# Patient Record
Sex: Male | Born: 1945
Health system: Southern US, Community
[De-identification: ages and names within clinical notes are randomized; demographics above are authoritative.]

## PROBLEM LIST (undated history)

## (undated) DIAGNOSIS — E119 Type 2 diabetes mellitus without complications: Secondary | ICD-10-CM

## (undated) DIAGNOSIS — N289 Disorder of kidney and ureter, unspecified: Secondary | ICD-10-CM

## (undated) DIAGNOSIS — I10 Essential (primary) hypertension: Secondary | ICD-10-CM

## (undated) DIAGNOSIS — M109 Gout, unspecified: Secondary | ICD-10-CM

## (undated) DIAGNOSIS — K219 Gastro-esophageal reflux disease without esophagitis: Secondary | ICD-10-CM

## (undated) DIAGNOSIS — E785 Hyperlipidemia, unspecified: Secondary | ICD-10-CM

## (undated) DIAGNOSIS — E669 Obesity, unspecified: Secondary | ICD-10-CM

## (undated) DIAGNOSIS — I2510 Atherosclerotic heart disease of native coronary artery without angina pectoris: Secondary | ICD-10-CM

## (undated) DIAGNOSIS — C801 Malignant (primary) neoplasm, unspecified: Secondary | ICD-10-CM

## (undated) DIAGNOSIS — N200 Calculus of kidney: Secondary | ICD-10-CM

## (undated) DIAGNOSIS — I700 Atherosclerosis of aorta: Secondary | ICD-10-CM

## (undated) DIAGNOSIS — T7840XA Allergy, unspecified, initial encounter: Secondary | ICD-10-CM

## (undated) HISTORY — DX: Atherosclerosis of aorta: I70.0

## (undated) HISTORY — DX: Calculus of kidney: N20.0

## (undated) HISTORY — DX: Hyperlipidemia, unspecified: E78.5

## (undated) HISTORY — DX: Type 2 diabetes mellitus without complications: E11.9

## (undated) HISTORY — DX: Obesity, unspecified: E66.9

## (undated) HISTORY — DX: Essential (primary) hypertension: I10

## (undated) HISTORY — DX: Allergy, unspecified, initial encounter: T78.40XA

## (undated) HISTORY — DX: Atherosclerotic heart disease of native coronary artery without angina pectoris: I25.10

## (undated) HISTORY — PX: OTHER SURGICAL HISTORY: SHX169

---

## 2005-09-24 ENCOUNTER — Ambulatory Visit: Payer: Self-pay | Admitting: Urology

## 2005-09-30 ENCOUNTER — Ambulatory Visit: Payer: Self-pay | Admitting: Urology

## 2005-10-06 ENCOUNTER — Other Ambulatory Visit: Payer: Self-pay

## 2005-10-06 ENCOUNTER — Ambulatory Visit: Payer: Self-pay | Admitting: Urology

## 2005-10-08 ENCOUNTER — Ambulatory Visit: Payer: Self-pay | Admitting: Urology

## 2005-10-14 ENCOUNTER — Ambulatory Visit: Payer: Self-pay | Admitting: Urology

## 2005-10-21 ENCOUNTER — Ambulatory Visit: Payer: Self-pay | Admitting: Urology

## 2005-10-26 ENCOUNTER — Ambulatory Visit: Payer: Self-pay | Admitting: Urology

## 2005-10-27 ENCOUNTER — Ambulatory Visit: Payer: Self-pay | Admitting: Urology

## 2005-10-29 ENCOUNTER — Ambulatory Visit: Payer: Self-pay | Admitting: Urology

## 2005-11-06 ENCOUNTER — Ambulatory Visit: Payer: Self-pay | Admitting: Urology

## 2005-11-12 ENCOUNTER — Ambulatory Visit: Payer: Self-pay | Admitting: Urology

## 2005-11-19 ENCOUNTER — Ambulatory Visit: Payer: Self-pay | Admitting: Urology

## 2006-01-06 ENCOUNTER — Ambulatory Visit: Payer: Self-pay | Admitting: Urology

## 2006-02-02 ENCOUNTER — Ambulatory Visit: Payer: Self-pay | Admitting: Urology

## 2006-02-16 ENCOUNTER — Ambulatory Visit: Payer: Self-pay | Admitting: Urology

## 2006-02-23 ENCOUNTER — Ambulatory Visit: Payer: Self-pay | Admitting: Urology

## 2006-03-04 ENCOUNTER — Ambulatory Visit: Payer: Self-pay | Admitting: Urology

## 2006-06-07 ENCOUNTER — Ambulatory Visit: Payer: Self-pay | Admitting: Urology

## 2006-09-16 ENCOUNTER — Ambulatory Visit: Payer: Self-pay | Admitting: Urology

## 2006-09-23 ENCOUNTER — Ambulatory Visit: Payer: Self-pay | Admitting: Urology

## 2006-09-27 ENCOUNTER — Ambulatory Visit: Payer: Self-pay | Admitting: Urology

## 2006-10-19 ENCOUNTER — Ambulatory Visit: Payer: Self-pay | Admitting: Urology

## 2006-11-01 ENCOUNTER — Ambulatory Visit: Payer: Self-pay | Admitting: Urology

## 2007-02-02 ENCOUNTER — Ambulatory Visit: Payer: Self-pay | Admitting: Urology

## 2007-08-03 ENCOUNTER — Ambulatory Visit: Payer: Self-pay | Admitting: Urology

## 2008-01-27 ENCOUNTER — Ambulatory Visit: Payer: Self-pay | Admitting: Urology

## 2008-02-08 ENCOUNTER — Ambulatory Visit: Payer: Self-pay | Admitting: Urology

## 2008-02-14 ENCOUNTER — Ambulatory Visit: Payer: Self-pay | Admitting: Urology

## 2008-07-05 ENCOUNTER — Ambulatory Visit: Payer: Self-pay | Admitting: Urology

## 2009-01-07 ENCOUNTER — Ambulatory Visit: Payer: Self-pay | Admitting: Urology

## 2009-07-08 ENCOUNTER — Ambulatory Visit: Payer: Self-pay | Admitting: Urology

## 2009-07-25 ENCOUNTER — Ambulatory Visit: Payer: Self-pay | Admitting: Urology

## 2009-08-01 ENCOUNTER — Ambulatory Visit: Payer: Self-pay | Admitting: Urology

## 2009-12-05 ENCOUNTER — Ambulatory Visit: Payer: Self-pay | Admitting: Urology

## 2010-06-16 ENCOUNTER — Ambulatory Visit: Payer: Self-pay | Admitting: Urology

## 2010-12-31 ENCOUNTER — Ambulatory Visit: Payer: Self-pay | Admitting: Urology

## 2011-07-06 ENCOUNTER — Ambulatory Visit: Payer: Self-pay | Admitting: Urology

## 2011-11-19 ENCOUNTER — Ambulatory Visit: Payer: Self-pay | Admitting: Urology

## 2011-11-19 DIAGNOSIS — N200 Calculus of kidney: Secondary | ICD-10-CM | POA: Insufficient documentation

## 2011-11-19 DIAGNOSIS — N138 Other obstructive and reflux uropathy: Secondary | ICD-10-CM | POA: Insufficient documentation

## 2011-11-19 DIAGNOSIS — N401 Enlarged prostate with lower urinary tract symptoms: Secondary | ICD-10-CM | POA: Insufficient documentation

## 2011-11-19 DIAGNOSIS — Z8042 Family history of malignant neoplasm of prostate: Secondary | ICD-10-CM | POA: Insufficient documentation

## 2011-11-19 DIAGNOSIS — D419 Neoplasm of uncertain behavior of unspecified urinary organ: Secondary | ICD-10-CM | POA: Insufficient documentation

## 2011-11-21 DIAGNOSIS — N23 Unspecified renal colic: Secondary | ICD-10-CM | POA: Insufficient documentation

## 2011-11-21 DIAGNOSIS — N210 Calculus in bladder: Secondary | ICD-10-CM | POA: Insufficient documentation

## 2011-11-21 DIAGNOSIS — R339 Retention of urine, unspecified: Secondary | ICD-10-CM | POA: Insufficient documentation

## 2011-11-21 DIAGNOSIS — R310 Gross hematuria: Secondary | ICD-10-CM | POA: Insufficient documentation

## 2011-11-23 ENCOUNTER — Ambulatory Visit: Payer: Self-pay | Admitting: Urology

## 2011-11-23 DIAGNOSIS — I10 Essential (primary) hypertension: Secondary | ICD-10-CM

## 2011-11-23 LAB — BASIC METABOLIC PANEL
BUN: 29 mg/dL — ABNORMAL HIGH (ref 7–18)
Chloride: 108 mmol/L — ABNORMAL HIGH (ref 98–107)
EGFR (African American): >60
EGFR (Non-African Amer.): >60
Glucose: 106 mg/dL — ABNORMAL HIGH (ref 65–99)
Osmolality: 288 (ref 275–301)
Potassium: 4.2 mmol/L (ref 3.5–5.1)
Sodium: 141 mmol/L (ref 136–145)

## 2011-11-23 LAB — CBC WITH DIFFERENTIAL/PLATELET
Basophil %: 0.7 %
Eosinophil #: 0.2 10*3/uL (ref 0.0–0.7)
Eosinophil %: 2.1 %
HCT: 42.2 % (ref 40.0–52.0)
HGB: 14.3 g/dL (ref 13.0–18.0)
Lymphocyte #: 2.9 10*3/uL (ref 1.0–3.6)
MCH: 31.4 pg (ref 26.0–34.0)
MCHC: 33.9 g/dL (ref 32.0–36.0)
MCV: 93 fL (ref 80–100)
Monocyte #: 0.5 x10 3/mm (ref 0.2–1.0)
Neutrophil #: 4.7 10*3/uL (ref 1.4–6.5)
RDW: 14.7 % — ABNORMAL HIGH (ref 11.5–14.5)

## 2011-11-24 ENCOUNTER — Ambulatory Visit: Payer: Self-pay | Admitting: Urology

## 2012-09-22 ENCOUNTER — Ambulatory Visit: Payer: Self-pay | Admitting: Urology

## 2013-06-16 ENCOUNTER — Ambulatory Visit: Payer: Self-pay | Admitting: Urology

## 2013-06-16 DIAGNOSIS — N182 Chronic kidney disease, stage 2 (mild): Secondary | ICD-10-CM | POA: Insufficient documentation

## 2013-06-26 ENCOUNTER — Ambulatory Visit: Payer: Self-pay | Admitting: Urology

## 2013-07-06 ENCOUNTER — Ambulatory Visit: Payer: Self-pay | Admitting: Urology

## 2013-07-17 ENCOUNTER — Ambulatory Visit: Payer: Self-pay | Admitting: Urology

## 2014-01-04 ENCOUNTER — Ambulatory Visit: Payer: Self-pay | Admitting: Family Medicine

## 2014-01-14 ENCOUNTER — Ambulatory Visit: Payer: Self-pay | Admitting: Family Medicine

## 2014-07-03 NOTE — Op Note (Signed)
PATIENT NAME:  Donald Mcdowell, Donald Mcdowell MR#:  [id number] DATE OF BIRTH:  Aug 09, 1945  DATE OF PROCEDURE:  11/24/2011  PREOPERATIVE DIAGNOSIS: Bladder calculi.   POSTOPERATIVE DIAGNOSIS: Bladder calculi.   PROCEDURES:  1. Cystoscopy. 2. Laser litholapaxy.   SURGEON: Denice Bors. Jacqlyn Larsen, MD   ANESTHESIA: General endotracheal anesthesia.   INDICATIONS: The patient is a 69 year old gentleman with a long history of nephrolithiasis. He was noted to have several stones in the left kidney at the time of his previous evaluation. He recently began experiencing left-sided flank pain with increased urinary frequency and hematuria. A subsequent KUB demonstrated two stones within the urinary bladder. Both were approximately 1 cm in size. He presents for cysto and laser litholapaxy.   DESCRIPTION OF PROCEDURE: After informed consent was obtained, the patient was taken to the operating room and placed in the dorsal lithotomy position under general endotracheal anesthesia. The patient was then prepped and draped in the usual standard fashion. The 52 French rigid cystoscope was introduced into the urethra under direct vision with no urethral abnormalities noted. Moderate bilobar prostatic hypertrophy was noted with small median lobe formation. Upon entering the bladder, two stones were visualized, one approximately 1.2 cm in size; the other was approximately 1 cm in size. Both were on the bladder base. The left ureteral orifice was noted to be dilated and erythematous consistent with recent stone passage. The right ureteral orifice was normal in appearance. The holmium laser fiber was then utilized to fragment the stone into multiple smaller pieces. These were then irrigated free from the bladder. Once the stone fragments were completely evacuated, the bladder was drained. The cystoscope was removed. The patient was returned to the supine position and awakened from general endotracheal anesthesia. He was taken to the recovery room  in stable condition. There were no problems or complications.   The patient tolerated the procedure well. The stones were collected and will be sent for stone analysis.   ____________________________ Denice Bors Jacqlyn Larsen, MD bsc:drc D: 11/24/2011 12:51:05 ET T: 11/24/2011 13:01:29 ET JOB#: [id number]  cc: Denice Bors. Jacqlyn Larsen, MD, <Dictator> Denice Bors Shayla Heming MD ELECTRONICALLY SIGNED 11/26/2011 8:08

## 2014-09-12 DIAGNOSIS — N201 Calculus of ureter: Secondary | ICD-10-CM | POA: Insufficient documentation

## 2014-11-26 ENCOUNTER — Ambulatory Visit (INDEPENDENT_AMBULATORY_CARE_PROVIDER_SITE_OTHER): Payer: Medicare Other | Admitting: Family Medicine

## 2014-11-26 ENCOUNTER — Encounter: Payer: Self-pay | Admitting: Family Medicine

## 2014-11-26 VITALS — BP 130/70 | HR 110 | Temp 99.3°F | Resp 18 | Ht 71.0 in | Wt 290.5 lb

## 2014-11-26 DIAGNOSIS — N410 Acute prostatitis: Secondary | ICD-10-CM | POA: Diagnosis not present

## 2014-11-26 DIAGNOSIS — R300 Dysuria: Secondary | ICD-10-CM | POA: Diagnosis not present

## 2014-11-26 LAB — POCT URINALYSIS DIPSTICK
Bilirubin, UA: NEGATIVE
Glucose, UA: NEGATIVE
NITRITE UA: NEGATIVE
PH UA: 5
Spec Grav, UA: 1.015
UROBILINOGEN UA: 0.2

## 2014-11-26 MED ORDER — CIPROFLOXACIN HCL 500 MG PO TABS: 500.0000 mg | ORAL_TABLET | Freq: Two times a day (BID) | ORAL | Status: DC

## 2014-11-26 NOTE — Patient Instructions (Signed)

## 2014-11-26 NOTE — Progress Notes (Signed)
Name: Donald Mcdowell.   MRN: [id number]    DOB: May 28, 1945   Date:11/26/2014       Progress Note  Subjective  Chief Complaint  Chief Complaint  Patient presents with  . Urinary Frequency    burning since Friday. Just had kidney stone procedure with Dr. Jacqlyn Larsen on July 2016    Urinary Frequency  Associated symptoms include flank pain, frequency and urgency. Pertinent negatives include no chills, hematuria, nausea or vomiting.   Urinary frequency over the past several days along with every urination which is worsening. He is also having some problem with incontinence and dysuria as well as burning in the perineal area. There's been no documented fever or chills. There is no history of pyelonephritis. There is a history of numerous renal calculi as well as a mass in the bladder as well as prostate stones. He seen urologist on a regular basis this last surgical procedure or urological procedure was done by the urologist [id number]. He is also diabetic and has not been following up regularly for diabetic rechecks  Past Medical History  Diagnosis Date  . Allergy   . Diabetes mellitus without complication   . Hyperlipidemia   . Hypertension   . Kidney stones     Social History  Substance Use Topics  . Smoking status: Former Research scientist (life sciences)  . Smokeless tobacco: Not on file  . Alcohol Use: No     Current outpatient prescriptions:  .  allopurinol (ZYLOPRIM) 300 MG tablet, Take 300 mg by mouth., Disp: , Rfl:  .  glipiZIDE (GLUCOTROL XL) 10 MG 24 hr tablet, Take 10 mg by mouth., Disp: , Rfl:  .  hydrochlorothiazide (HYDRODIURIL) 25 MG tablet, Take 25 mg by mouth., Disp: , Rfl:  .  metFORMIN (GLUCOPHAGE) 1000 MG tablet, Take 1,000 mg by mouth., Disp: , Rfl:  .  quinapril (ACCUPRIL) 20 MG tablet, Take 20 mg by mouth., Disp: , Rfl:  .  rosuvastatin (CRESTOR) 20 MG tablet, Take 20 mg by mouth., Disp: , Rfl:  .  tamsulosin (FLOMAX) 0.4 MG CAPS capsule, Take 0.4 mg by mouth., Disp: , Rfl:  .  tiotropium  (SPIRIVA) 18 MCG inhalation capsule, Place 18 mcg into inhaler and inhale daily., Disp: , Rfl:   No Known Allergies  Review of Systems  Constitutional: Positive for malaise/fatigue. Negative for fever, chills and weight loss.  HENT: Negative for congestion, hearing loss, sore throat and tinnitus.   Eyes: Negative for blurred vision, double vision and redness.  Respiratory: Negative for cough, hemoptysis and shortness of breath.   Cardiovascular: Negative for chest pain, palpitations, orthopnea, claudication and leg swelling.  Gastrointestinal: Positive for abdominal pain. Negative for heartburn, nausea, vomiting, diarrhea, constipation and blood in stool.  Genitourinary: Positive for dysuria, urgency, frequency and flank pain. Negative for hematuria.  Musculoskeletal: Negative for myalgias, back pain, joint pain, falls and neck pain.  Skin: Negative for itching.  Neurological: Positive for weakness. Negative for dizziness, tingling, tremors, focal weakness, seizures, loss of consciousness and headaches.  Endo/Heme/Allergies: Does not bruise/bleed easily.  Psychiatric/Behavioral: Negative for depression and substance abuse. The patient is not nervous/anxious and does not have insomnia.      Objective  Filed Vitals:   11/26/14 1333  BP: 130/70  Pulse: 110  Temp: 99.3 F (37.4 C)  TempSrc: Oral  Resp: 18  Height: '5\' 11"'$  (1.803 m)  Weight: 290 lb 8 oz (131.77 kg)  SpO2: 93%     Physical Exam  Constitutional: He is oriented  to person, place, and time and well-developed, well-nourished, and in no distress.  Morbidly obese and in modest distress  HENT:  Head: Normocephalic.  Eyes: EOM are normal. Pupils are equal, round, and reactive to light.  Neck: Normal range of motion. Neck supple. No thyromegaly present.  Cardiovascular: Normal rate, regular rhythm and normal heart sounds.   No murmur heard. Pulmonary/Chest: Effort normal. No respiratory distress. He has wheezes.  Wheezes  noted particularly at the bases with expiration  Abdominal: Soft. Bowel sounds are normal.  Genitourinary: Penis normal.  Prostate is modestly enlarged and warm to touch and tender.  Musculoskeletal: Normal range of motion. He exhibits no edema.  Lymphadenopathy:    He has no cervical adenopathy.  Neurological: He is alert and oriented to person, place, and time. No cranial nerve deficit. Gait normal. Coordination normal.  Skin: Skin is warm and dry. No rash noted.  Psychiatric: Affect and judgment normal.      Assessment & Plan    1. Acute prostatitis Cipro 500 mg twice a day  2. Dysuria  - POCT urinalysis dipstick - Urine Culture

## 2014-11-27 LAB — URINE CULTURE: Organism ID, Bacteria: NO GROWTH

## 2014-11-28 ENCOUNTER — Telehealth: Payer: Self-pay | Admitting: Emergency Medicine

## 2014-11-28 NOTE — Telephone Encounter (Signed)
Patient notified

## 2014-12-07 ENCOUNTER — Telehealth: Payer: Self-pay | Admitting: Family Medicine

## 2014-12-07 NOTE — Telephone Encounter (Signed)
Patient is needing authorization to get a refill on Cipro '500mg'$ . Please send 2week supply to [facility]

## 2014-12-10 ENCOUNTER — Other Ambulatory Visit: Payer: Self-pay | Admitting: Family Medicine

## 2014-12-11 ENCOUNTER — Ambulatory Visit: Payer: Medicare Other | Admitting: Family Medicine

## 2014-12-18 ENCOUNTER — Encounter: Payer: Self-pay | Admitting: Family Medicine

## 2014-12-18 ENCOUNTER — Ambulatory Visit (INDEPENDENT_AMBULATORY_CARE_PROVIDER_SITE_OTHER): Payer: Medicare Other | Admitting: Family Medicine

## 2014-12-18 VITALS — BP 118/68 | HR 109 | Temp 98.4°F | Resp 16 | Ht 71.0 in | Wt 290.0 lb

## 2014-12-18 DIAGNOSIS — E1129 Type 2 diabetes mellitus with other diabetic kidney complication: Secondary | ICD-10-CM | POA: Diagnosis not present

## 2014-12-18 DIAGNOSIS — I10 Essential (primary) hypertension: Secondary | ICD-10-CM | POA: Diagnosis not present

## 2014-12-18 DIAGNOSIS — E785 Hyperlipidemia, unspecified: Secondary | ICD-10-CM

## 2014-12-18 DIAGNOSIS — N3000 Acute cystitis without hematuria: Secondary | ICD-10-CM

## 2014-12-18 DIAGNOSIS — Z23 Encounter for immunization: Secondary | ICD-10-CM | POA: Diagnosis not present

## 2014-12-18 LAB — POCT URINALYSIS DIPSTICK
BILIRUBIN UA: NEGATIVE
Glucose, UA: NEGATIVE
Ketones, UA: POSITIVE
LEUKOCYTES UA: NEGATIVE
NITRITE UA: NEGATIVE
PH UA: 5
RBC UA: NEGATIVE
Spec Grav, UA: 1.01
UROBILINOGEN UA: NEGATIVE

## 2014-12-18 LAB — GLUCOSE, POCT (MANUAL RESULT ENTRY): POC GLUCOSE: 131 mg/dL — AB (ref 70–99)

## 2014-12-18 LAB — POCT GLYCOSYLATED HEMOGLOBIN (HGB A1C): HEMOGLOBIN A1C: 6.6

## 2014-12-18 LAB — POCT UA - MICROALBUMIN: Microalbumin Ur, POC: 20 mg/L

## 2014-12-18 NOTE — Progress Notes (Signed)
Name: Donald Mcdowell.   MRN: [id number]    DOB: 1945/12/21   Date:12/18/2014       Progress Note  Subjective  Chief Complaint  Chief Complaint  Patient presents with  . Diabetes    HPI  Diabetes  Patient presents for follow-up of diabetes which is present for over 5 years. Is currently on a regimen of over 5 years. Patient states intermittent compliance with their diet and exercise. There's been no hypoglycemic episodes and there is no polyuria polydipsia polyphagia. His average fasting glucoses been in the low around 120 to 1:30 with a high around 150 . There is no end organ disease.  Last diabetic eye exam was less than a year ago.   Last visit with dietitian was greater than a year ago. Last microalbumin was obtained today and is normal at 20 .   Hypertension   Patient presents for follow-up of hypertension. It has been present for over over 5 years.  Patient states that there is compliance with medical regimen which consists of quinapril 20 mg daily and hydrochlorothiazide 25 mg daily . There is no end organ disease. Cardiac risk factors include hypertension hyperlipidemia and diabetes.  Exercise regimen consist of minimal .  Diet consist of noncompliance .  Hyperlipidemia  Patient has a history of hyperlipidemia for over 5 years.  Current medical regimen consist of Crestor 20 mg daily at bedtime .  Compliance is good .  Diet and exercise are currently followed poorly .  Risk factors for cardiovascular disease include hyperlipidemia , hypertension, diabetes, obesity, sedentary lifestyle .   There have been no side effects from the medication.    COPD history of present illness  Obesity  Patient has a history of obesity for many many years.  Attempts at weight loss have included diet and exercise and these have been followed. .  Results of this regimen  have been not very effective .  Patient now voices and interest in weight loss by  nothing .     Past Medical History   Diagnosis Date  . Allergy   . Diabetes mellitus without complication ([place])   . Hyperlipidemia   . Hypertension   . Kidney stones     Social History  Substance Use Topics  . Smoking status: Former Research scientist (life sciences)  . Smokeless tobacco: Not on file  . Alcohol Use: No     Current outpatient prescriptions:  .  allopurinol (ZYLOPRIM) 300 MG tablet, Take 300 mg by mouth., Disp: , Rfl:  .  ciprofloxacin (CIPRO) 500 MG tablet, TAKE ONE TABLET BY MOUTH TWICE DAILY, Disp: 28 tablet, Rfl: 0 .  glipiZIDE (GLUCOTROL XL) 10 MG 24 hr tablet, Take 10 mg by mouth., Disp: , Rfl:  .  hydrochlorothiazide (HYDRODIURIL) 25 MG tablet, Take 25 mg by mouth., Disp: , Rfl:  .  metFORMIN (GLUCOPHAGE) 1000 MG tablet, Take 1,000 mg by mouth., Disp: , Rfl:  .  quinapril (ACCUPRIL) 20 MG tablet, Take 20 mg by mouth., Disp: , Rfl:  .  rosuvastatin (CRESTOR) 20 MG tablet, Take 20 mg by mouth., Disp: , Rfl:  .  tamsulosin (FLOMAX) 0.4 MG CAPS capsule, Take 0.4 mg by mouth., Disp: , Rfl:  .  tiotropium (SPIRIVA) 18 MCG inhalation capsule, Place 18 mcg into inhaler and inhale daily., Disp: , Rfl:   No Known Allergies  Review of Systems  Constitutional: Negative for fever, chills and weight loss.  HENT: Positive for congestion. Negative for hearing loss, sore throat  and tinnitus.   Eyes: Negative for blurred vision, double vision and redness.  Respiratory: Positive for cough and wheezing. Negative for hemoptysis and shortness of breath.   Cardiovascular: Negative for chest pain, palpitations, orthopnea, claudication and leg swelling.  Gastrointestinal: Negative for heartburn, nausea, vomiting, diarrhea, constipation and blood in stool.  Genitourinary: Negative for dysuria, urgency, frequency and hematuria.       Recurrent renal stones for which she is followed by urologist  Musculoskeletal: Positive for joint pain. Negative for myalgias, back pain, falls and neck pain.  Skin: Negative for itching.  Neurological: Negative  for dizziness, tingling, tremors, focal weakness, seizures, loss of consciousness, weakness and headaches.  Endo/Heme/Allergies: Does not bruise/bleed easily.  Psychiatric/Behavioral: Negative for depression and substance abuse. The patient is nervous/anxious. The patient does not have insomnia.      Objective  Filed Vitals:   12/18/14 0859  BP: 118/68  Pulse: 109  Temp: 98.4 F (36.9 C)  Resp: 16  Height: '5\' 11"'$  (1.803 m)  Weight: 290 lb (131.543 kg)  SpO2: 96%     Physical Exam  Constitutional: He is oriented to person, place, and time and well-developed, well-nourished, and in no distress.  HENT:  Head: Normocephalic.  Eyes: EOM are normal. Pupils are equal, round, and reactive to light.  Neck: Normal range of motion. Neck supple. No thyromegaly present.  Cardiovascular: Normal rate, regular rhythm and normal heart sounds.   No murmur heard. Pulmonary/Chest: Effort normal and breath sounds normal. No respiratory distress. He has no wheezes.  Abdominal: Soft. Bowel sounds are normal.  Musculoskeletal: Normal range of motion. He exhibits no edema.  Lymphadenopathy:    He has no cervical adenopathy.  Neurological: He is alert and oriented to person, place, and time. No cranial nerve deficit. Gait normal. Coordination normal.  Skin: Skin is warm and dry. No rash noted.  Psychiatric: Affect and judgment normal.      Assessment & Plan  1. Type 2 diabetes mellitus with other kidney complication (HCC) Stable - POCT Glucose (CBG) - POCT HgB A1C - POCT UA - Microalbumin - Comprehensive Metabolic Panel (CMET) - Lipid Profile - TSH  2. Need for influenza vaccination Given today - Flu vaccine HIGH DOSE PF (Fluzone High dose)  3. Acute cystitis without hematuria Followed by urologist - POCT urinalysis dipstick  4. Essential hypertension Well-controlled  5. Hyperlipidemia Check lipid panel, metabolic panel  6. Morbid obesity due to excess calories  ([place])  Encourage diet and exercise with dietitian

## 2014-12-26 LAB — COMPREHENSIVE METABOLIC PANEL
ALK PHOS: 51 IU/L (ref 39–117)
ALT: 35 IU/L (ref 0–44)
AST: 25 IU/L (ref 0–40)
Albumin/Globulin Ratio: 1.7 (ref 1.1–2.5)
Albumin: 4.3 g/dL (ref 3.6–4.8)
BUN/Creatinine Ratio: 18 (ref 10–22)
BUN: 20 mg/dL (ref 8–27)
Bilirubin Total: 0.3 mg/dL (ref 0.0–1.2)
CO2: 26 mmol/L (ref 18–29)
CREATININE: 1.11 mg/dL (ref 0.76–1.27)
Calcium: 9.6 mg/dL (ref 8.6–10.2)
Chloride: 98 mmol/L (ref 97–108)
GFR calc Af Amer: 78 mL/min/{1.73_m2} (ref >59)
GFR calc non Af Amer: 68 mL/min/{1.73_m2} (ref >59)
GLOBULIN, TOTAL: 2.5 g/dL (ref 1.5–4.5)
GLUCOSE: 170 mg/dL — AB (ref 65–99)
Potassium: 5 mmol/L (ref 3.5–5.2)
SODIUM: 138 mmol/L (ref 134–144)
Total Protein: 6.8 g/dL (ref 6.0–8.5)

## 2014-12-26 LAB — LIPID PANEL
CHOLESTEROL TOTAL: 132 mg/dL (ref 100–199)
Chol/HDL Ratio: 3.1 ratio units (ref 0.0–5.0)
HDL: 42 mg/dL (ref >39)
LDL CALC: 67 mg/dL (ref 0–99)
TRIGLYCERIDES: 115 mg/dL (ref 0–149)
VLDL Cholesterol Cal: 23 mg/dL (ref 5–40)

## 2014-12-26 LAB — TSH: TSH: 1.94 u[IU]/mL (ref 0.450–4.500)

## 2014-12-28 ENCOUNTER — Telehealth: Payer: Self-pay | Admitting: Emergency Medicine

## 2014-12-28 NOTE — Telephone Encounter (Signed)
Patient notified of lab results

## 2015-02-12 ENCOUNTER — Other Ambulatory Visit: Payer: Self-pay | Admitting: Family Medicine

## 2015-04-24 ENCOUNTER — Ambulatory Visit: Payer: Medicare Other | Admitting: Family Medicine

## 2015-05-15 DIAGNOSIS — E119 Type 2 diabetes mellitus without complications: Secondary | ICD-10-CM | POA: Diagnosis not present

## 2015-05-16 ENCOUNTER — Telehealth: Payer: Self-pay | Admitting: Family Medicine

## 2015-05-16 MED ORDER — METFORMIN HCL 1000 MG PO TABS: 1000.0000 mg | ORAL_TABLET | Freq: Two times a day (BID) | ORAL | Status: DC

## 2015-05-16 MED ORDER — QUINAPRIL HCL 20 MG PO TABS: 20.0000 mg | ORAL_TABLET | Freq: Every day | ORAL | Status: DC

## 2015-05-16 MED ORDER — GLIPIZIDE ER 10 MG PO TB24: 10.0000 mg | ORAL_TABLET | Freq: Two times a day (BID) | ORAL | Status: DC

## 2015-05-16 MED ORDER — ALLOPURINOL 300 MG PO TABS: 300.0000 mg | ORAL_TABLET | Freq: Every day | ORAL | Status: DC

## 2015-05-16 NOTE — Telephone Encounter (Signed)
Medications have been refilled and sent to pahrmacy

## 2015-05-16 NOTE — Telephone Encounter (Signed)
We had to resch appointment because provider will be out of the office. Please refill all medications and send to [facility]. We had to resch appointment for May

## 2015-05-16 NOTE — Telephone Encounter (Signed)
Patient is needing refills on allopurinol, quinapril, metformin, & glipizide

## 2015-05-17 NOTE — Telephone Encounter (Signed)
Patient informed. 

## 2015-05-21 ENCOUNTER — Ambulatory Visit: Payer: Medicare Other | Admitting: Family Medicine

## 2015-07-16 ENCOUNTER — Ambulatory Visit: Payer: Medicare Other | Admitting: Family Medicine

## 2015-08-13 ENCOUNTER — Other Ambulatory Visit: Payer: Self-pay | Admitting: Family Medicine

## 2015-08-16 ENCOUNTER — Telehealth: Payer: Self-pay | Admitting: Family Medicine

## 2015-08-16 DIAGNOSIS — N210 Calculus in bladder: Secondary | ICD-10-CM | POA: Diagnosis not present

## 2015-08-16 DIAGNOSIS — Z8042 Family history of malignant neoplasm of prostate: Secondary | ICD-10-CM | POA: Diagnosis not present

## 2015-08-16 DIAGNOSIS — N401 Enlarged prostate with lower urinary tract symptoms: Secondary | ICD-10-CM | POA: Diagnosis not present

## 2015-08-16 DIAGNOSIS — R339 Retention of urine, unspecified: Secondary | ICD-10-CM | POA: Diagnosis not present

## 2015-08-16 DIAGNOSIS — N200 Calculus of kidney: Secondary | ICD-10-CM | POA: Diagnosis not present

## 2015-08-16 NOTE — Telephone Encounter (Signed)
Pt needs refills on all his medication. phamr is [facility] in [place]. Getting pt appt with other provider

## 2015-08-19 MED ORDER — GLIPIZIDE ER 10 MG PO TB24: 10.0000 mg | ORAL_TABLET | Freq: Two times a day (BID) | ORAL | Status: DC

## 2015-08-19 MED ORDER — ALLOPURINOL 300 MG PO TABS: 300.0000 mg | ORAL_TABLET | Freq: Every day | ORAL | Status: DC

## 2015-08-19 MED ORDER — METFORMIN HCL 1000 MG PO TABS: 1000.0000 mg | ORAL_TABLET | Freq: Two times a day (BID) | ORAL | Status: DC

## 2015-08-19 MED ORDER — QUINAPRIL HCL 20 MG PO TABS: 20.0000 mg | ORAL_TABLET | Freq: Every day | ORAL | Status: DC

## 2015-08-19 NOTE — Telephone Encounter (Signed)
I am only allowed to send a 30 day supply of medications to patients to hold them until they see another provider. Please inform all patients of this. I have sent a 30 day supply of his medication to his pharmacy.

## 2015-08-20 ENCOUNTER — Ambulatory Visit (INDEPENDENT_AMBULATORY_CARE_PROVIDER_SITE_OTHER): Payer: Medicare Other | Admitting: Family Medicine

## 2015-08-20 ENCOUNTER — Encounter: Payer: Self-pay | Admitting: Family Medicine

## 2015-08-20 ENCOUNTER — Other Ambulatory Visit: Payer: Self-pay | Admitting: Emergency Medicine

## 2015-08-20 VITALS — BP 120/64 | HR 112 | Temp 99.0°F | Resp 20 | Ht 71.0 in | Wt 293.1 lb

## 2015-08-20 DIAGNOSIS — N401 Enlarged prostate with lower urinary tract symptoms: Secondary | ICD-10-CM

## 2015-08-20 DIAGNOSIS — E0821 Diabetes mellitus due to underlying condition with diabetic nephropathy: Secondary | ICD-10-CM

## 2015-08-20 DIAGNOSIS — M109 Gout, unspecified: Secondary | ICD-10-CM | POA: Insufficient documentation

## 2015-08-20 DIAGNOSIS — E1121 Type 2 diabetes mellitus with diabetic nephropathy: Secondary | ICD-10-CM | POA: Insufficient documentation

## 2015-08-20 DIAGNOSIS — E669 Obesity, unspecified: Secondary | ICD-10-CM

## 2015-08-20 DIAGNOSIS — E559 Vitamin D deficiency, unspecified: Secondary | ICD-10-CM

## 2015-08-20 DIAGNOSIS — N182 Chronic kidney disease, stage 2 (mild): Secondary | ICD-10-CM | POA: Diagnosis not present

## 2015-08-20 DIAGNOSIS — N138 Other obstructive and reflux uropathy: Secondary | ICD-10-CM

## 2015-08-20 DIAGNOSIS — M1000 Idiopathic gout, unspecified site: Secondary | ICD-10-CM

## 2015-08-20 HISTORY — DX: Obesity, unspecified: E66.9

## 2015-08-20 LAB — POCT GLYCOSYLATED HEMOGLOBIN (HGB A1C): Hemoglobin A1C: 6.8

## 2015-08-20 MED ORDER — GLIPIZIDE ER 10 MG PO TB24: 10.0000 mg | ORAL_TABLET | Freq: Two times a day (BID) | ORAL | Status: DC

## 2015-08-20 MED ORDER — QUINAPRIL HCL 20 MG PO TABS: 20.0000 mg | ORAL_TABLET | Freq: Every day | ORAL | Status: DC

## 2015-08-20 MED ORDER — ALLOPURINOL 300 MG PO TABS: 300.0000 mg | ORAL_TABLET | Freq: Every day | ORAL | Status: DC

## 2015-08-20 MED ORDER — METFORMIN HCL 1000 MG PO TABS: 1000.0000 mg | ORAL_TABLET | Freq: Two times a day (BID) | ORAL | Status: DC

## 2015-08-20 MED ORDER — ROSUVASTATIN CALCIUM 20 MG PO TABS: 20.0000 mg | ORAL_TABLET | Freq: Every day | ORAL | Status: DC

## 2015-08-20 NOTE — Progress Notes (Signed)
Date:  08/20/2015   Name:  Donald Mcdowell.   DOB:  Sep 03, 1945   MRN:  [id number]  PCP:  Ashok Norris, MD    Chief Complaint: Medication Refill   History of Present Illness:  This is a 70 y.o. male requesting refills of chronic meds. Gets Flomax from urology. On Crestor for HLD, LDL 67 in October. On allopurinol for gout, no recent uric acid level. On glipizide XL and metformin for T2DM, tests BG's 5 times daily and adjusts eating to control. No recent MCR noted.   Review of Systems:  Review of Systems  Constitutional: Negative for fever and fatigue.  Respiratory: Negative for cough and shortness of breath.   Cardiovascular: Negative for chest pain and leg swelling.  Endocrine: Negative for polyuria.  Genitourinary: Negative for difficulty urinating.  Neurological: Negative for syncope and light-headedness.    Patient Active Problem List   Diagnosis Date Noted  . Diabetes mellitus due to underlying condition, controlled, with diabetic nephropathy ([place]) 08/20/2015  . Severe obesity (BMI >= 40) ([place]) 08/20/2015  . Gout 08/20/2015  . Calculi, ureter 09/12/2014  . Chronic kidney disease (CKD), stage II (mild) 06/16/2013  . Bladder calculi 11/21/2011  . Frank hematuria 11/21/2011  . Incomplete bladder emptying 11/21/2011  . Renal colic 21/19/4174  . Benign prostatic hyperplasia with urinary obstruction 11/19/2011  . Calculus of kidney 11/19/2011  . Acalcerosis 11/19/2011  . Family history of malignant neoplasm of prostate 11/19/2011  . 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 Authorizing Provider  allopurinol (ZYLOPRIM) 300 MG tablet Take 1 tablet (300 mg total) by mouth daily. 08/20/15   Adline Potter, MD  glipiZIDE (GLUCOTROL XL) 10 MG 24 hr tablet Take 1 tablet (10 mg total) by mouth 2 (two) times daily. 08/20/15   Adline Potter, MD  metFORMIN (GLUCOPHAGE) 1000 MG tablet Take 1  tablet (1,000 mg total) by mouth 2 (two) times daily. 08/20/15   Adline Potter, MD  quinapril (ACCUPRIL) 20 MG tablet Take 1 tablet (20 mg total) by mouth daily. 08/20/15   Adline Potter, MD  rosuvastatin (CRESTOR) 20 MG tablet Take 1 tablet (20 mg total) by mouth daily. 08/20/15   Adline Potter, MD  tamsulosin (FLOMAX) 0.4 MG CAPS capsule Take 0.4 mg by mouth daily. 10/02/14   Historical Provider, MD    No Known Allergies  Past Surgical History  Procedure Laterality Date  . Kidney stones      Social History  Substance Use Topics  . Smoking status: Former Research scientist (life sciences)  . Smokeless tobacco: None  . Alcohol Use: No    No family history on file.  Medication list has been reviewed and updated.  Physical Examination: BP 120/64 mmHg  Pulse 112  Temp(Src) 99 F (37.2 C)  Resp 20  Ht '5\' 11"'$  (1.803 m)  Wt 293 lb 2 oz (132.961 kg)  BMI 40.90 kg/m2  SpO2 95%  Physical Exam  Constitutional: He appears well-developed and well-nourished.  Cardiovascular: Normal rate, regular rhythm and normal heart sounds.   Pulmonary/Chest: Effort normal and breath sounds normal.  Musculoskeletal: He exhibits no edema.  Neurological: He is alert.  Skin: Skin is warm and dry.  Psychiatric: He has a normal mood and affect. His behavior is normal.  Nursing note and vitals reviewed.   Assessment and Plan:  1. Diabetes mellitus due to underlying condition, controlled, with diabetic nephropathy, without long-term current use  of insulin (HCC) A1c 6.8%, refill metformin/glipizide/test strips - POCT HgB A1C - Comprehensive Metabolic Panel (CMET) - Urine Microalbumin w/creat. ratio - B12  2. Benign prostatic hyperplasia with urinary obstruction Cont Flomax, followed by urology  3. Chronic kidney disease (CKD), stage II (mild) Refill quinapril  4. Morbid obesity, unspecified obesity type ([place]) - TSH - Vitamin D (25 hydroxy)  5. Idiopathic gout, unspecified chronicity, unspecified site Refill allopurinol,  consider uric acid level next visit  Return in about 3 months (around 11/20/2015).  Satira Anis. [facility]  08/20/2015

## 2015-09-02 ENCOUNTER — Ambulatory Visit: Payer: Medicare Other | Admitting: Family Medicine

## 2015-09-03 DIAGNOSIS — E0821 Diabetes mellitus due to underlying condition with diabetic nephropathy: Secondary | ICD-10-CM | POA: Diagnosis not present

## 2015-09-03 DIAGNOSIS — Z794 Long term (current) use of insulin: Secondary | ICD-10-CM | POA: Diagnosis not present

## 2015-09-04 DIAGNOSIS — E559 Vitamin D deficiency, unspecified: Secondary | ICD-10-CM | POA: Insufficient documentation

## 2015-09-04 LAB — TSH: TSH: 1.65 u[IU]/mL (ref 0.450–4.500)

## 2015-09-04 LAB — COMPREHENSIVE METABOLIC PANEL
ALK PHOS: 58 IU/L (ref 39–117)
ALT: 25 IU/L (ref 0–44)
AST: 19 IU/L (ref 0–40)
Albumin/Globulin Ratio: 1.7 (ref 1.2–2.2)
Albumin: 4.3 g/dL (ref 3.6–4.8)
BUN/Creatinine Ratio: 22 (ref 10–24)
BUN: 22 mg/dL (ref 8–27)
Bilirubin Total: 0.3 mg/dL (ref 0.0–1.2)
CALCIUM: 9.6 mg/dL (ref 8.6–10.2)
CO2: 23 mmol/L (ref 18–29)
CREATININE: 1 mg/dL (ref 0.76–1.27)
Chloride: 101 mmol/L (ref 96–106)
GFR calc Af Amer: 88 mL/min/{1.73_m2} (ref >59)
GFR, EST NON AFRICAN AMERICAN: 76 mL/min/{1.73_m2} (ref >59)
GLOBULIN, TOTAL: 2.5 g/dL (ref 1.5–4.5)
GLUCOSE: 145 mg/dL — AB (ref 65–99)
Potassium: 4.7 mmol/L (ref 3.5–5.2)
Sodium: 142 mmol/L (ref 134–144)
Total Protein: 6.8 g/dL (ref 6.0–8.5)

## 2015-09-04 LAB — VITAMIN D 25 HYDROXY (VIT D DEFICIENCY, FRACTURES): Vit D, 25-Hydroxy: 28.6 ng/mL — ABNORMAL LOW (ref 30.0–100.0)

## 2015-09-04 LAB — VITAMIN B12: VITAMIN B 12: 670 pg/mL (ref 211–946)

## 2015-09-04 MED ORDER — VITAMIN D (CHOLECALCIFEROL) 25 MCG (1000 UT) PO CAPS: 1.0000 | ORAL_CAPSULE | Freq: Every day | ORAL | Status: DC

## 2015-09-04 NOTE — Addendum Note (Signed)
Addended by: Adline Potter on: 09/04/2015 09:13 AM   Modules accepted: Orders

## 2015-09-07 DIAGNOSIS — E119 Type 2 diabetes mellitus without complications: Secondary | ICD-10-CM | POA: Diagnosis not present

## 2015-09-09 ENCOUNTER — Telehealth: Payer: Self-pay | Admitting: Family Medicine

## 2015-09-09 MED ORDER — GLUCOSE BLOOD VI STRP: ORAL_STRIP | Status: DC

## 2015-09-09 NOTE — Telephone Encounter (Signed)
Contacted pharmacy and refills have been sent. Please inform pt.

## 2015-09-09 NOTE — Telephone Encounter (Signed)
Pt said that when he went to get his test strips refilled there was no refills and the rx was written only for 30 day supply and it usually gets 1 yr supply . Pharm is [facility] in [place],

## 2015-09-09 NOTE — Telephone Encounter (Signed)
Don't see test strips on med list, please call pharmacy and renew over phone x 1 year

## 2015-12-03 ENCOUNTER — Other Ambulatory Visit: Payer: Self-pay

## 2015-12-03 DIAGNOSIS — E0821 Diabetes mellitus due to underlying condition with diabetic nephropathy: Secondary | ICD-10-CM | POA: Diagnosis not present

## 2015-12-03 MED ORDER — GLUCOSE BLOOD VI STRP: ORAL_STRIP | 0 refills | Status: DC

## 2015-12-03 NOTE — Telephone Encounter (Signed)
Please let patient know that I'm decreasing the number of times for him to check his finger stick blood sugar to just once a day until I see him When I see him soon, we'll talk about stopping the finger stick blood sugars altogether Please bring all glucose readings to appt for [place] to review together Thank you

## 2015-12-03 NOTE — Telephone Encounter (Signed)
Pt wife notified.

## 2015-12-19 ENCOUNTER — Other Ambulatory Visit: Payer: Self-pay | Admitting: Family Medicine

## 2015-12-19 NOTE — Telephone Encounter (Signed)
Too soon for FSBS; I just approved 100 in Sept

## 2015-12-23 ENCOUNTER — Ambulatory Visit (INDEPENDENT_AMBULATORY_CARE_PROVIDER_SITE_OTHER): Payer: Medicare Other | Admitting: Family Medicine

## 2015-12-23 ENCOUNTER — Encounter: Payer: Self-pay | Admitting: Family Medicine

## 2015-12-23 VITALS — BP 116/58 | HR 99 | Temp 97.7°F | Resp 16 | Wt 285.0 lb

## 2015-12-23 DIAGNOSIS — N210 Calculus in bladder: Secondary | ICD-10-CM

## 2015-12-23 DIAGNOSIS — N182 Chronic kidney disease, stage 2 (mild): Secondary | ICD-10-CM

## 2015-12-23 DIAGNOSIS — Z6839 Body mass index (BMI) 39.0-39.9, adult: Secondary | ICD-10-CM | POA: Diagnosis not present

## 2015-12-23 DIAGNOSIS — E6609 Other obesity due to excess calories: Secondary | ICD-10-CM

## 2015-12-23 DIAGNOSIS — E785 Hyperlipidemia, unspecified: Secondary | ICD-10-CM

## 2015-12-23 DIAGNOSIS — M1000 Idiopathic gout, unspecified site: Secondary | ICD-10-CM

## 2015-12-23 DIAGNOSIS — E0821 Diabetes mellitus due to underlying condition with diabetic nephropathy: Secondary | ICD-10-CM | POA: Diagnosis not present

## 2015-12-23 DIAGNOSIS — Z23 Encounter for immunization: Secondary | ICD-10-CM | POA: Diagnosis not present

## 2015-12-23 DIAGNOSIS — Z5181 Encounter for therapeutic drug level monitoring: Secondary | ICD-10-CM | POA: Diagnosis not present

## 2015-12-23 DIAGNOSIS — IMO0001 Reserved for inherently not codable concepts without codable children: Secondary | ICD-10-CM

## 2015-12-23 LAB — POCT GLYCOSYLATED HEMOGLOBIN (HGB A1C): HEMOGLOBIN A1C: 6.3

## 2015-12-23 MED ORDER — GLIPIZIDE ER 10 MG PO TB24: 10.0000 mg | ORAL_TABLET | Freq: Every day | ORAL | 1 refills | Status: DC

## 2015-12-23 NOTE — Assessment & Plan Note (Addendum)
Check lipids today; continue statin; showed cross-sectional model of artery; encouraged him to cut back on saturated fats such as bacon and sausage but he is not interested; encouraged him to continue weight loss

## 2015-12-23 NOTE — Assessment & Plan Note (Signed)
Check uric acid level; avoid/limit [place], gravy, livers

## 2015-12-23 NOTE — Assessment & Plan Note (Addendum)
Check SGPT on statin, Cr on allopurinol and ACE-I, K+ on ACE-I

## 2015-12-23 NOTE — Patient Instructions (Addendum)
Keep up the good work with weight loss Check out the information at [facility] entitled "Nutrition for Weight Loss: What You Need to Know about Fad Diets" Try to lose between 1-2 pounds per week by taking in fewer calories and burning off more calories You can succeed by limiting portions, limiting foods dense in calories and fat, becoming more active, and drinking 8 glasses of water a day (64 ounces) Don't skip meals, especially breakfast, as skipping meals may alter your metabolism Do not use over-the-counter weight loss pills or gimmicks that claim rapid weight loss A healthy BMI (or body mass index) is between 18.5 and 24.9 You can calculate your ideal BMI at the [place] website [employer]  Decrease the glipizide to just ONCE a day (in the morning)  I can allow one time of glucose checking a day, just one strip per day Bring [place] your glucose numbers to have on file, bring to appointments

## 2015-12-23 NOTE — Assessment & Plan Note (Signed)
Managed by Dr. Jacqlyn Larsen

## 2015-12-23 NOTE — Assessment & Plan Note (Signed)
Encouraged patient for working on weight loss; keep it up; see AVS

## 2015-12-23 NOTE — Assessment & Plan Note (Signed)
Check creatinine

## 2015-12-23 NOTE — Assessment & Plan Note (Signed)
Neuropathy seems to have resolved for the present time based on symptoms and monofilament exam; keep A1c controlled to help prevent progression of symptoms; stop evening dose of sulfonylurea; I tried to explain ACE Best Practices guidelines to him, that he does NOT need to check his FSBS 5x a day, and in fact does not need to check FSBS at all if he desires; I cannot allow more than 1x a day since he is controlled without insulin on oral agents only; explained guidelines; he was disappointed; I asked him to check no more than 1x a day and bring readings to appts; foot exam by MD today; check urine microalbumin:creatinine today

## 2015-12-23 NOTE — Progress Notes (Signed)
BP (!) 116/58   Pulse 99   Temp 97.7 F (36.5 C) (Oral)   Resp 16   Wt 285 lb (129.3 kg)   SpO2 93%   BMI 39.75 kg/m    Subjective:    Patient ID: Donald Pou., male    DOB: 07-22-1945, 70 y.o.   MRN: [id number]  HPI: Donald Allende. is a 70 y.o. male  Chief Complaint  Patient presents with  . Follow-up   Patient is new to me; his usual provider is out of the office for an extended time; he saw Dr. Vicente Masson in June  He has type 2 diabetes mellitus; he used to have to have neuropathy; very sedentary; walks from the parking lot in to the office and that's the most exercise he gets; he doesn't get any exercise He eats white wheat; brown wheat in the morning; he would drink sweets but knows it is bad so he doesn't; he says he checks his FSBS five times a day; I asked why so often and he says that's how he's always done it; if his sugar goes over 200, he doesn't eat until it comes back down; last CMP reviewed; glucose 145, A1c 6.8 in June 2017  Mildly low vitamin D level in June, 28.6  Gout; on allopurinol; no flares in 6 months; right foot; he does not eat [place] or gravy  HIgh cholesterol; on statin; does eat bacon, sausage; he is going to eat bacon or sausage every day of his life; he is not interested in cutting back on those things  Obesity; he has lost 8 pounds since last visit; he has lost 150+ pounds altogether; he weighed 444 pounds at his highest weight  30 years of kidney stones with Dr. Jacqlyn Larsen  Depression screen [facility] 2/9 12/23/2015 12/18/2014  Decreased Interest 0 0  Down, Depressed, Hopeless 0 0  PHQ - 2 Score 0 0   Relevant past medical, surgical, family and social history reviewed Past Medical History:  Diagnosis Date  . Allergy   . Diabetes mellitus without complication ([place])   . Hyperlipidemia   . Hypertension   . Kidney stones   . Obesity 08/20/2015  . Severe obesity (BMI >= 40) ([place]) 08/20/2015  MD note: obesity + diabetes  Past Surgical History:    Procedure Laterality Date  . kidney stones     Family History  Problem Relation Age of Onset  . Heart disease Father    Social History  Substance Use Topics  . Smoking status: Former Research scientist (life sciences)  . Smokeless tobacco: Never Used  . Alcohol use No   Interim medical history since last visit reviewed. Allergies and medications reviewed  Review of Systems Per HPI unless specifically indicated above     Objective:    BP (!) 116/58   Pulse 99   Temp 97.7 F (36.5 C) (Oral)   Resp 16   Wt 285 lb (129.3 kg)   SpO2 93%   BMI 39.75 kg/m   Wt Readings from Last 3 Encounters:  12/23/15 285 lb (129.3 kg)  08/20/15 293 lb 2 oz (133 kg)  12/18/14 290 lb (131.5 kg)    Physical Exam  Constitutional: He appears well-developed and well-nourished. No distress.  HENT:  Head: Normocephalic and atraumatic.  Eyes: EOM are normal. No scleral icterus.  Neck: No thyromegaly present.  Cardiovascular: Normal rate and regular rhythm.   Pulmonary/Chest: Effort normal and breath sounds normal.  Abdominal: Soft. Bowel sounds are normal. He  exhibits no distension.  Musculoskeletal: He exhibits no edema.  Neurological: Coordination normal.  Skin: Skin is warm and dry. No pallor.  Psychiatric: He has a normal mood and affect. His behavior is normal. Judgment and thought content normal.   Diabetic Foot Form - Detailed   Diabetic Foot Exam - detailed Diabetic Foot exam was performed with the following findings:  Yes 12/23/2015  9:28 AM  Visual Foot Exam completed.:  Yes  Are the toenails long?:  No Are the toenails thick?:  No Are the toenails ingrown?:  No Normal Range of Motion:  Yes Pulse Foot Exam completed.:  Yes  Right Dorsalis Pedis:  Present Left Dorsalis Pedis:  Present  Sensory Foot Exam Completed.:  Yes Swelling:  No Semmes-Weinstein Monofilament Test R Site 1-Great Toe:  Pos L Site 1-Great Toe:  Pos  R Site 4:  Pos L Site 4:  Pos  R Site 5:  Pos L Site 5:  Pos       Results for  orders placed or performed in visit on 12/23/15  POCT HgB A1C  Result Value Ref Range   Hemoglobin A1C 6.3       Assessment & Plan:   Problem List Items Addressed This Visit      Endocrine   Diabetes mellitus due to underlying condition, controlled, with diabetic nephropathy (HCC) (Chronic)    Neuropathy seems to have resolved for the present time based on symptoms and monofilament exam; keep A1c controlled to help prevent progression of symptoms; stop evening dose of sulfonylurea; I tried to explain ACE Best Practices guidelines to him, that he does NOT need to check his FSBS 5x a day, and in fact does not need to check FSBS at all if he desires; I cannot allow more than 1x a day since he is controlled without insulin on oral agents only; explained guidelines; he was disappointed; I asked him to check no more than 1x a day and bring readings to appts; foot exam by MD today; check urine microalbumin:creatinine today      Relevant Medications   glipiZIDE (GLUCOTROL XL) 10 MG 24 hr tablet     Genitourinary   Chronic kidney disease (CKD), stage II (mild) (Chronic)    Check creatinine      Bladder calculi    Managed by Dr. Jacqlyn Larsen        Other   Obesity (Chronic)    Encouraged patient for working on weight loss; keep it up; see AVS      Relevant Medications   glipiZIDE (GLUCOTROL XL) 10 MG 24 hr tablet   Hyperlipidemia LDL goal <100 (Chronic)    Check lipids today; continue statin; showed cross-sectional model of artery; encouraged him to cut back on saturated fats such as bacon and sausage but he is not interested; encouraged him to continue weight loss      Relevant Orders   Lipid Panel w/Direct LDL:HDL Ratio   Gout (Chronic)    Check uric acid level; avoid/limit [place], gravy, livers      Relevant Orders   Uric acid   Encounter for medication monitoring    Check SGPT on statin, Cr on allopurinol and ACE-I, K+ on ACE-I      Relevant Orders   COMPLETE METABOLIC PANEL  WITH GFR    Other Visit Diagnoses    Diabetes mellitus due to underlying condition with diabetic nephropathy, without long-term current use of insulin ([place])    -  Primary   Relevant Medications  glipiZIDE (GLUCOTROL XL) 10 MG 24 hr tablet   Other Relevant Orders   POCT HgB A1C (Completed)   Microalbumin / creatinine urine ratio   Needs flu shot       Relevant Orders   Flu vaccine HIGH DOSE PF (Fluzone High dose) (Completed)      Follow up plan: Return in about 4 months (around 04/24/2016) for visit and fasting labs.  An after-visit summary was printed and given to the patient at [place].  Please see the patient instructions which may contain other information and recommendations beyond what is mentioned above in the assessment and plan.  Meds ordered this encounter  Medications  . glipiZIDE (GLUCOTROL XL) 10 MG 24 hr tablet    Sig: Take 1 tablet (10 mg total) by mouth daily with breakfast.    Dispense:  90 tablet    Refill:  1    Decreasing and may stop next time    Orders Placed This Encounter  Procedures  . Flu vaccine HIGH DOSE PF (Fluzone High dose)  . Lipid Panel w/Direct LDL:HDL Ratio  . Uric acid  . COMPLETE METABOLIC PANEL WITH GFR  . Microalbumin / creatinine urine ratio  . POCT HgB A1C

## 2015-12-24 LAB — COMPLETE METABOLIC PANEL WITH GFR
ALT: 36 U/L (ref 9–46)
AST: 29 U/L (ref 10–35)
Albumin: 4.2 g/dL (ref 3.6–5.1)
Alkaline Phosphatase: 53 U/L (ref 40–115)
BUN: 23 mg/dL (ref 7–25)
CHLORIDE: 103 mmol/L (ref 98–110)
CO2: 22 mmol/L (ref 20–31)
Calcium: 9.7 mg/dL (ref 8.6–10.3)
Creat: 1.31 mg/dL — ABNORMAL HIGH (ref 0.70–1.25)
GFR, EST NON AFRICAN AMERICAN: 55 mL/min — AB (ref >=60)
GFR, Est African American: 64 mL/min (ref >=60)
GLUCOSE: 121 mg/dL — AB (ref 65–99)
POTASSIUM: 4.4 mmol/L (ref 3.5–5.3)
SODIUM: 139 mmol/L (ref 135–146)
Total Bilirubin: 0.4 mg/dL (ref 0.2–1.2)
Total Protein: 6.9 g/dL (ref 6.1–8.1)

## 2015-12-24 LAB — LIPID PANEL W/REFLEX DIRECT LDL
CHOL/HDL RATIO: 5.9 ratio — AB (ref <=5.0)
CHOLESTEROL: 189 mg/dL (ref 125–200)
HDL: 32 mg/dL — AB (ref >=40)
LDL Cholesterol: 104 mg/dL (ref <130)
Triglycerides: 266 mg/dL — ABNORMAL HIGH (ref <150)

## 2015-12-24 LAB — MICROALBUMIN / CREATININE URINE RATIO
Creatinine, Urine: 264 mg/dL (ref 20–370)
MICROALB/CREAT RATIO: 8 ug/mg{creat} (ref <30)
Microalb, Ur: 2 mg/dL

## 2015-12-24 LAB — URIC ACID: Uric Acid, Serum: 5.3 mg/dL (ref 4.0–8.0)

## 2015-12-25 DIAGNOSIS — Z872 Personal history of diseases of the skin and subcutaneous tissue: Secondary | ICD-10-CM | POA: Diagnosis not present

## 2015-12-25 DIAGNOSIS — Z09 Encounter for follow-up examination after completed treatment for conditions other than malignant neoplasm: Secondary | ICD-10-CM | POA: Diagnosis not present

## 2015-12-25 DIAGNOSIS — Z1283 Encounter for screening for malignant neoplasm of skin: Secondary | ICD-10-CM | POA: Diagnosis not present

## 2015-12-25 DIAGNOSIS — L570 Actinic keratosis: Secondary | ICD-10-CM | POA: Diagnosis not present

## 2016-02-03 DIAGNOSIS — E119 Type 2 diabetes mellitus without complications: Secondary | ICD-10-CM | POA: Diagnosis not present

## 2016-02-03 LAB — HM DIABETES EYE EXAM

## 2016-03-10 ENCOUNTER — Other Ambulatory Visit: Payer: Self-pay | Admitting: Family Medicine

## 2016-03-17 ENCOUNTER — Other Ambulatory Visit: Payer: Self-pay

## 2016-03-17 MED ORDER — GLUCOSE BLOOD VI STRP: ORAL_STRIP | 1 refills | Status: DC

## 2016-03-17 NOTE — Telephone Encounter (Signed)
Glitch in computer system; I was not getting refill requests; addressing today ------------------------------- rx sent

## 2016-03-18 ENCOUNTER — Other Ambulatory Visit: Payer: Self-pay

## 2016-03-18 ENCOUNTER — Telehealth: Payer: Self-pay

## 2016-03-19 DIAGNOSIS — E119 Type 2 diabetes mellitus without complications: Secondary | ICD-10-CM | POA: Diagnosis not present

## 2016-03-19 MED ORDER — GLUCOSE BLOOD VI STRP: 100.0000 | ORAL_STRIP | 1 refills | Status: DC | PRN

## 2016-03-19 NOTE — Telephone Encounter (Signed)
New rx sent for strips

## 2016-04-23 ENCOUNTER — Ambulatory Visit: Payer: Medicare Other | Admitting: Family Medicine

## 2016-04-28 ENCOUNTER — Ambulatory Visit (INDEPENDENT_AMBULATORY_CARE_PROVIDER_SITE_OTHER): Payer: Medicare Other | Admitting: Family Medicine

## 2016-04-28 ENCOUNTER — Encounter: Payer: Self-pay | Admitting: Family Medicine

## 2016-04-28 VITALS — BP 108/58 | HR 78 | Temp 99.5°F | Resp 18 | Ht 71.0 in | Wt 272.2 lb

## 2016-04-28 DIAGNOSIS — IMO0001 Reserved for inherently not codable concepts without codable children: Secondary | ICD-10-CM

## 2016-04-28 DIAGNOSIS — R634 Abnormal weight loss: Secondary | ICD-10-CM

## 2016-04-28 DIAGNOSIS — Z1159 Encounter for screening for other viral diseases: Secondary | ICD-10-CM

## 2016-04-28 DIAGNOSIS — E785 Hyperlipidemia, unspecified: Secondary | ICD-10-CM | POA: Diagnosis not present

## 2016-04-28 DIAGNOSIS — Z6839 Body mass index (BMI) 39.0-39.9, adult: Secondary | ICD-10-CM | POA: Diagnosis not present

## 2016-04-28 DIAGNOSIS — M1000 Idiopathic gout, unspecified site: Secondary | ICD-10-CM

## 2016-04-28 DIAGNOSIS — E6609 Other obesity due to excess calories: Secondary | ICD-10-CM | POA: Diagnosis not present

## 2016-04-28 DIAGNOSIS — Z5181 Encounter for therapeutic drug level monitoring: Secondary | ICD-10-CM

## 2016-04-28 DIAGNOSIS — N182 Chronic kidney disease, stage 2 (mild): Secondary | ICD-10-CM | POA: Diagnosis not present

## 2016-04-28 DIAGNOSIS — E1121 Type 2 diabetes mellitus with diabetic nephropathy: Secondary | ICD-10-CM

## 2016-04-28 DIAGNOSIS — E0821 Diabetes mellitus due to underlying condition with diabetic nephropathy: Secondary | ICD-10-CM

## 2016-04-28 LAB — LIPID PANEL
CHOL/HDL RATIO: 2.8 ratio (ref <5.0)
CHOLESTEROL: 94 mg/dL (ref <200)
HDL: 33 mg/dL — AB (ref >40)
LDL Cholesterol: 36 mg/dL (ref <100)
Triglycerides: 124 mg/dL (ref <150)
VLDL: 25 mg/dL (ref <30)

## 2016-04-28 LAB — CBC WITH DIFFERENTIAL/PLATELET
BASOS ABS: 61 {cells}/uL (ref 0–200)
Basophils Relative: 1 %
EOS ABS: 122 {cells}/uL (ref 15–500)
Eosinophils Relative: 2 %
HCT: 40 % (ref 38.5–50.0)
HEMOGLOBIN: 12.9 g/dL — AB (ref 13.2–17.1)
LYMPHS ABS: 2135 {cells}/uL (ref 850–3900)
Lymphocytes Relative: 35 %
MCH: 29.7 pg (ref 27.0–33.0)
MCHC: 32.3 g/dL (ref 32.0–36.0)
MCV: 92.2 fL (ref 80.0–100.0)
MONO ABS: 427 {cells}/uL (ref 200–950)
MPV: 9.1 fL (ref 7.5–12.5)
Monocytes Relative: 7 %
NEUTROS PCT: 55 %
Neutro Abs: 3355 cells/uL (ref 1500–7800)
Platelets: 234 10*3/uL (ref 140–400)
RBC: 4.34 MIL/uL (ref 4.20–5.80)
RDW: 14 % (ref 11.0–15.0)
WBC: 6.1 10*3/uL (ref 3.8–10.8)

## 2016-04-28 LAB — COMPREHENSIVE METABOLIC PANEL
ALBUMIN: 4.1 g/dL (ref 3.6–5.1)
ALT: 23 U/L (ref 9–46)
AST: 18 U/L (ref 10–35)
Alkaline Phosphatase: 51 U/L (ref 40–115)
BILIRUBIN TOTAL: 0.4 mg/dL (ref 0.2–1.2)
BUN: 24 mg/dL (ref 7–25)
CALCIUM: 9.8 mg/dL (ref 8.6–10.3)
CO2: 27 mmol/L (ref 20–31)
Chloride: 104 mmol/L (ref 98–110)
Creat: 1.03 mg/dL (ref 0.70–1.25)
Glucose, Bld: 144 mg/dL — ABNORMAL HIGH (ref 65–99)
Potassium: 4.6 mmol/L (ref 3.5–5.3)
Sodium: 140 mmol/L (ref 135–146)
Total Protein: 6.7 g/dL (ref 6.1–8.1)

## 2016-04-28 LAB — POCT GLYCOSYLATED HEMOGLOBIN (HGB A1C): HEMOGLOBIN A1C: 6.1

## 2016-04-28 MED ORDER — QUINAPRIL HCL 10 MG PO TABS: 10.0000 mg | ORAL_TABLET | Freq: Every day | ORAL | 5 refills | Status: DC

## 2016-04-28 MED ORDER — QUINAPRIL HCL 10 MG PO TABS: 10.0000 mg | ORAL_TABLET | Freq: Every day | ORAL | 1 refills | Status: DC

## 2016-04-28 NOTE — Assessment & Plan Note (Addendum)
Praise given for trying to lose weight; he believes his weight loss is all intentional; I will check labs to r/o hyperthyroidism, anemia, etc.; he declines colonoscopy when suggested

## 2016-04-28 NOTE — Assessment & Plan Note (Signed)
Monitor sgpt and Cr and K+

## 2016-04-28 NOTE — Assessment & Plan Note (Signed)
Avoid purine-rich foods

## 2016-04-28 NOTE — Assessment & Plan Note (Signed)
Avoid NSAIDs, hydrate; decrease ACE-I

## 2016-04-28 NOTE — Assessment & Plan Note (Addendum)
Foot exam by MD today; eye exam UTD; limit sweets; suggested new meter; A1c 6.1; decrease the ACE-I given today's low BP

## 2016-04-28 NOTE — Assessment & Plan Note (Signed)
Limit saturated fats; discussed foods to limit

## 2016-04-28 NOTE — Patient Instructions (Addendum)
Let's get labs today Reduce your quinapril to 10 mg daily Return in 2 weeks for just a nurse visit for blood pressure Let's get labs today

## 2016-04-28 NOTE — Progress Notes (Addendum)
BP (!) 108/58   Pulse 78   Temp 99.5 F (37.5 C) (Oral)   Resp 18   Ht _0  (1.803 m)   Wt 272 lb 3.2 oz (123.5 kg)   SpO2 96%   BMI 37.96 kg/m    Subjective:    Patient ID: Donald Pou., male    DOB: Oct 29, 1945, 71 y.o.   MRN: [id number]  HPI: Donald Reffner. is a 71 y.o. male  Chief Complaint  Patient presents with  . Follow-up    patient is here for his 4 month f/u  . Diabetes    patient checks his blood sugar daily. he has his log with him. patient stated that some readings are higher since stopping a pill  . Hyperlipidemia    patient has not had any negative sx  . Obesity    patient has lost 13lbs since his last visit  . Nephrolithiasis    patient states his still has the kidney stones that Dr. [facility]qlyn Larsen with [facility] is monitoring  . Gout    patient stated that he has not had any flare ups.  . Labs Only    patient stated that he is fasting   Patient is here for f/u  Type 2 diabetes; checking sugars every day; highest 170 lately; no problems with feet; no dry mouth; recent eye exam, Nov 2017; he has "[place]" on his eyes;  We reviewed his glucose readings; he has been to the diabetic education classes; he knows he is supposed to be taking a baby aspirin, but he gets bruises on his hands when he bumps things; denies nosebleeds, gum bleeding  Gout; no recent flare-up; last uric acid reviewed, controlled, under 6 (December 23, 2015, uric acid 5.3)  High cholesterol; eating fatty meats, sausage, bacon Lab Results  Component Value Date   CHOL 94 04/28/2016   HDL 33 (L) 04/28/2016   LDLCALC 36 04/28/2016   TRIG 124 04/28/2016   CHOLHDL 2.8 04/28/2016   Kidney stones, managed by Dr. Jacqlyn Larsen, urologist, passed on on 02/22/16; had some burning on Thursday, urinary urgency; he did not call Dr. Jacqlyn Larsen; he feels fine  Obesity; he has been losing weight intentionally; he loves breakfast, so he has kept that up, but he has decreased his lunch over the last  several months, now just an apple or cutie orange or peanut butter crackers; does not really eat much supper; he used to weigh over 400 pounds; he maintains that this weight loss is intentional  Patient declines colonoscopy; okay for hep C; patient declines tetanus, but he says it was within the last ten years; he thinks it was here (I asked staff to update HM list)  Depression screen [facility] 2/9 04/28/2016 12/23/2015 12/18/2014  Decreased Interest 0 0 0  Down, Depressed, Hopeless 0 0 0  PHQ - 2 Score 0 0 0   Relevant past medical, surgical, family and social history reviewed Past Medical History:  Diagnosis Date  . Allergy   . Diabetes mellitus without complication ([place])   . Hyperlipidemia   . Hypertension   . Kidney stones   . Obesity 08/20/2015   Past Surgical History:  Procedure Laterality Date  . kidney stones     Family History  Problem Relation Age of Onset  . Heart disease Father   . Diabetes Father    Social History  Substance Use Topics  . Smoking status: Former Research scientist (life sciences)  . Smokeless tobacco: Never Used  .  Alcohol use No   Interim medical history since last visit reviewed. Allergies and medications reviewed  Review of Systems Per HPI unless specifically indicated above     Objective:    BP (!) 108/58   Pulse 78   Temp 99.5 F (37.5 C) (Oral)   Resp 18   Ht _0  (1.803 m)   Wt 272 lb 3.2 oz (123.5 kg)   SpO2 96%   BMI 37.96 kg/m   Wt Readings from Last 3 Encounters:  04/28/16 272 lb 3.2 oz (123.5 kg)  12/23/15 285 lb (129.3 kg)  08/20/15 293 lb 2 oz (133 kg)    Physical Exam  Constitutional: He appears well-developed and well-nourished. No distress.  obese  HENT:  Head: Normocephalic and atraumatic.  Eyes: EOM are normal. No scleral icterus.  Neck: No thyromegaly present.  Cardiovascular: Normal rate and regular rhythm.   Pulmonary/Chest: Effort normal and breath sounds normal.  Abdominal: Soft. Bowel sounds are normal. He exhibits no distension.    Musculoskeletal: He exhibits no edema.  Neurological: Coordination normal.  Skin: Skin is warm and dry. No pallor.  Psychiatric: He has a normal mood and affect. His behavior is normal. Judgment and thought content normal.   Diabetic Foot Form - Detailed   Diabetic Foot Exam - detailed Diabetic Foot exam was performed with the following findings:  Yes 04/28/2016  8:17 AM  Visual Foot Exam completed.:  Yes  Are the toenails ingrown?:  No Normal Range of Motion:  Yes Pulse Foot Exam completed.:  Yes  Right Dorsalis Pedis:  Present Left Dorsalis Pedis:  Present  Sensory Foot Exam Completed.:  Yes Swelling:  No Semmes-Weinstein Monofilament Test R Site 1-Great Toe:  Pos L Site 1-Great Toe:  Pos  R Site 4:  Pos L Site 4:  Pos  R Site 5:  Pos L Site 5:  Pos       Results for orders placed or performed in visit on 04/28/16  Lipid panel  Result Value Ref Range   Cholesterol 94 <200 mg/dL   Triglycerides 124 <150 mg/dL   HDL 33 (L) >40 mg/dL   Total CHOL/HDL Ratio 2.8 <5.0 Ratio   VLDL 25 <30 mg/dL   LDL Cholesterol 36 <100 mg/dL  TSH  Result Value Ref Range   TSH 2.54 0.40 - 4.50 mIU/L  CBC with Differential/Platelet  Result Value Ref Range   WBC 6.1 3.8 - 10.8 K/uL   RBC 4.34 4.20 - 5.80 MIL/uL   Hemoglobin 12.9 (L) 13.2 - 17.1 g/dL   HCT 40.0 38.5 - 50.0 %   MCV 92.2 80.0 - 100.0 fL   MCH 29.7 27.0 - 33.0 pg   MCHC 32.3 32.0 - 36.0 g/dL   RDW 14.0 11.0 - 15.0 %   Platelets 234 140 - 400 K/uL   MPV 9.1 7.5 - 12.5 fL   Neutro Abs 3,355 1,500 - 7,800 cells/uL   Lymphs Abs 2,135 850 - 3,900 cells/uL   Monocytes Absolute 427 200 - 950 cells/uL   Eosinophils Absolute 122 15 - 500 cells/uL   Basophils Absolute 61 0 - 200 cells/uL   Neutrophils Relative % 55 %   Lymphocytes Relative 35 %   Monocytes Relative 7 %   Eosinophils Relative 2 %   Basophils Relative 1 %   Smear Review Criteria for review not met   Comprehensive metabolic panel  Result Value Ref Range   Sodium  140 135 - 146 mmol/L   Potassium 4.6  3.5 - 5.3 mmol/L   Chloride 104 98 - 110 mmol/L   CO2 27 20 - 31 mmol/L   Glucose, Bld 144 (H) 65 - 99 mg/dL   BUN 24 7 - 25 mg/dL   Creat 1.03 0.70 - 1.25 mg/dL   Total Bilirubin 0.4 0.2 - 1.2 mg/dL   Alkaline Phosphatase 51 40 - 115 U/L   AST 18 10 - 35 U/L   ALT 23 9 - 46 U/L   Total Protein 6.7 6.1 - 8.1 g/dL   Albumin 4.1 3.6 - 5.1 g/dL   Calcium 9.8 8.6 - 10.3 mg/dL  Hepatitis C Antibody  Result Value Ref Range   HCV Ab NEGATIVE NEGATIVE  POCT glycosylated hemoglobin (Hb A1C)  Result Value Ref Range   Hemoglobin A1C 6.1       Assessment & Plan:   Problem List Items Addressed This Visit      Endocrine   Controlled diabetes mellitus with diabetic nephropathy (HCC) - Primary    Foot exam by MD today; eye exam UTD; limit sweets; suggested new meter; A1c 6.1; decrease the ACE-I given today's low BP      Relevant Medications   quinapril (ACCUPRIL) 10 MG tablet   Other Relevant Orders   POCT glycosylated hemoglobin (Hb A1C) (Completed)   Lipid panel (Completed)     Genitourinary   Chronic kidney disease (CKD), stage II (mild) (Chronic)    Avoid NSAIDs, hydrate; decrease ACE-I        Other   Obesity (Chronic)    Praise given for trying to lose weight; he believes his weight loss is all intentional; I will check labs to r/o hyperthyroidism, anemia, etc.; he declines colonoscopy when suggested      Hyperlipidemia LDL goal <100 (Chronic)    Limit saturated fats; discussed foods to limit      Relevant Medications   quinapril (ACCUPRIL) 10 MG tablet   Other Relevant Orders   Lipid panel (Completed)   Gout (Chronic)    Avoid purine-rich foods      Encounter for medication monitoring    Monitor sgpt and Cr and K+      Relevant Orders   Comprehensive metabolic panel (Completed)    Other Visit Diagnoses    Abnormal weight loss       patient believes this is all intentional; not from out of control diabetes; will check TSH;  he declines colonoscopy; check CBC; f/u in 3 months instead of 6 mon   Relevant Orders   TSH (Completed)   CBC with Differential/Platelet (Completed)   Encounter for hepatitis C screening test for low risk patient       Relevant Orders   Hepatitis C Antibody (Completed)      Follow up plan: Return in about 2 weeks (around 05/12/2016) for blood pressure check with Donald Mcdowell; 3 months with Donald Mcdowell.  An after-visit summary was printed and given to the patient at [place].  Please see the patient instructions which may contain other information and recommendations beyond what is mentioned above in the assessment and plan.  Meds ordered this encounter  Medications  . Omega-3 Fatty Acids (FISH OIL) 1200 MG CAPS    Sig: Take by mouth 2 (two) times daily.  Marland Kitchen DISCONTD: quinapril (ACCUPRIL) 10 MG tablet    Sig: Take 1 tablet (10 mg total) by mouth daily.    Dispense:  30 tablet    Refill:  5    New lower dose  . quinapril (ACCUPRIL)  10 MG tablet    Sig: Take 1 tablet (10 mg total) by mouth daily.    Dispense:  90 tablet    Refill:  1    New lower dose    Orders Placed This Encounter  Procedures  . Lipid panel  . TSH  . CBC with Differential/Platelet  . Comprehensive metabolic panel  . Hepatitis C Antibody  . POCT glycosylated hemoglobin (Hb A1C)

## 2016-04-29 LAB — TSH: TSH: 2.54 mIU/L (ref 0.40–4.50)

## 2016-04-29 LAB — HEPATITIS C ANTIBODY: HCV Ab: NEGATIVE

## 2016-05-12 ENCOUNTER — Ambulatory Visit: Payer: Medicare Other

## 2016-05-12 VITALS — BP 114/62 | HR 87

## 2016-05-12 DIAGNOSIS — I10 Essential (primary) hypertension: Secondary | ICD-10-CM

## 2016-08-28 ENCOUNTER — Ambulatory Visit (INDEPENDENT_AMBULATORY_CARE_PROVIDER_SITE_OTHER): Payer: Medicare Other | Admitting: Family Medicine

## 2016-08-28 ENCOUNTER — Encounter: Payer: Self-pay | Admitting: Family Medicine

## 2016-08-28 VITALS — BP 116/64 | HR 99 | Temp 98.8°F | Resp 14 | Wt 275.7 lb

## 2016-08-28 DIAGNOSIS — I951 Orthostatic hypotension: Secondary | ICD-10-CM | POA: Diagnosis not present

## 2016-08-28 DIAGNOSIS — N182 Chronic kidney disease, stage 2 (mild): Secondary | ICD-10-CM | POA: Diagnosis not present

## 2016-08-28 DIAGNOSIS — I10 Essential (primary) hypertension: Secondary | ICD-10-CM | POA: Diagnosis not present

## 2016-08-28 DIAGNOSIS — E785 Hyperlipidemia, unspecified: Secondary | ICD-10-CM

## 2016-08-28 DIAGNOSIS — E1121 Type 2 diabetes mellitus with diabetic nephropathy: Secondary | ICD-10-CM

## 2016-08-28 DIAGNOSIS — D649 Anemia, unspecified: Secondary | ICD-10-CM

## 2016-08-28 DIAGNOSIS — Z5181 Encounter for therapeutic drug level monitoring: Secondary | ICD-10-CM

## 2016-08-28 LAB — CBC WITH DIFFERENTIAL/PLATELET
BASOS PCT: 1 %
Basophils Absolute: 64 cells/uL (ref 0–200)
EOS ABS: 128 {cells}/uL (ref 15–500)
EOS PCT: 2 %
HCT: 31.8 % — ABNORMAL LOW (ref 38.5–50.0)
HEMOGLOBIN: 9.4 g/dL — AB (ref 13.2–17.1)
LYMPHS ABS: 2368 {cells}/uL (ref 850–3900)
Lymphocytes Relative: 37 %
MCH: 24.5 pg — ABNORMAL LOW (ref 27.0–33.0)
MCHC: 29.6 g/dL — ABNORMAL LOW (ref 32.0–36.0)
MCV: 82.8 fL (ref 80.0–100.0)
MONOS PCT: 6 %
MPV: 8.2 fL (ref 7.5–12.5)
Monocytes Absolute: 384 cells/uL (ref 200–950)
NEUTROS ABS: 3456 {cells}/uL (ref 1500–7800)
Neutrophils Relative %: 54 %
Platelets: 336 10*3/uL (ref 140–400)
RBC: 3.84 MIL/uL — ABNORMAL LOW (ref 4.20–5.80)
RDW: 15 % (ref 11.0–15.0)
WBC: 6.4 10*3/uL (ref 3.8–10.8)

## 2016-08-28 LAB — LIPID PANEL
CHOL/HDL RATIO: 2.5 ratio (ref <5.0)
CHOLESTEROL: 94 mg/dL (ref <200)
HDL: 38 mg/dL — ABNORMAL LOW (ref >40)
LDL CALC: 35 mg/dL (ref <100)
Triglycerides: 104 mg/dL (ref <150)
VLDL: 21 mg/dL (ref <30)

## 2016-08-28 LAB — COMPLETE METABOLIC PANEL WITH GFR
ALK PHOS: 58 U/L (ref 40–115)
ALT: 21 U/L (ref 9–46)
AST: 16 U/L (ref 10–35)
Albumin: 4.4 g/dL (ref 3.6–5.1)
BILIRUBIN TOTAL: 0.3 mg/dL (ref 0.2–1.2)
BUN: 22 mg/dL (ref 7–25)
CO2: 25 mmol/L (ref 20–31)
Calcium: 9.9 mg/dL (ref 8.6–10.3)
Chloride: 102 mmol/L (ref 98–110)
Creat: 1.31 mg/dL — ABNORMAL HIGH (ref 0.70–1.18)
GFR, EST AFRICAN AMERICAN: 63 mL/min (ref >=60)
GFR, EST NON AFRICAN AMERICAN: 55 mL/min — AB (ref >=60)
Glucose, Bld: 149 mg/dL — ABNORMAL HIGH (ref 65–99)
POTASSIUM: 4.5 mmol/L (ref 3.5–5.3)
SODIUM: 138 mmol/L (ref 135–146)
TOTAL PROTEIN: 7 g/dL (ref 6.1–8.1)

## 2016-08-28 MED ORDER — QUINAPRIL HCL 5 MG PO TABS: 5.0000 mg | ORAL_TABLET | Freq: Every day | ORAL | 5 refills | Status: DC

## 2016-08-28 NOTE — Assessment & Plan Note (Signed)
Foot exam by MD; check A1c and glucose; work on weight loss; on ACE-I

## 2016-08-28 NOTE — Assessment & Plan Note (Signed)
He is not interested in decreasing the sausage; check lipids and SGPT

## 2016-08-28 NOTE — Assessment & Plan Note (Signed)
BMI >35 plus diabetes = morbid obesity; encouraged weight loss

## 2016-08-28 NOTE — Progress Notes (Signed)
BP 116/64   Pulse 99   Temp 98.8 F (37.1 C) (Oral)   Resp 14   Wt 275 lb 11.2 oz (125.1 kg)   SpO2 94%   BMI 38.45 kg/m    Subjective:    Patient ID: Donald Mcdowell., male    DOB: 12/22/1945, 71 y.o.   MRN: [id number]  HPI: Donald Mcdowell is a 71 y.o. male  Chief Complaint  Patient presents with  . Follow-up  . Foot Pain    right top of foot  . Dizziness    some episodes   HPI Type 2 diabetes; last A1c in Feb was 6.1; urine microalbumin:Cr was normal; he used to check his sugars religiously; he does not get symptoms from high to low; eats hash browns and sugars go up over 200  Some pain across the top of the RIGHT foot for about months  High cholesterol; last lipids showed HDL of 33, LDL of 36; sausage 7 days a week; sausage bisuit from [employer]  Mild dizziness for two months; standing up and getting a little woozy and light-headed; has not passed out; sits down for a brief moment and it goes away; 15 seconds  Obesity; ate four or five oreos last night; eats hash browns, hamburgers, sausage  Mild anemia noted last labs; patient refused cologuard, colonoscopy, stool cards in February  Depression screen [facility] 2/9 08/28/2016 04/28/2016 12/23/2015 12/18/2014  Decreased Interest 0 0 0 0  Down, Depressed, Hopeless 0 0 0 0  PHQ - 2 Score 0 0 0 0    Relevant past medical, surgical, family and social history reviewed Past Medical History:  Diagnosis Date  . Allergy   . Diabetes mellitus without complication ([place])   . Hyperlipidemia   . Hypertension   . Kidney stones   . Obesity 08/20/2015   Past Surgical History:  Procedure Laterality Date  . kidney stones     Family History  Problem Relation Age of Onset  . Heart disease Father   . Diabetes Father    Social History   Social History  . Marital status: Married    Spouse name: N/A  . Number of children: N/A  . Years of education: N/A   Occupational History  . Not on file.   Social History Main  Topics  . Smoking status: Former Research scientist (life sciences)  . Smokeless tobacco: Never Used  . Alcohol use No  . Drug use: No  . Sexual activity: Not Currently   Other Topics Concern  . Not on file   Social History Narrative  . No narrative on file    Interim medical history since last visit reviewed. Allergies and medications reviewed  Review of Systems Per HPI unless specifically indicated above     Objective:    BP 116/64   Pulse 99   Temp 98.8 F (37.1 C) (Oral)   Resp 14   Wt 275 lb 11.2 oz (125.1 kg)   SpO2 94%   BMI 38.45 kg/m   Wt Readings from Last 3 Encounters:  08/28/16 275 lb 11.2 oz (125.1 kg)  04/28/16 272 lb 3.2 oz (123.5 kg)  12/23/15 285 lb (129.3 kg)    Physical Exam  Constitutional: He appears well-developed and well-nourished. No distress.  Obese, weight gain 3-1/2 pounds over last 4 months  HENT:  Head: Normocephalic and atraumatic.  Eyes: EOM are normal. No scleral icterus.  Neck: No thyromegaly present.  Cardiovascular: Normal rate and regular rhythm.   Pulmonary/Chest:  Effort normal and breath sounds normal.  Abdominal: Soft. Bowel sounds are normal. He exhibits no distension.  Musculoskeletal: He exhibits no edema.  Neurological: Coordination normal.  Skin: Skin is warm and dry. No pallor.  Psychiatric: He has a normal mood and affect. His behavior is normal. Judgment and thought content normal.   Diabetic Foot Form - Detailed   Diabetic Foot Exam - detailed Diabetic Foot exam was performed with the following findings:  Yes 08/28/2016  3:27 PM  Visual Foot Exam completed.:  Yes  Is there swelling or and abnormal foot shape?:  No Pulse Foot Exam completed.:  Yes  Right Dorsalis Pedis:  Present Left Dorsalis Pedis:  Present  Sensory Foot Exam Completed.:  Yes Semmes-Weinstein Monofilament Test R Site 1-Great Toe:  Pos L Site 1-Great Toe:  Pos          Assessment & Plan:   Problem List Items Addressed This Visit      Cardiovascular and  Mediastinum   Hypertension    Low pressures with symptoms of orthostatic hypotension; will decrease the ACE-I; close f/u      Relevant Medications   quinapril (ACCUPRIL) 5 MG tablet     Endocrine   Controlled diabetes mellitus with diabetic nephropathy ([place]) - Primary    Foot exam by MD; check A1c and glucose; work on weight loss; on ACE-I      Relevant Medications   quinapril (ACCUPRIL) 5 MG tablet   Other Relevant Orders   Lipid panel (Completed)   Hemoglobin A1c (Completed)     Genitourinary   Chronic kidney disease (CKD), stage II (mild) (Chronic)    Keep close eye on kidneys; check Cr and BUN; hydrate; continue ACE-I but at lower dose        Other   Morbid obesity ([place])    BMI >35 plus diabetes = morbid obesity; encouraged weight loss      Hyperlipidemia LDL goal <100 (Chronic)    He is not interested in decreasing the sausage; check lipids and SGPT      Relevant Medications   quinapril (ACCUPRIL) 5 MG tablet   Other Relevant Orders   Lipid panel (Completed)   Encounter for medication monitoring    Check labs      Relevant Orders   COMPLETE METABOLIC PANEL WITH GFR (Completed)    Other Visit Diagnoses    Orthostatic hypotension       decrease ACE-I and recheck BP next week   Relevant Medications   quinapril (ACCUPRIL) 5 MG tablet   Anemia, unspecified type       Relevant Orders   CBC with Differential/Platelet (Completed)      Follow up plan: Return in about 4 months (around 12/28/2016) for twenty minute follow-up with fasting labs; 1 week with CMA for BP check.  An after-visit summary was printed and given to the patient at [place].  Please see the patient instructions which may contain other information and recommendations beyond what is mentioned above in the assessment and plan.  Meds ordered this encounter  Medications  . quinapril (ACCUPRIL) 5 MG tablet    Sig: Take 1 tablet (5 mg total) by mouth daily.    Dispense:  30 tablet    Refill:   5    Reducing dose    Orders Placed This Encounter  Procedures  . Lipid panel  . Hemoglobin A1c  . COMPLETE METABOLIC PANEL WITH GFR  . CBC with Differential/Platelet

## 2016-08-28 NOTE — Patient Instructions (Addendum)
Let's get labs today Decrease the quinapril from 10 mg to 5 mg daily and return in one week to recheck your pressure (with Amber or Roselyn Reef) Try to increase your fluid intake, 64 ounces through the day of water or decaf beverages Check out the information at familydoctor.org entitled "Nutrition for Weight Loss: What You Need to Know about Fad Diets" Try to lose between 1-2 pounds per week by taking in fewer calories and burning off more calories You can succeed by limiting portions, limiting foods dense in calories and fat, becoming more active, and drinking 8 glasses of water a day (64 ounces) Don't skip meals, especially breakfast, as skipping meals may alter your metabolism Do not use over-the-counter weight loss pills or gimmicks that claim rapid weight loss A healthy BMI (or body mass index) is between 18.5 and 24.9 You can calculate your ideal BMI at the [place] website [employer]fr

## 2016-08-28 NOTE — Assessment & Plan Note (Signed)
Keep close eye on kidneys; check Cr and BUN; hydrate; continue ACE-I but at lower dose

## 2016-08-28 NOTE — Assessment & Plan Note (Signed)
Check labs 

## 2016-08-28 NOTE — Assessment & Plan Note (Signed)
Low pressures with symptoms of orthostatic hypotension; will decrease the ACE-I; close f/u

## 2016-08-29 LAB — HEMOGLOBIN A1C
Hgb A1c MFr Bld: 5.9 % — ABNORMAL HIGH (ref <5.7)
MEAN PLASMA GLUCOSE: 123 mg/dL

## 2016-08-31 ENCOUNTER — Other Ambulatory Visit: Payer: Self-pay | Admitting: Family Medicine

## 2016-08-31 MED ORDER — ROSUVASTATIN CALCIUM 20 MG PO TABS: 10.0000 mg | ORAL_TABLET | Freq: Every day | ORAL | 1 refills | Status: DC

## 2016-08-31 NOTE — Progress Notes (Signed)
anemic

## 2016-09-04 ENCOUNTER — Encounter: Payer: Self-pay | Admitting: Family Medicine

## 2016-09-04 ENCOUNTER — Ambulatory Visit (INDEPENDENT_AMBULATORY_CARE_PROVIDER_SITE_OTHER): Payer: Medicare Other | Admitting: Family Medicine

## 2016-09-04 VITALS — BP 112/58 | HR 96 | Temp 97.7°F | Resp 16 | Wt 280.2 lb

## 2016-09-04 DIAGNOSIS — N289 Disorder of kidney and ureter, unspecified: Secondary | ICD-10-CM

## 2016-09-04 DIAGNOSIS — D649 Anemia, unspecified: Secondary | ICD-10-CM | POA: Diagnosis not present

## 2016-09-04 DIAGNOSIS — E1121 Type 2 diabetes mellitus with diabetic nephropathy: Secondary | ICD-10-CM | POA: Diagnosis not present

## 2016-09-04 DIAGNOSIS — R195 Other fecal abnormalities: Secondary | ICD-10-CM | POA: Diagnosis not present

## 2016-09-04 LAB — BASIC METABOLIC PANEL
BUN: 24 mg/dL (ref 7–25)
CO2: 25 mmol/L (ref 20–31)
CREATININE: 1.3 mg/dL — AB (ref 0.70–1.18)
Calcium: 9.9 mg/dL (ref 8.6–10.3)
Chloride: 101 mmol/L (ref 98–110)
GLUCOSE: 104 mg/dL — AB (ref 65–99)
Potassium: 4.6 mmol/L (ref 3.5–5.3)
Sodium: 138 mmol/L (ref 135–146)

## 2016-09-04 LAB — CBC WITH DIFFERENTIAL/PLATELET
BASOS PCT: 1 %
Basophils Absolute: 74 cells/uL (ref 0–200)
EOS PCT: 2 %
Eosinophils Absolute: 148 cells/uL (ref 15–500)
HCT: 29.7 % — ABNORMAL LOW (ref 38.5–50.0)
Hemoglobin: 8.8 g/dL — ABNORMAL LOW (ref 13.2–17.1)
LYMPHS PCT: 36 %
Lymphs Abs: 2664 cells/uL (ref 850–3900)
MCH: 24.4 pg — ABNORMAL LOW (ref 27.0–33.0)
MCHC: 29.6 g/dL — ABNORMAL LOW (ref 32.0–36.0)
MCV: 82.5 fL (ref 80.0–100.0)
MONOS PCT: 7 %
MPV: 8.4 fL (ref 7.5–12.5)
Monocytes Absolute: 518 cells/uL (ref 200–950)
NEUTROS ABS: 3996 {cells}/uL (ref 1500–7800)
Neutrophils Relative %: 54 %
PLATELETS: 323 10*3/uL (ref 140–400)
RBC: 3.6 MIL/uL — AB (ref 4.20–5.80)
RDW: 15 % (ref 11.0–15.0)
WBC: 7.4 10*3/uL (ref 3.8–10.8)

## 2016-09-04 LAB — HEMOCCULT GUIAC POC 1CARD (OFFICE): FECAL OCCULT BLD: POSITIVE — AB

## 2016-09-04 LAB — IRON AND TIBC
%SAT: 4 % — ABNORMAL LOW (ref 15–60)
IRON: 15 ug/dL — AB (ref 50–180)
TIBC: 383 ug/dL (ref 250–425)
UIBC: 368 ug/dL

## 2016-09-04 NOTE — Assessment & Plan Note (Addendum)
Explained diagnosis, heme positive stool; need to evaluate for GI bleeding, risk of colon cancer or other GI malignancy; he refuses to get a colonoscopy; will get abd/pelvic CT scan to start and then refer to surgeon most likely; reasons to go to ER reviewed; recheck CBC today, along with ferritin, iron, TIBC; start iron pill

## 2016-09-04 NOTE — Patient Instructions (Addendum)
STOP metformin We'll get a CT scan of your abdomen next week If you have blood from your rectum or develop abdominal pain, call us or go to the emergency department I'll see you back soon for follow-up

## 2016-09-04 NOTE — Assessment & Plan Note (Signed)
Will stop the metformin for now given his renal insufficiency plus significant diarrhea; may have partial bowel obstruction if only looser stool getting by obstruction, but will stop metformin while working this up; also, he'll be getting contrast for CT so I'm more comfortable just stopping it completely than risk him taking it

## 2016-09-04 NOTE — Progress Notes (Signed)
BP (!) 112/58   Pulse 96   Temp 97.7 F (36.5 C) (Oral)   Resp 16   Wt 280 lb 3.2 oz (127.1 kg)   SpO2 98%   BMI 39.08 kg/m    Subjective:    Patient ID: [name], male    DOB: 1946-03-11, 71 y.o.   MRN: [id number]  HPI: Bostyn Bogie is a 71 y.o. male  Chief Complaint  Patient presents with  . Hypertension    Bp recheck  . Referral    discuss labs and GI referral, patient is very nervouse   HPI Patient is here for blood pressure recheck and to review labs (which showed significant anemia)  Patient recalls an episode on April 18th, bloody diarrhea for hours; 4 pounds that night; had more diarrhea the next night and lost 2 more pounds No dark stools since then; no blood that he can tell Reviewed CBC with him; he did not want any work-up done 4 months ago when Hbg was mildly low Has "butt itching" some times; feels like something wants to come out, like hemorrhoids or stool  Labs showed anemia; patient started taking ferrous sulfate 65 mg daily and B complex daily No dizziness  His BP medicine was adjusted last visit and he is taking the lower dose  He has diabetes; takes metformin, loose stools; usually loose and splatters; pt sees Dr. Jacqlyn Larsen for his urologist next week; Dr. Jacqlyn Larsen told him that the metformin was the cause of his loose stools  Depression screen [facility] 2/9 08/28/2016 04/28/2016 12/23/2015 12/18/2014  Decreased Interest 0 0 0 0  Down, Depressed, Hopeless 0 0 0 0  PHQ - 2 Score 0 0 0 0   Relevant past medical, surgical, family and social history reviewed Past Medical History:  Diagnosis Date  . Allergy   . Diabetes mellitus without complication ([place])   . Hyperlipidemia   . Hypertension   . Kidney stones   . Obesity 08/20/2015   Past Surgical History:  Procedure Laterality Date  . kidney stones     Family History  Problem Relation Age of Onset  . Heart disease Father   . Diabetes Father    Social History   Social History  . Marital  status: Married    Spouse name: N/A  . Number of children: N/A  . Years of education: N/A   Occupational History  . Not on file.   Social History Main Topics  . Smoking status: Former Research scientist (life sciences)  . Smokeless tobacco: Never Used  . Alcohol use No  . Drug use: No  . Sexual activity: Not Currently   Other Topics Concern  . Not on file   Social History Narrative  . No narrative on file   Interim medical history since last visit reviewed. Allergies and medications reviewed  Review of Systems Per HPI unless specifically indicated above      Objective:    BP (!) 112/58   Pulse 96   Temp 97.7 F (36.5 C) (Oral)   Resp 16   Wt 280 lb 3.2 oz (127.1 kg)   SpO2 98%   BMI 39.08 kg/m   Wt Readings from Last 3 Encounters:  09/04/16 280 lb 3.2 oz (127.1 kg)  08/28/16 275 lb 11.2 oz (125.1 kg)  04/28/16 272 lb 3.2 oz (123.5 kg)    Physical Exam  Constitutional: He appears well-developed and well-nourished. No distress.  Obese, weight gain 7+ pounds over the last 4+ months  HENT:  Head: Normocephalic and atraumatic.  Eyes: EOM are normal. No scleral icterus.  Neck: No thyromegaly present.  Cardiovascular: Normal rate and regular rhythm.   Pulmonary/Chest: Effort normal and breath sounds normal. He has no wheezes.  Abdominal: Soft. Bowel sounds are normal. He exhibits no distension. There is tenderness. There is guarding (equivocal guarding on the left side, left colic gutter to left lower quadrant).  Large pannus  Genitourinary: Rectal exam shows external hemorrhoid (external hemorrhoidal tag) and guaiac positive stool (very positive). Rectal exam shows no internal hemorrhoid, no fissure, no mass, no tenderness and anal tone normal. Prostate is enlarged. Prostate is not tender.  Musculoskeletal: He exhibits no edema.  Neurological: He is alert.  Skin: Skin is warm and dry. No pallor.  Psychiatric: His behavior is normal. Judgment and thought content normal. His mood appears  anxious.  Jokes, but openly admits his anxiety about having a colonoscopy done   Results for orders placed or performed in visit on 09/04/16  POCT occult blood stool  Result Value Ref Range   Fecal Occult Blood, POC Positive (A) Negative   Card #1 Date     Card #2 Fecal Occult Blod, POC     Card #2 Date     Card #3 Fecal Occult Blood, POC     Card #3 Date        Assessment & Plan:   Problem List Items Addressed This Visit      Endocrine   Controlled diabetes mellitus with diabetic nephropathy ([place])    Will stop the metformin for now given his renal insufficiency plus significant diarrhea; may have partial bowel obstruction if only looser stool getting by obstruction, but will stop metformin while working this up; also, he'll be getting contrast for CT so I'm more comfortable just stopping it completely than risk him taking it        Other   Anemia    Explained diagnosis, heme positive stool; need to evaluate for GI bleeding, risk of colon cancer or other GI malignancy; he refuses to get a colonoscopy; will get abd/pelvic CT scan to start and then refer to surgeon most likely; reasons to go to ER reviewed; recheck CBC today, along with ferritin, iron, TIBC; start iron pill      Relevant Medications   Iron-Vitamin C (IRON 100/C PO)   vitamin B-12 (CYANOCOBALAMIN) 100 MCG tablet   Other Relevant Orders   CBC with Differential/Platelet   Ferritin   Iron Binding Cap (TIBC)   Iron   CT Abdomen Pelvis W Contrast   POCT occult blood stool (Completed)    Other Visit Diagnoses    Renal insufficiency    -  Primary   recheck creatinine today; STOP metformin   Relevant Orders   Basic Metabolic Panel (BMET)   Heme positive stool       very positive Guaiac card, though not blood noted grossly; discussed implication; need for work-up, r/o GI malignancy; pt does not want colonoscopy; get CT   Relevant Orders   CT Abdomen Pelvis W Contrast       Follow up plan: Return in about 11  days (around 09/15/2016) for follow-up visit with Dr. Sanda Klein.  An after-visit summary was printed and given to the patient at [place].  Please see the patient instructions which may contain other information and recommendations beyond what is mentioned above in the assessment and plan.  Meds ordered this encounter  Medications  . Iron-Vitamin C (IRON 100/C PO)  Sig: Take 65 mg by mouth daily.  . vitamin B-12 (CYANOCOBALAMIN) 100 MCG tablet    Sig: Take 100 mcg by mouth daily.    Orders Placed This Encounter  Procedures  . CT Abdomen Pelvis W Contrast  . CBC with Differential/Platelet  . Ferritin  . Iron Binding Cap (TIBC)  . Iron  . Basic Metabolic Panel (BMET)  . POCT occult blood stool

## 2016-09-05 LAB — FERRITIN: FERRITIN: 6 ng/mL — AB (ref 20–380)

## 2016-09-07 ENCOUNTER — Telehealth: Payer: Self-pay

## 2016-09-07 NOTE — Telephone Encounter (Signed)
I called this patient to inform him that he has been scheduled to have his CT on Thursday, September 10, 2016 at 9:30am at the [facility], but there was no answer.  A message was left for this patient with that information on it and the number to scheduling [phone]) in case he needed to switch days and/ or times.

## 2016-09-08 ENCOUNTER — Other Ambulatory Visit: Payer: Self-pay

## 2016-09-08 DIAGNOSIS — D649 Anemia, unspecified: Secondary | ICD-10-CM

## 2016-09-08 DIAGNOSIS — N401 Enlarged prostate with lower urinary tract symptoms: Secondary | ICD-10-CM | POA: Diagnosis not present

## 2016-09-08 DIAGNOSIS — R339 Retention of urine, unspecified: Secondary | ICD-10-CM | POA: Diagnosis not present

## 2016-09-08 DIAGNOSIS — Z8042 Family history of malignant neoplasm of prostate: Secondary | ICD-10-CM | POA: Diagnosis not present

## 2016-09-08 DIAGNOSIS — N200 Calculus of kidney: Secondary | ICD-10-CM | POA: Diagnosis not present

## 2016-09-08 DIAGNOSIS — K922 Gastrointestinal hemorrhage, unspecified: Secondary | ICD-10-CM | POA: Insufficient documentation

## 2016-09-09 LAB — CBC WITH DIFFERENTIAL/PLATELET
BASOS PCT: 1 %
Basophils Absolute: 58 cells/uL (ref 0–200)
EOS ABS: 174 {cells}/uL (ref 15–500)
Eosinophils Relative: 3 %
HCT: 28.3 % — ABNORMAL LOW (ref 38.5–50.0)
Hemoglobin: 8.6 g/dL — ABNORMAL LOW (ref 13.2–17.1)
LYMPHS PCT: 33 %
Lymphs Abs: 1914 cells/uL (ref 850–3900)
MCH: 25.1 pg — ABNORMAL LOW (ref 27.0–33.0)
MCHC: 30.4 g/dL — ABNORMAL LOW (ref 32.0–36.0)
MCV: 82.7 fL (ref 80.0–100.0)
MONOS PCT: 7 %
MPV: 8.3 fL (ref 7.5–12.5)
Monocytes Absolute: 406 cells/uL (ref 200–950)
Neutro Abs: 3248 cells/uL (ref 1500–7800)
Neutrophils Relative %: 56 %
PLATELETS: 306 10*3/uL (ref 140–400)
RBC: 3.42 MIL/uL — ABNORMAL LOW (ref 4.20–5.80)
RDW: 15.3 % — AB (ref 11.0–15.0)
WBC: 5.8 10*3/uL (ref 3.8–10.8)

## 2016-09-10 ENCOUNTER — Ambulatory Visit
Admission: RE | Admit: 2016-09-10 | Discharge: 2016-09-10 | Disposition: A | Discharge status: Home / Self Care | Payer: Medicare Other | Source: Ambulatory Visit | Attending: Family Medicine | Admitting: Family Medicine

## 2016-09-10 DIAGNOSIS — K921 Melena: Secondary | ICD-10-CM | POA: Diagnosis present

## 2016-09-10 DIAGNOSIS — D649 Anemia, unspecified: Secondary | ICD-10-CM | POA: Insufficient documentation

## 2016-09-10 DIAGNOSIS — I700 Atherosclerosis of aorta: Secondary | ICD-10-CM | POA: Diagnosis not present

## 2016-09-10 DIAGNOSIS — K5900 Constipation, unspecified: Secondary | ICD-10-CM | POA: Diagnosis not present

## 2016-09-10 DIAGNOSIS — I2510 Atherosclerotic heart disease of native coronary artery without angina pectoris: Secondary | ICD-10-CM | POA: Diagnosis not present

## 2016-09-10 DIAGNOSIS — R195 Other fecal abnormalities: Secondary | ICD-10-CM | POA: Diagnosis present

## 2016-09-10 MED ORDER — IOPAMIDOL (ISOVUE-300) INJECTION 61%
100.0000 mL | Freq: Once | INTRAVENOUS | Status: AC | PRN
  Administered 2016-09-10: 100 mL via INTRAVENOUS

## 2016-09-15 ENCOUNTER — Encounter: Payer: Self-pay | Admitting: Family Medicine

## 2016-09-15 ENCOUNTER — Ambulatory Visit (INDEPENDENT_AMBULATORY_CARE_PROVIDER_SITE_OTHER): Payer: Medicare Other | Admitting: Family Medicine

## 2016-09-15 ENCOUNTER — Telehealth: Payer: Self-pay | Admitting: Family Medicine

## 2016-09-15 VITALS — BP 122/60 | HR 98 | Temp 98.6°F | Resp 16 | Wt 279.2 lb

## 2016-09-15 DIAGNOSIS — R195 Other fecal abnormalities: Secondary | ICD-10-CM | POA: Diagnosis not present

## 2016-09-15 DIAGNOSIS — I2510 Atherosclerotic heart disease of native coronary artery without angina pectoris: Secondary | ICD-10-CM | POA: Diagnosis not present

## 2016-09-15 DIAGNOSIS — N281 Cyst of kidney, acquired: Secondary | ICD-10-CM | POA: Diagnosis not present

## 2016-09-15 DIAGNOSIS — I700 Atherosclerosis of aorta: Secondary | ICD-10-CM | POA: Diagnosis not present

## 2016-09-15 DIAGNOSIS — D649 Anemia, unspecified: Secondary | ICD-10-CM | POA: Diagnosis not present

## 2016-09-15 HISTORY — DX: Atherosclerosis of aorta: I70.0

## 2016-09-15 HISTORY — DX: Atherosclerotic heart disease of native coronary artery without angina pectoris: I25.10

## 2016-09-15 LAB — CBC WITH DIFFERENTIAL/PLATELET
BASOS PCT: 0 %
Basophils Absolute: 0 cells/uL (ref 0–200)
EOS ABS: 204 {cells}/uL (ref 15–500)
Eosinophils Relative: 3 %
HCT: 30 % — ABNORMAL LOW (ref 38.5–50.0)
Hemoglobin: 8.7 g/dL — ABNORMAL LOW (ref 13.2–17.1)
Lymphocytes Relative: 37 %
Lymphs Abs: 2516 cells/uL (ref 850–3900)
MCH: 24.5 pg — ABNORMAL LOW (ref 27.0–33.0)
MCHC: 29 g/dL — ABNORMAL LOW (ref 32.0–36.0)
MCV: 84.5 fL (ref 80.0–100.0)
MONO ABS: 340 {cells}/uL (ref 200–950)
MONOS PCT: 5 %
MPV: 8.3 fL (ref 7.5–12.5)
NEUTROS ABS: 3740 {cells}/uL (ref 1500–7800)
Neutrophils Relative %: 55 %
PLATELETS: 307 10*3/uL (ref 140–400)
RBC: 3.55 MIL/uL — AB (ref 4.20–5.80)
RDW: 15.8 % — ABNORMAL HIGH (ref 11.0–15.0)
WBC: 6.8 10*3/uL (ref 3.8–10.8)

## 2016-09-15 MED ORDER — ALLOPURINOL 300 MG PO TABS: 300.0000 mg | ORAL_TABLET | Freq: Every day | ORAL | 3 refills | Status: DC

## 2016-09-15 MED ORDER — GLIPIZIDE ER 10 MG PO TB24: 10.0000 mg | ORAL_TABLET | Freq: Two times a day (BID) | ORAL | 1 refills | Status: DC

## 2016-09-15 NOTE — Assessment & Plan Note (Addendum)
Likely due to GI bleed; significant time spent talking with patient about colonoscopy, risk of AVM, cancer, diverticular bleed, won't know unless this is worked up; he reluctantly agrees to see a GI specialist to have a colonoscopy (and possible EGD); checking CBC today; to ER if feeling worse, weak; holding aspirin and metformin for now

## 2016-09-15 NOTE — Assessment & Plan Note (Signed)
Encouraged healthy eating, weight loss

## 2016-09-15 NOTE — Telephone Encounter (Signed)
[place] mailed

## 2016-09-15 NOTE — Telephone Encounter (Signed)
Pt asking that you please mail him a copy of his test results (his scan)

## 2016-09-15 NOTE — Patient Instructions (Addendum)
You will need further work-up of your kidneys If you have not heard from either my office or Dr. Bjorn Loser office in one week about another scan of your kidneys, then call us here at [phone]  [facility] have you see Dr. Allen Norris, the gastroenterologist If you have not heard about that appointment by Thursday July 5th, please call us here  Increase glipizide to twice a day If your sugasr aren't staying under 200 by next week, call me  If you develop more bleeding, feel weak or tired, or develop significant abdominal pain, go to the emergency department

## 2016-09-15 NOTE — Assessment & Plan Note (Signed)
Noted on CT scan; Dr. Jacqlyn Larsen will review; some appear complicated; discussed that he should f/u with urologist

## 2016-09-15 NOTE — Assessment & Plan Note (Signed)
Discussed with patient

## 2016-09-15 NOTE — Progress Notes (Signed)
 BP 122/60   Pulse 98   Temp 98.6 F (37 C) (Oral)   Resp 16   Wt 279 lb 3.2 oz (126.6 kg)   SpO2 96%   BMI 38.94 kg/m    Subjective:    Patient ID: Donald F Backhaus Jr., male    DOB: 08/09/1945, 71 y.o.   MRN: [id number]  HPI: Donald F Babers Jr. is a 71 y.o. male  Chief Complaint  Patient presents with  . Results    CT  . Follow-up    HPI  Patient is here for follow-up and to get his CT scan results; I have been away on vacation and am just returning today He is not feeling sick No major weight loss No night sweats; no fevers He sees a urologist, Dr. Cope, and patient says that urologist is going to look at his scan too  ------------------------------------------------- Abd/pelv CT scan September 10, 2016: CLINICAL DATA:  Intermittent severe constipation with some blood in the stool over the last 10 weeks, history of kidney stones  EXAM: CT ABDOMEN AND PELVIS WITH CONTRAST  TECHNIQUE: Multidetector CT imaging of the abdomen and pelvis was performed using the standard protocol following bolus administration of intravenous contrast.  CONTRAST:  100mL ISOVUE-300 IOPAMIDOL (ISOVUE-300) INJECTION 61%  COMPARISON:  Abdomen films of 07/17/2013  FINDINGS: Lower chest: The lung bases are clear. The heart is within normal limits in size. Diffuse coronary artery calcifications are present.  Hepatobiliary: The liver enhances no focal abnormality and no ductal dilatation is seen. No calcified gallstones are seen.  Pancreas: The pancreas is normal in size and the pancreatic duct is not dilated.  Spleen: The spleen is unremarkable.  Adrenals/Urinary Tract: The adrenal glands are slightly nodular which may indicate the presence of small adrenal adenomas. The kidneys enhance and there are multiple rounded low-attenuation structures involving both kidneys most likely representing cysts a few which may be slightly complicated. Ultrasound or preferably MRI would  be helpful to assess further. Small nonobstructing right lower pole renal calculi are present. The ureters are normal in caliber. The urinary bladder is not well distended.  Stomach/Bowel: The stomach is mildly distended with oral contrast. No small bowel distention is seen. There is a moderate amount of feces throughout the colon. The terminal ileum and the appendix are unremarkable.  Vascular/Lymphatic: Moderate abdominal aortic atherosclerosis is present. No adenopathy is seen.  Reproductive: The prostate is within normal limits in size.  Other: None.  Musculoskeletal: The lumbar vertebrae are in normal alignment. There is mild degenerative disc disease at L4-5.  IMPRESSION: 1. No explanation for the patient's blood in stool is seen. There is some feces scattered throughout the colon. No significant diverticula formation is noted. 2. Multiple slightly complex renal structures consistent with cysts. Consider ultrasound or MRI to assess further. 3. Diffuse coronary artery calcifications. 4. Moderate abdominal aortic atherosclerosis.   Electronically Signed   By: Paul  Barry M.D.   On: 09/10/2016 10:36 --------------------------------------------------- Discussed findings Moderate stool burden; moving bowels every morning; normal in appearance; not bloody or dark Quit taking the 81 mg aspirin and no further bleeding noted Stopped the metformin and no more loose stools Glucose is staying high at 200 to 300 even with medicine BP last check was 150/70 on the lower dose of ACE-I  He had a CBC done while I was away and this was checked by one of my colleagues  Depression screen PHQ 2/9 08/28/2016 04/28/2016 12/23/2015 12/18/2014  Decreased Interest 0 0   0 0  Down, Depressed, Hopeless 0 0 0 0  PHQ - 2 Score 0 0 0 0   Relevant past medical, surgical, family and social history reviewed Past Medical History:  Diagnosis Date  . Abdominal aortic atherosclerosis ([facility])  09/15/2016   CT scan June 2018  . Allergy   . Coronary artery calcification seen on CAT scan 09/15/2016   Previous smoker; noted on CT scan June 2018  . Diabetes mellitus without complication ([facility])   . Hyperlipidemia   . Hypertension   . Kidney stones   . Obesity 08/20/2015   Past Surgical History:  Procedure Laterality Date  . kidney stones     Family History  Problem Relation Age of Onset  . Heart disease Father   . Diabetes Father    Social History   Social History  . Marital status: Married    Spouse name: N/A  . Number of children: N/A  . Years of education: N/A   Occupational History  . Not on file.   Social History Main Topics  . Smoking status: Former Smoker  . Smokeless tobacco: Never Used  . Alcohol use No  . Drug use: No  . Sexual activity: Not Currently   Other Topics Concern  . Not on file   Social History Narrative  . No narrative on file    Interim medical history since last visit reviewed. Allergies and medications reviewed  Review of Systems Per HPI unless specifically indicated above     Objective:    BP 122/60   Pulse 98   Temp 98.6 F (37 C) (Oral)   Resp 16   Wt 279 lb 3.2 oz (126.6 kg)   SpO2 96%   BMI 38.94 kg/m   Wt Readings from Last 3 Encounters:  09/15/16 279 lb 3.2 oz (126.6 kg)  09/04/16 280 lb 3.2 oz (127.1 kg)  08/28/16 275 lb 11.2 oz (125.1 kg)    Physical Exam  Constitutional: He appears well-developed and well-nourished. No distress.  Obese, weight loss one pound over last 11 days  HENT:  Head: Normocephalic and atraumatic.  Eyes: EOM are normal. No scleral icterus.  Neck: No thyromegaly present.  Cardiovascular: Normal rate and regular rhythm.   Pulmonary/Chest: Effort normal and breath sounds normal.  Abdominal: Soft. Bowel sounds are normal. He exhibits no distension. There is no tenderness. There is no guarding.  Musculoskeletal: He exhibits no edema.  Neurological: Coordination normal.  Skin: Skin is warm  and dry. He is not diaphoretic.  Nailbeds are pale  Psychiatric: He has a normal mood and affect. His behavior is normal. Judgment and thought content normal.   Results for orders placed or performed in visit on 09/08/16  CBC with Differential/Platelet  Result Value Ref Range   WBC 5.8 3.8 - 10.8 K/uL   RBC 3.42 (L) 4.20 - 5.80 MIL/uL   Hemoglobin 8.6 (L) 13.2 - 17.1 g/dL   HCT 28.3 (L) 38.5 - 50.0 %   MCV 82.7 80.0 - 100.0 fL   MCH 25.1 (L) 27.0 - 33.0 pg   MCHC 30.4 (L) 32.0 - 36.0 g/dL   RDW 15.3 (H) 11.0 - 15.0 %   Platelets 306 140 - 400 K/uL   MPV 8.3 7.5 - 12.5 fL   Neutro Abs 3,248 1,500 - 7,800 cells/uL   Lymphs Abs 1,914 850 - 3,900 cells/uL   Monocytes Absolute 406 200 - 950 cells/uL   Eosinophils Absolute 174 15 - 500 cells/uL   Basophils Absolute   58 0 - 200 cells/uL   Neutrophils Relative % 56 %   Lymphocytes Relative 33 %   Monocytes Relative 7 %   Eosinophils Relative 3 %   Basophils Relative 1 %   Smear Review Criteria for review not met       Assessment & Plan:   Problem List Items Addressed This Visit      Cardiovascular and Mediastinum   Coronary artery calcification seen on CAT scan    Avoid cigarette smoking; work on weight loss, healthy diet      Abdominal aortic atherosclerosis (HCC)    Discussed with patient        Genitourinary   Cyst, kidney, acquired    Noted on CT scan; Dr. Jacqlyn Larsen will review; some appear complicated; discussed that he should f/u with urologist        Other   Morbid obesity ([place])    Encouraged healthy eating, weight loss      Relevant Medications   glipiZIDE (GLUCOTROL XL) 10 MG 24 hr tablet   Anemia    Likely due to GI bleed; significant time spent talking with patient about colonoscopy, risk of AVM, cancer, diverticular bleed, won't know unless this is worked up; he reluctantly agrees to see a GI specialist to have a colonoscopy (and possible EGD); checking CBC today; to ER if feeling worse, weak; holding aspirin  and metformin for now      Relevant Orders   CBC with Differential/Platelet   Ferritin   Ambulatory referral to Gastroenterology    Other Visit Diagnoses    Heme positive stool    -  Primary   refer to GI   Relevant Orders   Ambulatory referral to Gastroenterology       Follow up plan: Return in about 4 weeks (around 10/13/2016) for follow-up visit with Dr. Sanda Klein.  An after-visit summary was printed and given to the patient at [place].  Please see the patient instructions which may contain other information and recommendations beyond what is mentioned above in the assessment and plan.  Meds ordered this encounter  Medications  . glipiZIDE (GLUCOTROL XL) 10 MG 24 hr tablet    Sig: Take 1 tablet (10 mg total) by mouth 2 (two) times daily.    Dispense:  180 tablet    Refill:  1  . allopurinol (ZYLOPRIM) 300 MG tablet    Sig: Take 1 tablet (300 mg total) by mouth daily.    Dispense:  90 tablet    Refill:  3    Orders Placed This Encounter  Procedures  . CBC with Differential/Platelet  . Ferritin  . Ambulatory referral to Gastroenterology

## 2016-09-15 NOTE — Assessment & Plan Note (Signed)
Avoid cigarette smoking; work on weight loss, healthy diet

## 2016-09-16 LAB — FERRITIN: FERRITIN: 49 ng/mL (ref 20–380)

## 2016-09-17 ENCOUNTER — Other Ambulatory Visit: Payer: Self-pay | Admitting: Family Medicine

## 2016-09-24 ENCOUNTER — Telehealth: Payer: Self-pay | Admitting: Family Medicine

## 2016-09-24 MED ORDER — METFORMIN HCL 1000 MG PO TABS: 500.0000 mg | ORAL_TABLET | Freq: Two times a day (BID) | ORAL | Status: DC

## 2016-09-24 NOTE — Telephone Encounter (Signed)
PER PATIENT AND JAMIE I AM TO SEND MESSAGE THAT THE DR STOPPED METFORMIN  AND INCREASED HIS GLIPIZIDE TO 2 PILLS TWO TIMES A DAY BUT SUGAR IS STILL SOMETIMES OVER 200. PLEASE ADVISE.

## 2016-09-24 NOTE — Telephone Encounter (Signed)
Patient notified

## 2016-09-24 NOTE — Telephone Encounter (Signed)
I reviewed last creatinine; bleeding appears to have stopped Will start him back on HALF of his previous dose, 1000 mg pills, take just HALF of a pill twice a day Please check on the GI referral that I placed on 09/15/16 for Heme positive stool, GI bleed, anemia; I'd really like to get him in soon Thank you

## 2016-09-25 ENCOUNTER — Telehealth: Payer: Self-pay | Admitting: Family Medicine

## 2016-09-25 NOTE — Telephone Encounter (Signed)
Called in.

## 2016-09-25 NOTE — Telephone Encounter (Signed)
PT WIFE SAID THAT THE PHARM DID NOT GET THE RX FOR THE PATIENT Donald Mcdowell. COULD YOU PLEASE CALL OR RESEND IT FOR THE PATIENT IS OUT. PLEASE LET THEM KNOW WHEN THIS HAS BEEN DONE PER PATIENTS WIFE.

## 2016-10-13 ENCOUNTER — Encounter: Payer: Self-pay | Admitting: Family Medicine

## 2016-10-13 ENCOUNTER — Ambulatory Visit (INDEPENDENT_AMBULATORY_CARE_PROVIDER_SITE_OTHER): Payer: Medicare Other | Admitting: Family Medicine

## 2016-10-13 DIAGNOSIS — I10 Essential (primary) hypertension: Secondary | ICD-10-CM

## 2016-10-13 DIAGNOSIS — K922 Gastrointestinal hemorrhage, unspecified: Secondary | ICD-10-CM

## 2016-10-13 DIAGNOSIS — N182 Chronic kidney disease, stage 2 (mild): Secondary | ICD-10-CM | POA: Diagnosis not present

## 2016-10-13 DIAGNOSIS — E1121 Type 2 diabetes mellitus with diabetic nephropathy: Secondary | ICD-10-CM | POA: Diagnosis not present

## 2016-10-13 LAB — CBC WITH DIFFERENTIAL/PLATELET
BASOS PCT: 0 %
Basophils Absolute: 0 cells/uL (ref 0–200)
Eosinophils Absolute: 134 cells/uL (ref 15–500)
Eosinophils Relative: 2 %
HEMATOCRIT: 33.7 % — AB (ref 38.5–50.0)
Hemoglobin: 10.2 g/dL — ABNORMAL LOW (ref 13.2–17.1)
LYMPHS ABS: 2077 {cells}/uL (ref 850–3900)
LYMPHS PCT: 31 %
MCH: 26.9 pg — ABNORMAL LOW (ref 27.0–33.0)
MCHC: 30.3 g/dL — ABNORMAL LOW (ref 32.0–36.0)
MCV: 88.9 fL (ref 80.0–100.0)
MONO ABS: 402 {cells}/uL (ref 200–950)
MPV: 8.6 fL (ref 7.5–12.5)
Monocytes Relative: 6 %
NEUTROS ABS: 4087 {cells}/uL (ref 1500–7800)
Neutrophils Relative %: 61 %
Platelets: 265 10*3/uL (ref 140–400)
RBC: 3.79 MIL/uL — ABNORMAL LOW (ref 4.20–5.80)
RDW: 20 % — AB (ref 11.0–15.0)
WBC: 6.7 10*3/uL (ref 3.8–10.8)

## 2016-10-13 LAB — BASIC METABOLIC PANEL WITH GFR
BUN: 22 mg/dL (ref 7–25)
CHLORIDE: 100 mmol/L (ref 98–110)
CO2: 26 mmol/L (ref 20–31)
CREATININE: 1.23 mg/dL — AB (ref 0.70–1.18)
Calcium: 9.6 mg/dL (ref 8.6–10.3)
GFR, EST NON AFRICAN AMERICAN: 59 mL/min — AB (ref >=60)
GFR, Est African American: 68 mL/min (ref >=60)
Glucose, Bld: 115 mg/dL — ABNORMAL HIGH (ref 65–99)
POTASSIUM: 4.4 mmol/L (ref 3.5–5.3)
SODIUM: 138 mmol/L (ref 135–146)

## 2016-10-13 NOTE — Progress Notes (Signed)
BP 110/60   Pulse 94   Temp 98.3 F (36.8 C) (Oral)   Resp 16   Wt 277 lb 11.2 oz (126 kg)   SpO2 96%   BMI 38.73 kg/m    Subjective:    Patient ID: Donald Pou., male    DOB: Jul 28, 1945, 71 y.o.   MRN: [id number]  HPI: Donald Mcdowell is a 71 y.o. male  Chief Complaint  Patient presents with  . Follow-up   HPI Patient is here for follow-up He has seen some more bleeding; started Thursday night around 11:30 pm; went to the bathroom 6-7 times over night; not as much blood as it was before; last time was 10-12 times; this last bloody stool was Thursday into Friday morning; none since then Feels fine; not tired or SHOB Trying to walk every day, trying to walk a mile without stopping; sometimes walks half a mile and then stops; no chest pain He has been taking magnesium 250 mg daily; also taking iron, and he read that it can cause diarrhea; he quit the magnesium, still taking iron; taking iron daily He has anemia; last two CBC and ferritin levels checked He does not have any abdominal pain; he might drink ginger ale and then lay down and his stomach "gripes", feels better after BMs Drinking adequate water Type 2 diabetes; no problems with sensation; last A1c was excellent in June (5.9); sugar was 155 this morning; 296 was the highest lately CKD, stage 2-3; no NSAIDs; he takes tylenol for pain with knee after walking Taking statin for high cholesterol; total 94, TG 104, HDL 38, LDL 35  Depression screen [name] 2/9 10/13/2016 08/28/2016 04/28/2016 12/23/2015 12/18/2014  Decreased Interest 0 0 0 0 0  Down, Depressed, Hopeless 0 0 0 0 0  PHQ - 2 Score 0 0 0 0 0   Relevant past medical, surgical, family and social history reviewed Past Medical History:  Diagnosis Date  . Abdominal aortic atherosclerosis ([place]) 09/15/2016   CT scan June 2018  . Allergy   . Coronary artery calcification seen on CAT scan 09/15/2016   Previous smoker; noted on CT scan June 2018  . Diabetes  mellitus without complication ([place])   . Hyperlipidemia   . Hypertension   . Kidney stones   . Obesity 08/20/2015   Past Surgical History:  Procedure Laterality Date  . kidney stones     Family History  Problem Relation Age of Onset  . Alzheimer's disease Mother   . Heart disease Father   . Diabetes Father   . Cancer Sister        skin cancer   Social History   Social History  . Marital status: Married    Spouse name: N/A  . Number of children: N/A  . Years of education: N/A   Occupational History  . Not on file.   Social History Main Topics  . Smoking status: Former Research scientist (life sciences)  . Smokeless tobacco: Never Used  . Alcohol use No  . Drug use: No  . Sexual activity: Not Currently   Other Topics Concern  . Not on file   Social History Narrative  . No narrative on file    Interim medical history since last visit reviewed. Allergies and medications reviewed  Review of Systems Per HPI unless specifically indicated above     Objective:    BP 110/60   Pulse 94   Temp 98.3 F (36.8 C) (Oral)   Resp 16  Wt 277 lb 11.2 oz (126 kg)   SpO2 96%   BMI 38.73 kg/m   Wt Readings from Last 3 Encounters:  10/13/16 277 lb 11.2 oz (126 kg)  09/15/16 279 lb 3.2 oz (126.6 kg)  09/04/16 280 lb 3.2 oz (127.1 kg)    Physical Exam  Constitutional: He appears well-developed and well-nourished. No distress.  Obese, weight overall stable  HENT:  Head: Normocephalic and atraumatic.  Eyes: EOM are normal. No scleral icterus.  Neck: No thyromegaly present.  Cardiovascular: Normal rate and regular rhythm.   Pulmonary/Chest: Effort normal and breath sounds normal.  Abdominal: Soft. Bowel sounds are normal. He exhibits no distension. There is no tenderness. There is no guarding.  Musculoskeletal: He exhibits no edema.  Neurological: Coordination normal.  Skin: Skin is warm and dry. He is not diaphoretic.  Nailbeds are pale  Psychiatric: He has a normal mood and affect. His  behavior is normal. Judgment and thought content normal.   Diabetic Foot Form - Detailed   Diabetic Foot Exam - detailed Diabetic Foot exam was performed with the following findings:  Yes 10/13/2016  9:50 AM  Visual Foot Exam completed.:  Yes  Pulse Foot Exam completed.:  Yes  Right Dorsalis Pedis:  Present Left Dorsalis Pedis:  Present  Sensory Foot Exam Completed.:  Yes Semmes-Weinstein Monofilament Test R Site 1-Great Toe:  Pos L Site 1-Great Toe:  Pos        Results for orders placed or performed in visit on 09/15/16  CBC with Differential/Platelet  Result Value Ref Range   WBC 6.8 3.8 - 10.8 K/uL   RBC 3.55 (L) 4.20 - 5.80 MIL/uL   Hemoglobin 8.7 (L) 13.2 - 17.1 g/dL   HCT 30.0 (L) 38.5 - 50.0 %   MCV 84.5 80.0 - 100.0 fL   MCH 24.5 (L) 27.0 - 33.0 pg   MCHC 29.0 (L) 32.0 - 36.0 g/dL   RDW 15.8 (H) 11.0 - 15.0 %   Platelets 307 140 - 400 K/uL   MPV 8.3 7.5 - 12.5 fL   Neutro Abs 3,740 1,500 - 7,800 cells/uL   Lymphs Abs 2,516 850 - 3,900 cells/uL   Monocytes Absolute 340 200 - 950 cells/uL   Eosinophils Absolute 204 15 - 500 cells/uL   Basophils Absolute 0 0 - 200 cells/uL   Neutrophils Relative % 55 %   Lymphocytes Relative 37 %   Monocytes Relative 5 %   Eosinophils Relative 3 %   Basophils Relative 0 %   Smear Review Criteria for review not met   Ferritin  Result Value Ref Range   Ferritin 49 20 - 380 ng/mL      Assessment & Plan:   Problem List Items Addressed This Visit      Cardiovascular and Mediastinum   Hypertension    Continue current medicines        Digestive   Lower GI bleed    Will be getting in to see GI doctor soon; check CBC and ferritin today; advised patient to go to the ER if needed      Relevant Orders   CBC with Differential/Platelet   Fe+TIBC+Fer     Endocrine   Controlled diabetes mellitus with diabetic nephropathy ([place])    Foot exam by MD today; continue to work on weight loss and healthy eating; last A1c was excellent;  next due in October      Relevant Orders   BASIC METABOLIC PANEL WITH GFR  Genitourinary   Chronic kidney disease (CKD), stage II (mild) (Chronic)    Monitor creatinine and GFR; avoid NSAIDs      Relevant Orders   BASIC METABOLIC PANEL WITH GFR       Follow up plan: No Follow-up on file.  An after-visit summary was printed and given to the patient at [place].  Please see the patient instructions which may contain other information and recommendations beyond what is mentioned above in the assessment and plan.  No orders of the defined types were placed in this encounter.   Orders Placed This Encounter  Procedures  . BASIC METABOLIC PANEL WITH GFR  . CBC with Differential/Platelet  . Fe+TIBC+Fer

## 2016-10-13 NOTE — Assessment & Plan Note (Signed)
Will be getting in to see GI doctor soon; check CBC and ferritin today; advised patient to go to the ER if needed

## 2016-10-13 NOTE — Assessment & Plan Note (Signed)
Continue current medicines.

## 2016-10-13 NOTE — Assessment & Plan Note (Addendum)
Foot exam by MD today; continue to work on weight loss and healthy eating; last A1c was excellent; next due in October

## 2016-10-13 NOTE — Patient Instructions (Signed)
If you have significant bleeding, go to the emergency department or call us depending on the severity Check your feet every night See Dr. Allen Norris as soon as possible

## 2016-10-13 NOTE — Assessment & Plan Note (Signed)
Monitor creatinine and GFR; avoid NSAIDs

## 2016-10-14 LAB — IRON,TIBC AND FERRITIN PANEL
%SAT: 11 % — ABNORMAL LOW (ref 15–60)
Ferritin: 28 ng/mL (ref 20–380)
IRON: 33 ug/dL — AB (ref 50–180)
TIBC: 307 ug/dL (ref 250–425)

## 2016-10-26 ENCOUNTER — Ambulatory Visit: Payer: Medicare Other | Admitting: Gastroenterology

## 2016-10-27 ENCOUNTER — Encounter: Payer: Self-pay | Admitting: Gastroenterology

## 2016-10-27 ENCOUNTER — Ambulatory Visit (INDEPENDENT_AMBULATORY_CARE_PROVIDER_SITE_OTHER): Payer: Medicare Other | Admitting: Gastroenterology

## 2016-10-27 ENCOUNTER — Other Ambulatory Visit: Payer: Self-pay

## 2016-10-27 VITALS — BP 122/48 | HR 92 | Temp 97.8°F | Ht 71.0 in | Wt 285.6 lb

## 2016-10-27 DIAGNOSIS — K921 Melena: Secondary | ICD-10-CM | POA: Diagnosis not present

## 2016-10-27 DIAGNOSIS — D500 Iron deficiency anemia secondary to blood loss (chronic): Secondary | ICD-10-CM

## 2016-10-27 DIAGNOSIS — D509 Iron deficiency anemia, unspecified: Secondary | ICD-10-CM | POA: Diagnosis not present

## 2016-10-27 NOTE — Progress Notes (Signed)
Gastroenterology Consultation  Referring Provider:     Arnetha Courser, MD Primary Care Physician:  Arnetha Courser, MD Primary Gastroenterologist:  Dr. Allen Norris     Reason for Consultation:     Iron deficiency anemia        HPI:   Donald Dupre. is a 71 y.o. y/o male referred for consultation & management of Iron deficiency anemia by Dr. Sanda Klein, Satira Anis, MD.  This patient comes today with a history of iron deficiency anemia.  The patient states that he had an episode of rectal bleeding with diarrhea that persisted for sometime.  The patient has had his hemoglobin, up since then but is still under 11.  The patient also had iron studies with a low iron saturation and low iron.  The patient denies any further signs of bleeding.  There is no report of any unexplained weight loss fevers chills nausea or vomiting.  The patient also denies ever having a colonoscopy in the past.  The patient states that the only reason he is here today is because his urologist Dr. Jacqlyn Larsen who he reports to be a friend told him he needed to come for the visit.  The patient denies any diarrhea at the present time.  Past Medical History:  Diagnosis Date  . Abdominal aortic atherosclerosis ([place]) 09/15/2016   CT scan June 2018  . Allergy   . Coronary artery calcification seen on CAT scan 09/15/2016   Previous smoker; noted on CT scan June 2018  . Diabetes mellitus without complication ([place])   . Hyperlipidemia   . Hypertension   . Kidney stones   . Obesity 08/20/2015    Past Surgical History:  Procedure Laterality Date  . kidney stones      Prior to Admission medications   Medication Sig Start Date End Date Taking? Authorizing Provider  allopurinol (ZYLOPRIM) 300 MG tablet Take 1 tablet (300 mg total) by mouth daily. 09/15/16  Yes Lada, Satira Anis, MD  glipiZIDE (GLUCOTROL XL) 10 MG 24 hr tablet Take 1 tablet (10 mg total) by mouth 2 (two) times daily. 09/15/16  Yes Lada, Satira Anis, MD  glucose blood (ACCU-CHEK COMPACT  STRIPS) test strip 100 each by Other route as needed for other. Use as instructed, check FSBS once a day; LON 99 months, E11.9 03/19/16  Yes Lada, Satira Anis, MD  Iron-Vitamin C (IRON 100/C PO) Take 65 mg by mouth daily.   Yes [provider]  metFORMIN (GLUCOPHAGE) 1000 MG tablet Take 0.5 tablets (500 mg total) by mouth 2 (two) times daily. 09/24/16  Yes Lada, Satira Anis, MD  Omega-3 Fatty Acids (FISH OIL) 1200 MG CAPS Take by mouth 2 (two) times daily.   Yes [provider]  quinapril (ACCUPRIL) 5 MG tablet Take 1 tablet (5 mg total) by mouth daily. 08/28/16  Yes Lada, Satira Anis, MD  rosuvastatin (CRESTOR) 20 MG tablet Take 0.5 tablets (10 mg total) by mouth daily. 08/31/16  Yes Lada, Satira Anis, MD  tamsulosin (FLOMAX) 0.4 MG CAPS capsule Take 0.4 mg by mouth daily. 10/02/14  Yes [provider]  vitamin B-12 (CYANOCOBALAMIN) 100 MCG tablet Take 100 mcg by mouth daily.   Yes [provider]    Family History  Problem Relation Age of Onset  . Alzheimer's disease Mother   . Heart disease Father   . Diabetes Father   . Cancer Sister        skin cancer     Social  History  Substance Use Topics  . Smoking status: Former Research scientist (life sciences)  . Smokeless tobacco: Never Used  . Alcohol use No    Allergies as of 10/27/2016  . (No Known Allergies)    Review of Systems:    All systems reviewed and negative except where noted in HPI.   Physical Exam:  BP (!) 122/48   Pulse 92   Temp 97.8 F (36.6 C) (Oral)   Ht 5\' 11"  (1.803 m)   Wt 285 lb 9.6 oz (129.5 kg)   BMI 39.83 kg/m  No LMP for male patient. Psych:  Alert and cooperative. Normal mood and affect. General:   Alert,  Well-developed, well-nourished, pleasant and cooperative in NAD Head:  Normocephalic and atraumatic. Eyes:  Sclera clear, no icterus.   Conjunctiva pink. Ears:  Normal auditory acuity. Nose:  No deformity, discharge, or lesions. Mouth:  No deformity or lesions,oropharynx pink & moist. Neck:   Supple; no masses or thyromegaly. Lungs:  Respirations even and unlabored.  Clear throughout to auscultation.   No wheezes, crackles, or rhonchi. No acute distress. Heart:  Regular rate and rhythm; no murmurs, clicks, rubs, or gallops. Abdomen:  Normal bowel sounds.  No bruits.  Soft, non-tender and non-distended without masses, hepatosplenomegaly or hernias noted.  No guarding or rebound tenderness.  Negative Carnett sign.   Rectal:  Deferred.  Msk:  Symmetrical without gross deformities.  Good, equal movement & strength bilaterally. Pulses:  Normal pulses noted. Extremities:  No clubbing or edema.  No cyanosis. Neurologic:  Alert and oriented x3;  grossly normal neurologically. Skin:  Intact without significant lesions or rashes.  No jaundice. Lymph Nodes:  No significant cervical adenopathy. Psych:  Alert and cooperative. Normal mood and affect.  Imaging Studies: No results found.  Assessment and Plan:   Donald Carley. is a 71 y.o. y/o male who comes in today with a history of rectal bleeding and bloody diarrhea.  The patient's hemoglobin had dropped significant only but now has come up somewhat but not to normal.  The patient has never had a colonoscopy.  The patient will be set up for an EGD and colonoscopy due to his iron deficiency anemia. I have discussed risks & benefits which include, but are not limited to, bleeding, infection, perforation & drug reaction.  The patient agrees with this plan & written consent will be obtained.     Lucilla Lame, MD. Marval Regal   Note: This dictation was prepared with Dragon dictation along with smaller phrase technology. Any transcriptional errors that result from this process are unintentional.

## 2016-10-30 ENCOUNTER — Telehealth: Payer: Self-pay | Admitting: Gastroenterology

## 2016-10-30 ENCOUNTER — Other Ambulatory Visit: Payer: Self-pay

## 2016-10-30 MED ORDER — PEG 3350-KCL-NA BICARB-NACL 420 G PO SOLR: ORAL | 0 refills | Status: DC

## 2016-10-30 NOTE — Telephone Encounter (Signed)
Tried contacting pt to inform him I have changed his rx to [place] due to cost of Suprep. No answer on home phone. No answering machine.

## 2016-10-30 NOTE — Telephone Encounter (Signed)
Patient called [facility] in [place] and prep has not been called in . They stated they are waiting on auth for supprep. He is confused. Will you call him today?

## 2016-11-02 ENCOUNTER — Encounter: Payer: Self-pay | Admitting: *Deleted

## 2016-11-02 ENCOUNTER — Other Ambulatory Visit: Payer: Self-pay

## 2016-11-02 MED ORDER — METFORMIN HCL 1000 MG PO TABS: 500.0000 mg | ORAL_TABLET | Freq: Two times a day (BID) | ORAL | 5 refills | Status: DC

## 2016-11-02 NOTE — Telephone Encounter (Signed)
GFR 59; Rx approved

## 2016-11-02 NOTE — Telephone Encounter (Signed)
Pt notified cheaper bowel prep has been sent to his pharmacy.

## 2016-11-03 ENCOUNTER — Telehealth: Payer: Self-pay | Admitting: Gastroenterology

## 2016-11-03 NOTE — Telephone Encounter (Signed)
Patient called back and would like for you to call him today. I explained you were in clinic at this time.

## 2016-11-04 NOTE — Discharge Instructions (Signed)
General Anesthesia, Adult, Care After °These instructions provide you with information about caring for yourself after your procedure. Your health care provider may also give you more specific instructions. Your treatment has been planned according to current medical practices, but problems sometimes occur. Call your health care provider if you have any problems or questions after your procedure. °What can I expect after the procedure? °After the procedure, it is common to have: °· Vomiting. °· A sore throat. °· Mental slowness. ° °It is common to feel: °· Nauseous. °· Cold or shivery. °· Sleepy. °· Tired. °· Sore or achy, even in parts of your body where you did not have surgery. ° °Follow these instructions at home: °For at least 24 hours after the procedure: °· Do not: °? Participate in activities where you could fall or become injured. °? Drive. °? Use heavy machinery. °? Drink alcohol. °? Take sleeping pills or medicines that cause drowsiness. °? Make important decisions or sign legal documents. °? Take care of children on your own. °· Rest. °Eating and drinking °· If you vomit, drink water, juice, or soup when you can drink without vomiting. °· Drink enough fluid to keep your urine clear or pale yellow. °· Make sure you have little or no nausea before eating solid foods. °· Follow the diet recommended by your health care provider. °General instructions °· Have a responsible adult stay with you until you are awake and alert. °· Return to your normal activities as told by your health care provider. Ask your health care provider what activities are safe for you. °· Take over-the-counter and prescription medicines only as told by your health care provider. °· If you smoke, do not smoke without supervision. °· Keep all follow-up visits as told by your health care provider. This is important. °Contact a health care provider if: °· You continue to have nausea or vomiting at home, and medicines are not helpful. °· You  cannot drink fluids or start eating again. °· You cannot urinate after 8-12 hours. °· You develop a skin rash. °· You have fever. °· You have increasing redness at the site of your procedure. °Get help right away if: °· You have difficulty breathing. °· You have chest pain. °· You have unexpected bleeding. °· You feel that you are having a life-threatening or urgent problem. °This information is not intended to replace advice given to you by your health care provider. Make sure you discuss any questions you have with your health care provider. °Document Released: 06/08/2000 Document Revised: 08/05/2015 Document Reviewed: 02/14/2015 °Elsevier Interactive Patient Education © 2018 [employer] ° °

## 2016-11-04 NOTE — Telephone Encounter (Signed)
Contacted pt and went over his colonoscopy instructions again. Discussed what time to start prep and how to take it. I have also advised him to review the list of acceptable clear liquids from the instruction sheet he received during his office visit with Dr. Allen Norris as he was only drinking water today.

## 2016-11-04 NOTE — Telephone Encounter (Signed)
Patient called back waiting to hear from you. He left a voice message that he needs directions on his prep. I called but no answer and no voice mail. Please call him

## 2016-11-05 ENCOUNTER — Ambulatory Visit
Admission: RE | Admit: 2016-11-05 | Discharge: 2016-11-05 | Disposition: A | Discharge status: Home / Self Care | Payer: Medicare Other | Source: Ambulatory Visit | Attending: Gastroenterology | Admitting: Gastroenterology

## 2016-11-05 ENCOUNTER — Ambulatory Visit: Payer: Medicare Other | Admitting: Anesthesiology

## 2016-11-05 ENCOUNTER — Encounter
Admission: RE | Disposition: A | Discharge status: Home / Self Care | Payer: Self-pay | Source: Ambulatory Visit | Attending: Gastroenterology

## 2016-11-05 DIAGNOSIS — D500 Iron deficiency anemia secondary to blood loss (chronic): Secondary | ICD-10-CM | POA: Diagnosis not present

## 2016-11-05 DIAGNOSIS — K621 Rectal polyp: Secondary | ICD-10-CM

## 2016-11-05 DIAGNOSIS — Z79899 Other long term (current) drug therapy: Secondary | ICD-10-CM | POA: Insufficient documentation

## 2016-11-05 DIAGNOSIS — N182 Chronic kidney disease, stage 2 (mild): Secondary | ICD-10-CM | POA: Insufficient documentation

## 2016-11-05 DIAGNOSIS — E1122 Type 2 diabetes mellitus with diabetic chronic kidney disease: Secondary | ICD-10-CM | POA: Insufficient documentation

## 2016-11-05 DIAGNOSIS — D120 Benign neoplasm of cecum: Secondary | ICD-10-CM | POA: Insufficient documentation

## 2016-11-05 DIAGNOSIS — N400 Enlarged prostate without lower urinary tract symptoms: Secondary | ICD-10-CM | POA: Diagnosis not present

## 2016-11-05 DIAGNOSIS — D122 Benign neoplasm of ascending colon: Secondary | ICD-10-CM | POA: Insufficient documentation

## 2016-11-05 DIAGNOSIS — K2970 Gastritis, unspecified, without bleeding: Secondary | ICD-10-CM | POA: Diagnosis not present

## 2016-11-05 DIAGNOSIS — D125 Benign neoplasm of sigmoid colon: Secondary | ICD-10-CM

## 2016-11-05 DIAGNOSIS — D124 Benign neoplasm of descending colon: Secondary | ICD-10-CM | POA: Diagnosis not present

## 2016-11-05 DIAGNOSIS — E669 Obesity, unspecified: Secondary | ICD-10-CM | POA: Diagnosis not present

## 2016-11-05 DIAGNOSIS — D509 Iron deficiency anemia, unspecified: Secondary | ICD-10-CM | POA: Diagnosis not present

## 2016-11-05 DIAGNOSIS — M109 Gout, unspecified: Secondary | ICD-10-CM | POA: Diagnosis not present

## 2016-11-05 DIAGNOSIS — I2510 Atherosclerotic heart disease of native coronary artery without angina pectoris: Secondary | ICD-10-CM | POA: Diagnosis not present

## 2016-11-05 DIAGNOSIS — I129 Hypertensive chronic kidney disease with stage 1 through stage 4 chronic kidney disease, or unspecified chronic kidney disease: Secondary | ICD-10-CM | POA: Insufficient documentation

## 2016-11-05 DIAGNOSIS — D490 Neoplasm of unspecified behavior of digestive system: Secondary | ICD-10-CM

## 2016-11-05 DIAGNOSIS — K2950 Unspecified chronic gastritis without bleeding: Secondary | ICD-10-CM | POA: Insufficient documentation

## 2016-11-05 DIAGNOSIS — K635 Polyp of colon: Secondary | ICD-10-CM

## 2016-11-05 DIAGNOSIS — Z87891 Personal history of nicotine dependence: Secondary | ICD-10-CM | POA: Insufficient documentation

## 2016-11-05 DIAGNOSIS — D123 Benign neoplasm of transverse colon: Secondary | ICD-10-CM | POA: Insufficient documentation

## 2016-11-05 DIAGNOSIS — Z7984 Long term (current) use of oral hypoglycemic drugs: Secondary | ICD-10-CM | POA: Insufficient documentation

## 2016-11-05 DIAGNOSIS — D128 Benign neoplasm of rectum: Secondary | ICD-10-CM | POA: Diagnosis not present

## 2016-11-05 DIAGNOSIS — E785 Hyperlipidemia, unspecified: Secondary | ICD-10-CM | POA: Insufficient documentation

## 2016-11-05 DIAGNOSIS — K2960 Other gastritis without bleeding: Secondary | ICD-10-CM | POA: Diagnosis not present

## 2016-11-05 HISTORY — PX: COLONOSCOPY WITH PROPOFOL: SHX5780

## 2016-11-05 HISTORY — PX: ESOPHAGOGASTRODUODENOSCOPY (EGD) WITH PROPOFOL: SHX5813

## 2016-11-05 HISTORY — PX: POLYPECTOMY: SHX5525

## 2016-11-05 LAB — GLUCOSE, CAPILLARY
GLUCOSE-CAPILLARY: 149 mg/dL — AB (ref 65–99)
Glucose-Capillary: 157 mg/dL — ABNORMAL HIGH (ref 65–99)

## 2016-11-05 SURGERY — COLONOSCOPY WITH PROPOFOL
Anesthesia: General | Site: Throat | Wound class: Dirty or Infected

## 2016-11-05 MED ORDER — SIMETHICONE 40 MG/0.6ML PO SUSP
ORAL | Status: DC | PRN
  Administered 2016-11-05: 08:00:00

## 2016-11-05 MED ORDER — LACTATED RINGERS IV SOLN
10.0000 mL/h | INTRAVENOUS | Status: DC
  Administered 2016-11-05: 10 mL/h via INTRAVENOUS

## 2016-11-05 MED ORDER — PROMETHAZINE HCL 25 MG/ML IJ SOLN: 6.2500 mg | INTRAMUSCULAR | Status: DC | PRN

## 2016-11-05 MED ORDER — OXYCODONE HCL 5 MG/5ML PO SOLN: 5.0000 mg | Freq: Once | ORAL | Status: DC | PRN

## 2016-11-05 MED ORDER — FENTANYL CITRATE (PF) 100 MCG/2ML IJ SOLN: 25.0000 ug | INTRAMUSCULAR | Status: DC | PRN

## 2016-11-05 MED ORDER — GLYCOPYRROLATE 0.2 MG/ML IJ SOLN
INTRAMUSCULAR | Status: DC | PRN
  Administered 2016-11-05: 0.1 mg via INTRAVENOUS

## 2016-11-05 MED ORDER — LIDOCAINE HCL (CARDIAC) 20 MG/ML IV SOLN
INTRAVENOUS | Status: DC | PRN
  Administered 2016-11-05: 30 mg via INTRAVENOUS

## 2016-11-05 MED ORDER — SODIUM CHLORIDE 0.9 % IV SOLN: INTRAVENOUS | Status: DC

## 2016-11-05 MED ORDER — OXYCODONE HCL 5 MG PO TABS: 5.0000 mg | ORAL_TABLET | Freq: Once | ORAL | Status: DC | PRN

## 2016-11-05 MED ORDER — ONDANSETRON HCL 4 MG/2ML IJ SOLN
INTRAMUSCULAR | Status: DC | PRN
  Administered 2016-11-05: 4 mg via INTRAVENOUS

## 2016-11-05 MED ORDER — MEPERIDINE HCL 25 MG/ML IJ SOLN: 6.2500 mg | INTRAMUSCULAR | Status: DC | PRN

## 2016-11-05 MED ORDER — PROPOFOL 10 MG/ML IV BOLUS
INTRAVENOUS | Status: DC | PRN
  Administered 2016-11-05: 20 mg via INTRAVENOUS
  Administered 2016-11-05: 40 mg via INTRAVENOUS
  Administered 2016-11-05: 30 mg via INTRAVENOUS
  Administered 2016-11-05: 40 mg via INTRAVENOUS
  Administered 2016-11-05: 20 mg via INTRAVENOUS
  Administered 2016-11-05: 10 mg via INTRAVENOUS
  Administered 2016-11-05: 40 mg via INTRAVENOUS
  Administered 2016-11-05 (×3): 30 mg via INTRAVENOUS
  Administered 2016-11-05 (×3): 20 mg via INTRAVENOUS
  Administered 2016-11-05 (×2): 30 mg via INTRAVENOUS
  Administered 2016-11-05: 40 mg via INTRAVENOUS
  Administered 2016-11-05: 20 mg via INTRAVENOUS
  Administered 2016-11-05: 10 mg via INTRAVENOUS
  Administered 2016-11-05: 30 mg via INTRAVENOUS
  Administered 2016-11-05: 100 mg via INTRAVENOUS
  Administered 2016-11-05: 30 mg via INTRAVENOUS
  Administered 2016-11-05: 20 mg via INTRAVENOUS

## 2016-11-05 SURGICAL SUPPLY — 35 items
BALLN DILATOR 10-12 8 (BALLOONS)
BALLN DILATOR 12-15 8 (BALLOONS)
BALLN DILATOR 15-18 8 (BALLOONS)
BALLN DILATOR CRE 0-12 8 (BALLOONS)
BALLN DILATOR ESOPH 8 10 CRE (MISCELLANEOUS) IMPLANT
BALLOON DILATOR 12-15 8 (BALLOONS) IMPLANT
BALLOON DILATOR 15-18 8 (BALLOONS) IMPLANT
BALLOON DILATOR CRE 0-12 8 (BALLOONS) IMPLANT
BLOCK BITE 60FR ADLT L/F GRN (MISCELLANEOUS) ×3 IMPLANT
CANISTER SUCT 1200ML W/VALVE (MISCELLANEOUS) ×3 IMPLANT
CLIP HMST 235XBRD CATH ROT (MISCELLANEOUS) ×2 IMPLANT
CLIP RESOLUTION 360 11X235 (MISCELLANEOUS) ×1
FCP ESCP3.2XJMB 240X2.8X (MISCELLANEOUS)
FORCEPS BIOP RAD 4 LRG CAP 4 (CUTTING FORCEPS) ×3 IMPLANT
FORCEPS BIOP RJ4 240 W/NDL (MISCELLANEOUS)
FORCEPS ESCP3.2XJMB 240X2.8X (MISCELLANEOUS) IMPLANT
GOWN CVR UNV OPN BCK APRN NK (MISCELLANEOUS) ×4 IMPLANT
GOWN ISOL THUMB LOOP REG UNIV (MISCELLANEOUS) ×2
INJECTOR VARIJECT VIN23 (MISCELLANEOUS) ×2 IMPLANT
KIT DEFENDO VALVE AND CONN (KITS) ×6 IMPLANT
KIT ENDO PROCEDURE OLY (KITS) ×3 IMPLANT
MARKER SPOT ENDO TATTOO 5ML (MISCELLANEOUS) ×2 IMPLANT
PAD GROUND ADULT SPLIT (MISCELLANEOUS) ×3 IMPLANT
PROBE APC STR FIRE (PROBE) IMPLANT
RETRIEVER NET PLAT FOOD (MISCELLANEOUS) IMPLANT
RETRIEVER NET ROTH 2.5X230 LF (MISCELLANEOUS) IMPLANT
SNARE SHORT THROW 13M SML OVAL (MISCELLANEOUS) ×3 IMPLANT
SNARE SHORT THROW 30M LRG OVAL (MISCELLANEOUS) IMPLANT
SNARE SNG USE RND 15MM (INSTRUMENTS) IMPLANT
SPOT EX ENDOSCOPIC TATTOO (MISCELLANEOUS) ×1
SYR INFLATION 60ML (SYRINGE) IMPLANT
TRAP ETRAP POLY (MISCELLANEOUS) ×3 IMPLANT
VARIJECT INJECTOR VIN23 (MISCELLANEOUS) ×3
WATER STERILE IRR 250ML POUR (IV SOLUTION) ×6 IMPLANT
WIRE CRE 18-20MM 8CM F G (MISCELLANEOUS) IMPLANT

## 2016-11-05 NOTE — Anesthesia Postprocedure Evaluation (Signed)
Anesthesia Post Note  Patient: Junah Yam.  Procedure(s) Performed: Procedure(s) (LRB): COLONOSCOPY WITH PROPOFOL (N/A) ESOPHAGOGASTRODUODENOSCOPY (EGD) WITH PROPOFOL (N/A) POLYPECTOMY (N/A)  Patient location during evaluation: PACU Anesthesia Type: General Level of consciousness: awake and alert, oriented and patient cooperative Pain management: pain level controlled Vital Signs Assessment: post-procedure vital signs reviewed and stable Respiratory status: spontaneous breathing, nonlabored ventilation and respiratory function stable Cardiovascular status: blood pressure returned to baseline and stable Postop Assessment: adequate PO intake Anesthetic complications: no    Darrin Nipper

## 2016-11-05 NOTE — Op Note (Signed)
[facility] Gastroenterology Patient Name: Donald Mcdowell Procedure Date: 11/05/2016 7:29 AM MRN: [id number] Account #: [id number] Date of Birth: [birth year] Admit Type: Outpatient Age: 71 Room: [facility] OR ROOM 01 Gender: Male Note Status: Finalized Procedure:            Upper GI endoscopy Indications:          Iron deficiency anemia Providers:            Lucilla Lame MD, MD Referring MD:         Arnetha Courser (Referring MD) Medicines:            Propofol per Anesthesia Complications:        No immediate complications. Procedure:            Pre-Anesthesia Assessment:                       - Prior to the procedure, a History and Physical was                        performed, and patient medications and allergies were                        reviewed. The patient's tolerance of previous                        anesthesia was also reviewed. The risks and benefits of                        the procedure and the sedation options and risks were                        discussed with the patient. All questions were                        answered, and informed consent was obtained. Prior                        Anticoagulants: The patient has taken no previous                        anticoagulant or antiplatelet agents. ASA Grade                        Assessment: II - A patient with mild systemic disease.                        After reviewing the risks and benefits, the patient was                        deemed in satisfactory condition to undergo the                        procedure.                       After obtaining informed consent, the endoscope was                        passed under direct vision. Throughout the procedure,  the patient's blood pressure, pulse, and oxygen                        saturations were monitored continuously. The Olympus                        [id number]-[id number] Endoscope (S#. [id number]) was introduced   through the mouth, and advanced to the second part of                        duodenum. The upper GI endoscopy was accomplished                        without difficulty. The patient tolerated the procedure                        well. Findings:      The examined esophagus was normal.      Localized mild inflammation characterized by erythema was found in the       gastric antrum. Biopsies were taken with a cold forceps for histology.      The examined duodenum was normal. Impression:           - Normal esophagus.                       - Gastritis. Biopsied.                       - Normal examined duodenum. Recommendation:       - Await pathology results.                       - Discharge patient to home.                       - Resume previous diet.                       - Continue present medications.                       - Perform a colonoscopy. Procedure Code(s):    --- Professional ---                       [phone], Esophagogastroduodenoscopy, flexible, transoral;                        with biopsy, single or multiple Diagnosis Code(s):    --- Professional ---                       D50.9, Iron deficiency anemia, unspecified                       K29.70, Gastritis, unspecified, without bleeding CPT copyright 2016 American Medical Association. All rights reserved. The codes documented in this report are preliminary and upon coder review may  be revised to meet current compliance requirements. Lucilla Lame MD, MD 11/05/2016 8:00:17 AM This report has been signed electronically. Number of Addenda: 0 Note Initiated On: 11/05/2016 7:29 AM      [facility]

## 2016-11-05 NOTE — Op Note (Signed)
[facility] Patient Name: Akshar Starnes Procedure Date: 11/05/2016 7:29 AM MRN: [id number] Account #: [id number] Date of Birth: [birth year] Admit Type: Outpatient Age: 71 Room: [facility] OR ROOM 01 Gender: Male Note Status: Finalized Procedure:            Colonoscopy Indications:          Iron deficiency anemia Providers:            Lucilla Lame MD, MD Referring MD:         Arnetha Courser (Referring MD) Medicines:            Propofol per Anesthesia Complications:        No immediate complications. Procedure:            Pre-Anesthesia Assessment:                       - Prior to the procedure, a History and Physical was                        performed, and patient medications and allergies were                        reviewed. The patient's tolerance of previous                        anesthesia was also reviewed. The risks and benefits of                        the procedure and the sedation options and risks were                        discussed with the patient. All questions were                        answered, and informed consent was obtained. Prior                        Anticoagulants: The patient has taken no previous                        anticoagulant or antiplatelet agents. ASA Grade                        Assessment: II - A patient with mild systemic disease.                        After reviewing the risks and benefits, the patient was                        deemed in satisfactory condition to undergo the                        procedure.                       After obtaining informed consent, the colonoscope was                        passed under direct vision. Throughout the procedure,  the patient's blood pressure, pulse, and oxygen                        saturations were monitored continuously. The Olympus                        Colonoscope 190 [id number]) was introduced through the   anus and advanced to the the cecum, identified by                        appendiceal orifice and ileocecal valve. The                        colonoscopy was performed without difficulty. The                        patient tolerated the procedure well. The quality of                        the bowel preparation was good. Findings:      The perianal and digital rectal examinations were normal.      A 6 mm polyp was found in the cecum. The polyp was sessile. The polyp       was removed with a cold snare. Resection and retrieval were complete.      Three semi-pedunculated polyps were found in the ascending colon. The       polyps were 6 to 10 mm in size. These polyps were removed with a hot       snare. Resection and retrieval were complete.      A 6 mm polyp was found in the transverse colon. The polyp was sessile.       The polyp was removed with a cold snare. Resection and retrieval were       complete.      A partially obstructing large mass was found in the transverse colon.       The mass was non-circumferential. No bleeding was present. Biopsies were       taken with a cold forceps for histology. Area was successfully injected       with 5 mL Niger ink for tattooing.      Two sessile polyps were found in the descending colon. The polyps were 5       to 9 mm in size. These polyps were removed with a hot snare. Resection       and retrieval were complete.      Three sessile polyps were found in the sigmoid colon. The polyps were 6       to 15 mm in size. These polyps were removed with a hot snare. Resection       and retrieval were complete. To prevent bleeding post-intervention, one       hemostatic clip was successfully placed (MR conditional). There was no       bleeding at the end of the procedure.      A 9 mm polyp was found in the rectum. The polyp was sessile. The polyp       was removed with a hot snare. Resection and retrieval were complete. Impression:           - One 6 mm  polyp in the cecum, removed with a cold  snare. Resected and retrieved.                       - Three 6 to 10 mm polyps in the ascending colon,                        removed with a hot snare. Resected and retrieved.                       - One 6 mm polyp in the transverse colon, removed with                        a cold snare. Resected and retrieved.                       - Likely malignant partially obstructing tumor in the                        transverse colon. Biopsied. Injected.                       - Two 5 to 9 mm polyps in the descending colon, removed                        with a hot snare. Resected and retrieved.                       - Three 6 to 15 mm polyps in the sigmoid colon, removed                        with a hot snare. Resected and retrieved. Clip (MR                        conditional) was placed.                       - One 9 mm polyp in the rectum, removed with a hot                        snare. Resected and retrieved. Recommendation:       - Discharge patient to home.                       - Resume previous diet.                       - Continue present medications.                       - Await pathology results.                       - Refer to a Psychologist, sport and exercise. Procedure Code(s):    --- Professional ---                       [phone], Colonoscopy, flexible; with removal of tumor(s),                        polyp(s), or other lesion(s) by snare technique  [id number], Colonoscopy, flexible; with directed submucosal                        injection(s), any substance                       18299, 22, Colonoscopy, flexible; with biopsy, single                        or multiple Diagnosis Code(s):    --- Professional ---                       D50.9, Iron deficiency anemia, unspecified                       D49.0, Neoplasm of unspecified behavior of digestive                        system                       D12.5, Benign neoplasm  of sigmoid colon                       D12.4, Benign neoplasm of descending colon                       K62.1, Rectal polyp                       D12.2, Benign neoplasm of ascending colon                       D12.3, Benign neoplasm of transverse colon (hepatic                        flexure or splenic flexure)                       D12.0, Benign neoplasm of cecum CPT copyright 2016 American Medical Association. All rights reserved. The codes documented in this report are preliminary and upon coder review may  be revised to meet current compliance requirements. Lucilla Lame MD, MD 11/05/2016 8:50:55 AM This report has been signed electronically. Number of Addenda: 0 Note Initiated On: 11/05/2016 7:29 AM Scope Withdrawal Time: 0 hours 33 minutes 48 seconds  Total Procedure Duration: 0 hours 42 minutes 22 seconds       [facility]

## 2016-11-05 NOTE — Anesthesia Preprocedure Evaluation (Signed)
Anesthesia Evaluation  Patient identified by MRN, date of birth, ID band Patient awake    Reviewed: Allergy & Precautions, NPO status , Patient's Chart, lab work & pertinent test results  History of Anesthesia Complications Negative for: history of anesthetic complications  Airway Mallampati: IV  TM Distance: >3 FB Neck ROM: Full    Dental no notable dental hx.    Pulmonary former smoker (quit 06/2016),    Pulmonary exam normal breath sounds clear to auscultation       Cardiovascular Exercise Tolerance: Good hypertension, Normal cardiovascular exam Rhythm:Regular Rate:Normal     Neuro/Psych negative neurological ROS     GI/Hepatic negative GI ROS,   Endo/Other  diabetes, Type 2Obesity   Renal/GU Renal disease (nephrolithiasis; stage II CKD)     Musculoskeletal Gout    Abdominal   Peds  Hematology  (+) Blood dyscrasia, anemia ,   Anesthesia Other Findings BPH  Reproductive/Obstetrics                             Anesthesia Physical Anesthesia Plan  ASA: III  Anesthesia Plan: General   Post-op Pain Management:    Induction: Intravenous  PONV Risk Score and Plan: 1 and Ondansetron and Propofol infusion  Airway Management Planned: Natural Airway  Additional Equipment:   Intra-op Plan:   Post-operative Plan:   Informed Consent: I have reviewed the patients History and Physical, chart, labs and discussed the procedure including the risks, benefits and alternatives for the proposed anesthesia with the patient or authorized representative who has indicated his/her understanding and acceptance.     Plan Discussed with: CRNA  Anesthesia Plan Comments:         Anesthesia Quick Evaluation

## 2016-11-05 NOTE — H&P (Signed)
Lucilla Lame, MD [address] [place], [place] [address] Phone:[phone] Fax : [phone]  Primary Care Physician:  Arnetha Courser, MD Primary Gastroenterologist:  Dr. Allen Norris  Pre-Procedure History & Physical: HPI:  Donald Mcdowell. is a 71 y.o. male is here for an endoscopy and colonoscopy.   Past Medical History:  Diagnosis Date  . Abdominal aortic atherosclerosis ([place]) 09/15/2016   CT scan June 2018  . Allergy   . Coronary artery calcification seen on CAT scan 09/15/2016   Previous smoker; noted on CT scan June 2018  . Diabetes mellitus without complication ([place])   . Hyperlipidemia   . Hypertension   . Kidney stones   . Obesity 08/20/2015    Past Surgical History:  Procedure Laterality Date  . kidney stones      Prior to Admission medications   Medication Sig Start Date End Date Taking? Authorizing Provider  allopurinol (ZYLOPRIM) 300 MG tablet Take 1 tablet (300 mg total) by mouth daily. 09/15/16  Yes Lada, Satira Anis, MD  glipiZIDE (GLUCOTROL XL) 10 MG 24 hr tablet Take 1 tablet (10 mg total) by mouth 2 (two) times daily. 09/15/16  Yes Lada, Satira Anis, MD  Iron-Vitamin C (IRON 100/C PO) Take 65 mg by mouth daily.   Yes [provider]  metFORMIN (GLUCOPHAGE) 1000 MG tablet Take 0.5 tablets (500 mg total) by mouth 2 (two) times daily. 11/02/16  Yes Lada, Satira Anis, MD  Omega-3 Fatty Acids (FISH OIL) 1200 MG CAPS Take by mouth 2 (two) times daily.   Yes [provider]  quinapril (ACCUPRIL) 5 MG tablet Take 1 tablet (5 mg total) by mouth daily. 08/28/16  Yes Lada, Satira Anis, MD  rosuvastatin (CRESTOR) 20 MG tablet Take 0.5 tablets (10 mg total) by mouth daily. 08/31/16  Yes Lada, Satira Anis, MD  tamsulosin (FLOMAX) 0.4 MG CAPS capsule Take 0.4 mg by mouth daily. 10/02/14  Yes [provider]  vitamin B-12 (CYANOCOBALAMIN) 100 MCG tablet Take 100 mcg by mouth daily.   Yes [provider]  glucose blood (ACCU-CHEK COMPACT STRIPS)  test strip 100 each by Other route as needed for other. Use as instructed, check FSBS once a day; LON 99 months, E11.9 03/19/16   Lada, Satira Anis, MD  polyethylene glycol-electrolytes (GAVILYTE-N WITH FLAVOR PACK) 420 g solution Drink one 8 oz glass every 20 mins until stools are clear starting at 5:00pm on 11/04/16. 10/30/16   Lucilla Lame, MD    Allergies as of 10/27/2016  . (No Known Allergies)    Family History  Problem Relation Age of Onset  . Alzheimer's disease Mother   . Heart disease Father   . Diabetes Father   . Cancer Sister        skin cancer    Social History   Social History  . Marital status: Married    Spouse name: N/A  . Number of children: N/A  . Years of education: N/A   Occupational History  . Not on file.   Social History Main Topics  . Smoking status: Former Smoker    Types: Cigarettes    Quit date: 06/2016  . Smokeless tobacco: Never Used  . Alcohol use No  . Drug use: No  . Sexual activity: Not Currently   Other Topics Concern  . Not on file   Social History Narrative  . No narrative on file    Review of Systems: See HPI, otherwise negative ROS  Physical Exam: BP Marland Kitchen)  158/74   Pulse 74   Temp (!) 97.2 F (36.2 C) (Temporal)   Resp 16   Ht 5\' 11"  (1.803 m)   Wt 273 lb (123.8 kg)   SpO2 100%   BMI 38.08 kg/m  General:   Alert,  pleasant and cooperative in NAD Head:  Normocephalic and atraumatic. Neck:  Supple; no masses or thyromegaly. Lungs:  Clear throughout to auscultation.    Heart:  Regular rate and rhythm. Abdomen:  Soft, nontender and nondistended. Normal bowel sounds, without guarding, and without rebound.   Neurologic:  Alert and  oriented x4;  grossly normal neurologically.  Impression/Plan: [name] is here for an endoscopy and colonoscopy to be performed for IDA with rectal bleeding.  Risks, benefits, limitations, and alternatives regarding  endoscopy and colonoscopy have been reviewed with the patient.   Questions have been answered.  All parties agreeable.   Lucilla Lame, MD  11/05/2016, 7:38 AM

## 2016-11-05 NOTE — Anesthesia Procedure Notes (Signed)
Date/Time: 11/05/2016 7:51 AM Performed by: Cameron Ali Pre-anesthesia Checklist: Patient identified, Emergency Drugs available, Suction available, Timeout performed and Patient being monitored Patient Re-evaluated:Patient Re-evaluated prior to induction Oxygen Delivery Method: Nasal cannula Placement Confirmation: positive ETCO2

## 2016-11-05 NOTE — Transfer of Care (Signed)
Immediate Anesthesia Transfer of Care Note  Patient: Donald Mcdowell.  Procedure(s) Performed: Procedure(s) with comments: COLONOSCOPY WITH PROPOFOL (N/A) ESOPHAGOGASTRODUODENOSCOPY (EGD) WITH PROPOFOL (N/A) - Diabetic - oral meds POLYPECTOMY (N/A)  Patient Location: PACU  Anesthesia Type: General  Level of Consciousness: awake, alert  and patient cooperative  Airway and Oxygen Therapy: Patient Spontanous Breathing and Patient connected to supplemental oxygen  Post-op Assessment: Post-op Vital signs reviewed, Patient's Cardiovascular Status Stable, Respiratory Function Stable, Patent Airway and No signs of Nausea or vomiting  Post-op Vital Signs: Reviewed and stable  Complications: No apparent anesthesia complications

## 2016-11-06 ENCOUNTER — Encounter: Payer: Self-pay | Admitting: Gastroenterology

## 2016-11-09 ENCOUNTER — Encounter: Payer: Self-pay | Admitting: Surgery

## 2016-11-09 ENCOUNTER — Ambulatory Visit (INDEPENDENT_AMBULATORY_CARE_PROVIDER_SITE_OTHER): Payer: Medicare Other | Admitting: Surgery

## 2016-11-09 ENCOUNTER — Other Ambulatory Visit
Admission: RE | Admit: 2016-11-09 | Discharge: 2016-11-09 | Disposition: A | Discharge status: Home / Self Care | Payer: Medicare Other | Source: Ambulatory Visit | Attending: Surgery | Admitting: Surgery

## 2016-11-09 ENCOUNTER — Ambulatory Visit
Admission: RE | Admit: 2016-11-09 | Discharge: 2016-11-09 | Disposition: A | Discharge status: Home / Self Care | Payer: Medicare Other | Source: Ambulatory Visit | Attending: Surgery | Admitting: Surgery

## 2016-11-09 ENCOUNTER — Other Ambulatory Visit: Payer: Self-pay | Admitting: Pathology

## 2016-11-09 ENCOUNTER — Telehealth: Payer: Self-pay

## 2016-11-09 VITALS — BP 129/64 | HR 89 | Temp 98.1°F | Ht 71.0 in | Wt 278.6 lb

## 2016-11-09 DIAGNOSIS — K6389 Other specified diseases of intestine: Secondary | ICD-10-CM

## 2016-11-09 DIAGNOSIS — I700 Atherosclerosis of aorta: Secondary | ICD-10-CM | POA: Insufficient documentation

## 2016-11-09 DIAGNOSIS — I2584 Coronary atherosclerosis due to calcified coronary lesion: Secondary | ICD-10-CM | POA: Insufficient documentation

## 2016-11-09 DIAGNOSIS — N400 Enlarged prostate without lower urinary tract symptoms: Secondary | ICD-10-CM | POA: Diagnosis not present

## 2016-11-09 DIAGNOSIS — N200 Calculus of kidney: Secondary | ICD-10-CM | POA: Diagnosis not present

## 2016-11-09 DIAGNOSIS — N281 Cyst of kidney, acquired: Secondary | ICD-10-CM | POA: Diagnosis not present

## 2016-11-09 DIAGNOSIS — Z0189 Encounter for other specified special examinations: Secondary | ICD-10-CM | POA: Insufficient documentation

## 2016-11-09 DIAGNOSIS — R590 Localized enlarged lymph nodes: Secondary | ICD-10-CM | POA: Insufficient documentation

## 2016-11-09 DIAGNOSIS — C189 Malignant neoplasm of colon, unspecified: Secondary | ICD-10-CM | POA: Diagnosis not present

## 2016-11-09 DIAGNOSIS — K639 Disease of intestine, unspecified: Secondary | ICD-10-CM | POA: Insufficient documentation

## 2016-11-09 DIAGNOSIS — I2510 Atherosclerotic heart disease of native coronary artery without angina pectoris: Secondary | ICD-10-CM | POA: Diagnosis not present

## 2016-11-09 HISTORY — DX: Malignant (primary) neoplasm, unspecified: C80.1

## 2016-11-09 LAB — CBC WITH DIFFERENTIAL/PLATELET
BASOS ABS: 0 10*3/uL (ref 0–0.1)
BASOS PCT: 1 %
EOS ABS: 0.1 10*3/uL (ref 0–0.7)
EOS PCT: 2 %
HCT: 28.1 % — ABNORMAL LOW (ref 40.0–52.0)
Hemoglobin: 9.3 g/dL — ABNORMAL LOW (ref 13.0–18.0)
LYMPHS PCT: 33 %
Lymphs Abs: 2 10*3/uL (ref 1.0–3.6)
MCH: 29.1 pg (ref 26.0–34.0)
MCHC: 33.1 g/dL (ref 32.0–36.0)
MCV: 87.9 fL (ref 80.0–100.0)
MONO ABS: 0.4 10*3/uL (ref 0.2–1.0)
Monocytes Relative: 7 %
Neutro Abs: 3.6 10*3/uL (ref 1.4–6.5)
Neutrophils Relative %: 57 %
PLATELETS: 272 10*3/uL (ref 150–440)
RBC: 3.19 MIL/uL — AB (ref 4.40–5.90)
RDW: 18.8 % — AB (ref 11.5–14.5)
WBC: 6.2 10*3/uL (ref 3.8–10.6)

## 2016-11-09 LAB — SURGICAL PATHOLOGY

## 2016-11-09 LAB — COMPREHENSIVE METABOLIC PANEL
ALBUMIN: 3.8 g/dL (ref 3.5–5.0)
ALK PHOS: 44 U/L (ref 38–126)
ALT: 26 U/L (ref 17–63)
AST: 23 U/L (ref 15–41)
Anion gap: 11 (ref 5–15)
BILIRUBIN TOTAL: 0.4 mg/dL (ref 0.3–1.2)
BUN: 27 mg/dL — AB (ref 6–20)
CALCIUM: 9.9 mg/dL (ref 8.9–10.3)
CO2: 25 mmol/L (ref 22–32)
CREATININE: 1.25 mg/dL — AB (ref 0.61–1.24)
Chloride: 104 mmol/L (ref 101–111)
GFR calc Af Amer: >60 mL/min (ref >60)
GFR, EST NON AFRICAN AMERICAN: 57 mL/min — AB (ref >60)
GLUCOSE: 147 mg/dL — AB (ref 65–99)
Potassium: 4.1 mmol/L (ref 3.5–5.1)
Sodium: 140 mmol/L (ref 135–145)
TOTAL PROTEIN: 6.9 g/dL (ref 6.5–8.1)

## 2016-11-09 MED ORDER — IOPAMIDOL (ISOVUE-300) INJECTION 61%
125.0000 mL | Freq: Once | INTRAVENOUS | Status: AC | PRN
  Administered 2016-11-09: 14:00:00 via INTRAVENOUS

## 2016-11-09 NOTE — Progress Notes (Signed)
Surgical Consultation  11/09/2016  Donald Mcdowell. is an 71 y.o. male.   Chief Complaint  Patient presents with  . New Patient (Initial Visit)    colon mass referral from [facility]      HPI: Donald Mcdowell is a 71 year old male seen in consultation at the request of Dr.Wohl for an unresectable transverse colon mass. Patient has been workup for chronic anemia and some hematochezia and has been present for the last few months. Patient reports that the recent colonoscopy has been the only one is ever had. He has been treating for symptomatic anemia but has not required any transfusions. colonoscopy performed last Thursday, I have personally reviewed the images showing evidence of circumferential mass that is nonobstructing located in the transverse colon. Multiple polyps in the right side and also in the sigmoid colon. Pathology s pending. He does have a history of diabetes and chronic renal insufficiency with a baseline creatine of 1.2. He is able to walk and perform more than 4 Mets of activity without any shortness of breath or chest pain. He walks without any assistance. No previous operations. No known family history of colorectal CA HE did have a CT scan 2 months ago that I reviewed showing no evidence of obstructing lesions. Granted this CT scan was performed without by mouth contrast. HE is reluctant to have surgical intervention at this time  Past Medical History:  Diagnosis Date  . Abdominal aortic atherosclerosis ([place]) 09/15/2016   CT scan June 2018  . Allergy   . Coronary artery calcification seen on CAT scan 09/15/2016   Previous smoker; noted on CT scan June 2018  . Diabetes mellitus without complication ([place])   . Hyperlipidemia   . Hypertension   . Kidney stones   . Obesity 08/20/2015    Past Surgical History:  Procedure Laterality Date  . COLONOSCOPY WITH PROPOFOL N/A 11/05/2016   Procedure: COLONOSCOPY WITH PROPOFOL;  Surgeon: Lucilla Lame, MD;   Location: [facility];  Service: Gastroenterology;  Laterality: N/A;  . ESOPHAGOGASTRODUODENOSCOPY (EGD) WITH PROPOFOL N/A 11/05/2016   Procedure: ESOPHAGOGASTRODUODENOSCOPY (EGD) WITH PROPOFOL;  Surgeon: Lucilla Lame, MD;  Location: [facility];  Service: Gastroenterology;  Laterality: N/A;  Diabetic - oral meds  . kidney stones    . POLYPECTOMY N/A 11/05/2016   Procedure: POLYPECTOMY;  Surgeon: Lucilla Lame, MD;  Location: [facility];  Service: Gastroenterology;  Laterality: N/A;    Family History  Problem Relation Age of Onset  . Alzheimer's disease Mother   . Heart disease Father   . Diabetes Father   . Cancer Father        Prostate  . Cancer Sister        skin cancer    Social History:  reports that he quit smoking about 4 months ago. His smoking use included Cigarettes. He has never used smokeless tobacco. He reports that he does not drink alcohol or use drugs.  Allergies: No Known Allergies  Medications reviewed.     ROS Full ROS performed and is otherwise negative other than what is stated in the HPI    BP 129/64   Pulse 89   Temp 98.1 F (36.7 C) (Oral)   Ht 5\' 11"  (1.803 m)   Wt 126.4 kg (278 lb 9.6 oz)   BMI 38.86 kg/m    Physical Exam  Constitutional: He is oriented to person, place, and time and well-developed, well-nourished, and in no distress. No distress.  Obese male  HENT:  Head: Normocephalic and atraumatic.  Eyes: Right eye exhibits no discharge. Left eye exhibits no discharge. No scleral icterus.  Neck: Normal range of motion. No JVD present. No tracheal deviation present.  Cardiovascular: Normal rate and intact distal pulses.   Murmur heard. Pulmonary/Chest: Effort normal and breath sounds normal. No stridor. No respiratory distress. He has no wheezes. He has no rales. He exhibits no tenderness.  Abdominal: Soft. He exhibits no distension. There is no tenderness. There is no rebound and no guarding.  Musculoskeletal:  Normal range of motion. He exhibits edema. He exhibits no tenderness.  Neurological: He is alert and oriented to person, place, and time. Gait normal. GCS score is 15.  Skin: Skin is warm and dry. He is not diaphoretic.  Psychiatric: Mood, memory, affect and judgment normal.  Nursing note and vitals reviewed.    Assessment/Plan: 1. Mass of colon I have an extensive discussion with the patient and the family about colonoscopic findings. We have a high suspicious of malignancy. I did explain to him the best therapy will be a colon resection. Scars with the patient in detail about the procedure. Risk, benefits and possible complication ncluding but not limited to: Bleeding, infection, leak, possible re-interventions. Currently he is very hesitant about having any procedures performed at this time. His final pathology still pending. I discussed withat I will order a CEA, repeat labs as well as CT scan with by mouth contrast to rule out any metastatic disease. I will see him again in 3 days to further discuss his pathology and imaging findings. I spent at least 60 minutes in this encounter with the majority of time spent in coordination of care  and counseling   Caroleen Hamman, MD [place] Surgeon

## 2016-11-09 NOTE — Patient Instructions (Addendum)
We have scheduled you for a CT Scan of your Abdomen and Pelvis. This has been scheduled at 8/27 at our [facility] [place] location. Please Check-in at Registration Desk, 15 minutes prior to your scheduled appointment. If you need to reschedule your Scan, you may do so by calling [phone].  You will need to pick up a prep kit at least 24 hours in advance of your Scan: You may pick this up at the [place] at [facility].  Bring a list of medications with you to your appointment and you may have nothing to eat or drink 4 hours prior to your CT Scan.    We have also scheduled for you to have some labs done prior to this appointment as well. You may walk over to the [facility] to have these labs done today. Walking directions are listed below:  Directions to Medical Mall: When leaving our office, go right. Go all of the way down to the very end of the hallway. You will have a purple wall in front of you. You will now have a tunnel to the hospital on your left hand side. Go through this tunnel and the elevators will be on your left. Go down to the 1st floor and take a slight left. The very first desk on the right hand side is the registration desk.   We will follow up with you with these results as listed below:   If you have any questions or concerns prior to this appointment please give our office a call.

## 2016-11-09 NOTE — Telephone Encounter (Signed)
Donald Mcdowell called with CT Scan results from patient. She asked if I had access to Epic. I told her I did and she assured me that the results were in his chart.

## 2016-11-10 ENCOUNTER — Ambulatory Visit: Payer: Medicare Other | Admitting: Surgery

## 2016-11-10 LAB — CEA: CEA1: 1.4 ng/mL (ref 0.0–4.7)

## 2016-11-12 ENCOUNTER — Ambulatory Visit: Payer: Medicare Other

## 2016-11-12 ENCOUNTER — Ambulatory Visit (INDEPENDENT_AMBULATORY_CARE_PROVIDER_SITE_OTHER): Payer: Medicare Other | Admitting: Surgery

## 2016-11-12 ENCOUNTER — Encounter: Payer: Self-pay | Admitting: Surgery

## 2016-11-12 VITALS — BP 144/70 | HR 80 | Temp 97.6°F | Ht 71.0 in | Wt 283.8 lb

## 2016-11-12 DIAGNOSIS — K639 Disease of intestine, unspecified: Secondary | ICD-10-CM

## 2016-11-12 DIAGNOSIS — K6389 Other specified diseases of intestine: Secondary | ICD-10-CM

## 2016-11-12 NOTE — Patient Instructions (Signed)
Please call our office if you have questions or concerns.   

## 2016-11-12 NOTE — Progress Notes (Signed)
Platelets 71 year old well known to me. Had a recent colonoscopy and there were 11 polyps and a circumferential mass within the transverse colon. Pathology has been reviewed and there is an adenocarcinoma from the circumferential mass on the transverse colon and there is also some high-grade dysplasia within 2 polyps that were located within the sigmoid colon. His  hemoglobin is 9 and his creatinine is 1.2. I have personally reviewed his CT scan of the abdomen and pelvis showing evidence of the colon cancer within the transverse colon. There is also a lymph node within the mesentery of the colon. No evidence of distant metastasis.  I have personally reviewed the pathology finding as well as the CT findings with the patient and the family. Discussed with them in detail about options. 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 Thomas surgical perspective the minimal operation will be an extended left hemicolectomy to include a sigmoid colectomy. More ideally given the fact that his get synchronous colon cancers with significant polyps that are large in size probably best oncological option would be a total colectomy. From a surgical perspective all these procedures were required a major surgical intervention. After lengthy discussion with the patient he decided not to pursue any surgical intervention at this time nor any type of oncological treatment. Discussed with the patient in detail about the side effects and potential complications of both her chemotherapy the surgery and also not doing anything. He is very comfortable with no medical or surgical management at this time. I also offered him to seek a second opinion and have oncology evaluation. At this time he is not interested in any of those options.  Please note that I spent at least 50 minutes in this encounter with greater than 50% of the time spent counseling the patient and the family.

## 2016-11-13 ENCOUNTER — Other Ambulatory Visit: Payer: Self-pay | Admitting: Family Medicine

## 2016-11-13 MED ORDER — METFORMIN HCL 1000 MG PO TABS: 500.0000 mg | ORAL_TABLET | Freq: Two times a day (BID) | ORAL | 0 refills | Status: DC

## 2016-11-18 ENCOUNTER — Encounter: Payer: Self-pay | Admitting: Hematology and Oncology

## 2016-11-18 ENCOUNTER — Telehealth: Payer: Self-pay | Admitting: Urgent Care

## 2016-11-18 ENCOUNTER — Inpatient Hospital Stay: Payer: Medicare Other

## 2016-11-18 ENCOUNTER — Ambulatory Visit
Admission: RE | Admit: 2016-11-18 | Discharge: 2016-11-18 | Disposition: A | Discharge status: Home / Self Care | Payer: Medicare Other | Source: Ambulatory Visit | Attending: Urgent Care | Admitting: Urgent Care

## 2016-11-18 ENCOUNTER — Inpatient Hospital Stay: Payer: Medicare Other | Attending: Hematology and Oncology | Admitting: Hematology and Oncology

## 2016-11-18 VITALS — BP 130/68 | HR 78 | Temp 98.0°F | Resp 20 | Ht 73.0 in | Wt 279.1 lb

## 2016-11-18 DIAGNOSIS — E669 Obesity, unspecified: Secondary | ICD-10-CM | POA: Insufficient documentation

## 2016-11-18 DIAGNOSIS — C189 Malignant neoplasm of colon, unspecified: Secondary | ICD-10-CM | POA: Diagnosis present

## 2016-11-18 DIAGNOSIS — Z87442 Personal history of urinary calculi: Secondary | ICD-10-CM | POA: Diagnosis not present

## 2016-11-18 DIAGNOSIS — K2970 Gastritis, unspecified, without bleeding: Secondary | ICD-10-CM | POA: Insufficient documentation

## 2016-11-18 DIAGNOSIS — E785 Hyperlipidemia, unspecified: Secondary | ICD-10-CM | POA: Insufficient documentation

## 2016-11-18 DIAGNOSIS — F17210 Nicotine dependence, cigarettes, uncomplicated: Secondary | ICD-10-CM | POA: Diagnosis not present

## 2016-11-18 DIAGNOSIS — Z79899 Other long term (current) drug therapy: Secondary | ICD-10-CM | POA: Insufficient documentation

## 2016-11-18 DIAGNOSIS — D126 Benign neoplasm of colon, unspecified: Secondary | ICD-10-CM

## 2016-11-18 DIAGNOSIS — I2510 Atherosclerotic heart disease of native coronary artery without angina pectoris: Secondary | ICD-10-CM | POA: Insufficient documentation

## 2016-11-18 DIAGNOSIS — D125 Benign neoplasm of sigmoid colon: Secondary | ICD-10-CM

## 2016-11-18 DIAGNOSIS — E040 Nontoxic diffuse goiter: Secondary | ICD-10-CM | POA: Diagnosis not present

## 2016-11-18 DIAGNOSIS — K921 Melena: Secondary | ICD-10-CM

## 2016-11-18 DIAGNOSIS — D509 Iron deficiency anemia, unspecified: Secondary | ICD-10-CM | POA: Insufficient documentation

## 2016-11-18 DIAGNOSIS — I700 Atherosclerosis of aorta: Secondary | ICD-10-CM | POA: Insufficient documentation

## 2016-11-18 DIAGNOSIS — K5900 Constipation, unspecified: Secondary | ICD-10-CM | POA: Diagnosis not present

## 2016-11-18 DIAGNOSIS — K621 Rectal polyp: Secondary | ICD-10-CM | POA: Insufficient documentation

## 2016-11-18 DIAGNOSIS — R911 Solitary pulmonary nodule: Secondary | ICD-10-CM | POA: Diagnosis not present

## 2016-11-18 DIAGNOSIS — Q619 Cystic kidney disease, unspecified: Secondary | ICD-10-CM | POA: Insufficient documentation

## 2016-11-18 DIAGNOSIS — N400 Enlarged prostate without lower urinary tract symptoms: Secondary | ICD-10-CM | POA: Insufficient documentation

## 2016-11-18 DIAGNOSIS — C184 Malignant neoplasm of transverse colon: Secondary | ICD-10-CM | POA: Insufficient documentation

## 2016-11-18 DIAGNOSIS — I10 Essential (primary) hypertension: Secondary | ICD-10-CM | POA: Insufficient documentation

## 2016-11-18 DIAGNOSIS — D3501 Benign neoplasm of right adrenal gland: Secondary | ICD-10-CM | POA: Diagnosis not present

## 2016-11-18 LAB — CBC WITH DIFFERENTIAL/PLATELET
BASOS ABS: 0.1 10*3/uL (ref 0–0.1)
Basophils Relative: 1 %
EOS ABS: 0.2 10*3/uL (ref 0–0.7)
Eosinophils Relative: 3 %
HCT: 29.3 % — ABNORMAL LOW (ref 40.0–52.0)
Hemoglobin: 9.6 g/dL — ABNORMAL LOW (ref 13.0–18.0)
LYMPHS PCT: 33 %
Lymphs Abs: 2.5 10*3/uL (ref 1.0–3.6)
MCH: 29.8 pg (ref 26.0–34.0)
MCHC: 32.7 g/dL (ref 32.0–36.0)
MCV: 91 fL (ref 80.0–100.0)
MONOS PCT: 5 %
Monocytes Absolute: 0.4 10*3/uL (ref 0.2–1.0)
Neutro Abs: 4.5 10*3/uL (ref 1.4–6.5)
Neutrophils Relative %: 58 %
Platelets: 280 10*3/uL (ref 150–440)
RBC: 3.22 MIL/uL — AB (ref 4.40–5.90)
RDW: 19.4 % — ABNORMAL HIGH (ref 11.5–14.5)
WBC: 7.7 10*3/uL (ref 3.8–10.6)

## 2016-11-18 LAB — FERRITIN: FERRITIN: 15 ng/mL — AB (ref 24–336)

## 2016-11-18 NOTE — Telephone Encounter (Signed)
Call placed to [facility] per patient request. CT reviewed. Informed her patient patient needs to see pulmonary medicine for further evaluation, which would include a bronchoscopy. Questions fielded by this NP. She plans to discuss this information with the patient and return a call either today or tomorrow with whether or not the patient will consent. In the event that he does not consent to the referral and needed bronchoscopy, we will readdress it with him in clinic at his RTC visit on Monday.

## 2016-11-18 NOTE — Progress Notes (Signed)
[facility] day:  11/18/2016  Chief Complaint: Donald Mcdowell. is a 71 y.o. male with colon cancer who is referred in consultation by Dr. Dahlia Byes for assessment and management.  HPI:   The patient presented with an isolated episode of bloody diarrhea on 07/01/2016.  He was noted to be anemic and ferrous sulfate 65 mg a day was initiated.  Exam on 09/04/2016 by Dr. Sanda Klein revealed left sided abdominal tenderness.  Stool was guaiac positive.  He declined colonoscopy.  Abdominal and pelvic CT on 09/10/2016 revealed no explanation for the patient's bloody stools.  There were multiple slightly complex renal structures c/w cysts.  He was seen by Dr. Sanda Klein on 09/15/2016 and agreed to endoscopy.  He was referred to GI.    He underwent upper and lower endoscopy on 11/05/2016 by Dr. Allen Norris.  EGD revealed a normal esophagus, gastritis, and a normal duodenum.  Pathology revealed chronic mild gastritis and features of a healing erosion in the stomach.  There was no H pylori, dysplasia or malignancy.  Colonoscopy revealed a large partially obstructing mass in the transverse colon.  In addition, there was one 6 mm polyp in the cecum, three 6 to 10 mm polyps in the ascending colon, one 6 mm polyp in the transverse colon, two 5 to 9 mm polyps in the descending colon, three 6 to 15 mm polyps in the sigmoid colon, and one 9 mm polyp in the rectum.  The transverse colon biopsy revealed adenocarcinoma.  Rectal polyp and one descending colon polyp revealed tubulovillous adenomas.  The other polyps were tubular adenomas.  There were 2 fragments showing high grade dysplasia in the sigmoid colon polyps.  There was no malignancy.  Patient reports that Dr. Allen Norris advised him that, in the absence of surgical intervention, he has a limited prognosis of 3 months. Patient ultimately agreed to a surgical consult with Dr. Dahlia Byes.    He was seen by Dr. Dahlia Byes in consultation on 11/09/2016.  Surgery  was discussed.  The patient was reluctant.  Abdomen and pelvic CT scan on 11/09/2016 revealed mid transverse colon segment of luminal narrowing with wall thickening compatible with colon cancer.  There was a single enlarged adjacent mesenteric lymph node and prominent subcentimeter lymph nodes anterior to the pancreas which may be metastatic.  There was no evidence of distant metastasis.  There was a left kidney lower pole complex cyst with thin internal septations (Bosniak IIF), usually benign.  Renal protocol CT or MRI was recommended at 6 and 12 months, then yearly for 5 years to demonstrate stability.  His prostate was enlarged and extended into the floor of bladder.  There was right kidney lower pole punctate nonobstructing nephrolithiasis.  He sees Dr. Jacqlyn Larsen for recurrent kidney stones and cystic kidney disease.   He was seen in follow-up by Dr. Dahlia Byes on 11/12/2016.  He was felt to have an attenuated form of familial adenomatous polyposis (FAP).  The minimal operation was felt to be an extended left hemicolectomy to include a sigmoid colectomy.  His best option was felt to be a total colectomy.  He felt comfortable with no medical or surgical intervention.  A second opinion was offered as well as an oncologic evaluation.  He was not interested.    Review of labs revealed a normal CBC on 04/28/2016.  Hematocrit was 40.0, hemoglobin 12.9, and MCV 12.9.  CBC on 08/28/2016 revealed a hematocrit of 31.8, hemoglobin 9.4, and MCV 82.8.  Labs on 09/04/2016 revealed a hematocrit of 29.7, hemoglobin 8.8.  Ferritin was 6 with an iron saturation of 4% and a TIBC of 383.  Labs on 11/09/2016 revealed a hematocrit of 28.1, hemoglobin 9.3, and MCV 87.9.  Creatinine was 1.25.  Liver function tests were normal.  CEA was 1.4.  Symptomatically, he has had several episodes of hematochezia. He reports anal pruritus.  He denies any abdominal pain.  He denies any chest pain, shortness of breath, and vertiginous symptoms.  He  denies fevers, sweats, or unexplained weight loss.  Patient reports that he actively trying to lose weight with dietary changes and walking 1-2 miles a day.    He is aware of his colon cancer diagnosis, however he is not interested in pursuing treatment. Patient states, "There is no quality of life associated with treatment. I will have a bag and I won't live that much longer. I am just not interested because I am not afraid to die".  He notes that his father passed from prostate cancer. Patient is unaware of other familial cancers.    Past Medical History:  Diagnosis Date  . Abdominal aortic atherosclerosis ([place]) 09/15/2016   CT scan June 2018  . Allergy   . Cancer ([place])   . Coronary artery calcification seen on CAT scan 09/15/2016   Previous smoker; noted on CT scan June 2018  . Diabetes mellitus without complication ([place])   . Hyperlipidemia   . Hypertension   . Kidney stones   . Obesity 08/20/2015    Past Surgical History:  Procedure Laterality Date  . COLONOSCOPY WITH PROPOFOL N/A 11/05/2016   Procedure: COLONOSCOPY WITH PROPOFOL;  Surgeon: Lucilla Lame, MD;  Location: [facility];  Service: Gastroenterology;  Laterality: N/A;  . ESOPHAGOGASTRODUODENOSCOPY (EGD) WITH PROPOFOL N/A 11/05/2016   Procedure: ESOPHAGOGASTRODUODENOSCOPY (EGD) WITH PROPOFOL;  Surgeon: Lucilla Lame, MD;  Location: [facility];  Service: Gastroenterology;  Laterality: N/A;  Diabetic - oral meds  . kidney stones    . POLYPECTOMY N/A 11/05/2016   Procedure: POLYPECTOMY;  Surgeon: Lucilla Lame, MD;  Location: [facility];  Service: Gastroenterology;  Laterality: N/A;    Family History  Problem Relation Age of Onset  . Alzheimer's disease Mother   . Heart disease Father   . Diabetes Father   . Cancer Father        Prostate  . Cancer Sister        skin cancer    Social History:  reports that he quit smoking about 5 months ago. His smoking use included Cigarettes. He has never used  smokeless tobacco. He reports that he does not drink alcohol or use drugs.  Patient worked for a [employer].  He retired in 1996.  He was in his 75s. Patient was a 1-2 pack per day smoker since he was a child.  He stopped smoking in 07/2016.  He drank alcohol in the past.  He has 3 living sisters (1 sister died).  He has no children.  The patient is accompanied by his wife [name]) and his neighbor [name]) today.  Allergies: No Known Allergies  Current Medications: Current Outpatient Prescriptions  Medication Sig Dispense Refill  . allopurinol (ZYLOPRIM) 300 MG tablet Take 1 tablet (300 mg total) by mouth daily. 90 tablet 3  . glipiZIDE (GLUCOTROL XL) 10 MG 24 hr tablet Take 1 tablet (10 mg total) by mouth 2 (two) times daily. 180 tablet 1  . glucose blood (ACCU-CHEK COMPACT STRIPS) test strip 100  each by Other route as needed for other. Use as instructed, check FSBS once a day; LON 99 months, E11.9 100 each 1  . Iron-Vitamin C (IRON 100/C PO) Take 65 mg by mouth daily.    . metFORMIN (GLUCOPHAGE) 1000 MG tablet Take 0.5 tablets (500 mg total) by mouth 2 (two) times daily. 90 tablet 0  . Omega-3 Fatty Acids (FISH OIL) 1200 MG CAPS Take by mouth 2 (two) times daily.    . quinapril (ACCUPRIL) 5 MG tablet Take 1 tablet (5 mg total) by mouth daily. 30 tablet 5  . rosuvastatin (CRESTOR) 20 MG tablet Take 0.5 tablets (10 mg total) by mouth daily. 45 tablet 1  . tamsulosin (FLOMAX) 0.4 MG CAPS capsule Take 0.4 mg by mouth daily.    . vitamin B-12 (CYANOCOBALAMIN) 100 MCG tablet Take 100 mcg by mouth daily.     No current facility-administered medications for this visit.     Review of Systems:  GENERAL:  Feels "ok".  No fevers or sweats.  Intentional weight loss (272 pounds in 04/2016). PERFORMANCE STATUS (ECOG):  1 HEENT:  No visual changes, runny nose, sore throat, mouth sores or tenderness. Lungs: No shortness of breath or cough.  No hemoptysis. Cardiac:  No chest pain,  palpitations, orthopnea, or PND. GI:  Bloody diarrhea, intermittent.  No nausea, vomiting, constipation, or melena. GU:  No urgency, frequency, dysuria, or hematuria. Musculoskeletal:  No back pain.  No joint pain.  No muscle tenderness. Extremities:  No pain or swelling. Skin:  No rashes or skin changes. Neuro:  No headache, numbness or weakness, balance or coordination issues. Endocrine:  Diabetes.  No thyroid issues, hot flashes or night sweats. Psych:  No mood changes, depression or anxiety. Pain:  No focal pain. Review of systems:  All other systems reviewed and found to be negative.  Physical Exam: Blood pressure 130/68, pulse 78, temperature 98 F (36.7 C), temperature source Tympanic, resp. rate 20, height _0  (1.854 m), weight 279 lb 1.6 oz (126.6 kg). GENERAL:  Well developed, well nourished, heavyset gentleman sitting comfortably in the exam room in no acute distress. MENTAL STATUS:  Alert and oriented to person, place and time. HEAD:  Thin grey hair.  Male pattern baldness.  Normocephalic, atraumatic, face symmetric, no Cushingoid features. EYES:  Glasses.  Blue eyes.  Pupils equal round and reactive to light and accomodation.  No conjunctivitis or scleral icterus. ENT:  Oropharynx clear without lesion.  Tongue normal. Mucous membranes moist.  RESPIRATORY:  Clear to auscultation without rales, wheezes or rhonchi. CARDIOVASCULAR:  Regular rate and rhythm without murmur, rub or gallop. ABDOMEN:  Fully round.  Soft, non-tender, with active bowel sounds, and no appreciable hepatosplenomegaly.  No masses. SKIN:  No rashes, ulcers or lesions. EXTREMITIES: No edema, no skin discoloration or tenderness.  No palpable cords. LYMPH NODES: No palpable cervical, supraclavicular, axillary or inguinal adenopathy  NEUROLOGICAL: Unremarkable. PSYCH:  Appropriate.   No visits with results within 3 Day(s) from this visit.  Latest known visit with results is:  Hospital Outpatient Visit on  11/09/2016  Component Date Value Ref Range Status  . CEA 11/09/2016 1.4  0.0 - 4.7 ng/mL Final   Comment: (NOTE)       Roche ECLIA methodology       Nonsmokers  <3.9  Smokers     <5.6 Performed At: [facility] [place], [place] [id number] Lindon Romp MD [id number]   . Sodium 11/09/2016 140  135 - 145 mmol/L Final  . Potassium 11/09/2016 4.1  3.5 - 5.1 mmol/L Final  . Chloride 11/09/2016 104  101 - 111 mmol/L Final  . CO2 11/09/2016 25  22 - 32 mmol/L Final  . Glucose, Bld 11/09/2016 147* 65 - 99 mg/dL Final  . BUN 11/09/2016 27* 6 - 20 mg/dL Final  . Creatinine, Ser 11/09/2016 1.25* 0.61 - 1.24 mg/dL Final  . Calcium 11/09/2016 9.9  8.9 - 10.3 mg/dL Final  . Total Protein 11/09/2016 6.9  6.5 - 8.1 g/dL Final  . Albumin 11/09/2016 3.8  3.5 - 5.0 g/dL Final  . AST 11/09/2016 23  15 - 41 U/L Final  . ALT 11/09/2016 26  17 - 63 U/L Final  . Alkaline Phosphatase 11/09/2016 44  38 - 126 U/L Final  . Total Bilirubin 11/09/2016 0.4  0.3 - 1.2 mg/dL Final  . GFR calc non Af Amer 11/09/2016 57* >60 mL/min Final  . GFR calc Af Amer 11/09/2016 >60  >60 mL/min Final   Comment: (NOTE) The eGFR has been calculated using the CKD EPI equation. This calculation has not been validated in all clinical situations. eGFR's persistently <60 mL/min signify possible Chronic Kidney Disease.   . Anion gap 11/09/2016 11  5 - 15 Final  . WBC 11/09/2016 6.2  3.8 - 10.6 K/uL Final  . RBC 11/09/2016 3.19* 4.40 - 5.90 MIL/uL Final  . Hemoglobin 11/09/2016 9.3* 13.0 - 18.0 g/dL Final  . HCT 11/09/2016 28.1* 40.0 - 52.0 % Final  . MCV 11/09/2016 87.9  80.0 - 100.0 fL Final  . MCH 11/09/2016 29.1  26.0 - 34.0 pg Final  . MCHC 11/09/2016 33.1  32.0 - 36.0 g/dL Final  . RDW 11/09/2016 18.8* 11.5 - 14.5 % Final  . Platelets 11/09/2016 272  150 - 440 K/uL Final  . Neutrophils Relative % 11/09/2016 57  % Final  . Neutro Abs 11/09/2016 3.6  1.4 - 6.5  K/uL Final  . Lymphocytes Relative 11/09/2016 33  % Final  . Lymphs Abs 11/09/2016 2.0  1.0 - 3.6 K/uL Final  . Monocytes Relative 11/09/2016 7  % Final  . Monocytes Absolute 11/09/2016 0.4  0.2 - 1.0 K/uL Final  . Eosinophils Relative 11/09/2016 2  % Final  . Eosinophils Absolute 11/09/2016 0.1  0 - 0.7 K/uL Final  . Basophils Relative 11/09/2016 1  % Final  . Basophils Absolute 11/09/2016 0.0  0 - 0.1 K/uL Final    Assessment:  Donald Mcdowell. is a 71 y.o. male with transverse colon cancer.  He presented with bloody diarrhea and iron deficiency anemia.  Colonoscopy on 11/05/2016 revealed a large partially obstructing mass in the transverse colon. Pathology confirmed adenocarcinoma.  In addition there were multiple other polyps (2 tubulovillous adenomas and 9 adenomas).  There were 2 fragments showing high grade dysplasia in the sigmoid colon polyps.  CEA was 1.4 on 11/09/2016.  Abdomen and pelvic CT on 11/09/2016 revealed mid transverse colon segment of luminal narrowing with wall thickening compatible with colon cancer.  There was a single enlarged adjacent mesenteric lymph node and prominent subcentimeter lymph nodes anterior to the pancreas which may be metastatic.  There was no evidence of distant metastasis.  There was a left kidney lower pole complex cyst with thin internal septations (Bosniak IIF).  His  prostate was enlarged and extended into the floor of bladder.  There was right kidney lower pole punctate nonobstructing nephrolithiasis.  EGD on 11/05/2016 revealed gastritis.  Pathology confirmed chronic mild gastritis and features of a healing erosion in the stomach.  There was no H pylori, dysplasia or malignancy.  He has iron deficiency anemia.  He is on oral iron.  Ferritin was 6 on 09/04/2016.  Hematocrit was 29.7 and hemoglobin 8.8 on 09/04/2016.  Hematocrit was 28.1, hemoglobin 9.3, and MCV 87.9 on 11/09/2016.   Symptomatically, he has had several episodes of hematochezia.   He denies any weight loss. Exam is unremarkable.  Hemoglobin is 9.6, hematocrit 29.3, and MCV 91.0.  Plan: 1.  Discuss diagnosis, staging, and management of colon cancer. Discussed potential complications that could arise from not treating his cancer. Patient educated on the possibility of profound anemia, bowel obstruction, and metastasis.  Family asking about "chemo pill". Discussed Xeloda as being an adjuvant treatment for colon cancer after resection if lymph nodes are involved.   Stressed the cure rates associated with surgical resection. Patient is not interested in having a colostomy and an extensive colectomy. Patient has several horror stories from friends that "scare" him.  2.  Discuss genetic testing to determine if patient has familial adenomatous polyposis (FAP).  Discuss option of colectomy or more frequent surveillance colonoscopies if patient has FAP.   Patient adamantly states that he is not interested in aggressive surgery. 3.  Present at tumor board on 11/19/2016. We will touch base with patient to discuss the consensus from the meeting.  Contact [name] [phone] after tumor board per patient request.  Patient, his wife, and [name] to then discuss and then return to clinic to finalize treatment decision. 4.  Chest CT (non-contrast) today to complete staging. 5.  Labs today: CBC with diff, ferritin, [facility] genetic testing. 6.  RTC for MD assessment on 11/23/2016 to review results and to finalize treatment plan.   [name], NP  11/18/2016, 11:00 AM   I saw and evaluated the patient, participating in the key portions of the service and reviewing pertinent diagnostic studies and records.  I reviewed the nurse practitioner's note and agree with the findings and the plan.  The assessment and plan were discussed with the patient.  Additional diagnostic studies of a chest CT and genetic testing are needed to complete staging and determine if the patient has a genetic predisposition to  colon cancer and would change the clinical management.  Multiple questions were asked by the patient and [name] and were answered to their satisfaction.  More than 1 hour was spent with the patient and his family.  At the end of our meeting, the patient appeared more amendable to pursuing treatment.   Lequita Asal, MD 11/18/2016,3:20 PM

## 2016-11-18 NOTE — Progress Notes (Signed)
Patient here today as new evaluation.  Referred by Dr. Dahlia Byes.  Patient has had several bloody stools.  No complaints of pain.  Appetite good.

## 2016-11-19 ENCOUNTER — Other Ambulatory Visit: Payer: Self-pay | Admitting: Urgent Care

## 2016-11-19 DIAGNOSIS — J9809 Other diseases of bronchus, not elsewhere classified: Secondary | ICD-10-CM

## 2016-11-23 ENCOUNTER — Inpatient Hospital Stay (HOSPITAL_BASED_OUTPATIENT_CLINIC_OR_DEPARTMENT_OTHER): Payer: Medicare Other | Admitting: Hematology and Oncology

## 2016-11-23 VITALS — BP 149/68 | HR 91 | Temp 98.2°F | Wt 277.7 lb

## 2016-11-23 DIAGNOSIS — E040 Nontoxic diffuse goiter: Secondary | ICD-10-CM | POA: Diagnosis not present

## 2016-11-23 DIAGNOSIS — R911 Solitary pulmonary nodule: Secondary | ICD-10-CM | POA: Insufficient documentation

## 2016-11-23 DIAGNOSIS — D125 Benign neoplasm of sigmoid colon: Secondary | ICD-10-CM | POA: Diagnosis not present

## 2016-11-23 DIAGNOSIS — F17210 Nicotine dependence, cigarettes, uncomplicated: Secondary | ICD-10-CM | POA: Diagnosis not present

## 2016-11-23 DIAGNOSIS — Z79899 Other long term (current) drug therapy: Secondary | ICD-10-CM | POA: Diagnosis not present

## 2016-11-23 DIAGNOSIS — D509 Iron deficiency anemia, unspecified: Secondary | ICD-10-CM

## 2016-11-23 DIAGNOSIS — K922 Gastrointestinal hemorrhage, unspecified: Secondary | ICD-10-CM

## 2016-11-23 DIAGNOSIS — C184 Malignant neoplasm of transverse colon: Secondary | ICD-10-CM | POA: Diagnosis not present

## 2016-11-23 DIAGNOSIS — R918 Other nonspecific abnormal finding of lung field: Secondary | ICD-10-CM

## 2016-11-23 DIAGNOSIS — K2970 Gastritis, unspecified, without bleeding: Secondary | ICD-10-CM

## 2016-11-23 NOTE — Progress Notes (Signed)
[facility] day:  11/23/2016  Chief Complaint: Donald Mcdowell. is a 71 y.o. male with colon cancer with associated rectal bleeding who is seen for 1 week assessment and finalization of treatment plans.  HPI:   The patient was last seen in the medical oncology clinic on 11/18/2016 for initial consultation.  At that time, he had recently diagnosed col cancer involving the transverse colon as well as dysplastic lesions in the sigmoid colon.  He had 11 polyps noted on colonoscopsy (his first).  He had no family history of FAP.  He was felt to possibly have a form of attenuated FAP.  Genetic testing was performed.  He was not interested in an extended left hemicolectomy or total colectomy with colostomy.  We discussed addressing his colon cancer with surgery and performing surveillance colonoscopies.  We discussed chest CT for complete staging.  Chest CT without contrast on 11/18/2016 revealed no definite findings to suggest metastatic disease to the thorax.  There was what appears to be a bronchocele in the medial aspect of the left upper lobe. This implied obstruction of the bronchus, which could relate to an endobronchial polyp, endobronchial tumor, bronchial stricture or aspirated foreign body.  He was referred to pulmonary medicine for bronchoscopy.  Labs at last visit included a hematocrit of 29.3, hemoglobin 9.6, and MCV 91.  Ferritin was 15.  During the interim, he has been doing well.  He notes no major changes from previous assessment last week. He notes 2-3 days of constipation.  He denies any further obvious GI blood loss. He denies fever, sweats, significant weight loss, or unexplained bruising. Patient's diet is good. He notes a cousin who passed from lymphoma at the age of 79.   Past Medical History:  Diagnosis Date  . Abdominal aortic atherosclerosis ([place]) 09/15/2016   CT scan June 2018  . Allergy   . Cancer ([facility])   . Coronary artery  calcification seen on CAT scan 09/15/2016   Previous smoker; noted on CT scan June 2018  . Diabetes mellitus without complication ([place])   . Hyperlipidemia   . Hypertension   . Kidney stones   . Obesity 08/20/2015    Past Surgical History:  Procedure Laterality Date  . COLONOSCOPY WITH PROPOFOL N/A 11/05/2016   Procedure: COLONOSCOPY WITH PROPOFOL;  Surgeon: Lucilla Lame, MD;  Location: [facility];  Service: Gastroenterology;  Laterality: N/A;  . ESOPHAGOGASTRODUODENOSCOPY (EGD) WITH PROPOFOL N/A 11/05/2016   Procedure: ESOPHAGOGASTRODUODENOSCOPY (EGD) WITH PROPOFOL;  Surgeon: Lucilla Lame, MD;  Location: [facility];  Service: Gastroenterology;  Laterality: N/A;  Diabetic - oral meds  . kidney stones    . POLYPECTOMY N/A 11/05/2016   Procedure: POLYPECTOMY;  Surgeon: Lucilla Lame, MD;  Location: [facility];  Service: Gastroenterology;  Laterality: N/A;    Family History  Problem Relation Age of Onset  . Alzheimer's disease Mother   . Heart disease Father   . Diabetes Father   . Cancer Father        Prostate  . Cancer Sister        skin cancer    Social History:  reports that he quit smoking about 5 months ago. His smoking use included Cigarettes. He has never used smokeless tobacco. He reports that he does not drink alcohol or use drugs.  Patient worked for a [employer].  He retired in 1996.  He was in his 6s. Patient was a 1-2 pack per day  smoker since he was a child.  He stopped smoking in 07/2016.  He drank alcohol in the past.  He has 3 living sisters (1 sister died).  He has no children.  The patient is accompanied by his wife [name]) and his neighbor [name]) today. Communicate with [name] [phone].  Allergies: No Known Allergies  Current Medications: Current Outpatient Prescriptions  Medication Sig Dispense Refill  . allopurinol (ZYLOPRIM) 300 MG tablet Take 1 tablet (300 mg total) by mouth daily. 90 tablet 3  . glipiZIDE (GLUCOTROL  XL) 10 MG 24 hr tablet Take 1 tablet (10 mg total) by mouth 2 (two) times daily. 180 tablet 1  . glucose blood (ACCU-CHEK COMPACT STRIPS) test strip 100 each by Other route as needed for other. Use as instructed, check FSBS once a day; LON 99 months, E11.9 100 each 1  . Iron-Vitamin C (IRON 100/C PO) Take 65 mg by mouth daily.    . metFORMIN (GLUCOPHAGE) 1000 MG tablet Take 0.5 tablets (500 mg total) by mouth 2 (two) times daily. 90 tablet 0  . Omega-3 Fatty Acids (FISH OIL) 1200 MG CAPS Take by mouth 2 (two) times daily.    . quinapril (ACCUPRIL) 5 MG tablet Take 1 tablet (5 mg total) by mouth daily. 30 tablet 5  . rosuvastatin (CRESTOR) 20 MG tablet Take 0.5 tablets (10 mg total) by mouth daily. 45 tablet 1  . tamsulosin (FLOMAX) 0.4 MG CAPS capsule Take 0.4 mg by mouth daily.    . vitamin B-12 (CYANOCOBALAMIN) 100 MCG tablet Take 100 mcg by mouth daily.     No current facility-administered medications for this visit.     Review of Systems:  GENERAL:  Feels "ok".  No fevers or sweats.  Intentional weight loss (272 pounds in 04/2016). PERFORMANCE STATUS (ECOG):  1 HEENT:  No visual changes, runny nose, sore throat, mouth sores or tenderness. Lungs: No shortness of breath or cough.  No hemoptysis. Cardiac:  No chest pain, palpitations, orthopnea, or PND. GI:  Bloody diarrhea, intermittent.  No nausea, vomiting, constipation, or melena. GU:  No urgency, frequency, dysuria, or hematuria. Musculoskeletal:  No back pain.  No joint pain.  No muscle tenderness. Extremities:  No pain or swelling. Skin:  No rashes or skin changes. Neuro:  No headache, numbness or weakness, balance or coordination issues. Endocrine:  Diabetes.  No thyroid issues, hot flashes or night sweats. Psych:  No mood changes, depression or anxiety. Pain:  No focal pain. Review of systems:  All other systems reviewed and found to be negative.  Physical Exam: Blood pressure (!) 149/68, pulse 91, temperature 98.2 F (36.8  C), temperature source Tympanic, weight 277 lb 11.2 oz (126 kg). GENERAL:  Well developed, well nourished, heavyset gentleman sitting comfortably in the exam room in no acute distress. MENTAL STATUS:  Alert and oriented to person, place and time. HEAD:  Thin grey hair.  Male pattern baldness.  Normocephalic, atraumatic, face symmetric, no Cushingoid features. EYES:  Glasses.  Blue eyes.  No conjunctivitis or scleral icterus. NEUROLOGICAL: Unremarkable. PSYCH:  Appropriate.   No visits with results within 3 Day(s) from this visit.  Latest known visit with results is:  Office Visit on 11/18/2016  Component Date Value Ref Range Status  . WBC 11/18/2016 7.7  3.8 - 10.6 K/uL Final  . RBC 11/18/2016 3.22* 4.40 - 5.90 MIL/uL Final  . Hemoglobin 11/18/2016 9.6* 13.0 - 18.0 g/dL Final  . HCT 11/18/2016 29.3* 40.0 - 52.0 % Final  .  MCV 11/18/2016 91.0  80.0 - 100.0 fL Final  . MCH 11/18/2016 29.8  26.0 - 34.0 pg Final  . MCHC 11/18/2016 32.7  32.0 - 36.0 g/dL Final  . RDW 11/18/2016 19.4* 11.5 - 14.5 % Final  . Platelets 11/18/2016 280  150 - 440 K/uL Final  . Neutrophils Relative % 11/18/2016 58  % Final  . Neutro Abs 11/18/2016 4.5  1.4 - 6.5 K/uL Final  . Lymphocytes Relative 11/18/2016 33  % Final  . Lymphs Abs 11/18/2016 2.5  1.0 - 3.6 K/uL Final  . Monocytes Relative 11/18/2016 5  % Final  . Monocytes Absolute 11/18/2016 0.4  0.2 - 1.0 K/uL Final  . Eosinophils Relative 11/18/2016 3  % Final  . Eosinophils Absolute 11/18/2016 0.2  0 - 0.7 K/uL Final  . Basophils Relative 11/18/2016 1  % Final  . Basophils Absolute 11/18/2016 0.1  0 - 0.1 K/uL Final  . Ferritin 11/18/2016 15* 24 - 336 ng/mL Final    Assessment:  Braydin Aloi. is a 71 y.o. male with transverse colon cancer.  He presented with bloody diarrhea and iron deficiency anemia.  Colonoscopy on 11/05/2016 revealed a large partially obstructing mass in the transverse colon. Pathology confirmed adenocarcinoma.  In addition  there were multiple other polyps (2 tubulovillous adenomas and 9 adenomas).  There were 2 fragments showing high grade dysplasia in the sigmoid colon polyps.  CEA was 1.4 on 11/09/2016.  Abdomen and pelvic CT on 11/09/2016 revealed mid transverse colon segment of luminal narrowing with wall thickening compatible with colon cancer.  There was a single enlarged adjacent mesenteric lymph node and prominent subcentimeter lymph nodes anterior to the pancreas which may be metastatic.  There was no evidence of distant metastasis.  There was a left kidney lower pole complex cyst with thin internal septations (Bosniak IIF).  His prostate was enlarged and extended into the floor of bladder.  There was right kidney lower pole punctate nonobstructing nephrolithiasis.  Chest CT on 11/18/2016 revealed no metastatic disease.  There was what appears to be a bronchocele in the medial aspect of the left upper lobe. This implied obstruction of the bronchus, which could relate to an endobronchial polyp, endobronchial tumor, bronchial stricture or aspirated foreign body.   EGD on 11/05/2016 revealed gastritis.  Pathology confirmed chronic mild gastritis and features of a healing erosion in the stomach.  There was no H pylori, dysplasia or malignancy.  He has iron deficiency anemia.  He is on oral iron.  Ferritin was 6 on 09/04/2016.  Hematocrit was 29.7 and hemoglobin 8.8 on 09/04/2016.  Hematocrit was 28.1, hemoglobin 9.3, and MCV 87.9 on 11/09/2016.   Symptomatically, he has had some constipation.  He denies any recent rectal bleeding.  Exam is unremarkable.    Plan: 1.  Discuss interim labs and chest CT.  Discuss no evidence of metastatic disease.  Discuss possible bronchocele in the left upper lobe.  Patient has been referred to pulmonary medicine. 2.  Discuss tumor board discussions.  Agree with resection of transverse colon malignancy without extended left hemicolectomy or total colectomy. 3.  Discuss consequences  of no surgery.  We discussed symptom management.  We discussed pain, bleeding, and obstruction.  Hospice was discussed. 5.  Genetic testing for familial adenomatous polyposis (FAP) pending at this time.  6.  Refer back to Dr. Dahlia Byes for surgery.  Dr. Dahlia Byes to determine timing of bronchoscopy with Dr. Alva Garnet. 7.  RTC after surgery.  Patient to call the  clinic for an appointment date.    [name], NP  11/23/2016, 4:04 PM   I saw and evaluated the patient, participating in the key portions of the service and reviewing pertinent diagnostic studies and records.  I reviewed the nurse practitioner's note and agree with the findings and the plan.  The assessment and plan were discussed with the patient.  Multiple questions were asked by the patient and answered.   Lequita Asal, MD 11/23/2016,5:25 PM

## 2016-11-28 ENCOUNTER — Encounter: Payer: Self-pay | Admitting: Hematology and Oncology

## 2016-12-01 NOTE — Progress Notes (Signed)
[facility] Consultation      Assessment and Plan:  The patient is a 71 year old male with recently diagnosed colon cancer, planned for a colectomy. He had an incidentally found left upper lobe endobronchial lesion, of uncertain etiology. We are planning for a navigational bronchoscopy to rule out metastatic or primary lung cancer before his upcoming colectomy.  Lung nodule/bronchocele. -We discussed in depth, The left upper lobe finding, which may represent a bronchocele versus malignancy. I am hopeful that this is not representative of malignancy. We will plan for an ENB bronchoscopy by Dr. Mortimer Fries. -We will plan for bronchoscopy to be completed before his upcoming colectomy.  Preoperative pulmonary exam/surgical clearance. -Discussed the risks of surgery, the patient is cleared for surgery tentatively after the bronchoscopy. We would like to see that he has recovered from his bronchoscopy procedure before undergoing surgery.   Date: 12/01/2016  MRN# [id number] Donald Mcdowell 04-03-1945    Donald Pou. is a 71 y.o. old male seen in consultation for chief complaint of:    Chief Complaint  Patient presents with  . Advice Only    referred by Dr.Pabon and [facility] center for evaluation bronchocele. Pt also needs surgery clearance for Dr. Dahlia Byes.  . Cough    Pt has thick clear mucus.    HPI:   The patient is recently diagnosed with colon cancer. He declined surgical therapy, he underwent a CT scan to evaluate staging, this showed a LUL endobronchial abnormality. This was discussed at tumor board, it was felt that this may represent a bronchocele vs. Primary or metastatic ca. Further clarification was needed to plan treatment of the patient's colon ca.   On my personal review of Ct scan there is an endobronchial obstruction in sub-segmental bronchus of the anterior segment of the LUL. The CT is non-contrasted, there does not appear to be significant mediastinal  lymphadenopathy or other nodules.   He is planned to undergo colectomy for colon cancer. He is a former smoker, quit earlier this year. He denies history of significant respiratory disease, he denies significant dyspnea symptoms.    PMHX:   Past Medical History:  Diagnosis Date  . Abdominal aortic atherosclerosis ([place]) 09/15/2016   CT scan June 2018  . Allergy   . Cancer ([place])   . Coronary artery calcification seen on CAT scan 09/15/2016   Previous smoker; noted on CT scan June 2018  . Diabetes mellitus without complication ([place])   . Hyperlipidemia   . Hypertension   . Kidney stones   . Obesity 08/20/2015   Surgical Hx:  Past Surgical History:  Procedure Laterality Date  . COLONOSCOPY WITH PROPOFOL N/A 11/05/2016   Procedure: COLONOSCOPY WITH PROPOFOL;  Surgeon: Lucilla Lame, MD;  Location: [facility];  Service: Gastroenterology;  Laterality: N/A;  . ESOPHAGOGASTRODUODENOSCOPY (EGD) WITH PROPOFOL N/A 11/05/2016   Procedure: ESOPHAGOGASTRODUODENOSCOPY (EGD) WITH PROPOFOL;  Surgeon: Lucilla Lame, MD;  Location: [facility];  Service: Gastroenterology;  Laterality: N/A;  Diabetic - oral meds  . kidney stones    . POLYPECTOMY N/A 11/05/2016   Procedure: POLYPECTOMY;  Surgeon: Lucilla Lame, MD;  Location: [facility];  Service: Gastroenterology;  Laterality: N/A;   Family Hx:  Family History  Problem Relation Age of Onset  . Alzheimer's disease Mother   . Heart disease Father   . Diabetes Father   . Cancer Father        Prostate  . Cancer Sister  skin cancer   Social Hx:   Social History  Substance Use Topics  . Smoking status: Former Smoker    Types: Cigarettes    Quit date: 06/2016  . Smokeless tobacco: Never Used  . Alcohol use No   Medication:    Current Outpatient Prescriptions:  .  allopurinol (ZYLOPRIM) 300 MG tablet, Take 1 tablet (300 mg total) by mouth daily., Disp: 90 tablet, Rfl: 3 .  glipiZIDE (GLUCOTROL XL) 10 MG 24 hr tablet, Take  1 tablet (10 mg total) by mouth 2 (two) times daily., Disp: 180 tablet, Rfl: 1 .  glucose blood (ACCU-CHEK COMPACT STRIPS) test strip, 100 each by Other route as needed for other. Use as instructed, check FSBS once a day; LON 99 months, E11.9, Disp: 100 each, Rfl: 1 .  Iron-Vitamin C (IRON 100/C PO), Take 65 mg by mouth daily., Disp: , Rfl:  .  metFORMIN (GLUCOPHAGE) 1000 MG tablet, Take 0.5 tablets (500 mg total) by mouth 2 (two) times daily., Disp: 90 tablet, Rfl: 0 .  Omega-3 Fatty Acids (FISH OIL) 1200 MG CAPS, Take by mouth 2 (two) times daily., Disp: , Rfl:  .  quinapril (ACCUPRIL) 5 MG tablet, Take 1 tablet (5 mg total) by mouth daily., Disp: 30 tablet, Rfl: 5 .  rosuvastatin (CRESTOR) 20 MG tablet, Take 0.5 tablets (10 mg total) by mouth daily., Disp: 45 tablet, Rfl: 1 .  tamsulosin (FLOMAX) 0.4 MG CAPS capsule, Take 0.4 mg by mouth daily., Disp: , Rfl:  .  vitamin B-12 (CYANOCOBALAMIN) 100 MCG tablet, Take 100 mcg by mouth daily., Disp: , Rfl:    Allergies:  Patient has no known allergies.  Review of Systems: Gen:  Denies  fever, sweats, chills HEENT: Denies blurred vision, double vision. bleeds, sore throat Cvc:  No dizziness, chest pain. Resp:   Denies cough or sputum production, shortness of breath Gi: Denies swallowing difficulty, stomach pain. Gu:  Denies bladder incontinence, burning urine Ext:   No Joint pain, stiffness. Skin: No skin rash,  hives  Endoc:  No polyuria, polydipsia. Psych: No depression, insomnia. Other:  All other systems were reviewed with the patient and were negative other that what is mentioned in the HPI.   Physical Examination:   VS: BP (!) 146/68 (BP Location: Left Arm, Cuff Size: Normal)   Pulse 98   Resp 16   Ht 6\' 1"  (1.854 m)   Wt 280 lb (127 kg)   SpO2 98%   BMI 36.94 kg/m   General Appearance: No distress  Neuro:without focal findings,  speech normal,  HEENT: PERRLA, EOM intact.   Pulmonary: normal breath sounds, No wheezing.    CardiovascularNormal S1,S2.  No m/r/g.   Abdomen: Benign, Soft, non-tender. Renal:  No costovertebral tenderness  GU:  No performed at this time. Endoc: No evident thyromegaly, no signs of acromegaly. Skin:   warm, no rashes, no ecchymosis  Extremities: normal, no cyanosis, clubbing.  Other findings:    LABORATORY PANEL:   CBC No results for input(s): WBC, HGB, HCT, PLT in the last 168 hours. ------------------------------------------------------------------------------------------------------------------  Chemistries  No results for input(s): NA, K, CL, CO2, GLUCOSE, BUN, CREATININE, CALCIUM, MG, AST, ALT, ALKPHOS, BILITOT in the last 168 hours.  Invalid input(s): GFRCGP ------------------------------------------------------------------------------------------------------------------  Cardiac Enzymes No results for input(s): TROPONINI in the last 168 hours. ------------------------------------------------------------  RADIOLOGY:  No results found.     Thank  you for the consultation and for allowing [facility] to assist in the care  of your patient. Our recommendations are noted above.  Please contact us if we can be of further service.   Marda Stalker, MD.  Board Certified in Internal Medicine, Pulmonary Medicine, [place], and Sleep Medicine.  [facility] Office Number: [phone]  Patricia Pesa, M.D.  [place]erton Border, M.D  12/01/2016

## 2016-12-02 ENCOUNTER — Encounter: Payer: Self-pay | Admitting: Surgery

## 2016-12-02 ENCOUNTER — Ambulatory Visit (INDEPENDENT_AMBULATORY_CARE_PROVIDER_SITE_OTHER): Payer: Medicare Other | Admitting: Surgery

## 2016-12-02 ENCOUNTER — Telehealth: Payer: Self-pay | Admitting: Surgery

## 2016-12-02 VITALS — BP 145/68 | HR 84 | Temp 97.3°F | Ht 73.0 in | Wt 276.5 lb

## 2016-12-02 DIAGNOSIS — C184 Malignant neoplasm of transverse colon: Secondary | ICD-10-CM

## 2016-12-02 MED ORDER — POLYETHYLENE GLYCOL 3350 17 GM/SCOOP PO POWD: ORAL | 0 refills | Status: DC

## 2016-12-02 MED ORDER — NEOMYCIN SULFATE 500 MG PO TABS: ORAL_TABLET | ORAL | 0 refills | Status: DC

## 2016-12-02 MED ORDER — ERYTHROMYCIN BASE 500 MG PO TABS: ORAL_TABLET | ORAL | 0 refills | Status: DC

## 2016-12-02 NOTE — Telephone Encounter (Signed)
Pt advised of pre op date/time and sx date. Sx: 12/28/16 with Dr Pabon-[facility]-Laparoscopic hemicolectomy, possible open.  Pre op: 12/21/16 @ 10:00am--Office.   Patient made aware to call [phone], between 1-3:00pm the Friday before surgery, to find out what time to arrive.

## 2016-12-02 NOTE — Patient Instructions (Addendum)
We have seen you today in regards to the Mass in your Colon.  Our plan going forward will be: Laparoscopic Hemicolectomy with Dr. Dahlia Byes on 10/15  We will send over Pulmonary Clearance to Dr. Juanell Fairly  Please see your blue sheet for further instructions.  Please call our office and speak with a nurse if you have any questions or concerns prior to your next appointment.  Colon Mass, Adult A colon mass is a growth in your colon. The colon is your large intestine. What are the causes? Many things can cause a colon mass, including:  Cancer.  Blood vessel problems.  Infection.  Inflammatory bowel disease (IBS).  Diverticulitis. This is inflammation or infection of small pouches in your colon.  Endometriosis. This is a condition in which the tissue that lines the uterus grows outside of its normal location.  Volvulus. This is a condition in which the colon twists on the organ or tissue that supports the colon (mesentery).  What are the signs or symptoms? Symptoms of a colon mass include:  Cramping.  Nausea.  Diarrhea.  Fever.  Vomiting.  Feeling weak.  Pain in the abdomen, side, or back.  Weight loss.  Constipation.  Bleeding from your rectum.  Changes in bowel habits.  Feeling the need for bowel a movement despite having had one recently.  In some cases, no symptoms are present. How is this diagnosed? To make a diagnosis, your health care provider will need to learn more about the mass. You may have tests or procedures done, such as:  Blood tests.  X-rays.  An ultrasound.  A CT scan.  An MRI.  A colonoscopy.  A biopsy of the mass.  In some cases, what seemed like a colon mass may actually be something else, such as scarring that formed after a surgery or an ulcer. How is this treated? Treatment will depend on the cause of the mass. Your health care provider will discuss your test results with you, the meaning of the tests, and the recommended steps for  starting treatment. In serious cases, emergency surgery may be recommended immediately. Follow these instructions at home: What you need to do at home will depend on the cause of the mass. Follow the instructions that your health care provider gives to you. In general:  Keep all follow-up visits as directed by your health care provider. This is important.  Take medicines only as directed by your health care provider.  Contact a health care provider if:  You develop new symptoms.  You have a fever.  Your pain does not get better with medicine.  You feel weaker.  You develop easy bruising or bleeding. Get help right away if:  You vomit bright red blood or material that looks like coffee grounds.  You have blood in your stools, or your stools turn black and tarry.  You faint.  You feel that the mass has suddenly gotten larger.  You develop severe bloating in your abdomen. This information is not intended to replace advice given to you by your health care provider. Make sure you discuss any questions you have with your health care provider. Document Released: 06/09/2006 Document Revised: 08/08/2015 Document Reviewed: 10/01/2013 Elsevier Interactive Patient Education  2018 [place] Liquid Diet, Adult A clear liquid diet is a diet that includes only liquids that you can see through. You may need to follow a clear liquid diet if:  You develop a medical condition right before or after  you have surgery.  You were not able to eat food for a long period of time.  You had a condition that gave you diarrhea.  You are going to have an exam, such as a colonoscopy, in which instruments will be put into your body to look at parts of your digestive system.  You are going to have bowel surgery.  The usual goals of this diet are:  To rest the stomach and digestive system as much as possible.  To keep you hydrated.  To make sure you get some calories for  energy.  To help you return to normal digestion.  Most people need to follow this diet for only a short period of time. What do I need to know about this diet?  A clear liquid is a liquid that you can see through when you hold it up to a light.  A clear liquid diet does not provide all the nutrients that you need. It is important to choose a variety of the liquids that are allowed on this diet. That way, you will get as many nutrients as possible.  If you are not sure whether you can have certain items, ask your health care provider. What can I have?  Water and flavored water.  Fruit juices that do not have pulp, such as cranberry juice and apple juice.  Tea and coffee without milk or cream.  Clear bouillon or broth.  Broth-based soups that have been strained.  Flavored gelatins.  Honey.  Sugar water.  Frozen ice or frozen ice pops that do not contain milk, yogurt, fruit pieces, or fruit pulp.  Clear sodas.  Clear sports drinks. The items listed above may not be a complete list of recommended liquids. Contact your dietitian for more options. What can I not have?  Juices that have pulp.  Milk.  Cream or cream-based soups.  Yogurt. The items listed above may not be a complete list of liquids to avoid. Contact your dietitian for more information. Summary  A clear liquid diet is a diet that includes only liquids that you can see through.  The goal of this diet is to help you recover by resting your digestive system, keeping you hydrated, and providing nutrients.  Make sure to avoid liquids with milk, cream, or pulp while on this diet. This information is not intended to replace advice given to you by your health care provider. Make sure you discuss any questions you have with your health care provider. Document Released: 03/02/2005 Document Revised: 10/15/2015 Document Reviewed: 01/27/2013 Elsevier Interactive Patient Education  [employer].

## 2016-12-02 NOTE — Progress Notes (Signed)
Surgical Consultation  12/02/2016  Donald Mcdowell. is an 71 y.o. male.   Chief Complaint  Patient presents with  . Follow-up    discuss having surgery     HPI:  Had a recent colonoscopy and there were 11 polyps and a circumferential mass within the transverse colon. Pathology has been reviewed and there is an adenocarcinoma from the circumferential mass on the transverse colon and there is also some high-grade dysplasia within 2 polyps that were located within the sigmoid colon. Ct abd no distant mets. CT chest Left upper lobe bronchocele. He does have an appt for pulm possible bronchoscopy. He has seen medical oncology now he has stopped and is open to surgical resection. Case discussed in detail with Dr. Mike Gip. I once again have described the options of total abdominal colectomy versus an extended left hemicolectomy versus an extended right hemicolectomy. After careful consideration patient wishes to do an extended right hemicolectomy as this will decrease the chances of anastomotic leak and decrease his morbidity. He is not willing to go a major turn abdominal colectomy. He does understand that a right extended hemicolectomy will not address the high-grade dysplasia that in ideal circumstances will require a radical resection. He understands and wishes to undergo surveillance colonoscopy for dose sigmoid lesions.  Past Medical History:  Diagnosis Date  . Abdominal aortic atherosclerosis ([place]) 09/15/2016   CT scan June 2018  . Allergy   . Cancer ([place])   . Coronary artery calcification seen on CAT scan 09/15/2016   Previous smoker; noted on CT scan June 2018  . Diabetes mellitus without complication ([place])   . Hyperlipidemia   . Hypertension   . Kidney stones   . Obesity 08/20/2015    Past Surgical History:  Procedure Laterality Date  . COLONOSCOPY WITH PROPOFOL N/A 11/05/2016   Procedure: COLONOSCOPY WITH PROPOFOL;  Surgeon: Lucilla Lame, MD;  Location: [facility];   Service: Gastroenterology;  Laterality: N/A;  . ESOPHAGOGASTRODUODENOSCOPY (EGD) WITH PROPOFOL N/A 11/05/2016   Procedure: ESOPHAGOGASTRODUODENOSCOPY (EGD) WITH PROPOFOL;  Surgeon: Lucilla Lame, MD;  Location: [facility];  Service: Gastroenterology;  Laterality: N/A;  Diabetic - oral meds  . kidney stones    . POLYPECTOMY N/A 11/05/2016   Procedure: POLYPECTOMY;  Surgeon: Lucilla Lame, MD;  Location: [facility];  Service: Gastroenterology;  Laterality: N/A;    Family History  Problem Relation Age of Onset  . Alzheimer's disease Mother   . Heart disease Father   . Diabetes Father   . Cancer Father        Prostate  . Cancer Sister        skin cancer    Social History:  reports that he quit smoking about 5 months ago. His smoking use included Cigarettes. He has never used smokeless tobacco. He reports that he does not drink alcohol or use drugs.  Allergies: No Known Allergies  Medications reviewed.     ROS Full ROS performed and is otherwise negative other than what is stated in the HPI    BP (!) 145/68   Pulse 84   Temp (!) 97.3 F (36.3 C) (Oral)   Ht 6\' 1"  (1.854 m)   Wt 125.4 kg (276 lb 8 oz)   BMI 36.48 kg/m   Physical Exam  Constitutional: He is oriented to person, place, and time and well-developed, well-nourished, and in no distress. No distress.  Neck: No JVD present.  Cardiovascular: Normal rate and intact distal pulses.   Pulmonary/Chest: Effort  normal. No stridor. No respiratory distress. He exhibits no tenderness.  Abdominal: Soft. He exhibits no distension. There is no tenderness. There is no rebound and no guarding.  Neurological: He is alert and oriented to person, place, and time. Gait normal. GCS score is 15.  Skin: Skin is warm and dry. He is not diaphoretic.  Psychiatric: Mood, memory, affect and judgment normal.      No results found for this or any previous visit (from the past 48 hour(s)). No results  found.  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 in addition with high grade dysplasia on 2 polyps in the sigmoid colon. He has decided on proceeding with an extended right hemicolectomy with primary anastomosis and surveillance of the sigmoid lesions. I've also discussed with medical oncology and we'll agreed that this is the least invasive form to continue with his current cancer. I have described with the patient in detail about the procedure, risks, benefits and possible complications including but not limited to: Bleeding, anastomotic leak, infection, re-interventions, anesthesia complications and conversion to open. Plan for laparoscopic extended right hemicolectomy possible open. I spent more than 45 minutes in this encounter with greater than 50% of the time spent in counseling    Caroleen Hamman, MD [facility]

## 2016-12-03 ENCOUNTER — Encounter: Payer: Self-pay | Admitting: Internal Medicine

## 2016-12-03 ENCOUNTER — Ambulatory Visit (INDEPENDENT_AMBULATORY_CARE_PROVIDER_SITE_OTHER): Payer: Medicare Other | Admitting: Internal Medicine

## 2016-12-03 VITALS — BP 146/68 | HR 98 | Resp 16 | Ht 73.0 in | Wt 280.0 lb

## 2016-12-03 DIAGNOSIS — J9809 Other diseases of bronchus, not elsewhere classified: Secondary | ICD-10-CM | POA: Diagnosis not present

## 2016-12-03 DIAGNOSIS — R911 Solitary pulmonary nodule: Secondary | ICD-10-CM

## 2016-12-03 NOTE — Patient Instructions (Signed)
--  We will be setting him up for a bronchoscopy with biopsy with Dr. Mortimer Fries. We will be calling you to set up this procedure.

## 2016-12-08 ENCOUNTER — Telehealth: Payer: Self-pay

## 2016-12-08 ENCOUNTER — Telehealth: Payer: Self-pay | Admitting: Internal Medicine

## 2016-12-08 NOTE — Telephone Encounter (Signed)
Pt wife would like to know more information about pt bronchoscopy. Please call

## 2016-12-08 NOTE — Telephone Encounter (Signed)
Patient is scheduled for a Laparoscopic Extended Right Hemicolectomy that may possibly be converted to open. This will be done on 12/28/16 by Dr. Dahlia Byes and will need home health post-op.  Home Health referral information was given at this time to [facility] (Referral Coordinator) with [facility].

## 2016-12-08 NOTE — Telephone Encounter (Signed)
Spoke with wife and answered her questions in regards to the PAT appt and the pt's procedure date and time. Nothing further needed.

## 2016-12-10 ENCOUNTER — Encounter
Admission: RE | Admit: 2016-12-10 | Discharge: 2016-12-10 | Disposition: A | Discharge status: Home / Self Care | Payer: Medicare Other | Source: Ambulatory Visit | Attending: Internal Medicine | Admitting: Internal Medicine

## 2016-12-10 ENCOUNTER — Telehealth: Payer: Self-pay

## 2016-12-10 DIAGNOSIS — I10 Essential (primary) hypertension: Secondary | ICD-10-CM | POA: Diagnosis not present

## 2016-12-10 DIAGNOSIS — C184 Malignant neoplasm of transverse colon: Secondary | ICD-10-CM | POA: Diagnosis not present

## 2016-12-10 DIAGNOSIS — Z01818 Encounter for other preprocedural examination: Secondary | ICD-10-CM | POA: Diagnosis not present

## 2016-12-10 DIAGNOSIS — I440 Atrioventricular block, first degree: Secondary | ICD-10-CM | POA: Insufficient documentation

## 2016-12-10 HISTORY — DX: Gastro-esophageal reflux disease without esophagitis: K21.9

## 2016-12-10 HISTORY — DX: Gout, unspecified: M10.9

## 2016-12-10 HISTORY — DX: Disorder of kidney and ureter, unspecified: N28.9

## 2016-12-10 LAB — SURGICAL PCR SCREEN
MRSA, PCR: POSITIVE — AB
STAPHYLOCOCCUS AUREUS: POSITIVE — AB

## 2016-12-10 MED ORDER — CHLORHEXIDINE GLUCONATE CLOTH 2 % EX PADS
6.0000 | MEDICATED_PAD | Freq: Once | CUTANEOUS | Status: DC
  Filled 2016-12-10: qty 6

## 2016-12-10 NOTE — Patient Instructions (Addendum)
Your procedure is scheduled on: 12/28/16 Mon For Dr Pabon/   12/16/16 Wed for Dr Mortimer Fries Report to Same Day Surgery 2nd floor medical mall [facility] elevator on left to 2nd floor.  Check in with surgery information desk.) To find out your arrival time please call [phone] between 1PM - 3PM on 12/25/16 Fri for Dr Pabon/ 12/15/16 Tues for Dr Mortimer Fries  Remember: Instructions that are not followed completely may result in serious medical risk, up to and including death, or upon the discretion of your surgeon and anesthesiologist your surgery may need to be rescheduled.    _x___ 1. Do not eat food after midnight the night before your procedure. You may drink clear liquids up to 2 hours before you are scheduled to arrive at the hospital for your procedure.  Do not drink clear liquids within 2 hours of your scheduled arrival to the hospital.  Clear liquids include  --Water or Apple juice without pulp  --Clear carbohydrate beverage such as ClearFast or Gatorade  --Black Coffee or Clear Tea (No milk, no creamers, do not add anything to                  the coffee or Tea Type 1 and type 2 diabetics should only drink water.  No gum chewing or hard candies.     __x__ 2. No Alcohol for 24 hours before or after surgery.   __x__3. No Smoking for 24 prior to surgery.   ____  4. Bring all medications with you on the day of surgery if instructed.    __x__ 5. Notify your doctor if there is any change in your medical condition     (cold, fever, infections).     Do not wear jewelry, make-up, hairpins, clips or nail polish.  Do not wear lotions, powders, or perfumes. You may wear deodorant.  Do not shave 48 hours prior to surgery. Men may shave face and neck.  Do not bring valuables to the hospital.    [facility] is not responsible for any belongings or valuables.               Contacts, dentures or bridgework may not be worn into surgery.  Leave your suitcase in the car. After surgery it  may be brought to your room.  For patients admitted to the hospital, discharge time is determined by your                       treatment team.   Patients discharged the day of surgery will not be allowed to drive home.  You will need someone to drive you home and stay with you the night of your procedure.    Please read over the following fact sheets that you were given:   [facility] Preparing for Surgery and or MRSA Information   _x___ Take anti-hypertensive listed below, cardiac, seizure, asthma,     anti-reflux and psychiatric medicines. These include:  1.   2.  3.  4.  5.  6.  ____Fleets enema or Magnesium Citrate as directed.   _x___ Use CHG Soap or sage wipes as directed on instruction sheet   ____ Use inhalers on the day of surgery and bring to hospital day of surgery  __x__ Stop Metformin and Janumet 2 days prior to surgery.    ____ Take 1/2 of usual insulin dose the night before surgery and none on the morning     surgery.  _x___ Follow recommendations from Cardiologist, Pulmonologist or PCP regarding          stopping Aspirin, Coumadin, Plavix ,Eliquis, Effient, or Pradaxa, and Pletal.  X____Stop Anti-inflammatories such as Advil, Aleve, Ibuprofen, Motrin, Naproxen, Naprosyn, Goodies powders or aspirin products. OK to take Tylenol and                          Celebrex.   _x___ Stop supplements until after surgery.  But may continue Vitamin D, Vitamin B,       and multivitamin.   Stop fish oil 1 week before surgery  ____ Bring C-Pap to the hospital.

## 2016-12-10 NOTE — Telephone Encounter (Signed)
Edd Fabian, RN from [facility] called stating the anesthesiologist is requesting medical clearance from patient's primary care provider. I told her that I would fax one and as soon as I get a reply,then I woould fax it to them. She greed.   Medical Clearance will be faxed to his PCP.

## 2016-12-10 NOTE — Pre-Procedure Instructions (Signed)
Anesthesia, Dr Amie Critchley, called with medical history. Medical clearance requested.

## 2016-12-10 NOTE — Pre-Procedure Instructions (Signed)
Request for medical clearance faxed to Dr. Dahlia Byes office and called (spoke to [place] ).

## 2016-12-11 ENCOUNTER — Telehealth: Payer: Self-pay

## 2016-12-11 MED ORDER — MUPIROCIN CALCIUM 2 % NA OINT: 1.0000 "application " | TOPICAL_OINTMENT | Freq: Two times a day (BID) | NASAL | 0 refills | Status: DC

## 2016-12-11 NOTE — Telephone Encounter (Signed)
Called patient to let him know that he is positive for MRSA, therefore, he is to start using Bactroban BID for 5 days. Patient understood and had no further questions.

## 2016-12-14 ENCOUNTER — Telehealth: Payer: Self-pay | Admitting: General Practice

## 2016-12-14 ENCOUNTER — Ambulatory Visit (INDEPENDENT_AMBULATORY_CARE_PROVIDER_SITE_OTHER): Payer: Medicare Other | Admitting: Family Medicine

## 2016-12-14 ENCOUNTER — Telehealth: Payer: Self-pay | Admitting: Internal Medicine

## 2016-12-14 ENCOUNTER — Telehealth: Payer: Self-pay

## 2016-12-14 ENCOUNTER — Encounter: Payer: Self-pay | Admitting: Family Medicine

## 2016-12-14 VITALS — BP 122/62 | HR 96 | Temp 97.7°F | Resp 16 | Ht 73.0 in | Wt 280.9 lb

## 2016-12-14 DIAGNOSIS — Z22322 Carrier or suspected carrier of Methicillin resistant Staphylococcus aureus: Secondary | ICD-10-CM

## 2016-12-14 DIAGNOSIS — C184 Malignant neoplasm of transverse colon: Secondary | ICD-10-CM | POA: Diagnosis not present

## 2016-12-14 DIAGNOSIS — I2510 Atherosclerotic heart disease of native coronary artery without angina pectoris: Secondary | ICD-10-CM | POA: Diagnosis not present

## 2016-12-14 DIAGNOSIS — R918 Other nonspecific abnormal finding of lung field: Secondary | ICD-10-CM | POA: Diagnosis not present

## 2016-12-14 NOTE — Telephone Encounter (Signed)
Donald Mcdowell is calling about the patient, patients is confused on which antibiotics he was suppose to be taken, and also had a  Couple more questions about him medications.please call and advice.

## 2016-12-14 NOTE — Telephone Encounter (Signed)
Neighbor is calling stating that Dr. Dahlia Byes called in [place] for the pt to use 5 days before Pabon's procedure because he came back positive for MRSA in the nares. I informed DS on Friday which he stated he was not concerned with ordering Bactroban for the bronch. She states pt went to his PCP this am for clearance per pre-assessment and they informed him that the MRSA can go into his airway during the bronch. Do you want pt to go ahead and use the bactroban or wait until 5 days prior to Pabon's procedure?

## 2016-12-14 NOTE — Telephone Encounter (Signed)
Dr Delight Ovens office is calling needing a Clearence form to be sent over for Bronc for patient on 12/16/16  Pt is there in office and they can fill it out while he is there with them  Please advise.

## 2016-12-14 NOTE — Telephone Encounter (Signed)
Form was faxed on Friday and I just refaxed the form. Nothing further needed.

## 2016-12-14 NOTE — Telephone Encounter (Signed)
Go ahead with treatment

## 2016-12-14 NOTE — Pre-Procedure Instructions (Signed)
Called Dr Zoila Shutter office regarding clearance.  "We have yet to receive the clearance; we will send to you when possible."

## 2016-12-14 NOTE — Telephone Encounter (Signed)
Fax number is [phone]

## 2016-12-14 NOTE — Patient Instructions (Signed)
I will talk to Dr. Dahlia Byes and Dr. Mortimer Fries today about your concerns, your wishes, the MRSA, etc. We'll come up with a plan with you

## 2016-12-14 NOTE — Progress Notes (Signed)
BP 122/62 (BP Location: Left Arm, Patient Position: Sitting, Cuff Size: Normal)   Pulse 96   Temp 97.7 F (36.5 C) (Oral)   Resp 16   Ht 6\' 1"  (1.854 m)   Wt 280 lb 14.4 oz (127.4 kg)   SpO2 98%   BMI 37.06 kg/m    Subjective:    Patient ID: Donald Pou., male    DOB: March 31, 1945, 71 y.o.   MRN: [id number]  HPI: Donald Andel. is a 71 y.o. male  Chief Complaint  Patient presents with  . surgical clearance    HPI Patient is here for surgical clearance He has colon cancer; patient says it is in his transverse colon He did not really want to have surgery but he is doing it for his wife, neighbor's children who are like children to them He has to pay $600 for cancer testing for genetic tests Surgeon wants to take out 2/3 of his colon and hook up his small intestine Patient is not afraid of death Dr. Mortimer Fries is not going to this procedure on Wednesday until I get this paper to them He has had 3 CT scans since June 28th A CT abd/pelv in June, another in August, and a chest CT in September We reviewed the findings, which included left main and 3 vessel coronary artery calcification, suggestive of atherosclerosis Patient does not want to see a cadrdiologist We discussed his left main disease and 3 vessel disease in detail; patient refuses stress test Voiced his frustration with the whole process Says he didn't want to have anything done in the first place, not even surgery, but having that now on Oct 15th 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 Patient wants to go ahead with the surgery, but without any cardiac testing; he do not want a stress test even if it means that he has a heart attack on the table He is going to have the procedure, bronchoscopy, on Wednesday; needs my clearance for this; he isn't sure he wants to have it done after all; he says the surgeon told him that could wait, that the most important thing was the colon cancer  Depression screen [facility] 2/9 12/14/2016 10/13/2016  08/28/2016 04/28/2016 12/23/2015  Decreased Interest 0 0 0 0 0  Down, Depressed, Hopeless 0 0 0 0 0  PHQ - 2 Score 0 0 0 0 0    Relevant past medical, surgical, family and social history reviewed Past Medical History:  Diagnosis Date  . Abdominal aortic atherosclerosis ([place]) 09/15/2016   CT scan June 2018  . Allergy   . Cancer ([place])   . Coronary artery calcification seen on CAT scan 09/15/2016   Previous smoker; noted on CT scan June 2018  . Diabetes mellitus without complication ([place])   . GERD (gastroesophageal reflux disease)   . Gout   . Hyperlipidemia   . Hypertension   . Kidney function abnormal    stage 2  . Kidney stones   . Obesity 08/20/2015   Past Surgical History:  Procedure Laterality Date  . COLONOSCOPY WITH PROPOFOL N/A 11/05/2016   Procedure: COLONOSCOPY WITH PROPOFOL;  Surgeon: Lucilla Lame, MD;  Location: [facility];  Service: Gastroenterology;  Laterality: N/A;  . ESOPHAGOGASTRODUODENOSCOPY (EGD) WITH PROPOFOL N/A 11/05/2016   Procedure: ESOPHAGOGASTRODUODENOSCOPY (EGD) WITH PROPOFOL;  Surgeon: Lucilla Lame, MD;  Location: [facility];  Service: Gastroenterology;  Laterality: N/A;  Diabetic - oral meds  . kidney stones    . POLYPECTOMY N/A 11/05/2016  Procedure: POLYPECTOMY;  Surgeon: Lucilla Lame, MD;  Location: [facility];  Service: Gastroenterology;  Laterality: N/A;   Family History  Problem Relation Age of Onset  . Alzheimer's disease Mother   . Heart disease Father   . Diabetes Father   . Cancer Father        Prostate  . Cancer Sister        skin cancer   Social History   Social History  . Marital status: Married    Spouse name: N/A  . Number of children: N/A  . Years of education: N/A   Occupational History  . Not on file.   Social History Main Topics  . Smoking status: Former Smoker    Types: Cigarettes    Quit date: 06/2016  . Smokeless tobacco: Never Used  . Alcohol use No  . Drug use: No  . Sexual activity: Not  Currently   Other Topics Concern  . Not on file   Social History Narrative  . No narrative on file    Interim medical history since last visit reviewed. Allergies and medications reviewed  Review of Systems  Constitutional: Negative for fever and unexpected weight change.  HENT: Negative for mouth sores.   Respiratory: Positive for cough (sputum is clear; no blood) and shortness of breath (not labored breathing; can walk a mile without stopping; SHOB when coming out of shower). Negative for chest tightness.   Cardiovascular: Negative for chest pain, palpitations and leg swelling.  Gastrointestinal: Negative for abdominal pain and blood in stool (not since colonoscopy).  Genitourinary: Negative for dysuria.  Skin:       No boils, no MRSA history per patient's understanding, but test result is positive from 12/10/16  Neurological: Negative for seizures and headaches.  Hematological: Bruises/bleeds easily (bruising easily).  Psychiatric/Behavioral: Negative for dysphoric mood.       I'm not depressed, sometimes doesn't like talking about the cancer and dying   Per HPI unless specifically indicated above     Objective:    BP 122/62 (BP Location: Left Arm, Patient Position: Sitting, Cuff Size: Normal)   Pulse 96   Temp 97.7 F (36.5 C) (Oral)   Resp 16   Ht 6\' 1"  (1.854 m)   Wt 280 lb 14.4 oz (127.4 kg)   SpO2 98%   BMI 37.06 kg/m   Wt Readings from Last 3 Encounters:  12/15/16 283 lb (128.4 kg)  12/14/16 280 lb 14.4 oz (127.4 kg)  12/10/16 279 lb (126.6 kg)    Physical Exam  Constitutional: He appears well-developed and well-nourished. No distress.  Obese, weight overall stable  HENT:  Head: Normocephalic and atraumatic.  Eyes: EOM are normal. No scleral icterus.  Neck: No thyromegaly present.  Cardiovascular: Normal rate and regular rhythm.   Pulmonary/Chest: Effort normal and breath sounds normal.  Abdominal: Soft. Bowel sounds are normal. He exhibits no  distension. There is no tenderness. There is no guarding.  Musculoskeletal: He exhibits no edema.  Neurological: Coordination normal.  Skin: Skin is warm and dry. He is not diaphoretic.  Nailbeds are pale  Psychiatric: He has a normal mood and affect. His behavior is normal. Judgment and thought content normal.    Results for orders placed or performed during the hospital encounter of 12/10/16  Surgical pcr screen  Result Value Ref Range   MRSA, PCR POSITIVE (A) NEGATIVE   Staphylococcus aureus POSITIVE (A) NEGATIVE      Assessment & Plan:   Problem List Items Addressed  This Visit      Cardiovascular and Mediastinum   Coronary artery calcification seen on CAT scan    Patient did not want cardiology appointment; I acknowledge patient's wishes and have spoken to the cardiologist about his desire to not have any cardiac testing or work-up        Respiratory   Endobronchial mass    Patient is not sure he wants the bronchoscopy; I reached out to Dr. Mortimer Fries        Digestive   Cancer of transverse colon [facility]) - Primary    Patient and I and his wife had a long discussion about his cancer, his feelings about going through all of these tests       Other Visit Diagnoses    MRSA (methicillin resistant staph aureus) culture positive       addressed by hospital staff, was told that he needs to start that October 10th, not right away; however, he has his bronchscopy on Wednesday; med too $$$       Follow up plan: No Follow-up on file.  An after-visit summary was printed and given to the patient at [facility].  Please see the patient instructions which may contain other information and recommendations beyond what is mentioned above in the assessment and plan.  No orders of the defined types were placed in this encounter.   No orders of the defined types were placed in this encounter.

## 2016-12-14 NOTE — Pre-Procedure Instructions (Signed)
NOTIFIED BY ANGIE AT DR PABON'S THAT PCP WOULD NOT CLEARANCE AND PATIENT NEEDS TO HAVE CARDIAC CLEARANCE. PATIENT REFUSING. DISCUSSED WITH DR J ADAMS AND AESTHESIA WILL NOT PUT TO SLEEP UNLESS CARDIAC CLEARANCE OBTAINED. NOTIFIED ANGIE AT DR [place].

## 2016-12-14 NOTE — Telephone Encounter (Signed)
Received notification from [place] at [facility] stating that secondary to patient's insurance, they are unable to provide care to patient. If patient does indeed decide upon having clearance and surgery completed; he will need to be evaluated inpatient for [place].

## 2016-12-14 NOTE — Telephone Encounter (Signed)
Spoke with Caryl Pina in PAT and she states that per Dr. Sanda Klein pt needs to have cardiac clearance before any procedure can be done. I spoke with [name] on [facility] and informed her this is per Dr. Sanda Klein calling Dr. Corlis Leak office and stating she will not clear pt without cardiac clearance. Spoke with [name] who states pt has agreed now to have cardiac clearance. Cardiology will call to schedule appt. Nothing further needed at this time.

## 2016-12-14 NOTE — Telephone Encounter (Signed)
Pulmonology patient. Routed to pulmonology

## 2016-12-14 NOTE — Telephone Encounter (Signed)
Called Ivin Booty back to answer her questions about patient's Bactroban. She understood and had no further questions.

## 2016-12-14 NOTE — Assessment & Plan Note (Signed)
Patient and I and his wife had a long discussion about his cancer, his feelings about going through all of these tests

## 2016-12-14 NOTE — Telephone Encounter (Signed)
Pt neighyor, Donald Mcdowell, is on DPR, would like to ask some questions regarding pt medications for procedure on Wednesday 10/3

## 2016-12-14 NOTE — Telephone Encounter (Signed)
Anesthesia called from [facility] wanting to give Donald Mcdowell a heads up Stating patient needs Cardiac clearance  But patient refuses to do so. He stated to them he would not want to do anymore tests  Please advise.

## 2016-12-15 ENCOUNTER — Ambulatory Visit (INDEPENDENT_AMBULATORY_CARE_PROVIDER_SITE_OTHER): Payer: Medicare Other | Admitting: Cardiovascular Disease

## 2016-12-15 ENCOUNTER — Other Ambulatory Visit: Payer: Self-pay

## 2016-12-15 ENCOUNTER — Ambulatory Visit (INDEPENDENT_AMBULATORY_CARE_PROVIDER_SITE_OTHER): Payer: Medicare Other

## 2016-12-15 ENCOUNTER — Encounter: Payer: Self-pay | Admitting: Cardiovascular Disease

## 2016-12-15 VITALS — BP 154/52 | HR 76 | Ht 71.0 in | Wt 283.0 lb

## 2016-12-15 DIAGNOSIS — R011 Cardiac murmur, unspecified: Secondary | ICD-10-CM

## 2016-12-15 DIAGNOSIS — Z7689 Persons encountering health services in other specified circumstances: Secondary | ICD-10-CM | POA: Diagnosis not present

## 2016-12-15 DIAGNOSIS — Z01818 Encounter for other preprocedural examination: Secondary | ICD-10-CM

## 2016-12-15 DIAGNOSIS — E785 Hyperlipidemia, unspecified: Secondary | ICD-10-CM | POA: Diagnosis not present

## 2016-12-15 LAB — ECHOCARDIOGRAM COMPLETE
HEIGHTINCHES: 71 in
Weight: 4528 oz

## 2016-12-15 NOTE — Assessment & Plan Note (Signed)
Patient did not want cardiology appointment; I acknowledge patient's wishes and have spoken to the cardiologist about his desire to not have any cardiac testing or work-up

## 2016-12-15 NOTE — Assessment & Plan Note (Signed)
Patient is not sure he wants the bronchoscopy; I reached out to Dr. Mortimer Fries

## 2016-12-15 NOTE — Telephone Encounter (Signed)
Dr. Sanda Klein called wanting to speak with Dr. Dahlia Byes and discuss the reason she did not give medical clearance for the patient to have surgery. She wants the patient to see a cardiologist and patient is declining. Not sure what will happen. Waiting on patient to decide on what to do.

## 2016-12-15 NOTE — Progress Notes (Signed)
Cardiology Office Note   Date:  12/15/2016   ID:  Donald Pou., DOB [birth year], MRN [id number]  PCP:  Flora Lipps, MD  Cardiologist:   Kathlyn Sacramento, MD   Chief Complaint  Patient presents with  . other    New patient. surgerical clearance. Meds reviewed verbally with patient.       History of Present Illness: Donald Borchers. is a 71 y.o. male who Was referred by Dr. Sanda Klein for preoperative cardiovascular evaluation before undergoing colectomy and bronchoscopy. He was recently diagnosed with colon cancer. The patient has no prior cardiac history. He has chronic medical conditions that include prolonged history of diabetes mellitus, hypertension, hyperlipidemia, morbid obesity and previous tobacco use. He has no family history of premature coronary artery disease although his father did have myocardial infarction in his 44s. The patient was recently diagnosed with colon cancer and also was diagnosed with lung nodule. He is supposed to have bronchoscopy done tomorrow and colectomy done in the middle of the month. The patient denies any chest pain or shortness of breath. He is able to walk 1 mile daily for exercise. He does that and about 25 minutes. He does not feel limited with doing that. He is not aware of a cardiac murmur.     Past Medical History:  Diagnosis Date  . Abdominal aortic atherosclerosis ([place]) 09/15/2016   CT scan June 2018  . Allergy   . Cancer ([place])   . Coronary artery calcification seen on CAT scan 09/15/2016   Previous smoker; noted on CT scan June 2018  . Diabetes mellitus without complication ([place])   . GERD (gastroesophageal reflux disease)   . Gout   . Hyperlipidemia   . Hypertension   . Kidney function abnormal    stage 2  . Kidney stones   . Obesity 08/20/2015    Past Surgical History:  Procedure Laterality Date  . COLONOSCOPY WITH PROPOFOL N/A 11/05/2016   Procedure: COLONOSCOPY WITH PROPOFOL;  Surgeon: Donald Lame, MD;  Location:  [facility];  Service: Gastroenterology;  Laterality: N/A;  . ESOPHAGOGASTRODUODENOSCOPY (EGD) WITH PROPOFOL N/A 11/05/2016   Procedure: ESOPHAGOGASTRODUODENOSCOPY (EGD) WITH PROPOFOL;  Surgeon: Donald Lame, MD;  Location: [facility];  Service: Gastroenterology;  Laterality: N/A;  Diabetic - oral meds  . kidney stones    . POLYPECTOMY N/A 11/05/2016   Procedure: POLYPECTOMY;  Surgeon: Donald Lame, MD;  Location: [facility];  Service: Gastroenterology;  Laterality: N/A;     Current Outpatient Prescriptions  Medication Sig Dispense Refill  . allopurinol (ZYLOPRIM) 300 MG tablet Take 1 tablet (300 mg total) by mouth daily. 90 tablet 3  . erythromycin base (E-MYCIN) 500 MG tablet Please see bowel prep instructions 6 tablet 0  . glipiZIDE (GLUCOTROL XL) 10 MG 24 hr tablet Take 1 tablet (10 mg total) by mouth 2 (two) times daily. 180 tablet 1  . glucose blood (ACCU-CHEK COMPACT STRIPS) test strip 100 each by Other route as needed for other. Use as instructed, check FSBS once a day; LON 99 months, E11.9 100 each 1  . Iron-Vitamin C (IRON 100/C PO) Take 65 mg by mouth daily after breakfast. 2 HOURS  AFTER BREAKFAST    . Magnesium Oxide 250 MG TABS Take 250 mg by mouth daily.    . metFORMIN (GLUCOPHAGE) 1000 MG tablet Take 0.5 tablets (500 mg total) by mouth 2 (two) times daily. (Patient taking differently: Take 500 mg by mouth 2 (two) times daily. TAKES  0.5 TABLET) 90 tablet 0  . mupirocin nasal ointment (BACTROBAN NASAL) 2 % Place 1 application into the nose 2 (two) times daily. Use one-half of tube in each nostril twice daily for five (5) days. After application, press sides of nose together and gently massage. 10 g 0  . neomycin (MYCIFRADIN) 500 MG tablet Please see bowel prep instructions 6 tablet 0  . Omega-3 Fatty Acids (FISH OIL) 1200 MG CAPS Take 1,200 mg by mouth 2 (two) times daily.     . polyethylene glycol powder (GLYCOLAX/MIRALAX) powder Please see bowel prep  instructions 255 g 0  . quinapril (ACCUPRIL) 5 MG tablet Take 1 tablet (5 mg total) by mouth daily. 30 tablet 5  . rosuvastatin (CRESTOR) 20 MG tablet Take 0.5 tablets (10 mg total) by mouth daily. (Patient taking differently: Take 10 mg by mouth at bedtime. TAKES 0.5 TABLET) 45 tablet 1  . tamsulosin (FLOMAX) 0.4 MG CAPS capsule Take 0.4 mg by mouth daily.    . vitamin B-12 (CYANOCOBALAMIN) 100 MCG tablet Take 100 mcg by mouth daily.     No current facility-administered medications for this visit.     Allergies:   Patient has no known allergies.    Social History:  The patient  reports that he quit smoking about 6 months ago. His smoking use included Cigarettes. He has never used smokeless tobacco. He reports that he does not drink alcohol or use drugs.   Family History:  The patient's family history includes Alzheimer's disease in his mother; Cancer in his father and sister; Diabetes in his father; Heart disease in his father.    ROS:  Please see the history of present illness.   Otherwise, review of systems are positive for none.   All other systems are reviewed and negative.    PHYSICAL EXAM: VS:  BP (!) 154/52 (BP Location: Left Arm, Patient Position: Sitting, Cuff Size: Normal)   Ht 5\' 11"  (1.803 m)   Wt 283 lb (128.4 kg)   BMI 39.47 kg/m  , BMI Body mass index is 39.47 kg/m. GEN: Well nourished, well developed, in no acute distress  HEENT: normal  Neck: no JVD, carotid bruits, or masses Cardiac: RRR; no  rubs, or gallops,  trace edema . 2/6 crescendo decrescendo systolic murmur in the aortic area which is mid peaking  Respiratory:  clear to auscultation bilaterally, normal work of breathing GI: soft, nontender, nondistended, + BS MS: no deformity or atrophy  Skin: warm and dry, no rash Neuro:  Strength and sensation are intact Psych: euthymic mood, full affect   EKG:  EKG is ordered today. The ekg ordered today demonstrates normal sinus rhythm with first-degree AV  block. No significant ST or T wave changes.  Recent Labs: 04/28/2016: TSH 2.54 11/09/2016: ALT 26; BUN 27; Creatinine, Ser 1.25; Potassium 4.1; Sodium 140 11/18/2016: Hemoglobin 9.6; Platelets 280    Lipid Panel    Component Value Date/Time   CHOL 94 08/28/2016 0835   CHOL 132 12/25/2014 0803   TRIG 104 08/28/2016 0835   HDL 38 (L) 08/28/2016 0835   HDL 42 12/25/2014 0803   CHOLHDL 2.5 08/28/2016 0835   VLDL 21 08/28/2016 0835   LDLCALC 35 08/28/2016 0835   LDLCALC 67 12/25/2014 0803      Wt Readings from Last 3 Encounters:  12/15/16 283 lb (128.4 kg)  12/14/16 280 lb 14.4 oz (127.4 kg)  12/10/16 279 lb (126.6 kg)        No flowsheet data found.  ASSESSMENT AND PLAN:  1.  Preoperative cardiovascular evaluation: The patient has no prior cardiac history, no symptoms of angina and his baseline EKG does not show any signs of ischemia. He is able to walk 1 mile daily for exercise without significant anginal symptoms. In terms of bronchoscopy, he can proceed without any further cardiovascular testing at an acceptable risk overall.  However, for the bigger surgery which is colectomy, I want him to have an echocardiogram to evaluate his cardiac murmur. It is suggestive of aortic stenosis but that does not seem to be severe by physical exam.  2. Coronary atherosclerosis: This was noted on previous CT scan. This establishes the diagnosis of atherosclerosis. However, the patient has no anginal symptoms and EKG does not show ischemic changes. I recommend treatment of risk factors and low-dose aspirin 81 mg once daily once he is done with surgeries. I don't think stress testing is going to change his management at the present time.  3. Hyperlipidemia: Continue treatment with rosuvastatin with a target LDL of less than 70.   Disposition:   FU with me as needed.   Signed,  Kathlyn Sacramento, MD  12/15/2016 1:46 PM    [facility]

## 2016-12-15 NOTE — Patient Instructions (Addendum)
Medication Instructions:  Your physician recommends that you continue on your current medications as directed. Please refer to the Current Medication list given to you today.   Labwork: none  Testing/Procedures: Your physician has requested that you have an echocardiogram. Echocardiography is a painless test that uses sound waves to create images of your heart. It provides your doctor with information about the size and shape of your heart and how well your heart's chambers and valves are working. This procedure takes approximately one hour. There are no restrictions for this procedure.    Follow-Up: Your physician recommends that you schedule a follow-up appointment as needed.    Any Other Special Instructions Will Be Listed Below (If Applicable).     If you need a refill on your cardiac medications before your next appointment, please call your pharmacy.  Echocardiogram An echocardiogram, or echocardiography, uses sound waves (ultrasound) to produce an image of your heart. The echocardiogram is simple, painless, obtained within a short period of time, and offers valuable information to your health care provider. The images from an echocardiogram can provide information such as:  Evidence of coronary artery disease (CAD).  Heart size.  Heart muscle function.  Heart valve function.  Aneurysm detection.  Evidence of a past heart attack.  Fluid buildup around the heart.  Heart muscle thickening.  Assess heart valve function.  Tell a health care provider about:  Any allergies you have.  All medicines you are taking, including vitamins, herbs, eye drops, creams, and over-the-counter medicines.  Any problems you or family members have had with anesthetic medicines.  Any blood disorders you have.  Any surgeries you have had.  Any medical conditions you have.  Whether you are pregnant or may be pregnant. What happens before the procedure? No special preparation is  needed. Eat and drink normally. What happens during the procedure?  In order to produce an image of your heart, gel will be applied to your chest and a wand-like tool (transducer) will be moved over your chest. The gel will help transmit the sound waves from the transducer. The sound waves will harmlessly bounce off your heart to allow the heart images to be captured in real-time motion. These images will then be recorded.  You may need an IV to receive a medicine that improves the quality of the pictures. What happens after the procedure? You may return to your normal schedule including diet, activities, and medicines, unless your health care provider tells you otherwise. This information is not intended to replace advice given to you by your health care provider. Make sure you discuss any questions you have with your health care provider. Document Released: 02/28/2000 Document Revised: 10/19/2015 Document Reviewed: 11/07/2012 Elsevier Interactive Patient Education  2017 [employer].

## 2016-12-16 ENCOUNTER — Ambulatory Visit: Payer: Medicare Other | Admitting: Anesthesiology

## 2016-12-16 ENCOUNTER — Other Ambulatory Visit: Payer: Self-pay

## 2016-12-16 ENCOUNTER — Ambulatory Visit
Admission: RE | Admit: 2016-12-16 | Discharge: 2016-12-16 | Disposition: A | Discharge status: Home / Self Care | Payer: Medicare Other | Source: Ambulatory Visit | Attending: Internal Medicine | Admitting: Internal Medicine

## 2016-12-16 ENCOUNTER — Telehealth: Payer: Self-pay | Admitting: Internal Medicine

## 2016-12-16 ENCOUNTER — Encounter
Admission: RE | Disposition: A | Discharge status: Home / Self Care | Payer: Self-pay | Source: Ambulatory Visit | Attending: Internal Medicine

## 2016-12-16 ENCOUNTER — Ambulatory Visit: Payer: Medicare Other

## 2016-12-16 DIAGNOSIS — Z8042 Family history of malignant neoplasm of prostate: Secondary | ICD-10-CM | POA: Insufficient documentation

## 2016-12-16 DIAGNOSIS — I129 Hypertensive chronic kidney disease with stage 1 through stage 4 chronic kidney disease, or unspecified chronic kidney disease: Secondary | ICD-10-CM | POA: Diagnosis not present

## 2016-12-16 DIAGNOSIS — Z7984 Long term (current) use of oral hypoglycemic drugs: Secondary | ICD-10-CM | POA: Insufficient documentation

## 2016-12-16 DIAGNOSIS — C189 Malignant neoplasm of colon, unspecified: Secondary | ICD-10-CM | POA: Diagnosis not present

## 2016-12-16 DIAGNOSIS — Z87442 Personal history of urinary calculi: Secondary | ICD-10-CM | POA: Insufficient documentation

## 2016-12-16 DIAGNOSIS — I10 Essential (primary) hypertension: Secondary | ICD-10-CM | POA: Diagnosis not present

## 2016-12-16 DIAGNOSIS — Z820 Family history of epilepsy and other diseases of the nervous system: Secondary | ICD-10-CM | POA: Diagnosis not present

## 2016-12-16 DIAGNOSIS — E785 Hyperlipidemia, unspecified: Secondary | ICD-10-CM | POA: Diagnosis not present

## 2016-12-16 DIAGNOSIS — Z87891 Personal history of nicotine dependence: Secondary | ICD-10-CM | POA: Diagnosis not present

## 2016-12-16 DIAGNOSIS — R911 Solitary pulmonary nodule: Secondary | ICD-10-CM

## 2016-12-16 DIAGNOSIS — I2510 Atherosclerotic heart disease of native coronary artery without angina pectoris: Secondary | ICD-10-CM | POA: Insufficient documentation

## 2016-12-16 DIAGNOSIS — Z808 Family history of malignant neoplasm of other organs or systems: Secondary | ICD-10-CM | POA: Diagnosis not present

## 2016-12-16 DIAGNOSIS — Z79899 Other long term (current) drug therapy: Secondary | ICD-10-CM | POA: Insufficient documentation

## 2016-12-16 DIAGNOSIS — E119 Type 2 diabetes mellitus without complications: Secondary | ICD-10-CM | POA: Insufficient documentation

## 2016-12-16 DIAGNOSIS — E669 Obesity, unspecified: Secondary | ICD-10-CM | POA: Diagnosis not present

## 2016-12-16 DIAGNOSIS — Z8249 Family history of ischemic heart disease and other diseases of the circulatory system: Secondary | ICD-10-CM | POA: Diagnosis not present

## 2016-12-16 DIAGNOSIS — I739 Peripheral vascular disease, unspecified: Secondary | ICD-10-CM | POA: Diagnosis not present

## 2016-12-16 DIAGNOSIS — C3412 Malignant neoplasm of upper lobe, left bronchus or lung: Secondary | ICD-10-CM | POA: Insufficient documentation

## 2016-12-16 DIAGNOSIS — Z833 Family history of diabetes mellitus: Secondary | ICD-10-CM | POA: Insufficient documentation

## 2016-12-16 DIAGNOSIS — Z6839 Body mass index (BMI) 39.0-39.9, adult: Secondary | ICD-10-CM | POA: Diagnosis not present

## 2016-12-16 HISTORY — PX: ELECTROMAGNETIC NAVIGATION BROCHOSCOPY: SHX5369

## 2016-12-16 LAB — GLUCOSE, CAPILLARY: Glucose-Capillary: 141 mg/dL — ABNORMAL HIGH (ref 65–99)

## 2016-12-16 SURGERY — ELECTROMAGNETIC NAVIGATION BRONCHOSCOPY
Anesthesia: General

## 2016-12-16 MED ORDER — SODIUM CHLORIDE 0.9 % IV SOLN
INTRAVENOUS | Status: DC
  Administered 2016-12-16: 13:00:00 via INTRAVENOUS

## 2016-12-16 MED ORDER — FENTANYL CITRATE (PF) 100 MCG/2ML IJ SOLN
INTRAMUSCULAR | Status: DC | PRN
  Administered 2016-12-16: 50 ug via INTRAVENOUS

## 2016-12-16 MED ORDER — SUGAMMADEX SODIUM 200 MG/2ML IV SOLN
INTRAVENOUS | Status: DC | PRN
  Administered 2016-12-16: 250 mg via INTRAVENOUS

## 2016-12-16 MED ORDER — ONDANSETRON HCL 4 MG/2ML IJ SOLN
INTRAMUSCULAR | Status: AC
  Filled 2016-12-16: qty 2

## 2016-12-16 MED ORDER — SUCCINYLCHOLINE CHLORIDE 20 MG/ML IJ SOLN
INTRAMUSCULAR | Status: AC
  Filled 2016-12-16: qty 1

## 2016-12-16 MED ORDER — ONDANSETRON HCL 4 MG/2ML IJ SOLN: 4.0000 mg | Freq: Once | INTRAMUSCULAR | Status: DC | PRN

## 2016-12-16 MED ORDER — DEXAMETHASONE SODIUM PHOSPHATE 10 MG/ML IJ SOLN
INTRAMUSCULAR | Status: AC
  Filled 2016-12-16: qty 1

## 2016-12-16 MED ORDER — ROCURONIUM BROMIDE 100 MG/10ML IV SOLN
INTRAVENOUS | Status: DC | PRN
  Administered 2016-12-16: 25 mg via INTRAVENOUS
  Administered 2016-12-16: 5 mg via INTRAVENOUS

## 2016-12-16 MED ORDER — SUGAMMADEX SODIUM 500 MG/5ML IV SOLN
INTRAVENOUS | Status: AC
  Filled 2016-12-16: qty 5

## 2016-12-16 MED ORDER — LIDOCAINE HCL (CARDIAC) 20 MG/ML IV SOLN
INTRAVENOUS | Status: DC | PRN
  Administered 2016-12-16: 60 mg via INTRAVENOUS

## 2016-12-16 MED ORDER — PROPOFOL 10 MG/ML IV BOLUS
INTRAVENOUS | Status: DC | PRN
  Administered 2016-12-16: 180 mg via INTRAVENOUS

## 2016-12-16 MED ORDER — ROCURONIUM BROMIDE 50 MG/5ML IV SOLN
INTRAVENOUS | Status: AC
  Filled 2016-12-16: qty 1

## 2016-12-16 MED ORDER — FENTANYL CITRATE (PF) 100 MCG/2ML IJ SOLN: 25.0000 ug | INTRAMUSCULAR | Status: DC | PRN

## 2016-12-16 MED ORDER — DEXAMETHASONE SODIUM PHOSPHATE 10 MG/ML IJ SOLN
INTRAMUSCULAR | Status: DC | PRN
  Administered 2016-12-16: 5 mg via INTRAVENOUS

## 2016-12-16 MED ORDER — PROPOFOL 10 MG/ML IV BOLUS
INTRAVENOUS | Status: AC
  Filled 2016-12-16: qty 20

## 2016-12-16 MED ORDER — MIDAZOLAM HCL 2 MG/2ML IJ SOLN
INTRAMUSCULAR | Status: AC
  Filled 2016-12-16: qty 2

## 2016-12-16 MED ORDER — FAMOTIDINE 20 MG PO TABS
ORAL_TABLET | ORAL | Status: AC
  Administered 2016-12-16: 20 mg via ORAL
  Filled 2016-12-16: qty 1

## 2016-12-16 MED ORDER — FENTANYL CITRATE (PF) 100 MCG/2ML IJ SOLN
INTRAMUSCULAR | Status: AC
  Filled 2016-12-16: qty 2

## 2016-12-16 MED ORDER — ONDANSETRON HCL 4 MG/2ML IJ SOLN
INTRAMUSCULAR | Status: DC | PRN
  Administered 2016-12-16: 4 mg via INTRAVENOUS

## 2016-12-16 MED ORDER — MIDAZOLAM HCL 5 MG/5ML IJ SOLN
INTRAMUSCULAR | Status: DC | PRN
  Administered 2016-12-16 (×2): 1 mg via INTRAVENOUS

## 2016-12-16 MED ORDER — FAMOTIDINE 20 MG PO TABS
20.0000 mg | ORAL_TABLET | Freq: Once | ORAL | Status: AC
  Administered 2016-12-16: 20 mg via ORAL

## 2016-12-16 MED ORDER — SUCCINYLCHOLINE CHLORIDE 20 MG/ML IJ SOLN
INTRAMUSCULAR | Status: DC | PRN
  Administered 2016-12-16: 120 mg via INTRAVENOUS

## 2016-12-16 NOTE — Anesthesia Preprocedure Evaluation (Signed)
Anesthesia Evaluation  Patient identified by MRN, date of birth, ID band Patient awake    Reviewed: Allergy & Precautions, H&P , NPO status , Patient's Chart, lab work & pertinent test results, reviewed documented beta blocker date and time   History of Anesthesia Complications Negative for: history of anesthetic complications  Airway Mallampati: III  TM Distance: >3 FB Neck ROM: full    Dental  (+) Caps, Dental Advidsory Given, Poor Dentition   Pulmonary asthma (as a child) , neg sleep apnea, neg COPD, neg recent URI, former smoker,           Cardiovascular Exercise Tolerance: Good hypertension, (-) angina+ CAD and + Peripheral Vascular Disease  (-) Past MI, (-) Cardiac Stents and (-) CABG (-) dysrhythmias + Valvular Problems/Murmurs      Neuro/Psych negative neurological ROS  negative psych ROS   GI/Hepatic Neg liver ROS, GERD  ,  Endo/Other  diabetes, Type 2, Oral Hypoglycemic AgentsMorbid obesity  Renal/GU CRFRenal disease (kidney stones)  negative genitourinary   Musculoskeletal   Abdominal   Peds  Hematology  (+) Blood dyscrasia, anemia ,   Anesthesia Other Findings Past Medical History: 09/15/2016: Abdominal aortic atherosclerosis ([place])     Comment:  CT scan June 2018 No date: Allergy No date: Cancer [facility]) 09/15/2016: Coronary artery calcification seen on CAT scan     Comment:  Previous smoker; noted on CT scan June 2018 No date: Diabetes mellitus without complication ([facility]) No date: GERD (gastroesophageal reflux disease) No date: Gout No date: Hyperlipidemia No date: Hypertension No date: Kidney function abnormal     Comment:  stage 2 No date: Kidney stones 08/20/2015: Obesity   Reproductive/Obstetrics negative OB ROS                             Anesthesia Physical Anesthesia Plan  ASA: III  Anesthesia Plan: General   Post-op Pain Management:    Induction:  Intravenous  PONV Risk Score and Plan: 2 and Ondansetron and Dexamethasone  Airway Management Planned: Oral ETT  Additional Equipment:   Intra-op Plan:   Post-operative Plan: Extubation in OR  Informed Consent: I have reviewed the patients History and Physical, chart, labs and discussed the procedure including the risks, benefits and alternatives for the proposed anesthesia with the patient or authorized representative who has indicated his/her understanding and acceptance.   Dental Advisory Given  Plan Discussed with: Anesthesiologist, CRNA and Surgeon  Anesthesia Plan Comments:         Anesthesia Quick Evaluation

## 2016-12-16 NOTE — Telephone Encounter (Signed)
Cardiac clearance was faxed to Dr. Tyrell Antonio office. Awaiting for his response. Once we have it, then we will fax it to Pre-admit.

## 2016-12-16 NOTE — Transfer of Care (Signed)
Immediate Anesthesia Transfer of Care Note  Patient: Karem Tomaso.  Procedure(s) Performed: ELECTROMAGNETIC NAVIGATION BRONCHOSCOPY (N/A )  Patient Location: PACU  Anesthesia Type:General  Level of Consciousness: awake and patient cooperative  Airway & Oxygen Therapy: Patient Spontanous Breathing and Patient connected to face mask oxygen  Post-op Assessment: Report given to RN and Post -op Vital signs reviewed and stable  Post vital signs: Reviewed and stable  Last Vitals:  Vitals:   12/16/16 1205 12/16/16 1506  BP: (!) 130/51 (!) 141/57  Pulse: 69 81  Resp: (!) 21 18  Temp: (!) 36.4 C (!) 36.3 C  SpO2: 98% 100%    Last Pain:  Vitals:   12/16/16 1506  TempSrc:   PainSc: 0-No pain         Complications: No apparent anesthesia complications

## 2016-12-16 NOTE — Op Note (Signed)
Electromagnetic Navigation Bronchoscopy: Indication: lung nodule  Preoperative Diagnosis:lung nodule Post Procedure Diagnosis:lung nodule Consent: Verbal/Written  The Risks and Benefits of the procedure explained to patient/family prior to start of procedure and I have discussed the risk for acute bleeding, increased chance of infection, increased chance of respiratory failure and cardiac arrest and death.  I have also explained to avoid all types of NSAIDs to decrease chance of bleeding, and to avoid food and drinks the midnight prior to procedure.  The procedure consists of a video camera with a light source to be placed and inserted  into the lungs to  look for abnormal tissue and to obtain tissue samples by using needle and biopsy tools.  The patient/family understand the risks and benefits and have agreed to proceed with procedure.   Hand washing performed prior to starting the procedure.   Type of Anesthesia: see Anesthesiology records .   Procedure Performed:  Virtual Bronchoscopy with Multi-planar Image analysis, 3-D reconstruction of coronal, sagittal and multi-planar images for the purposes of planning real-time bronchoscopy using the iLogic Electromagnetic Navigation Bronchoscopy System (superDimension).  Description of Procedure: After obtaining informed consent from the patient, the above sedative and anesthetic measures were carried out, flexible fiberoptic bronchoscope was inserted via Endotracheal tube after patient was intubated by CNA/Anesthesiologist.   The virtual camera was then placed into the central portion of the trachea. The trachea itself was inspected.  The main carina, right and left midstem bronchus and all the segmental and subsegmental airways by virtual bronchoscopy were inspected. The camera was directed to standard registration points at the following centers: main carina, right upper lobe bronchus, right lower lobe bronchus, right middle lobe bronchus,  left upper lobe bronchus, and the left lower lobe bronchus. This data was transferred to the i-Logic ENB system for real-time bronchoscopy.   The scope was then navigated to the LUL nodule for tissue sampling using fluoroscopy to obtain tissues samples  Specimans Obtained:  Transbronchial Fine Needle Aspirations :3  Transbronchial Forceps Biopsy times:6   Fluoroscopy:  Fluoroscopy was utilized during the course of this procedure to assure that biopsies were taken in a safe manner under fluoroscopic guidance with spot films required.   Complications:None  Estimated Blood Loss: minimal approx 1cc  Monitoring:  The patient was monitored with continuous oximetry and received supplemental nasal cannula oxygen throughout the procedure. In addition, serial blood pressure measurements and continuous electrocardiography showed these physiologic parameters to remain tolerable throughout the procedure.   Assessment and Plan/Additional Comments: Follow up Pathology Reports    Corrin [name], M.D.  [facility]  Medical Director [place] Director [facility] Cardio-Pulmonary Department

## 2016-12-16 NOTE — Telephone Encounter (Signed)
Returned call to [name] and let her know patient may start medication today if he chooses, but definitely 5 days before Dr. Corlis Leak surgery.

## 2016-12-16 NOTE — Anesthesia Post-op Follow-up Note (Signed)
Anesthesia QCDR form completed.        

## 2016-12-16 NOTE — Discharge Instructions (Signed)
AMBULATORY SURGERY  DISCHARGE INSTRUCTIONS   1) The drugs that you were given will stay in your system until tomorrow so for the next 24 hours you should not:  A) Drive an automobile B) Make any legal decisions C) Drink any alcoholic beverage   2) You may resume regular meals tomorrow.  Today it is better to start with liquids and gradually work up to solid foods.  You may eat anything you prefer, but it is better to start with liquids, then soup and crackers, and gradually work up to solid foods.   3) Please notify your doctor immediately if you have any unusual bleeding, trouble breathing, redness and pain at the surgery site, drainage, fever, or pain not relieved by medication.    4) Additional Instructions:DRINK DRINK DRINK AND THEN DRINK SOME MORE.   Soft foods may feel better today than anything hard or crunchy. Cold items may help ease any discomfort. Take good deep breaths and cough from the bottom of your soul to keep your lungs expanded. COVER YOUR MOUTH WHEN DOING THIS EXERCISE. Behave and follow instructions until your surgery next week.     Please contact your physician with any problems or Same Day Surgery at [phone], Monday through Friday 6 am to 4 pm, or [place] at [facility] number at [phone].

## 2016-12-16 NOTE — Telephone Encounter (Signed)
Call made to [facility] Care at this time and

## 2016-12-16 NOTE — Telephone Encounter (Signed)
Wife informed of starting Bactroban and to make sure pt will be here for his procedure today. Nothing further needed.

## 2016-12-16 NOTE — Telephone Encounter (Signed)
Pt neighbor, [name], would like to discuss medication for his MRSA and whether he should take it or not. Procedure is today.

## 2016-12-16 NOTE — Interval H&P Note (Signed)
History and Physical Interval Note:  12/16/2016 1:28 PM  Donald Pou.  has presented today for surgery, with the diagnosis of lung nodule  The various methods of treatment have been discussed with the patient and family. After consideration of risks, benefits and other options for treatment, the patient has consented to  Procedure(s): ELECTROMAGNETIC NAVIGATION BRONCHOSCOPY (N/A) as a surgical intervention .  The patient's history has been reviewed, patient examined, no change in status, stable for surgery.  I have reviewed the patient's chart and labs.  Questions were answered to the patient's satisfaction.     Flora Lipps

## 2016-12-16 NOTE — H&P (View-Only) (Signed)
[facility] Consultation      Assessment and Plan:  The patient is a 71 year old male with recently diagnosed colon cancer, planned for a colectomy. He had an incidentally found left upper lobe endobronchial lesion, of uncertain etiology. We are planning for a navigational bronchoscopy to rule out metastatic or primary lung cancer before his upcoming colectomy.  Lung nodule/bronchocele. -We discussed in depth, The left upper lobe finding, which may represent a bronchocele versus malignancy. I am hopeful that this is not representative of malignancy. We will plan for an ENB bronchoscopy by Dr. Mortimer Fries. -We will plan for bronchoscopy to be completed before his upcoming colectomy.  Preoperative pulmonary exam/surgical clearance. -Discussed the risks of surgery, the patient is cleared for surgery tentatively after the bronchoscopy. We would like to see that he has recovered from his bronchoscopy procedure before undergoing surgery.   Date: 12/01/2016  MRN# [id number] Donald Mcdowell May 14, 1945    Donald Mcdowell. is a 71 y.o. old male seen in consultation for chief complaint of:    Chief Complaint  Patient presents with  . Advice Only    referred by Dr.Pabon and [facility] center for evaluation bronchocele. Pt also needs surgery clearance for Dr. Dahlia Byes.  . Cough    Pt has thick clear mucus.    HPI:   The patient is recently diagnosed with colon cancer. He declined surgical therapy, he underwent a CT scan to evaluate staging, this showed a LUL endobronchial abnormality. This was discussed at tumor board, it was felt that this may represent a bronchocele vs. Primary or metastatic ca. Further clarification was needed to plan treatment of the patient's colon ca.   On my personal review of Ct scan there is an endobronchial obstruction in sub-segmental bronchus of the anterior segment of the LUL. The CT is non-contrasted, there does not appear to be significant mediastinal  lymphadenopathy or other nodules.   He is planned to undergo colectomy for colon cancer. He is a former smoker, quit earlier this year. He denies history of significant respiratory disease, he denies significant dyspnea symptoms.    PMHX:   Past Medical History:  Diagnosis Date  . Abdominal aortic atherosclerosis ([place]) 09/15/2016   CT scan June 2018  . Allergy   . Cancer ([place])   . Coronary artery calcification seen on CAT scan 09/15/2016   Previous smoker; noted on CT scan June 2018  . Diabetes mellitus without complication ([place])   . Hyperlipidemia   . Hypertension   . Kidney stones   . Obesity 08/20/2015   Surgical Hx:  Past Surgical History:  Procedure Laterality Date  . COLONOSCOPY WITH PROPOFOL N/A 11/05/2016   Procedure: COLONOSCOPY WITH PROPOFOL;  Surgeon: Lucilla Lame, MD;  Location: [facility];  Service: Gastroenterology;  Laterality: N/A;  . ESOPHAGOGASTRODUODENOSCOPY (EGD) WITH PROPOFOL N/A 11/05/2016   Procedure: ESOPHAGOGASTRODUODENOSCOPY (EGD) WITH PROPOFOL;  Surgeon: Lucilla Lame, MD;  Location: [facility];  Service: Gastroenterology;  Laterality: N/A;  Diabetic - oral meds  . kidney stones    . POLYPECTOMY N/A 11/05/2016   Procedure: POLYPECTOMY;  Surgeon: Lucilla Lame, MD;  Location: [place] [facility];  Service: Gastroenterology;  Laterality: N/A;   Family Hx:  Family History  Problem Relation Age of Onset  . Alzheimer's disease Mother   . Heart disease Father   . Diabetes Father   . Cancer Father        Prostate  . Cancer Sister  skin cancer   Social Hx:   Social History  Substance Use Topics  . Smoking status: Former Smoker    Types: Cigarettes    Quit date: 06/2016  . Smokeless tobacco: Never Used  . Alcohol use No   Medication:    Current Outpatient Prescriptions:  .  allopurinol (ZYLOPRIM) 300 MG tablet, Take 1 tablet (300 mg total) by mouth daily., Disp: 90 tablet, Rfl: 3 .  glipiZIDE (GLUCOTROL XL) 10 MG 24 hr tablet, Take  1 tablet (10 mg total) by mouth 2 (two) times daily., Disp: 180 tablet, Rfl: 1 .  glucose blood (ACCU-CHEK COMPACT STRIPS) test strip, 100 each by Other route as needed for other. Use as instructed, check FSBS once a day; LON 99 months, E11.9, Disp: 100 each, Rfl: 1 .  Iron-Vitamin C (IRON 100/C PO), Take 65 mg by mouth daily., Disp: , Rfl:  .  metFORMIN (GLUCOPHAGE) 1000 MG tablet, Take 0.5 tablets (500 mg total) by mouth 2 (two) times daily., Disp: 90 tablet, Rfl: 0 .  Omega-3 Fatty Acids (FISH OIL) 1200 MG CAPS, Take by mouth 2 (two) times daily., Disp: , Rfl:  .  quinapril (ACCUPRIL) 5 MG tablet, Take 1 tablet (5 mg total) by mouth daily., Disp: 30 tablet, Rfl: 5 .  rosuvastatin (CRESTOR) 20 MG tablet, Take 0.5 tablets (10 mg total) by mouth daily., Disp: 45 tablet, Rfl: 1 .  tamsulosin (FLOMAX) 0.4 MG CAPS capsule, Take 0.4 mg by mouth daily., Disp: , Rfl:  .  vitamin B-12 (CYANOCOBALAMIN) 100 MCG tablet, Take 100 mcg by mouth daily., Disp: , Rfl:    Allergies:  Patient has no known allergies.  Review of Systems: Gen:  Denies  fever, sweats, chills HEENT: Denies blurred vision, double vision. bleeds, sore throat Cvc:  No dizziness, chest pain. Resp:   Denies cough or sputum production, shortness of breath Gi: Denies swallowing difficulty, stomach pain. Gu:  Denies bladder incontinence, burning urine Ext:   No Joint pain, stiffness. Skin: No skin rash,  hives  Endoc:  No polyuria, polydipsia. Psych: No depression, insomnia. Other:  All other systems were reviewed with the patient and were negative other that what is mentioned in the HPI.   Physical Examination:   VS: BP (!) 146/68 (BP Location: Left Arm, Cuff Size: Normal)   Pulse 98   Resp 16   Ht 6\' 1"  (1.854 m)   Wt 280 lb (127 kg)   SpO2 98%   BMI 36.94 kg/m   General Appearance: No distress  Neuro:without focal findings,  speech normal,  HEENT: PERRLA, EOM intact.   Pulmonary: normal breath sounds, No wheezing.    CardiovascularNormal S1,S2.  No m/r/g.   Abdomen: Benign, Soft, non-tender. Renal:  No costovertebral tenderness  GU:  No performed at this time. Endoc: No evident thyromegaly, no signs of acromegaly. Skin:   warm, no rashes, no ecchymosis  Extremities: normal, no cyanosis, clubbing.  Other findings:    LABORATORY PANEL:   CBC No results for input(s): WBC, HGB, HCT, PLT in the last 168 hours. ------------------------------------------------------------------------------------------------------------------  Chemistries  No results for input(s): NA, K, CL, CO2, GLUCOSE, BUN, CREATININE, CALCIUM, MG, AST, ALT, ALKPHOS, BILITOT in the last 168 hours.  Invalid input(s): GFRCGP ------------------------------------------------------------------------------------------------------------------  Cardiac Enzymes No results for input(s): TROPONINI in the last 168 hours. ------------------------------------------------------------  RADIOLOGY:  No results found.     Thank  you for the consultation and for allowing [facility] to assist in the care  of your patient. Our recommendations are noted above.  Please contact us if we can be of further service.   Marda Stalker, MD.  Board Certified in Internal Medicine, Pulmonary Medicine, [place], and Sleep Medicine.   Pulmonary and Critical Care Office Number: [phone]  Patricia Pesa, M.D.  Merton Border, M.D  12/01/2016

## 2016-12-16 NOTE — Anesthesia Procedure Notes (Signed)
Procedure Name: Intubation Date/Time: 12/16/2016 2:04 PM Performed by: Dionne Bucy Pre-anesthesia Checklist: Patient identified, Patient being monitored, Timeout performed, Emergency Drugs available and Suction available Patient Re-evaluated:Patient Re-evaluated prior to induction Oxygen Delivery Method: Circle system utilized Preoxygenation: Pre-oxygenation with 100% oxygen Induction Type: IV induction Ventilation: Mask ventilation without difficulty Laryngoscope Size: McGraph and 4 Grade View: Grade I Tube type: Oral Tube size: 8.0 mm Number of attempts: 1 Airway Equipment and Method: Stylet Placement Confirmation: ETT inserted through vocal cords under direct vision,  positive ETCO2 and breath sounds checked- equal and bilateral Secured at: 23 cm Tube secured with: Tape Dental Injury: Teeth and Oropharynx as per pre-operative assessment

## 2016-12-16 NOTE — OR Nursing (Signed)
Discussed reasons to contact md or go to the ER.  If he has bleeding that increases with coughing, etc and it is bright red and continuously and worsens then he should seek immediate help.  Patient has coughed up 2 sputum tinted with blood.  No clots visible. Reviewed preop information regarding future surgery.

## 2016-12-17 ENCOUNTER — Encounter: Payer: Self-pay | Admitting: Internal Medicine

## 2016-12-17 ENCOUNTER — Other Ambulatory Visit: Payer: Self-pay | Admitting: Family Medicine

## 2016-12-17 ENCOUNTER — Other Ambulatory Visit: Payer: Self-pay

## 2016-12-17 DIAGNOSIS — D649 Anemia, unspecified: Secondary | ICD-10-CM

## 2016-12-17 NOTE — Anesthesia Postprocedure Evaluation (Signed)
Anesthesia Post Note  Patient: Donald Mcdowell.  Procedure(s) Performed: ELECTROMAGNETIC NAVIGATION BRONCHOSCOPY (N/A )  Patient location during evaluation: PACU Anesthesia Type: General Level of consciousness: awake and alert Pain management: pain level controlled Vital Signs Assessment: post-procedure vital signs reviewed and stable Respiratory status: spontaneous breathing, nonlabored ventilation, respiratory function stable and patient connected to nasal cannula oxygen Cardiovascular status: blood pressure returned to baseline and stable Postop Assessment: no apparent nausea or vomiting Anesthetic complications: no     Last Vitals:  Vitals:   12/16/16 1535 12/16/16 1548  BP: (!) 152/44 (!) 160/58  Pulse: 72 70  Resp:  15  Temp: (!) 36.4 C 36.5 C  SpO2: 97% 100%    Last Pain:  Vitals:   12/16/16 1548  TempSrc: Temporal  PainSc: 0-No pain                 Martha Clan

## 2016-12-17 NOTE — Telephone Encounter (Signed)
I sent a 90 day supply of metformin on 11/13/16; he shouldn't be out yet

## 2016-12-17 NOTE — Progress Notes (Signed)
Wife reports pt has a reddened spot on his left side of his face, informed his wife mary that spot may have been caused by some tape applied in the OR to secure a tube, if the spots gets worse or does not improve to call pt's surgeon.

## 2016-12-18 ENCOUNTER — Other Ambulatory Visit: Payer: Self-pay | Admitting: Pathology

## 2016-12-18 ENCOUNTER — Telehealth: Payer: Self-pay | Admitting: Cardiovascular Disease

## 2016-12-18 ENCOUNTER — Other Ambulatory Visit: Payer: Self-pay | Admitting: Family Medicine

## 2016-12-18 ENCOUNTER — Telehealth: Payer: Self-pay | Admitting: Internal Medicine

## 2016-12-18 LAB — CYTOLOGY - NON PAP

## 2016-12-18 LAB — SURGICAL PATHOLOGY

## 2016-12-18 NOTE — Telephone Encounter (Signed)
Routed in Epic the echo results addressing clearance for 10/15 hemicolectomy to [facility], [phone].

## 2016-12-18 NOTE — Telephone Encounter (Signed)
Please advise on message below.

## 2016-12-18 NOTE — Telephone Encounter (Signed)
Left voicemail with pharmacy

## 2016-12-18 NOTE — Telephone Encounter (Signed)
Patient neighbor calling for results of bronchoscopy

## 2016-12-19 DIAGNOSIS — E119 Type 2 diabetes mellitus without complications: Secondary | ICD-10-CM | POA: Diagnosis not present

## 2016-12-21 ENCOUNTER — Other Ambulatory Visit: Payer: Self-pay | Admitting: *Deleted

## 2016-12-21 ENCOUNTER — Telehealth: Payer: Self-pay | Admitting: *Deleted

## 2016-12-21 ENCOUNTER — Other Ambulatory Visit: Payer: Medicare Other

## 2016-12-21 DIAGNOSIS — R918 Other nonspecific abnormal finding of lung field: Secondary | ICD-10-CM

## 2016-12-21 NOTE — Telephone Encounter (Signed)
Appointment accepted for 315 Thursday in [place]

## 2016-12-21 NOTE — Telephone Encounter (Signed)
Donald Mcdowell reports that Dr Mortimer Fries called with Bronch results and told them to talk with Dr Mike Gip. Patient is to start prep tomorrow for colon surgery on Monday. He does not have a follow up appointment with Dr Mike Gip, but is on the Tumor Board list for Thursday. Please advise.  Surgical pathology  Order: [id number]  Status:  Final result  Visible to patient:  Yes (MyChart)  Next appt:  12/24/2016 at 01:00 PM in Oncology ([place])  Component 5d ago  SURGICAL PATHOLOGY Surgical Pathology  CASE: [phone]  PATIENT: Donald Mcdowell  Surgical Pathology Report      SPECIMEN SUBMITTED:  A. Lung, left upper lobe   CLINICAL HISTORY:  Recent diagnosis of colorectal adenocarcinoma found to have a lung  nodule on work up; previous smoker   PRE-OPERATIVE DIAGNOSIS:  LUL nodule   POST-OPERATIVE DIAGNOSIS:  Same as pre-op      DIAGNOSIS:  A. LUNG NODULE, LEFT UPPER LOBE; ENB WITH BIOPSY:  - NON-SMALL CELL CARCINOMA, FAVOR SQUAMOUS CELL CARCINOMA.   Comment:  A limited panel of immunohistochemical stains was performed to  subclassify the carcinoma. The tumor is positive for p40 and negative  for cdx-2 and TTF-1. This pattern of immunoreactivity supports a primary  lung squamous cell carcinoma. Preliminary results were communicated to  Drs. Kasa and Corcoran on 12/17/2016 via [facility] inbasket.

## 2016-12-21 NOTE — Telephone Encounter (Signed)
  Let's see him on Thursday afternoon.  M

## 2016-12-21 NOTE — Progress Notes (Signed)
Ref to oncology per DK.

## 2016-12-21 NOTE — Telephone Encounter (Signed)
We received cardiac clearance from Dr. Fletcher Anon.

## 2016-12-22 NOTE — Pre-Procedure Instructions (Signed)
CLEARED LOW TO MOD RISK AFTER ECHO 12/15/13 BY DR Fletcher Anon

## 2016-12-24 ENCOUNTER — Other Ambulatory Visit: Payer: Self-pay | Admitting: *Deleted

## 2016-12-24 ENCOUNTER — Inpatient Hospital Stay: Payer: Medicare Other | Attending: Hematology and Oncology | Admitting: Hematology and Oncology

## 2016-12-24 ENCOUNTER — Encounter: Payer: Self-pay | Admitting: Hematology and Oncology

## 2016-12-24 ENCOUNTER — Encounter: Payer: Self-pay | Admitting: *Deleted

## 2016-12-24 VITALS — BP 156/75 | HR 78 | Temp 98.6°F | Resp 20 | Wt 282.5 lb

## 2016-12-24 DIAGNOSIS — C184 Malignant neoplasm of transverse colon: Secondary | ICD-10-CM | POA: Diagnosis not present

## 2016-12-24 DIAGNOSIS — D509 Iron deficiency anemia, unspecified: Secondary | ICD-10-CM

## 2016-12-24 DIAGNOSIS — N400 Enlarged prostate without lower urinary tract symptoms: Secondary | ICD-10-CM

## 2016-12-24 DIAGNOSIS — E785 Hyperlipidemia, unspecified: Secondary | ICD-10-CM | POA: Diagnosis not present

## 2016-12-24 DIAGNOSIS — F17210 Nicotine dependence, cigarettes, uncomplicated: Secondary | ICD-10-CM

## 2016-12-24 DIAGNOSIS — E669 Obesity, unspecified: Secondary | ICD-10-CM | POA: Insufficient documentation

## 2016-12-24 DIAGNOSIS — K219 Gastro-esophageal reflux disease without esophagitis: Secondary | ICD-10-CM | POA: Insufficient documentation

## 2016-12-24 DIAGNOSIS — K2970 Gastritis, unspecified, without bleeding: Secondary | ICD-10-CM | POA: Diagnosis not present

## 2016-12-24 DIAGNOSIS — I2510 Atherosclerotic heart disease of native coronary artery without angina pectoris: Secondary | ICD-10-CM | POA: Diagnosis not present

## 2016-12-24 DIAGNOSIS — Z87442 Personal history of urinary calculi: Secondary | ICD-10-CM | POA: Diagnosis not present

## 2016-12-24 DIAGNOSIS — I10 Essential (primary) hypertension: Secondary | ICD-10-CM | POA: Insufficient documentation

## 2016-12-24 DIAGNOSIS — C3412 Malignant neoplasm of upper lobe, left bronchus or lung: Secondary | ICD-10-CM | POA: Diagnosis not present

## 2016-12-24 DIAGNOSIS — Z79899 Other long term (current) drug therapy: Secondary | ICD-10-CM | POA: Insufficient documentation

## 2016-12-24 DIAGNOSIS — M109 Gout, unspecified: Secondary | ICD-10-CM | POA: Insufficient documentation

## 2016-12-24 DIAGNOSIS — Z7984 Long term (current) use of oral hypoglycemic drugs: Secondary | ICD-10-CM | POA: Insufficient documentation

## 2016-12-24 DIAGNOSIS — C189 Malignant neoplasm of colon, unspecified: Secondary | ICD-10-CM

## 2016-12-24 DIAGNOSIS — Z7189 Other specified counseling: Secondary | ICD-10-CM

## 2016-12-24 NOTE — Progress Notes (Signed)
[name] day:  12/24/2016  Chief Complaint: Donald Mcdowell. is a 71 y.o. male with colon cancer with associated rectal bleeding who is seen for review of interval electromagnetic navigational bronchoscopy (ENB) and discussion regarding direction of therapy.  HPI:   The patient was last seen in the medical oncology clinic on 11/23/2016.  At that time, he denied any recent rectal bleeding.  Exam was unremarkable.  Imaging revealed no evidence of metastatic disease.  We discussed resection of the transverse colon malignancy.  We discussed the chest CT from 11/18/2016.  Imaging revealed abrupt termination of the left upper lobe beyond which the distal bronchus appeared dilated with mild branching into the lung parenchyma. Imaging suggested a bronchocele in the medial aspect of the left upper lobe. Differential included an endobronchial polyp, endobronchial tumor, bronchial stricture or aspirated foreign body.  He was referred to pulmonary medicine for bronchoscopy.   He underwent electromagnetic navigational bronchoscopy (ENB) by Dr. Mortimer Fries on 12/16/2016.  Left upper lobe biopsy revealed non-small cell carcinoma, favor squamous cell carcinoma.  Tumor was positive for p40 and negative for cdx-2 and TTF-1.  He is scheduled for laparoscopic right colectomy on 12/28/2016 by Dr. Dahlia Byes.  Symptomatically, patient is doing "ok". He continues to have hematochezia. His stools are still loose. He denies increased shortness of breath, chest pain, and vertiginous symptoms.  Patient is eating well, with no significant weight loss.    Past Medical History:  Diagnosis Date  . Abdominal aortic atherosclerosis ([place]) 09/15/2016   CT scan June 2018  . Allergy   . Cancer ([place])   . Coronary artery calcification seen on CAT scan 09/15/2016   Previous smoker; noted on CT scan June 2018  . Diabetes mellitus without complication ([place])   . GERD (gastroesophageal reflux  disease)   . Gout   . Hyperlipidemia   . Hypertension   . Kidney function abnormal    stage 2  . Kidney stones   . Obesity 08/20/2015    Past Surgical History:  Procedure Laterality Date  . COLONOSCOPY WITH PROPOFOL N/A 11/05/2016   Procedure: COLONOSCOPY WITH PROPOFOL;  Surgeon: Lucilla Lame, MD;  Location: [place];  Service: Gastroenterology;  Laterality: N/A;  . ELECTROMAGNETIC NAVIGATION BROCHOSCOPY N/A 12/16/2016   Procedure: ELECTROMAGNETIC NAVIGATION BRONCHOSCOPY;  Surgeon: Flora Lipps, MD;  Location: [facility];  Service: Cardiopulmonary;  Laterality: N/A;  . ESOPHAGOGASTRODUODENOSCOPY (EGD) WITH PROPOFOL N/A 11/05/2016   Procedure: ESOPHAGOGASTRODUODENOSCOPY (EGD) WITH PROPOFOL;  Surgeon: Lucilla Lame, MD;  Location: [place];  Service: Gastroenterology;  Laterality: N/A;  Diabetic - oral meds  . kidney stones    . POLYPECTOMY N/A 11/05/2016   Procedure: POLYPECTOMY;  Surgeon: Lucilla Lame, MD;  Location: [place];  Service: Gastroenterology;  Laterality: N/A;    Family History  Problem Relation Age of Onset  . Alzheimer's disease Mother   . Heart disease Father   . Diabetes Father   . Cancer Father        Prostate  . Cancer Sister        skin cancer    Social History:  reports that he quit smoking about 6 months ago. His smoking use included Cigarettes. He has never used smokeless tobacco. He reports that he does not drink alcohol or use drugs.  Patient worked for a [employer].  He retired in 1996.  He was in his 65s. Patient was a 1-2 pack per day smoker since he  was a child.  He stopped smoking in 07/2016.  He drank alcohol in the past.  He has 3 living sisters (1 sister died).  He has no children.  The patient is accompanied by his wife [name]) and his neighbor [name]) today. Communicate with [name] [phone]) [phone].  Allergies: No Known Allergies  Current Medications: Current Outpatient Prescriptions  Medication Sig Dispense  Refill  . allopurinol (ZYLOPRIM) 300 MG tablet Take 1 tablet (300 mg total) by mouth daily. 90 tablet 3  . erythromycin base (E-MYCIN) 500 MG tablet Please see bowel prep instructions 6 tablet 0  . glipiZIDE (GLUCOTROL XL) 10 MG 24 hr tablet Take 1 tablet (10 mg total) by mouth 2 (two) times daily. 180 tablet 1  . glucose blood (ACCU-CHEK COMPACT STRIPS) test strip 100 each by Other route as needed for other. Use as instructed, check FSBS once a day; LON 99 months, E11.9 100 each 1  . Iron-Vitamin C (IRON 100/C PO) Take 65 mg by mouth daily after breakfast. 2 HOURS  AFTER BREAKFAST    . Magnesium Oxide 250 MG TABS Take 250 mg by mouth daily.    . metFORMIN (GLUCOPHAGE) 1000 MG tablet TAKE 1/2 (ONE-HALF) TABLET BY MOUTH TWICE DAILY 90 tablet 0  . mupirocin nasal ointment (BACTROBAN NASAL) 2 % Place 1 application into the nose 2 (two) times daily. Use one-half of tube in each nostril twice daily for five (5) days. After application, press sides of nose together and gently massage. 10 g 0  . neomycin (MYCIFRADIN) 500 MG tablet Please see bowel prep instructions 6 tablet 0  . Omega-3 Fatty Acids (FISH OIL) 1200 MG CAPS Take 1,200 mg by mouth 2 (two) times daily.     . polyethylene glycol powder (GLYCOLAX/MIRALAX) powder Please see bowel prep instructions 255 g 0  . quinapril (ACCUPRIL) 5 MG tablet Take 1 tablet (5 mg total) by mouth daily. 30 tablet 5  . rosuvastatin (CRESTOR) 20 MG tablet Take 0.5 tablets (10 mg total) by mouth daily. (Patient taking differently: Take 10 mg by mouth at bedtime. TAKES 0.5 TABLET) 45 tablet 1  . tamsulosin (FLOMAX) 0.4 MG CAPS capsule Take 0.4 mg by mouth daily.    . vitamin B-12 (CYANOCOBALAMIN) 100 MCG tablet Take 100 mcg by mouth daily.     No current facility-administered medications for this visit.     Review of Systems:  GENERAL:  Feels "ok".  No fevers or sweats.  Weight gain of 5 pounds since last visit. PERFORMANCE STATUS (ECOG):  1 HEENT:  No visual  changes, runny nose, sore throat, mouth sores or tenderness. Lungs: No shortness of breath or cough.  No hemoptysis. Cardiac:  No chest pain, palpitations, orthopnea, or PND. GI:  No interval bleeding.  Some diarrhea.  No nausea, vomiting, constipation, or melena. GU:  No urgency, frequency, dysuria, or hematuria. Musculoskeletal:  No back pain.  No joint pain.  No muscle tenderness. Extremities:  No pain or swelling. Skin:  No rashes or skin changes. Neuro:  No headache, numbness or weakness, balance or coordination issues. Endocrine:  Diabetes.  No thyroid issues, hot flashes or night sweats. Psych:  No mood changes, depression or anxiety. Pain:  No focal pain. Review of systems:  All other systems reviewed and found to be negative.  Physical Exam: Blood pressure (!) 156/75, pulse 78, temperature 98.6 F (37 C), temperature source Tympanic, resp. rate 20, weight 282 lb 8 oz (128.1 kg). GENERAL:  Well developed, well nourished, heavyset  gentleman sitting comfortably in the exam room in no acute distress. MENTAL STATUS:  Alert and oriented to person, place and time. HEAD:  Thin gray hair.  Male pattern baldness.  Normocephalic, atraumatic, face symmetric, no Cushingoid features. EYES:  Glasses.  Blue eyes.  Pupils equal round and reactive to light and accomodation.  No conjunctivitis or scleral icterus. ENT:  Oropharynx clear without lesion.  Tongue normal. Mucous membranes moist.  RESPIRATORY:  Clear to auscultation without rales, wheezes or rhonchi. CARDIOVASCULAR:  Regular rate and rhythm without murmur, rub or gallop. ABDOMEN:  Fully round.  Soft, non-tender, with active bowel sounds, and no appreciable hepatosplenomegaly.  No masses. SKIN:  No rashes, ulcers or lesions. EXTREMITIES: No edema, no skin discoloration or tenderness.  No palpable cords. LYMPH NODES: No palpable cervical, supraclavicular, axillary or inguinal adenopathy  NEUROLOGICAL: Unremarkable. PSYCH:   Appropriate.   No visits with results within 3 Day(s) from this visit.  Latest known visit with results is:  Admission on 12/16/2016, Discharged on 12/16/2016  Component Date Value Ref Range Status  . Glucose-Capillary 12/16/2016 141* 65 - 99 mg/dL Final  . CYTOLOGY - NON GYN 12/16/2016    Final                   Value:Cytology - Non PAP CASE: [id number] PATIENT: Braidon Llewellyn Non-Gyn Cytology Report     SPECIMEN SUBMITTED: A. Lung, left upper lobe; FNA  CLINICAL HISTORY: Recent diagnosis of colorectal adenocarcinoma found to have a lung nodule on work up; previous smoker  PRE-OPERATIVE DIAGNOSIS: LUL nodule  POST-OPERATIVE DIAGNOSIS: Same as pre-op     DIAGNOSIS: A. LUNG NODULE, LEFT UPPER LOBE; ENB WITH FNA: - NON-SMALL CELL CARCINOMA, FAVOR SQUAMOUS CELL CARCINOMA.  Comment: See concurrent case [id number]. Findings were communicated to Drs. Kasa and Corcoran on 12/17/2016.   GROSS DESCRIPTION:  A. Site: left upper lobe lung Procedure: ENB Cytotechnologist: Ashlee Howze and Molli Barrows Specimen(s) collected: 3 Diff Quik stained slides 3  Pap stained slides Specimen labeled LUL FNA :      Description: red CytoLyt multiple wispy fragment      Submitted for:           ThinPrep           Cell block(s): 1  A forceps biopsy was obtained and will be repor                         ted in a separate ARS report. Final Diagnosis performed by Quay Burow, MD.  Electronically signed 12/18/2016 2:31:08PM    The electronic signature indicates that the named Attending Pathologist has evaluated the specimen  Technical component performed at [facility], [address] [place], [place] [address] Lab: [phone] Dir: Darrick Penna. Evette Doffing, MD  Professional component performed at [facility], [facility], [place], [place], [place] [address] Lab: [phone] Dir: Dellia Nims. Reuel Derby, MD    . SURGICAL PATHOLOGY 12/16/2016    Final                    Value:Surgical Pathology CASE: [id number] PATIENT: Olene Craven Surgical Pathology Report     SPECIMEN SUBMITTED: A. Lung, left upper lobe  CLINICAL HISTORY: Recent diagnosis of colorectal adenocarcinoma found to have a lung nodule on work up; previous smoker  PRE-OPERATIVE DIAGNOSIS: LUL nodule  POST-OPERATIVE DIAGNOSIS: Same as pre-op     DIAGNOSIS: A. LUNG NODULE, LEFT UPPER LOBE; ENB WITH BIOPSY: - NON-SMALL CELL  CARCINOMA, FAVOR SQUAMOUS CELL CARCINOMA.  Comment: A limited panel of immunohistochemical stains was performed to subclassify the carcinoma. The tumor is positive for p40 and negative for cdx-2 and TTF-1. This pattern of immunoreactivity supports a primary lung squamous cell carcinoma. Preliminary results were communicated to Drs. Kasa and Corcoran on 12/17/2016 via [facility] inbasket.   GROSS DESCRIPTION:  A. Labeled: LUL biopsy  Tissue fragment(s): multiple  Size: Aggregate, 1.4 x 0.9 x 0.1  Description: shaggy red friable fragments w                         rapped in lens paper and submitted in a mesh bag  Entirely submitted in 1 cassette(s).  6 Diff Quik stained slides also prepared at the procedure Final Diagnosis performed by Quay Burow, MD.  Electronically signed 12/18/2016 2:29:52PM    The electronic signature indicates that the named Attending Pathologist has evaluated the specimen  Technical component performed at [facility], [address] [place], [place] [address] Lab: [phone] Dir: Darrick Penna. Evette Doffing, MD  Professional component performed at [facility], [facility], [place], [place], [place] [address] Lab: [phone] Dir: Dellia Nims. Reuel Derby, MD      Assessment:  Nikesh Teschner. is a 71 y.o. male with transverse colon cancer.  He presented with bloody diarrhea and iron deficiency anemia.  Colonoscopy on 11/05/2016 revealed a large partially obstructing mass in the transverse colon. Pathology  confirmed adenocarcinoma.  In addition there were multiple other polyps (2 tubulovillous adenomas and 9 adenomas).  There were 2 fragments showing high grade dysplasia in the sigmoid colon polyps.  CEA was 1.4 on 11/09/2016.  [facility] genetic testing was negative on 11/18/2016.  Abdomen and pelvic CT on 11/09/2016 revealed mid transverse colon segment of luminal narrowing with wall thickening compatible with colon cancer.  There was a single enlarged adjacent mesenteric lymph node and prominent subcentimeter lymph nodes anterior to the pancreas which may be metastatic.  There was no evidence of distant metastasis.  There was a left kidney lower pole complex cyst with thin internal septations (Bosniak IIF).  His prostate was enlarged and extended into the floor of bladder.  There was right kidney lower pole punctate nonobstructing nephrolithiasis.  Chest CT on 11/18/2016 revealed no metastatic disease.  There was abrupt termination of the left upper lobe beyond which the distal bronchus appeared dilated with mild branching into the lung parenchyma.  Imaging suggested a bronchocele in the medial aspect of the left upper lobe. Differential included an endobronchial polyp, endobronchial tumor, bronchial stricture or aspirated foreign body.   Electromagnetic navigational bronchoscopy (ENB) on 12/16/2016 revealed non-small cell carcinoma, favor squamous cell lung carcinoma.  Tumor was positive for p40 and negative for cdx-2 and TTF-1.  EGD on 11/05/2016 revealed gastritis.  Pathology confirmed chronic mild gastritis and features of a healing erosion in the stomach.  There was no H pylori, dysplasia or malignancy.  He has iron deficiency anemia.  He is on oral iron.  Ferritin was 6 on 09/04/2016.  Hematocrit was 29.7 and hemoglobin 8.8 on 09/04/2016.  Hematocrit was 28.1, hemoglobin 9.3, and MCV 87.9 on 11/09/2016.   Symptomatically, patient is doing "ok". Patient denies gross hematochezia. He denies pain.  Exam is unremarkable.    Plan: 1.  Discuss interval bronchoscopy and pathology- squamous cell lung cancer. 2.  Review genetic testing results. Results negative for Lynch syndrome. Patient had a $1500.00 out of pocket expense. Number provided to patient  for him to follow up on insurance coverage.  3.  Laparoscopic right colectomy scheduled for 12/28/2016 by Dr. Dahlia Byes. 4.  Anticipate referral  to radiation oncology for SBRT approximately 1 month after colectomy. Initial thoughts are that patient will require 3-5 treatments. We will address during the next RTC visit.  5.  Discuss medical oncology follow up schedule with treatment and after treatment. 6.  Preauthorize Xeloda 7.  RTC on 01/21/2017 for MD assessment, labs (CBC with diff, CMP, ferrtin, iron studies), and review of surgical pathology.   [name], NP  12/24/2016, 3:52 PM   I saw and evaluated the patient, participating in the key portions of the service and reviewing pertinent diagnostic studies and records. I reviewed the nurse practitioner's note and agree with the findings and the plan.  The assessment and plan were discussed with the patient.  Multiple questions were asked by the patient and answered.   Lequita Asal, MD 12/24/2016,3:52 PM

## 2016-12-24 NOTE — Progress Notes (Signed)
Pt in for follow up for colon cancer.  Reports having blood mixed with stool but not as much as in the past.

## 2016-12-25 NOTE — Progress Notes (Signed)
  Oncology Nurse Navigator Documentation  Navigator Location: [facility]Med Onc (12/25/16 0900)   )Navigator Encounter Type: Follow-up Appt;Diagnostic Results (12/25/16 0900)   Abnormal Finding Date: 11/18/16 (12/25/16 0900) Confirmed Diagnosis Date: 12/18/16 (12/25/16 0900)               Patient Visit Type: MedOnc (12/25/16 0900) Treatment Phase: Pre-Tx/Tx Discussion (12/25/16 0900) Barriers/Navigation Needs: Coordination of Care (12/25/16 0900)   Interventions: Coordination of Care (12/25/16 0900)   Coordination of Care: Appts (12/25/16 0900)        Acuity: Level 1 (12/25/16 0900) Acuity Level 1: Initial guidance, education and coordination as needed (12/25/16 0900)  met with patient after follow up visit with Dr. Mike Gip. Pt given results from recent pathology report. All questions answered at the time of visit. Pt and family given education materials regarding diagnosis and info regarding supportive services. Contact info given to pt and instructed to call with further questions. Informed pt and family that they will be notified with next follow up appt. Pt and family verbalized understanding.     Time Spent with Patient: 30 (12/25/16 0900)

## 2016-12-28 ENCOUNTER — Inpatient Hospital Stay: Payer: Medicare Other | Admitting: Registered Nurse

## 2016-12-28 ENCOUNTER — Encounter: Payer: Self-pay | Admitting: *Deleted

## 2016-12-28 ENCOUNTER — Encounter
Admission: RE | Disposition: A | Discharge status: Home / Self Care | Payer: Self-pay | Source: Ambulatory Visit | Attending: Surgery

## 2016-12-28 ENCOUNTER — Inpatient Hospital Stay
Admission: RE | Admit: 2016-12-28 | Discharge: 2017-01-01 | DRG: 330 | Disposition: A | Discharge status: Home / Self Care | Payer: Medicare Other | Source: Ambulatory Visit | Attending: Surgery | Admitting: Surgery

## 2016-12-28 DIAGNOSIS — D62 Acute posthemorrhagic anemia: Secondary | ICD-10-CM | POA: Diagnosis not present

## 2016-12-28 DIAGNOSIS — C182 Malignant neoplasm of ascending colon: Secondary | ICD-10-CM | POA: Diagnosis present

## 2016-12-28 DIAGNOSIS — I10 Essential (primary) hypertension: Secondary | ICD-10-CM | POA: Diagnosis present

## 2016-12-28 DIAGNOSIS — C772 Secondary and unspecified malignant neoplasm of intra-abdominal lymph nodes: Secondary | ICD-10-CM | POA: Diagnosis not present

## 2016-12-28 DIAGNOSIS — Z820 Family history of epilepsy and other diseases of the nervous system: Secondary | ICD-10-CM | POA: Diagnosis not present

## 2016-12-28 DIAGNOSIS — Z87891 Personal history of nicotine dependence: Secondary | ICD-10-CM | POA: Diagnosis not present

## 2016-12-28 DIAGNOSIS — Z6839 Body mass index (BMI) 39.0-39.9, adult: Secondary | ICD-10-CM | POA: Diagnosis not present

## 2016-12-28 DIAGNOSIS — Z87442 Personal history of urinary calculi: Secondary | ICD-10-CM

## 2016-12-28 DIAGNOSIS — Z5331 Laparoscopic surgical procedure converted to open procedure: Secondary | ICD-10-CM

## 2016-12-28 DIAGNOSIS — C189 Malignant neoplasm of colon, unspecified: Secondary | ICD-10-CM | POA: Diagnosis not present

## 2016-12-28 DIAGNOSIS — K219 Gastro-esophageal reflux disease without esophagitis: Secondary | ICD-10-CM | POA: Diagnosis not present

## 2016-12-28 DIAGNOSIS — Z23 Encounter for immunization: Secondary | ICD-10-CM | POA: Diagnosis not present

## 2016-12-28 DIAGNOSIS — C184 Malignant neoplasm of transverse colon: Principal | ICD-10-CM | POA: Diagnosis present

## 2016-12-28 DIAGNOSIS — C786 Secondary malignant neoplasm of retroperitoneum and peritoneum: Secondary | ICD-10-CM | POA: Diagnosis not present

## 2016-12-28 DIAGNOSIS — Z7984 Long term (current) use of oral hypoglycemic drugs: Secondary | ICD-10-CM

## 2016-12-28 DIAGNOSIS — I700 Atherosclerosis of aorta: Secondary | ICD-10-CM | POA: Diagnosis present

## 2016-12-28 DIAGNOSIS — E119 Type 2 diabetes mellitus without complications: Secondary | ICD-10-CM | POA: Diagnosis not present

## 2016-12-28 DIAGNOSIS — Z808 Family history of malignant neoplasm of other organs or systems: Secondary | ICD-10-CM | POA: Diagnosis not present

## 2016-12-28 DIAGNOSIS — Z833 Family history of diabetes mellitus: Secondary | ICD-10-CM

## 2016-12-28 DIAGNOSIS — Z8249 Family history of ischemic heart disease and other diseases of the circulatory system: Secondary | ICD-10-CM | POA: Diagnosis not present

## 2016-12-28 DIAGNOSIS — E785 Hyperlipidemia, unspecified: Secondary | ICD-10-CM | POA: Diagnosis not present

## 2016-12-28 DIAGNOSIS — Z86010 Personal history of colonic polyps: Secondary | ICD-10-CM

## 2016-12-28 HISTORY — PX: PARTIAL COLECTOMY: SHX5273

## 2016-12-28 LAB — GLUCOSE, CAPILLARY
Glucose-Capillary: 153 mg/dL — ABNORMAL HIGH (ref 65–99)
Glucose-Capillary: 234 mg/dL — ABNORMAL HIGH (ref 65–99)
Glucose-Capillary: 257 mg/dL — ABNORMAL HIGH (ref 65–99)
Glucose-Capillary: 291 mg/dL — ABNORMAL HIGH (ref 65–99)

## 2016-12-28 LAB — CBC
HEMATOCRIT: 36.5 % — AB (ref 40.0–52.0)
HEMOGLOBIN: 11.9 g/dL — AB (ref 13.0–18.0)
MCH: 30.6 pg (ref 26.0–34.0)
MCHC: 32.5 g/dL (ref 32.0–36.0)
MCV: 94.2 fL (ref 80.0–100.0)
Platelets: 241 10*3/uL (ref 150–440)
RBC: 3.87 MIL/uL — ABNORMAL LOW (ref 4.40–5.90)
RDW: 14.8 % — ABNORMAL HIGH (ref 11.5–14.5)
WBC: 5.7 10*3/uL (ref 3.8–10.6)

## 2016-12-28 LAB — CREATININE, SERUM
CREATININE: 1.05 mg/dL (ref 0.61–1.24)
GFR calc Af Amer: >60 mL/min (ref >60)
GFR calc non Af Amer: >60 mL/min (ref >60)

## 2016-12-28 LAB — ABO/RH: ABO/RH(D): A POS

## 2016-12-28 SURGERY — COLECTOMY, PARTIAL
Anesthesia: General | Site: Abdomen | Wound class: Clean Contaminated

## 2016-12-28 MED ORDER — FENTANYL CITRATE (PF) 100 MCG/2ML IJ SOLN
INTRAMUSCULAR | Status: DC | PRN
  Administered 2016-12-28: 100 ug via INTRAVENOUS
  Administered 2016-12-28 (×3): 50 ug via INTRAVENOUS
  Administered 2016-12-28: 100 ug via INTRAVENOUS

## 2016-12-28 MED ORDER — EPHEDRINE SULFATE 50 MG/ML IJ SOLN
INTRAMUSCULAR | Status: AC
  Filled 2016-12-28: qty 1

## 2016-12-28 MED ORDER — PROPOFOL 10 MG/ML IV BOLUS
INTRAVENOUS | Status: DC | PRN
  Administered 2016-12-28: 120 mg via INTRAVENOUS
  Administered 2016-12-28: 30 mg via INTRAVENOUS

## 2016-12-28 MED ORDER — MIDAZOLAM HCL 2 MG/2ML IJ SOLN
INTRAMUSCULAR | Status: DC | PRN
  Administered 2016-12-28: 2 mg via INTRAVENOUS

## 2016-12-28 MED ORDER — PROPOFOL 10 MG/ML IV BOLUS
INTRAVENOUS | Status: AC
  Filled 2016-12-28: qty 40

## 2016-12-28 MED ORDER — MIDAZOLAM HCL 2 MG/2ML IJ SOLN
INTRAMUSCULAR | Status: AC
  Filled 2016-12-28: qty 2

## 2016-12-28 MED ORDER — BUPIVACAINE-EPINEPHRINE (PF) 0.5% -1:200000 IJ SOLN
INTRAMUSCULAR | Status: DC | PRN
  Administered 2016-12-28: 30 mL

## 2016-12-28 MED ORDER — SODIUM CHLORIDE 0.9 % IV SOLN
INTRAVENOUS | Status: DC
  Administered 2016-12-28 (×2): via INTRAVENOUS

## 2016-12-28 MED ORDER — MEPERIDINE HCL 50 MG/ML IJ SOLN: 6.2500 mg | INTRAMUSCULAR | Status: DC | PRN

## 2016-12-28 MED ORDER — SODIUM CHLORIDE 0.9 % IV SOLN
1.0000 g | INTRAVENOUS | Status: AC
  Administered 2016-12-28: 1 g via INTRAVENOUS
  Filled 2016-12-28: qty 1

## 2016-12-28 MED ORDER — SUGAMMADEX SODIUM 500 MG/5ML IV SOLN
INTRAVENOUS | Status: DC | PRN
  Administered 2016-12-28: 500 mg via INTRAVENOUS

## 2016-12-28 MED ORDER — SODIUM CHLORIDE 0.9 % IJ SOLN
INTRAMUSCULAR | Status: AC
  Filled 2016-12-28: qty 50

## 2016-12-28 MED ORDER — ENOXAPARIN SODIUM 40 MG/0.4ML ~~LOC~~ SOLN: 40.0000 mg | SUBCUTANEOUS | Status: DC

## 2016-12-28 MED ORDER — FENTANYL CITRATE (PF) 100 MCG/2ML IJ SOLN
INTRAMUSCULAR | Status: AC
  Filled 2016-12-28: qty 2

## 2016-12-28 MED ORDER — ACETAMINOPHEN 500 MG PO TABS
1000.0000 mg | ORAL_TABLET | ORAL | Status: AC
  Administered 2016-12-28: 1000 mg via ORAL

## 2016-12-28 MED ORDER — KETOROLAC TROMETHAMINE 15 MG/ML IJ SOLN
15.0000 mg | Freq: Four times a day (QID) | INTRAMUSCULAR | Status: DC
  Administered 2016-12-28 – 2016-12-29 (×3): 15 mg via INTRAVENOUS
  Filled 2016-12-28 (×3): qty 1

## 2016-12-28 MED ORDER — ROCURONIUM BROMIDE 50 MG/5ML IV SOLN
INTRAVENOUS | Status: AC
  Filled 2016-12-28: qty 1

## 2016-12-28 MED ORDER — INSULIN ASPART 100 UNIT/ML ~~LOC~~ SOLN
0.0000 [IU] | Freq: Three times a day (TID) | SUBCUTANEOUS | Status: DC
  Administered 2016-12-28: 11 [IU] via SUBCUTANEOUS
  Administered 2016-12-29: 4 [IU] via SUBCUTANEOUS
  Administered 2016-12-29 – 2016-12-30 (×2): 7 [IU] via SUBCUTANEOUS
  Administered 2016-12-30: 3 [IU] via SUBCUTANEOUS
  Administered 2016-12-30 – 2016-12-31 (×4): 4 [IU] via SUBCUTANEOUS
  Administered 2017-01-01: 3 [IU] via SUBCUTANEOUS
  Filled 2016-12-28 (×10): qty 1

## 2016-12-28 MED ORDER — DEXAMETHASONE SODIUM PHOSPHATE 10 MG/ML IJ SOLN
INTRAMUSCULAR | Status: AC
  Filled 2016-12-28: qty 1

## 2016-12-28 MED ORDER — METHOCARBAMOL 500 MG PO TABS
500.0000 mg | ORAL_TABLET | Freq: Four times a day (QID) | ORAL | Status: DC | PRN
  Filled 2016-12-28: qty 1

## 2016-12-28 MED ORDER — LIDOCAINE HCL (CARDIAC) 20 MG/ML IV SOLN
INTRAVENOUS | Status: DC | PRN
  Administered 2016-12-28: 100 mg via INTRAVENOUS

## 2016-12-28 MED ORDER — ONDANSETRON HCL 4 MG/2ML IJ SOLN
INTRAMUSCULAR | Status: AC
  Administered 2016-12-28: 15:00:00
  Filled 2016-12-28: qty 2

## 2016-12-28 MED ORDER — BUPIVACAINE LIPOSOME 1.3 % IJ SUSP
INTRAMUSCULAR | Status: DC | PRN
  Administered 2016-12-28: 20 mL

## 2016-12-28 MED ORDER — MORPHINE SULFATE (PF) 2 MG/ML IV SOLN
2.0000 mg | INTRAVENOUS | Status: DC | PRN
Start: 2016-12-28 — End: 2017-01-01

## 2016-12-28 MED ORDER — ACETAMINOPHEN 500 MG PO TABS
ORAL_TABLET | ORAL | Status: AC
  Administered 2016-12-28: 1000 mg via ORAL
  Filled 2016-12-28: qty 2

## 2016-12-28 MED ORDER — HEPARIN SODIUM (PORCINE) 5000 UNIT/ML IJ SOLN
INTRAMUSCULAR | Status: AC
Start: 2016-12-28 — End: 2016-12-28
  Administered 2016-12-28: 5000 [IU] via SUBCUTANEOUS
  Filled 2016-12-28: qty 1

## 2016-12-28 MED ORDER — PANTOPRAZOLE SODIUM 40 MG IV SOLR
40.0000 mg | Freq: Two times a day (BID) | INTRAVENOUS | Status: DC
  Administered 2016-12-28 – 2017-01-01 (×9): 40 mg via INTRAVENOUS
  Filled 2016-12-28 (×9): qty 40

## 2016-12-28 MED ORDER — PROCHLORPERAZINE EDISYLATE 5 MG/ML IJ SOLN
5.0000 mg | Freq: Four times a day (QID) | INTRAMUSCULAR | Status: DC | PRN
  Filled 2016-12-28: qty 2

## 2016-12-28 MED ORDER — TAMSULOSIN HCL 0.4 MG PO CAPS
0.4000 mg | ORAL_CAPSULE | Freq: Every day | ORAL | Status: DC
  Administered 2016-12-28 – 2017-01-01 (×5): 0.4 mg via ORAL
  Filled 2016-12-28 (×5): qty 1

## 2016-12-28 MED ORDER — FAMOTIDINE 20 MG PO TABS
20.0000 mg | ORAL_TABLET | Freq: Once | ORAL | Status: AC
  Administered 2016-12-28: 20 mg via ORAL

## 2016-12-28 MED ORDER — PHENYLEPHRINE HCL 10 MG/ML IJ SOLN
INTRAMUSCULAR | Status: DC | PRN
  Administered 2016-12-28: 200 ug via INTRAVENOUS
  Administered 2016-12-28 (×4): 100 ug via INTRAVENOUS
  Administered 2016-12-28 (×2): 200 ug via INTRAVENOUS
  Administered 2016-12-28: 100 ug via INTRAVENOUS
  Administered 2016-12-28: 200 ug via INTRAVENOUS

## 2016-12-28 MED ORDER — ONDANSETRON HCL 4 MG/2ML IJ SOLN
INTRAMUSCULAR | Status: DC | PRN
  Administered 2016-12-28: 4 mg via INTRAVENOUS

## 2016-12-28 MED ORDER — HEPARIN SODIUM (PORCINE) 5000 UNIT/ML IJ SOLN
5000.0000 [IU] | Freq: Once | INTRAMUSCULAR | Status: AC
  Administered 2016-12-28: 5000 [IU] via SUBCUTANEOUS

## 2016-12-28 MED ORDER — SUGAMMADEX SODIUM 500 MG/5ML IV SOLN
INTRAVENOUS | Status: AC
  Filled 2016-12-28: qty 5

## 2016-12-28 MED ORDER — LIDOCAINE HCL (PF) 2 % IJ SOLN
INTRAMUSCULAR | Status: AC
  Filled 2016-12-28: qty 10

## 2016-12-28 MED ORDER — ACETAMINOPHEN 10 MG/ML IV SOLN
INTRAVENOUS | Status: AC
  Filled 2016-12-28: qty 100

## 2016-12-28 MED ORDER — HYDRALAZINE HCL 20 MG/ML IJ SOLN: 10.0000 mg | INTRAMUSCULAR | Status: DC | PRN

## 2016-12-28 MED ORDER — PROMETHAZINE HCL 25 MG/ML IJ SOLN: 6.2500 mg | INTRAMUSCULAR | Status: DC | PRN

## 2016-12-28 MED ORDER — GABAPENTIN 300 MG PO CAPS
ORAL_CAPSULE | ORAL | Status: AC
  Administered 2016-12-28: 300 mg via ORAL
  Filled 2016-12-28: qty 1

## 2016-12-28 MED ORDER — SODIUM CHLORIDE 0.9 % IJ SOLN
INTRAMUSCULAR | Status: DC | PRN
  Administered 2016-12-28: 50 mL

## 2016-12-28 MED ORDER — GABAPENTIN 300 MG PO CAPS
300.0000 mg | ORAL_CAPSULE | ORAL | Status: AC
  Administered 2016-12-28: 300 mg via ORAL

## 2016-12-28 MED ORDER — SUCCINYLCHOLINE CHLORIDE 20 MG/ML IJ SOLN
INTRAMUSCULAR | Status: AC
  Filled 2016-12-28: qty 1

## 2016-12-28 MED ORDER — PHENYLEPHRINE HCL 10 MG/ML IJ SOLN
INTRAMUSCULAR | Status: AC
  Filled 2016-12-28: qty 1

## 2016-12-28 MED ORDER — INSULIN ASPART 100 UNIT/ML ~~LOC~~ SOLN
4.0000 [IU] | Freq: Three times a day (TID) | SUBCUTANEOUS | Status: DC
  Administered 2016-12-29 – 2016-12-30 (×6): 4 [IU] via SUBCUTANEOUS
  Filled 2016-12-28 (×8): qty 1

## 2016-12-28 MED ORDER — PROCHLORPERAZINE MALEATE 10 MG PO TABS
10.0000 mg | ORAL_TABLET | Freq: Four times a day (QID) | ORAL | Status: DC | PRN
  Filled 2016-12-28: qty 1

## 2016-12-28 MED ORDER — FENTANYL CITRATE (PF) 100 MCG/2ML IJ SOLN
INTRAMUSCULAR | Status: AC
  Administered 2016-12-28: 25 ug via INTRAVENOUS
  Filled 2016-12-28: qty 2

## 2016-12-28 MED ORDER — FAMOTIDINE 20 MG PO TABS
ORAL_TABLET | ORAL | Status: AC
  Administered 2016-12-28: 20 mg via ORAL
  Filled 2016-12-28: qty 1

## 2016-12-28 MED ORDER — MUPIROCIN 2 % EX OINT
TOPICAL_OINTMENT | Freq: Two times a day (BID) | CUTANEOUS | Status: DC
  Administered 2016-12-28 – 2017-01-01 (×9): via NASAL
  Filled 2016-12-28: qty 22

## 2016-12-28 MED ORDER — INSULIN ASPART 100 UNIT/ML ~~LOC~~ SOLN
0.0000 [IU] | Freq: Every day | SUBCUTANEOUS | Status: DC
  Administered 2016-12-28: 3 [IU] via SUBCUTANEOUS
  Filled 2016-12-28: qty 1

## 2016-12-28 MED ORDER — INFLUENZA VAC SPLIT HIGH-DOSE 0.5 ML IM SUSY
0.5000 mL | PREFILLED_SYRINGE | INTRAMUSCULAR | Status: AC
  Administered 2016-12-30: 0.5 mL via INTRAMUSCULAR
  Filled 2016-12-28 (×2): qty 0.5

## 2016-12-28 MED ORDER — EPHEDRINE SULFATE 50 MG/ML IJ SOLN
INTRAMUSCULAR | Status: DC | PRN
  Administered 2016-12-28 (×3): 10 mg via INTRAVENOUS

## 2016-12-28 MED ORDER — OXYCODONE HCL 5 MG PO TABS
5.0000 mg | ORAL_TABLET | ORAL | Status: DC | PRN
  Administered 2016-12-28 – 2016-12-29 (×3): 5 mg via ORAL
  Administered 2016-12-30: 10 mg via ORAL
  Administered 2016-12-30 (×2): 5 mg via ORAL
  Administered 2016-12-30: 10 mg via ORAL
  Administered 2016-12-31: 5 mg via ORAL
  Administered 2016-12-31 (×2): 10 mg via ORAL
  Administered 2016-12-31 – 2017-01-01 (×3): 5 mg via ORAL
  Administered 2017-01-01: 10 mg via ORAL
  Filled 2016-12-28: qty 2
  Filled 2016-12-28 (×2): qty 1
  Filled 2016-12-28: qty 2
  Filled 2016-12-28 (×2): qty 1
  Filled 2016-12-28 (×2): qty 2
  Filled 2016-12-28 (×3): qty 1
  Filled 2016-12-28 (×2): qty 2
  Filled 2016-12-28 (×2): qty 1

## 2016-12-28 MED ORDER — ROCURONIUM BROMIDE 100 MG/10ML IV SOLN
INTRAVENOUS | Status: DC | PRN
  Administered 2016-12-28: 10 mg via INTRAVENOUS
  Administered 2016-12-28: 30 mg via INTRAVENOUS
  Administered 2016-12-28: 10 mg via INTRAVENOUS
  Administered 2016-12-28: 20 mg via INTRAVENOUS
  Administered 2016-12-28: 50 mg via INTRAVENOUS

## 2016-12-28 MED ORDER — LACTATED RINGERS IV SOLN
INTRAVENOUS | Status: DC
  Administered 2016-12-28 – 2016-12-30 (×4): via INTRAVENOUS

## 2016-12-28 MED ORDER — BUPIVACAINE LIPOSOME 1.3 % IJ SUSP
INTRAMUSCULAR | Status: AC
  Filled 2016-12-28: qty 20

## 2016-12-28 MED ORDER — ACETAMINOPHEN 10 MG/ML IV SOLN
INTRAVENOUS | Status: DC | PRN
  Administered 2016-12-28: 1000 mg via INTRAVENOUS

## 2016-12-28 MED ORDER — FENTANYL CITRATE (PF) 100 MCG/2ML IJ SOLN
25.0000 ug | INTRAMUSCULAR | Status: DC | PRN
  Administered 2016-12-28 (×4): 25 ug via INTRAVENOUS

## 2016-12-28 MED ORDER — BUPIVACAINE-EPINEPHRINE (PF) 0.5% -1:200000 IJ SOLN
INTRAMUSCULAR | Status: AC
  Filled 2016-12-28: qty 30

## 2016-12-28 MED ORDER — ONDANSETRON 4 MG PO TBDP: 4.0000 mg | ORAL_TABLET | Freq: Four times a day (QID) | ORAL | Status: DC | PRN

## 2016-12-28 MED ORDER — ONDANSETRON HCL 4 MG/2ML IJ SOLN
INTRAMUSCULAR | Status: AC
  Filled 2016-12-28: qty 2

## 2016-12-28 MED ORDER — DEXAMETHASONE SODIUM PHOSPHATE 10 MG/ML IJ SOLN
INTRAMUSCULAR | Status: DC | PRN
  Administered 2016-12-28: 10 mg via INTRAVENOUS

## 2016-12-28 MED ORDER — GLYCOPYRROLATE 0.2 MG/ML IJ SOLN
INTRAMUSCULAR | Status: AC
  Filled 2016-12-28: qty 1

## 2016-12-28 MED ORDER — SODIUM CHLORIDE 0.9 % IR SOLN
Status: DC | PRN
  Administered 2016-12-28: 1000 mL

## 2016-12-28 MED ORDER — ONDANSETRON HCL 4 MG/2ML IJ SOLN: 4.0000 mg | Freq: Four times a day (QID) | INTRAMUSCULAR | Status: DC | PRN

## 2016-12-28 MED ORDER — FENTANYL CITRATE (PF) 100 MCG/2ML IJ SOLN
INTRAMUSCULAR | Status: AC
  Filled 2016-12-28: qty 4

## 2016-12-28 MED ORDER — CHLORHEXIDINE GLUCONATE CLOTH 2 % EX PADS
6.0000 | MEDICATED_PAD | Freq: Every day | CUTANEOUS | Status: DC
Start: 2016-12-29 — End: 2017-01-01
  Administered 2016-12-29 – 2017-01-01 (×4): 6 via TOPICAL

## 2016-12-28 MED ORDER — SODIUM CHLORIDE 0.9 % IV SOLN
INTRAVENOUS | Status: DC | PRN
  Administered 2016-12-28: 10:00:00 via INTRAVENOUS

## 2016-12-28 MED ORDER — ACETAMINOPHEN 500 MG PO TABS
1000.0000 mg | ORAL_TABLET | Freq: Four times a day (QID) | ORAL | Status: DC
  Administered 2016-12-28 – 2017-01-01 (×13): 1000 mg via ORAL
  Filled 2016-12-28 (×14): qty 2

## 2016-12-28 SURGICAL SUPPLY — 70 items
APPLIER CLIP 11 MED OPEN (CLIP)
APPLIER CLIP 13 LRG OPEN (CLIP)
BLADE CLIPPER SURG (BLADE) ×3 IMPLANT
BLADE SURG SZ10 CARB STEEL (BLADE) IMPLANT
CANISTER SUCT 1200ML W/VALVE (MISCELLANEOUS) IMPLANT
CLIP APPLIE 11 MED OPEN (CLIP) IMPLANT
CLIP APPLIE 13 LRG OPEN (CLIP) IMPLANT
DECANTER SPIKE VIAL GLASS SM (MISCELLANEOUS) ×3 IMPLANT
DEFOGGER SCOPE WARMER CLEARIFY (MISCELLANEOUS) IMPLANT
DERMABOND ADVANCED (GAUZE/BANDAGES/DRESSINGS)
DERMABOND ADVANCED .7 DNX12 (GAUZE/BANDAGES/DRESSINGS) IMPLANT
DRAPE INCISE IOBAN 66X45 STRL (DRAPES) ×3 IMPLANT
DRSG OPSITE POSTOP 4X12 (GAUZE/BANDAGES/DRESSINGS) ×3 IMPLANT
ELECT BLADE 6.5 EXT (BLADE) ×3 IMPLANT
ELECT CAUTERY BLADE 6.4 (BLADE) ×3 IMPLANT
ELECT REM PT RETURN 9FT ADLT (ELECTROSURGICAL) ×3
ELECTRODE REM PT RTRN 9FT ADLT (ELECTROSURGICAL) ×2 IMPLANT
GAUZE SPONGE 4X4 12PLY STRL (GAUZE/BANDAGES/DRESSINGS) IMPLANT
GELPORT LAPAROSCOPIC (MISCELLANEOUS) ×3 IMPLANT
GLOVE BIO SURGEON STRL SZ 6.5 (GLOVE) ×9 IMPLANT
GLOVE BIO SURGEON STRL SZ7 (GLOVE) ×6 IMPLANT
GLOVE INDICATOR 7.0 STRL GRN (GLOVE) ×12 IMPLANT
GLOVE SURG SYN 6.5 ES PF (GLOVE) ×3 IMPLANT
GLOVE SURG SYN 7.5  E (GLOVE) ×1
GLOVE SURG SYN 7.5 E (GLOVE) ×2 IMPLANT
GOWN STRL REUS W/ TWL LRG LVL3 (GOWN DISPOSABLE) ×8 IMPLANT
GOWN STRL REUS W/TWL LRG LVL3 (GOWN DISPOSABLE) ×4
HANDLE SUCTION POOLE (INSTRUMENTS) ×2 IMPLANT
HANDLE YANKAUER SUCT BULB TIP (MISCELLANEOUS) ×3 IMPLANT
HOLDER FOLEY CATH W/STRAP (MISCELLANEOUS) ×3 IMPLANT
LIGASURE IMPACT 36 18CM CVD LR (INSTRUMENTS) ×3 IMPLANT
NEEDLE HYPO 22GX1.5 SAFETY (NEEDLE) ×3 IMPLANT
NS IRRIG 1000ML POUR BTL (IV SOLUTION) ×3 IMPLANT
PACK BASIN MAJOR ARMC (MISCELLANEOUS) ×3 IMPLANT
PACK COLON CLEAN CLOSURE (MISCELLANEOUS) ×3 IMPLANT
PACK LAP CHOLECYSTECTOMY (MISCELLANEOUS) ×3 IMPLANT
PENCIL ELECTRO HAND CTR (MISCELLANEOUS) ×3 IMPLANT
RELOAD PROXIMATE 75MM BLUE (ENDOMECHANICALS) ×9 IMPLANT
RELOAD STAPLER BLUE 60MM (STAPLE) IMPLANT
RELOAD STAPLER WHITE 60MM (STAPLE) IMPLANT
RETRACTOR WOUND ALXS 18CM MED (MISCELLANEOUS) IMPLANT
RTRCTR WOUND ALEXIS O 18CM MED (MISCELLANEOUS)
SHEARS HARMONIC ACE PLUS 36CM (ENDOMECHANICALS) ×3 IMPLANT
SLEEVE ENDOPATH XCEL 5M (ENDOMECHANICALS) IMPLANT
SPONGE LAP 18X18 5 PK (GAUZE/BANDAGES/DRESSINGS) ×18 IMPLANT
STAPLE ECHEON FLEX 60 POW ENDO (STAPLE) IMPLANT
STAPLER PROXIMATE 75MM BLUE (STAPLE) ×6 IMPLANT
STAPLER RELOAD BLUE 60MM (STAPLE)
STAPLER RELOAD WHITE 60MM (STAPLE)
STAPLER SKIN PROX 35W (STAPLE) ×3 IMPLANT
SUCTION POOLE HANDLE (INSTRUMENTS) ×3
SUT MNCRL 4-0 (SUTURE) ×1
SUT MNCRL 4-0 27XMFL (SUTURE) ×2
SUT PDS AB 0 CT1 27 (SUTURE) ×6 IMPLANT
SUT PDS AB 1 TP1 54 (SUTURE) IMPLANT
SUT PROLENE 4 0 SH DA (SUTURE) ×3 IMPLANT
SUT SILK 2 0 (SUTURE) ×1
SUT SILK 2 0 SH CR/8 (SUTURE) ×3 IMPLANT
SUT SILK 2 0SH CR/8 30 (SUTURE) ×3 IMPLANT
SUT SILK 2-0 30XBRD TIE 12 (SUTURE) ×2 IMPLANT
SUT SILK 3-0 (SUTURE) ×3 IMPLANT
SUT VICRYL 0 AB UR-6 (SUTURE) ×6 IMPLANT
SUTURE MNCRL 4-0 27XMF (SUTURE) ×2 IMPLANT
SYR 30ML LL (SYRINGE) ×3 IMPLANT
SYRINGE IRR TOOMEY STRL 70CC (SYRINGE) ×3 IMPLANT
TRAY FOLEY W/METER SILVER 16FR (SET/KITS/TRAYS/PACK) ×3 IMPLANT
TROCAR XCEL 12X100 BLDLESS (ENDOMECHANICALS) IMPLANT
TROCAR XCEL BLUNT TIP 100MML (ENDOMECHANICALS) IMPLANT
TROCAR XCEL NON-BLD 5MMX100MML (ENDOMECHANICALS) IMPLANT
TUBING INSUF HEATED (TUBING) ×3 IMPLANT

## 2016-12-28 NOTE — Interval H&P Note (Signed)
History and Physical Interval Note:  12/28/2016 7:57 AM  Donald Mcdowell.  has presented today for surgery, with the diagnosis of colon cancer  The various methods of treatment have been discussed with the patient and family. After consideration of risks, benefits and other options for treatment, the patient has consented to  Procedure(s) with comments: LAPAROSCOPIC RIGHT COLECTOMY possible open (N/A) - please Open [place] and Harmonic PARTIAL COLECTOMY (N/A) as a surgical intervention .  The patient's history has been reviewed, patient examined, no change in status, stable for surgery.  I have reviewed the patient's chart and labs.  Questions were answered to the patient's satisfaction.     [place]

## 2016-12-28 NOTE — Op Note (Signed)
PROCEDURES: Open right extended hemicolectomy with a side-to-side functional end-to-end anastomosis Partial greater omentectomy  Pre-operative Diagnosis:  1. Morbid obesity 2. Near obstructing transverse colon cancer  Post-operative Diagnosis:  Same  Surgeon: Marjory Lies Jaleah Lefevre   Assistants: Dr. Genevive Bi  Anesthesia: General endotracheal anesthesia  ASA Class: 3  Surgeon: Caroleen Hamman , MD FACS  Anesthesia: Gen. with endotracheal tube   Findings: Transverse colon circumferential cancer with clinically positive nodes Anatomy very difficult for a laparoscopic procedure (pt body habitus, Thick and short mesentery) Transverse colon cancer invading into the omentum  No evidence of distant metastatic disease   Estimated Blood Loss: 300cc              Specimens: colon       Complications: none         Procedure Details  The patient was seen again in the Holding Room. The benefits, complications, treatment options, and expected outcomes were discussed with the patient. The risks of bleeding, infection, recurrence of symptoms, failure to resolve symptoms,  bowel injury, any of which could require further surgery were reviewed with the patient.   The patient was taken to Operating Room, identified as Donald Mcdowell. and the procedure verified.  A Time Out was held and the above information confirmed.  Prior to the induction of general anesthesia, antibiotic prophylaxis was administered. VTE prophylaxis was in place. General endotracheal anesthesia was then administered and tolerated well. After the induction, the abdomen was prepped with Chloraprep and draped in the sterile fashion. The patient was positioned in the supine position.  I started with a 7 cm midline incision supraumbilical using the 10 blade knife. Electrocautery was used to dissect through subcutaneous tissues tissue. Fascia was identified and elevated and incised as well as the peritoneum. The abdominal cavity was entered  under direct visualization. We were able to visualize the mass but because of his short mesentery was difficult to mobilize the greater omentum as well as the transverse colon. Given the patient's body habitus I quickly realized that this was given a B and possible case to perform laparoscopically. I went ahead and converted the case to an open procedure. Generous midline laparotomy was performed with a 10 blade knife. We identified the near obstructing circumferential mass within the transverse colon. The area was tattooed. There was also cancer extending into the greater omentum. For these reasons I went ahead and make sure that I incorporated the greater omentum within the specimen. Distally were able to obtain a 5 cm margin and created a window within the mesentery of the large bowel. Using a GIA-75 stapler we divided. Donald Mcdowell was used to dissect the plane between the transverse colon and the greater omentum. We  had difficulty with the Harmonic scalpel therefore we switched to the impact energy device. We perform a partial omentectomy using the impact device in the standard fashion.  We were able to mobilize the omentum and the transverse colon from the greater curvature of the stomach as well as from the hepatic flexure in the standard fashion. We then incised the white line of [place] from a cephalad lateral fashion. We used the electrocautery for this. The cecum was mobilized lateral to medial in the standard fashion with electrocautery. We did encounter significant adhesions around the appendix since it was attached to the pelvic wall. We were able to identify at least a 5 cm margin from the ileocecal valve. I created a window with electrocautery and divided the small bowel  with a 75 blue stapler device. He continued our dissection of the mesentery from caudal to cephalad fashion. We scored  the mesentery with electrocautery and we used the impact device to divide the mesentery. Please note that the right  colic and the middle colic pedicles were individually suture ligated in the standard fashion with 206. Were able to obtain good oncological margins and had adequate margins within both the small bowel and the transverse colon as well as within the mesentery. Upon mobilizing the right colon we visualized bleeding from some of the gastroduodenal veins and we quickly were able to obtain control. We repair the vessel using a 4-0 Prolene suture in standard fashion. Adequate hemostasis was obtained. Please note that we visualized the duodenum and protected at all times. We were not able to visualize the right ureter but we stayed very far away from it. The reason for being unable to lyse ureter was because of his anatomy he was very deep and we always stayed anterior to the iliac vessels and to the plane of the ureter. Specimen was removed in the standard fashion and a stapled anastomosis was created using a blue load 75 stapler device. Anastomosis was tested and there was no evidence of anastomotic leak and there was no tension. The edges of the anastomosis and the bowel were viable with good perfusion.  Once again we inspected the abdominal cavity with out evidence of any injuries. There was no evidence of metastatic disease within the liver or any other organs.  We changed gloves and placed a new tray to close the abdomen with a 0 PDS suture in a running fashion and the skin was closed with staplers 4-0 Monocryl. Liposomal Marcaine was injected on all incision sites under direct visualization.  Needle and laparotomy count were correct and there were no immediate complications.  Caroleen Hamman, MD, FACS

## 2016-12-28 NOTE — Anesthesia Postprocedure Evaluation (Signed)
Anesthesia Post Note  Patient: Donald Mcdowell.  Procedure(s) Performed: PARTIAL COLECTOMY RIGHT (N/A Abdomen)  Patient location during evaluation: PACU Anesthesia Type: General Level of consciousness: awake and alert and oriented Pain management: pain level controlled Vital Signs Assessment: post-procedure vital signs reviewed and stable Respiratory status: spontaneous breathing, nonlabored ventilation and respiratory function stable Cardiovascular status: blood pressure returned to baseline and stable Postop Assessment: no signs of nausea or vomiting Anesthetic complications: no     Last Vitals:  Vitals:   12/28/16 1334 12/28/16 1349  BP: (!) 125/59 (!) 132/59  Pulse: 100 (!) 101  Resp: 14 15  Temp:    SpO2: 100% 100%    Last Pain:  Vitals:   12/28/16 1349  TempSrc:   PainSc: 5                  Blayre Papania

## 2016-12-28 NOTE — Anesthesia Post-op Follow-up Note (Signed)
Anesthesia QCDR form completed.        

## 2016-12-28 NOTE — Anesthesia Preprocedure Evaluation (Signed)
Anesthesia Evaluation  Patient identified by MRN, date of birth, ID band Patient awake    Reviewed: Allergy & Precautions, NPO status , Patient's Chart, lab work & pertinent test results  History of Anesthesia Complications Negative for: history of anesthetic complications  Airway Mallampati: II  TM Distance: >3 FB Neck ROM: Full    Dental no notable dental hx.    Pulmonary neg sleep apnea, neg COPD, former smoker,    breath sounds clear to auscultation- rhonchi (-) wheezing      Cardiovascular hypertension, Pt. on medications + CAD (based on CT)  (-) Past MI, (-) Cardiac Stents and (-) CABG  Rhythm:Regular Rate:Normal - Systolic murmurs and - Diastolic murmurs Echo 27/0/35: - Left ventricle: The cavity size was mildly dilated. There was   mild concentric hypertrophy. Systolic function was normal. The   estimated ejection fraction was in the range of 55% to 60%. Wall   motion was normal; there were no regional wall motion   abnormalities. Doppler parameters are consistent with abnormal   left ventricular relaxation (grade 1 diastolic dysfunction). - Aortic valve: There was mild to moderate stenosis. Mean gradient   (S): 13 mm Hg. There is mild LVOT obstruction. Thus, calculated   aortic valve area is not accurate. - Left atrium: The atrium was mildly dilated.   Neuro/Psych negative neurological ROS  negative psych ROS   GI/Hepatic Neg liver ROS, GERD  ,  Endo/Other  diabetes, Oral Hypoglycemic Agents  Renal/GU Renal InsufficiencyRenal disease     Musculoskeletal negative musculoskeletal ROS (+)   Abdominal (+) + obese,   Peds  Hematology  (+) anemia ,   Anesthesia Other Findings Past Medical History: 09/15/2016: Abdominal aortic atherosclerosis ([place])     Comment:  CT scan June 2018 No date: Allergy No date: Cancer [facility]) 09/15/2016: Coronary artery calcification seen on CAT scan     Comment:  Previous smoker;  noted on CT scan June 2018 No date: Diabetes mellitus without complication (HCC) No date: GERD (gastroesophageal reflux disease) No date: Gout No date: Hyperlipidemia No date: Hypertension No date: Kidney function abnormal     Comment:  stage 2 No date: Kidney stones 08/20/2015: Obesity   Reproductive/Obstetrics                             Anesthesia Physical Anesthesia Plan  ASA: III  Anesthesia Plan: General   Post-op Pain Management:    Induction: Intravenous  PONV Risk Score and Plan: 1 and Ondansetron and Dexamethasone  Airway Management Planned: Oral ETT  Additional Equipment:   Intra-op Plan:   Post-operative Plan: Extubation in OR  Informed Consent: I have reviewed the patients History and Physical, chart, labs and discussed the procedure including the risks, benefits and alternatives for the proposed anesthesia with the patient or authorized representative who has indicated his/her understanding and acceptance.   Dental advisory given  Plan Discussed with: CRNA and Anesthesiologist  Anesthesia Plan Comments:         Anesthesia Quick Evaluation

## 2016-12-28 NOTE — H&P (View-Only) (Signed)
Surgical Consultation  12/02/2016  [place] Donald Mcdowell. is an 71 y.o. male.   Chief Complaint  Patient presents with  . Follow-up    discuss having surgery     HPI:  Had a recent colonoscopy and there were 11 polyps and a circumferential mass within the transverse colon. Pathology has been reviewed and there is an adenocarcinoma from the circumferential mass on the transverse colon and there is also some high-grade dysplasia within 2 polyps that were located within the sigmoid colon. Ct abd no distant mets. CT chest Left upper lobe bronchocele. He does have an appt for pulm possible bronchoscopy. He has seen medical oncology now he has stopped and is open to surgical resection. Case discussed in detail with Dr. Mike Gip. I once again have described the options of total abdominal colectomy versus an extended left hemicolectomy versus an extended right hemicolectomy. After careful consideration patient wishes to do an extended right hemicolectomy as this will decrease the chances of anastomotic leak and decrease his morbidity. He is not willing to go a major turn abdominal colectomy. He does understand that a right extended hemicolectomy will not address the high-grade dysplasia that in ideal circumstances will require a radical resection. He understands and wishes to undergo surveillance colonoscopy for dose sigmoid lesions.  Past Medical History:  Diagnosis Date  . Abdominal aortic atherosclerosis ([place]) 09/15/2016   CT scan June 2018  . Allergy   . Cancer ([place])   . Coronary artery calcification seen on CAT scan 09/15/2016   Previous smoker; noted on CT scan June 2018  . Diabetes mellitus without complication ([place])   . Hyperlipidemia   . Hypertension   . Kidney stones   . Obesity 08/20/2015    Past Surgical History:  Procedure Laterality Date  . COLONOSCOPY WITH PROPOFOL N/A 11/05/2016   Procedure: COLONOSCOPY WITH PROPOFOL;  Surgeon: Lucilla Lame, MD;  Location: [place];   Service: Gastroenterology;  Laterality: N/A;  . ESOPHAGOGASTRODUODENOSCOPY (EGD) WITH PROPOFOL N/A 11/05/2016   Procedure: ESOPHAGOGASTRODUODENOSCOPY (EGD) WITH PROPOFOL;  Surgeon: Lucilla Lame, MD;  Location: [place];  Service: Gastroenterology;  Laterality: N/A;  Diabetic - oral meds  . kidney stones    . POLYPECTOMY N/A 11/05/2016   Procedure: POLYPECTOMY;  Surgeon: Lucilla Lame, MD;  Location: [place];  Service: Gastroenterology;  Laterality: N/A;    Family History  Problem Relation Age of Onset  . Alzheimer's disease Mother   . Heart disease Father   . Diabetes Father   . Cancer Father        Prostate  . Cancer Sister        skin cancer    Social History:  reports that he quit smoking about 5 months ago. His smoking use included Cigarettes. He has never used smokeless tobacco. He reports that he does not drink alcohol or use drugs.  Allergies: No Known Allergies  Medications reviewed.     ROS Full ROS performed and is otherwise negative other than what is stated in the HPI    BP (!) 145/68   Pulse 84   Temp (!) 97.3 F (36.3 C) (Oral)   Ht 6\' 1"  (1.854 m)   Wt 125.4 kg (276 lb 8 oz)   BMI 36.48 kg/m   Physical Exam  Constitutional: He is oriented to person, place, and time and well-developed, well-nourished, and in no distress. No distress.  Neck: No JVD present.  Cardiovascular: Normal rate and intact distal pulses.   Pulmonary/Chest: Effort  normal. No stridor. No respiratory distress. He exhibits no tenderness.  Abdominal: Soft. He exhibits no distension. There is no tenderness. There is no rebound and no guarding.  Neurological: He is alert and oriented to person, place, and time. Gait normal. GCS score is 15.  Skin: Skin is warm and dry. He is not diaphoretic.  Psychiatric: Mood, memory, affect and judgment normal.      No results found for this or any previous visit (from the past 48 hour(s)). No results  found.  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 in addition with high grade dysplasia on 2 polyps in the sigmoid colon. He has decided on proceeding with an extended right hemicolectomy with primary anastomosis and surveillance of the sigmoid lesions. I've also discussed with medical oncology and we'll agreed that this is the least invasive form to continue with his current cancer. I have described with the patient in detail about the procedure, risks, benefits and possible complications including but not limited to: Bleeding, anastomotic leak, infection, re-interventions, anesthesia complications and conversion to open. Plan for laparoscopic extended right hemicolectomy possible open. I spent more than 45 minutes in this encounter with greater than 50% of the time spent in counseling    Caroleen Hamman, MD [place] Surgeon

## 2016-12-28 NOTE — Transfer of Care (Signed)
Immediate Anesthesia Transfer of Care Note  Patient: Donald Mcdowell.  Procedure(s) Performed: Procedure(s): PARTIAL COLECTOMY RIGHT (N/A)  Patient Location: PACU  Anesthesia Type:General  Level of Consciousness: sedated  Airway & Oxygen Therapy: Patient Spontanous Breathing and Patient connected to face mask oxygen  Post-op Assessment: Report given to RN and Post -op Vital signs reviewed and stable  Post vital signs: Reviewed and stable  Last Vitals:  Vitals:   12/28/16 0801 12/28/16 1319  BP: (!) 149/59 (!) 105/54  Pulse: 75 (!) 103  Resp: 18 16  Temp: 36.7 C (P) 36.4 C  SpO2: 161% 096%    Complications: No apparent anesthesia complications

## 2016-12-28 NOTE — Anesthesia Procedure Notes (Signed)
Procedure Name: Intubation Date/Time: 12/28/2016 9:32 AM Performed by: Doreen Salvage Pre-anesthesia Checklist: Patient identified, Patient being monitored, Timeout performed, Emergency Drugs available and Suction available Patient Re-evaluated:Patient Re-evaluated prior to induction Oxygen Delivery Method: Circle system utilized Preoxygenation: Pre-oxygenation with 100% oxygen Induction Type: IV induction Ventilation: Mask ventilation without difficulty Laryngoscope Size: Mac and 3 Grade View: Grade I Tube type: Oral Tube size: 7.5 mm Number of attempts: 1 Airway Equipment and Method: Stylet Placement Confirmation: ETT inserted through vocal cords under direct vision,  positive ETCO2 and breath sounds checked- equal and bilateral Secured at: 23 cm Tube secured with: Tape Dental Injury: Teeth and Oropharynx as per pre-operative assessment

## 2016-12-29 ENCOUNTER — Ambulatory Visit: Payer: Medicare Other | Admitting: Family Medicine

## 2016-12-29 ENCOUNTER — Encounter: Payer: Self-pay | Admitting: Surgery

## 2016-12-29 ENCOUNTER — Telehealth: Payer: Self-pay

## 2016-12-29 LAB — GLUCOSE, CAPILLARY
GLUCOSE-CAPILLARY: 163 mg/dL — AB (ref 65–99)
GLUCOSE-CAPILLARY: 135 mg/dL — AB (ref 65–99)
GLUCOSE-CAPILLARY: 115 mg/dL — AB (ref 65–99)
Glucose-Capillary: 202 mg/dL — ABNORMAL HIGH (ref 65–99)
Glucose-Capillary: 133 mg/dL — ABNORMAL HIGH (ref 65–99)

## 2016-12-29 LAB — BASIC METABOLIC PANEL
Anion gap: 4 — ABNORMAL LOW (ref 5–15)
BUN: 18 mg/dL (ref 6–20)
CHLORIDE: 108 mmol/L (ref 101–111)
CO2: 24 mmol/L (ref 22–32)
CREATININE: 1.08 mg/dL (ref 0.61–1.24)
Calcium: 8 mg/dL — ABNORMAL LOW (ref 8.9–10.3)
GFR calc Af Amer: >60 mL/min (ref >60)
GFR calc non Af Amer: >60 mL/min (ref >60)
Glucose, Bld: 248 mg/dL — ABNORMAL HIGH (ref 65–99)
Potassium: 4.5 mmol/L (ref 3.5–5.1)
Sodium: 136 mmol/L (ref 135–145)

## 2016-12-29 LAB — TYPE AND SCREEN
ABO/RH(D): A POS
ANTIBODY SCREEN: NEGATIVE
UNIT DIVISION: 0
Unit division: 0

## 2016-12-29 LAB — BPAM RBC
BLOOD PRODUCT EXPIRATION DATE: 201810302359
Blood Product Expiration Date: 201810302359
ISSUE DATE / TIME: 201810151148
ISSUE DATE / TIME: 201810151148
UNIT TYPE AND RH: 6200
Unit Type and Rh: 6200

## 2016-12-29 LAB — CBC
HEMATOCRIT: 29 % — AB (ref 40.0–52.0)
Hemoglobin: 9.5 g/dL — ABNORMAL LOW (ref 13.0–18.0)
MCH: 31 pg (ref 26.0–34.0)
MCHC: 32.8 g/dL (ref 32.0–36.0)
MCV: 94.3 fL (ref 80.0–100.0)
Platelets: 166 10*3/uL (ref 150–440)
RBC: 3.07 MIL/uL — ABNORMAL LOW (ref 4.40–5.90)
RDW: 14.3 % (ref 11.5–14.5)
WBC: 9.2 10*3/uL (ref 3.8–10.6)

## 2016-12-29 LAB — MAGNESIUM: Magnesium: 1.9 mg/dL (ref 1.7–2.4)

## 2016-12-29 LAB — PREPARE RBC (CROSSMATCH)

## 2016-12-29 NOTE — Telephone Encounter (Signed)
Wife came by to let you know husband had surgery and is doing fine, he will be in the hospital for 5-7 days in room 221.  Also wanted to let you know they found cancer in lungs as well but can be treated with radiation.

## 2016-12-29 NOTE — Progress Notes (Signed)
Inpatient Diabetes Program Recommendations  AACE/ADA: New Consensus Statement on Inpatient Glycemic Control (2015)  Target Ranges:  Prepandial:   less than 140 mg/dL      Peak postprandial:   less than 180 mg/dL (1-2 hours)      Critically ill patients:  140 - 180 mg/dL   Lab Results  Component Value Date   GLUCAP 202 (H) 12/29/2016   HGBA1C 5.9 (H) 08/28/2016    Review of Glycemic Control  Results for Donald Mcdowell, Donald Mcdowell (MRN [id number]) as of 12/29/2016 09:30  Ref. Range 12/28/2016 08:14 12/28/2016 13:44 12/28/2016 16:50 12/28/2016 21:09 12/29/2016 07:43  Glucose-Capillary Latest Ref Range: 65 - 99 mg/dL 153 (H) 234 (H) 257 (H) 291 (H) 202 (H)    Diabetes history: Type 2 Outpatient Diabetes medications: Glipizide 10mg  bid, Metformin 500mg  bid,  Current orders for Inpatient glycemic control: Novolog 0-9 units tid, Novolog 0-5 units qhs, Novolog 4 units tid  Inpatient Diabetes Program Recommendations:  Consider increasing Novolog correction to moderate correction (0-15 units) tid.  Gentry Fitz, RN, BA, MHA, CDE Diabetes Coordinator Inpatient Diabetes Program  [phone] (Team Pager) [phone] ([place]) 12/29/2016 9:34 AM

## 2016-12-29 NOTE — Telephone Encounter (Signed)
Thank you so much; I'll stop by to visit

## 2016-12-29 NOTE — Progress Notes (Signed)
POD # 1 AVSS Hb dropped from blood loss and HD HAd some bleeding from the wound after valsalva Taking clears  PE : NAD Abd: some clot within the dressing. Dresing removed and changed. No active bleeding or expanding hematoma. Incisions c/d/i  A/P Doing well We will hold lovenox and NSAID Ambulate DC foley Decrease IVF

## 2016-12-29 NOTE — Progress Notes (Signed)
At 0545 staff assisted patient to BR. While patient was sitting on commode he increased abdominal pressure feeling urge to urinate from foley catheter. Patient bled large amount  at abdominal dressing, onto floor. Staff called additional staff, assisted patient to bed. Pressure placed while walking as well as when pt laid back into bed with towels. Nurse spoke with Dr Burt Knack who instructed to placed abd pads and abdominal binder, which staff did immediately. VS taken twice- remained stable. Patient did not have any dizziness, not symptomatic during episode nor while nurse remained with patient. PA came in- nurse examined abd with PA, some bleeding through one abd pad under binder,  described what had occurred. PA said she would be seeing Dr Dahlia Byes, nurse asked PA to inquire if any labs to be ordered. Report given to [place], RN- abdomen examined with Anabella- unchanged since visualized with PA.

## 2016-12-30 LAB — CBC
HCT: 28.3 % — ABNORMAL LOW (ref 40.0–52.0)
HEMOGLOBIN: 9.4 g/dL — AB (ref 13.0–18.0)
MCH: 31.2 pg (ref 26.0–34.0)
MCHC: 33.2 g/dL (ref 32.0–36.0)
MCV: 93.8 fL (ref 80.0–100.0)
PLATELETS: 167 10*3/uL (ref 150–440)
RBC: 3.02 MIL/uL — AB (ref 4.40–5.90)
RDW: 14.4 % (ref 11.5–14.5)
WBC: 7.2 10*3/uL (ref 3.8–10.6)

## 2016-12-30 LAB — BASIC METABOLIC PANEL
ANION GAP: 4 — AB (ref 5–15)
BUN: 14 mg/dL (ref 6–20)
CHLORIDE: 109 mmol/L (ref 101–111)
CO2: 27 mmol/L (ref 22–32)
Calcium: 8.5 mg/dL — ABNORMAL LOW (ref 8.9–10.3)
Creatinine, Ser: 1.05 mg/dL (ref 0.61–1.24)
GFR calc Af Amer: >60 mL/min (ref >60)
GLUCOSE: 162 mg/dL — AB (ref 65–99)
POTASSIUM: 4.1 mmol/L (ref 3.5–5.1)
Sodium: 140 mmol/L (ref 135–145)

## 2016-12-30 LAB — GLUCOSE, CAPILLARY
GLUCOSE-CAPILLARY: 148 mg/dL — AB (ref 65–99)
GLUCOSE-CAPILLARY: 228 mg/dL — AB (ref 65–99)
Glucose-Capillary: 142 mg/dL — ABNORMAL HIGH (ref 65–99)
Glucose-Capillary: 161 mg/dL — ABNORMAL HIGH (ref 65–99)
Glucose-Capillary: 152 mg/dL — ABNORMAL HIGH (ref 65–99)

## 2016-12-30 LAB — SURGICAL PATHOLOGY

## 2016-12-30 NOTE — Progress Notes (Signed)
POD # 2 AVSS Passing flatus, taking clears Hb stable ( anemia acute on chronic from blood loss, hemodilution and from bleeding colon cancer)  PE NAD Abd: soft, incisions c/d/i. No bleeding  A/P Doing well Path pending Advance diet slowly mobilize

## 2016-12-31 LAB — GLUCOSE, CAPILLARY
GLUCOSE-CAPILLARY: 169 mg/dL — AB (ref 65–99)
GLUCOSE-CAPILLARY: 161 mg/dL — AB (ref 65–99)
GLUCOSE-CAPILLARY: 183 mg/dL — AB (ref 65–99)
Glucose-Capillary: 158 mg/dL — ABNORMAL HIGH (ref 65–99)

## 2016-12-31 MED ORDER — GLIPIZIDE ER 10 MG PO TB24
10.0000 mg | ORAL_TABLET | Freq: Every day | ORAL | Status: DC
  Administered 2016-12-31 – 2017-01-01 (×2): 10 mg via ORAL
  Filled 2016-12-31 (×2): qty 1

## 2016-12-31 MED ORDER — METFORMIN HCL 500 MG PO TABS
500.0000 mg | ORAL_TABLET | Freq: Two times a day (BID) | ORAL | Status: DC
  Administered 2016-12-31 – 2017-01-01 (×3): 500 mg via ORAL
  Filled 2016-12-31 (×3): qty 1

## 2016-12-31 NOTE — Progress Notes (Signed)
Verbal order to discontinue meal coverage of Novolog 4 units due restarting patients Glipizide and Metformin PO. Will continue to monitor pt.   Donald Mcdowell [employer]

## 2016-12-31 NOTE — Progress Notes (Addendum)
POD # 3  Doing well  Path d/w pt in detail +5 out of 18 nodes positive Taking diet Pain controlled + flatus  PE NAD Abd: soft, no bleeding or infection  A/P Doing well Mobilize Regular diet Start Po hypoglycemics DC in am

## 2017-01-01 LAB — GLUCOSE, CAPILLARY: Glucose-Capillary: 139 mg/dL — ABNORMAL HIGH (ref 65–99)

## 2017-01-01 MED ORDER — HYDROCODONE-ACETAMINOPHEN 5-325 MG PO TABS: 1.0000 | ORAL_TABLET | Freq: Four times a day (QID) | ORAL | 0 refills | Status: DC | PRN

## 2017-01-01 NOTE — Discharge Summary (Signed)
Patient ID: Donald Mcdowell. MRN: [id number] DOB/AGE: 1946/02/17 71 y.o.  Admit date: 12/28/2016 Discharge date: 01/01/2017   Discharge Diagnoses:  Active Problems:   Colon cancer [facility])   Procedures Right extended hemicolectomy  Hospital Course:  This is a 71 yo morbidly obese male with a known history of transverse colon cancer underwent an uneventful right extended hemicolectomy. Did well postoperatively without any complications. At the time of discharge he was ambulating, tolerating regular diet and his vital signs were stable. He did have a couple grams drop in hemoglobin that were likely from blood loss and hemodilution. Did not require any blood transfusions. Physical exam at the time of discharge in no acute distress. Awake alert. Abdomen : Sttaples in place no evidence of bleeding infection. No peritonitis. Condition at the time of discharge is stable   Disposition: 01-Home or Self Care  Discharge Instructions    Call MD for:  difficulty breathing, headache or visual disturbances    Complete by:  As directed    Call MD for:  extreme fatigue    Complete by:  As directed    Call MD for:  hives    Complete by:  As directed    Call MD for:  persistant dizziness or light-headedness    Complete by:  As directed    Call MD for:  persistant nausea and vomiting    Complete by:  As directed    Call MD for:  redness, tenderness, or signs of infection (pain, swelling, redness, odor or green/yellow discharge around incision site)    Complete by:  As directed    Call MD for:  severe uncontrolled pain    Complete by:  As directed    Call MD for:  temperature >100.4    Complete by:  As directed    Diet - low sodium heart healthy    Complete by:  As directed    Increase activity slowly    Complete by:  As directed    Lifting restrictions    Complete by:  As directed    20 lbs x 6 wks     Allergies as of 01/01/2017   No Known Allergies     Medication List    TAKE  these medications   allopurinol 300 MG tablet Commonly known as:  ZYLOPRIM Take 1 tablet (300 mg total) by mouth daily.   erythromycin base 500 MG tablet Commonly known as:  E-MYCIN Please see bowel prep instructions   Fish Oil 1200 MG Caps Take 1,200 mg by mouth 2 (two) times daily.   glipiZIDE 10 MG 24 hr tablet Commonly known as:  GLUCOTROL XL Take 1 tablet (10 mg total) by mouth 2 (two) times daily.   glucose blood test strip Commonly known as:  ACCU-CHEK COMPACT STRIPS 100 each by Other route as needed for other. Use as instructed, check FSBS once a day; LON 99 months, E11.9   HYDROcodone-acetaminophen 5-325 MG tablet Commonly known as:  NORCO/VICODIN Take 1-2 tablets by mouth every 6 (six) hours as needed for moderate pain.   IRON 100/C PO Take 65 mg by mouth daily after breakfast. 2 HOURS  AFTER BREAKFAST   Magnesium Oxide 250 MG Tabs Take 250 mg by mouth daily.   metFORMIN 1000 MG tablet Commonly known as:  GLUCOPHAGE TAKE 1/2 (ONE-HALF) TABLET BY MOUTH TWICE DAILY   mupirocin nasal ointment 2 % Commonly known as:  BACTROBAN NASAL Place 1 application into the nose 2 (two) times daily. Use  one-half of tube in each nostril twice daily for five (5) days. After application, press sides of nose together and gently massage.   neomycin 500 MG tablet Commonly known as:  MYCIFRADIN Please see bowel prep instructions   polyethylene glycol powder powder Commonly known as:  GLYCOLAX/MIRALAX Please see bowel prep instructions   quinapril 5 MG tablet Commonly known as:  ACCUPRIL Take 1 tablet (5 mg total) by mouth daily.   rosuvastatin 20 MG tablet Commonly known as:  CRESTOR Take 0.5 tablets (10 mg total) by mouth daily. What changed:  when to take this  additional instructions   tamsulosin 0.4 MG Caps capsule Commonly known as:  FLOMAX Take 0.4 mg by mouth daily.   vitamin B-12 100 MCG tablet Commonly known as:  CYANOCOBALAMIN Take 100 mcg by mouth  daily.      Follow-up Information    Jules Husbands, MD. Go on 01/08/2017.   Specialty:  General Surgery Why:  Friday at 9:30am for hospital follow-up Contact information: [address] [place] [address] [phone]            Caroleen Hamman, MD FACS

## 2017-01-01 NOTE — Progress Notes (Signed)
IV was removed. Discharge instructions, follow-up appointments, and prescriptions were provided to the pt and wife at bedside. All questions  Were answered. The pt was taken downstairs via wheelchair by volunteers.

## 2017-01-04 ENCOUNTER — Telehealth: Payer: Self-pay

## 2017-01-04 ENCOUNTER — Institutional Professional Consult (permissible substitution): Payer: Medicare Other | Admitting: Radiation Oncology

## 2017-01-04 NOTE — Telephone Encounter (Signed)
Patient's daughter called in at this time to get an understanding of pathology results. I reviewed the pathology with her over the phone. All questions were answered to the best of my ability. She verbalized understanding.  Reviewed patient's post-op appointment information with patient's daughter as well.

## 2017-01-04 NOTE — Telephone Encounter (Signed)
Post-op call made to patient at this time. Spoke with patient. Post-op interview questions below.  1. How are you feeling? good  2. Is your pain controlled? Not having any pain   3. What are you doing for the pain? Not taking anything  4. Are you having any Nausea or Vomiting? no  5. Are you having any Fever or Chills? no  6. Are you having any Constipation or Diarrhea? Having bowel movements and taking Miralax when needed.  7. Is there any Swelling or Bruising you are concerned about? no  8. Do you have any questions or concerns at this time? no   Discussion: Post op appointment reviewed and patient verbalized understanding.

## 2017-01-08 ENCOUNTER — Ambulatory Visit (INDEPENDENT_AMBULATORY_CARE_PROVIDER_SITE_OTHER): Payer: Medicare Other | Admitting: Surgery

## 2017-01-08 ENCOUNTER — Encounter: Payer: Self-pay | Admitting: Surgery

## 2017-01-08 DIAGNOSIS — Z09 Encounter for follow-up examination after completed treatment for conditions other than malignant neoplasm: Secondary | ICD-10-CM

## 2017-01-08 NOTE — Addendum Note (Signed)
Addended by: Caroleen Hamman F on: 01/08/2017 11:07 AM   Modules accepted: Orders, SmartSet

## 2017-01-08 NOTE — Progress Notes (Signed)
S/p extended right hemicolectomy Doing very well No abd pain + PO  Path d/w pt in detail + nodes  PE NAD Abd: staples in place, removed. No infection or peritonitis   A/P Adenoca T colon w met disease to Lymph node D/W pt in detail F/U oncology next week Depending on his wishes and Onc recommendation we will decide on port placement, we will be available for that. No complications from his recent surgery

## 2017-01-11 ENCOUNTER — Telehealth: Payer: Self-pay | Admitting: Surgery

## 2017-01-11 ENCOUNTER — Telehealth: Payer: Self-pay | Admitting: *Deleted

## 2017-01-11 NOTE — Telephone Encounter (Signed)
Pt advised of pre op date/time and sx date. Sx: 01/18/17 with Dr Jeremy Johann Placement. Pre op: 01/14/17 between 9-1:00pm--Phone interview.   Patient made aware to call [phone], between 1-3:00pm the day before surgery, to find out what time to arrive.

## 2017-01-11 NOTE — Telephone Encounter (Signed)
Pt's caregiver, [name], called to ask if pt still needs to see Dr. Baruch Gouty for SBRT treatment for lung ca since will require more treatment for colon cancer. Pt has appt scheduled for 11/9 and didn't know if should cancel at this time and if SBRT treatments would be given concurrently. Please advise.

## 2017-01-11 NOTE — Telephone Encounter (Signed)
  We need to see him to sort all of this out.  We have not seen him since his surgery.  I will need to talk to Dr Baruch Gouty about the timing.  M

## 2017-01-12 NOTE — Telephone Encounter (Signed)
Donald Mcdowell informed that will keep appts as scheduled as of now. Instructed to call back with questions.

## 2017-01-14 ENCOUNTER — Encounter
Admission: RE | Admit: 2017-01-14 | Discharge: 2017-01-14 | Disposition: A | Discharge status: Home / Self Care | Payer: Medicare Other | Source: Ambulatory Visit | Attending: Surgery | Admitting: Surgery

## 2017-01-14 ENCOUNTER — Inpatient Hospital Stay (HOSPITAL_BASED_OUTPATIENT_CLINIC_OR_DEPARTMENT_OTHER): Payer: Medicare Other | Admitting: Hematology and Oncology

## 2017-01-14 ENCOUNTER — Inpatient Hospital Stay: Payer: Medicare Other | Attending: Hematology and Oncology

## 2017-01-14 ENCOUNTER — Other Ambulatory Visit: Payer: Self-pay | Admitting: *Deleted

## 2017-01-14 ENCOUNTER — Other Ambulatory Visit: Payer: Self-pay | Admitting: Hematology and Oncology

## 2017-01-14 VITALS — BP 133/71 | HR 83 | Temp 97.6°F | Resp 18 | Wt 267.4 lb

## 2017-01-14 DIAGNOSIS — E669 Obesity, unspecified: Secondary | ICD-10-CM | POA: Insufficient documentation

## 2017-01-14 DIAGNOSIS — M109 Gout, unspecified: Secondary | ICD-10-CM | POA: Insufficient documentation

## 2017-01-14 DIAGNOSIS — Z87442 Personal history of urinary calculi: Secondary | ICD-10-CM | POA: Diagnosis not present

## 2017-01-14 DIAGNOSIS — I10 Essential (primary) hypertension: Secondary | ICD-10-CM | POA: Diagnosis not present

## 2017-01-14 DIAGNOSIS — Z79899 Other long term (current) drug therapy: Secondary | ICD-10-CM | POA: Diagnosis not present

## 2017-01-14 DIAGNOSIS — D509 Iron deficiency anemia, unspecified: Secondary | ICD-10-CM | POA: Insufficient documentation

## 2017-01-14 DIAGNOSIS — R197 Diarrhea, unspecified: Secondary | ICD-10-CM | POA: Diagnosis not present

## 2017-01-14 DIAGNOSIS — C184 Malignant neoplasm of transverse colon: Secondary | ICD-10-CM | POA: Insufficient documentation

## 2017-01-14 DIAGNOSIS — E785 Hyperlipidemia, unspecified: Secondary | ICD-10-CM | POA: Insufficient documentation

## 2017-01-14 DIAGNOSIS — K2970 Gastritis, unspecified, without bleeding: Secondary | ICD-10-CM

## 2017-01-14 DIAGNOSIS — I2510 Atherosclerotic heart disease of native coronary artery without angina pectoris: Secondary | ICD-10-CM | POA: Insufficient documentation

## 2017-01-14 DIAGNOSIS — C778 Secondary and unspecified malignant neoplasm of lymph nodes of multiple regions: Secondary | ICD-10-CM

## 2017-01-14 DIAGNOSIS — E119 Type 2 diabetes mellitus without complications: Secondary | ICD-10-CM | POA: Insufficient documentation

## 2017-01-14 DIAGNOSIS — Z01818 Encounter for other preprocedural examination: Secondary | ICD-10-CM | POA: Insufficient documentation

## 2017-01-14 DIAGNOSIS — C3412 Malignant neoplasm of upper lobe, left bronchus or lung: Secondary | ICD-10-CM | POA: Insufficient documentation

## 2017-01-14 DIAGNOSIS — Z7189 Other specified counseling: Secondary | ICD-10-CM

## 2017-01-14 DIAGNOSIS — C189 Malignant neoplasm of colon, unspecified: Secondary | ICD-10-CM

## 2017-01-14 DIAGNOSIS — K219 Gastro-esophageal reflux disease without esophagitis: Secondary | ICD-10-CM | POA: Diagnosis not present

## 2017-01-14 DIAGNOSIS — Z9049 Acquired absence of other specified parts of digestive tract: Secondary | ICD-10-CM | POA: Diagnosis not present

## 2017-01-14 DIAGNOSIS — Z7984 Long term (current) use of oral hypoglycemic drugs: Secondary | ICD-10-CM | POA: Insufficient documentation

## 2017-01-14 DIAGNOSIS — N400 Enlarged prostate without lower urinary tract symptoms: Secondary | ICD-10-CM

## 2017-01-14 DIAGNOSIS — F17210 Nicotine dependence, cigarettes, uncomplicated: Secondary | ICD-10-CM

## 2017-01-14 LAB — CBC WITH DIFFERENTIAL/PLATELET
Basophils Absolute: 0.1 10*3/uL (ref 0–0.1)
Basophils Relative: 1 %
Eosinophils Absolute: 0.2 10*3/uL (ref 0–0.7)
Eosinophils Relative: 4 %
HCT: 33.7 % — ABNORMAL LOW (ref 40.0–52.0)
Hemoglobin: 10.9 g/dL — ABNORMAL LOW (ref 13.0–18.0)
Lymphocytes Relative: 30 %
Lymphs Abs: 1.5 10*3/uL (ref 1.0–3.6)
MCH: 29.4 pg (ref 26.0–34.0)
MCHC: 32.5 g/dL (ref 32.0–36.0)
MCV: 90.5 fL (ref 80.0–100.0)
Monocytes Absolute: 0.3 10*3/uL (ref 0.2–1.0)
Monocytes Relative: 7 %
Neutro Abs: 2.8 10*3/uL (ref 1.4–6.5)
Neutrophils Relative %: 58 %
Platelets: 355 10*3/uL (ref 150–440)
RBC: 3.72 MIL/uL — ABNORMAL LOW (ref 4.40–5.90)
RDW: 15.2 % — ABNORMAL HIGH (ref 11.5–14.5)
WBC: 4.9 10*3/uL (ref 3.8–10.6)

## 2017-01-14 LAB — COMPREHENSIVE METABOLIC PANEL
ALT: 35 U/L (ref 17–63)
AST: 30 U/L (ref 15–41)
Albumin: 3.8 g/dL (ref 3.5–5.0)
Alkaline Phosphatase: 57 U/L (ref 38–126)
Anion gap: 8 (ref 5–15)
BUN: 21 mg/dL — ABNORMAL HIGH (ref 6–20)
CO2: 27 mmol/L (ref 22–32)
Calcium: 9.5 mg/dL (ref 8.9–10.3)
Chloride: 103 mmol/L (ref 101–111)
Creatinine, Ser: 0.97 mg/dL (ref 0.61–1.24)
GFR calc Af Amer: >60 mL/min (ref >60)
GFR calc non Af Amer: >60 mL/min (ref >60)
Glucose, Bld: 174 mg/dL — ABNORMAL HIGH (ref 65–99)
Potassium: 4.3 mmol/L (ref 3.5–5.1)
Sodium: 138 mmol/L (ref 135–145)
Total Bilirubin: 0.4 mg/dL (ref 0.3–1.2)
Total Protein: 7.2 g/dL (ref 6.5–8.1)

## 2017-01-14 LAB — IRON AND TIBC
Iron: 163 ug/dL (ref 45–182)
Saturation Ratios: 50 % — ABNORMAL HIGH (ref 17.9–39.5)
TIBC: 325 ug/dL (ref 250–450)
UIBC: 162 ug/dL

## 2017-01-14 LAB — SURGICAL PCR SCREEN
MRSA, PCR: NEGATIVE
STAPHYLOCOCCUS AUREUS: NEGATIVE

## 2017-01-14 LAB — FERRITIN: Ferritin: 15 ng/mL — ABNORMAL LOW (ref 24–336)

## 2017-01-14 NOTE — Patient Instructions (Signed)
  Your procedure is scheduled on: 01-18-17 MONDAY Report to Same Day Surgery 2nd floor medical mall [facility] Entrance-take elevator on left to 2nd floor.  Check in with surgery information desk.) To find out your arrival time please call [phone] between 1PM - 3PM on 01-17-17 THURSDAY  Remember: Instructions that are not followed completely may result in serious medical risk, up to and including death, or upon the discretion of your surgeon and anesthesiologist your surgery may need to be rescheduled.    _x___ 1. Do not eat food after midnight the night before your procedure. NO GUM CHEWING OR CANDY AFTER MIDNIGHT.  You may drink WATER up to 2 hours before you are scheduled to arrive at the hospital for your procedure.  Do not drink clear liquids within 2 hours of your scheduled arrival to the hospital.  Type 1 and type 2 diabetics should only drink water. .     __x__ 2. No Alcohol for 24 hours before or after surgery.   __x__3. No Smoking for 24 prior to surgery.   ____  4. Bring all medications with you on the day of surgery if instructed.    __x__ 5. Notify your doctor if there is any change in your medical condition     (cold, fever, infections).     Do not wear jewelry, make-up, hairpins, clips or nail polish.  Do not wear lotions, powders, or perfumes. You may wear deodorant.  Do not shave 48 hours prior to surgery. Men may shave face and neck.  Do not bring valuables to the hospital.    [facility] is not responsible for any belongings or valuables.               Contacts, dentures or bridgework may not be worn into surgery.  Leave your suitcase in the car. After surgery it may be brought to your room.  For patients admitted to the hospital, discharge time is determined by your treatment team.   Patients discharged the day of surgery will not be allowed to drive home.  You will need someone to drive you home and stay with you the night of your procedure.    Please read  over the following fact sheets that you were given:   [facility] Preparing for Surgery and or MRSA Information   _x___ TAKE THE FOLLOWING MEDICATIONS THE MORNING OF SURGERY WITH A SMALL SIP OF WATER. These include:  1. MAGNESIUM OXIDE  2.  3.  4.  5.  6.  ____Fleets enema or Magnesium Citrate as directed.   _x___ Use CHG Soap or sage wipes as directed on instruction sheet   ____ Use inhalers on the day of surgery and bring to hospital day of surgery  __X__ Stop Metformin days prior to surgery-LAST DOSE OF METFORMIN ON Friday, November 2ND   ____ Take 1/2 of usual insulin dose the night before surgery and none on the morning surgery.   ____ Follow recommendations from Cardiologist, Pulmonologist or PCP regarding stopping Aspirin, Coumadin, Plavix ,Eliquis, Effient, or Pradaxa, and Pletal.  X____Stop Anti-inflammatories such as Advil, Aleve, Ibuprofen, Motrin, Naproxen, Naprosyn, Goodies powders or aspirin products NOW-OK to take Tylenol   _x___ Stop supplements until after surgery-STOP FISH OIL NOW   ____ Bring C-Pap to the hospital.

## 2017-01-14 NOTE — Progress Notes (Signed)
Per verbal order, MD recommends patient increase his ferrous sulfate to two tablets a day to be taken with OJ or Vitamin C for better absorption. Patient verbalized understanding.

## 2017-01-14 NOTE — Pre-Procedure Instructions (Signed)
Progress Notes Encounter Date: 12/15/2016 Wellington Hampshire, MD  Cardiology  Expand All Collapse All   [] Hide copied text [] Hover for attribution information     Cardiology Office Note   Date:  12/15/2016   ID:  Donald Pou., DOB 1945-06-13, MRN [id number]  PCP:  Flora Lipps, MD          Cardiologist:   Kathlyn Sacramento, MD       Chief Complaint  Patient presents with  . other    New patient. surgerical clearance. Meds reviewed verbally with patient.       History of Present Illness: Donald Mcdowell. is a 71 y.o. male who Was referred by Dr. Sanda Klein for preoperative cardiovascular evaluation before undergoing colectomy and bronchoscopy. He was recently diagnosed with colon cancer. The patient has no prior cardiac history. He has chronic medical conditions that include prolonged history of diabetes mellitus, hypertension, hyperlipidemia, morbid obesity and previous tobacco use. He has no family history of premature coronary artery disease although his father did have myocardial infarction in his 74s. The patient was recently diagnosed with colon cancer and also was diagnosed with lung nodule. He is supposed to have bronchoscopy done tomorrow and colectomy done in the middle of the month. The patient denies any chest pain or shortness of breath. He is able to walk 1 mile daily for exercise. He does that and about 25 minutes. He does not feel limited with doing that. He is not aware of a cardiac murmur.         Past Medical History:  Diagnosis Date  . Abdominal aortic atherosclerosis ([place]) 09/15/2016   CT scan June 2018  . Allergy   . Cancer ([place])   . Coronary artery calcification seen on CAT scan 09/15/2016   Previous smoker; noted on CT scan June 2018  . Diabetes mellitus without complication ([place])   . GERD (gastroesophageal reflux disease)   . Gout   . Hyperlipidemia   . Hypertension   . Kidney function abnormal    stage 2  . Kidney stones    . Obesity 08/20/2015         Past Surgical History:  Procedure Laterality Date  . COLONOSCOPY WITH PROPOFOL N/A 11/05/2016   Procedure: COLONOSCOPY WITH PROPOFOL;  Surgeon: Lucilla Lame, MD;  Location: [place];  Service: Gastroenterology;  Laterality: N/A;  . ESOPHAGOGASTRODUODENOSCOPY (EGD) WITH PROPOFOL N/A 11/05/2016   Procedure: ESOPHAGOGASTRODUODENOSCOPY (EGD) WITH PROPOFOL;  Surgeon: Lucilla Lame, MD;  Location: [place];  Service: Gastroenterology;  Laterality: N/A;  Diabetic - oral meds  . kidney stones    . POLYPECTOMY N/A 11/05/2016   Procedure: POLYPECTOMY;  Surgeon: Lucilla Lame, MD;  Location: [place];  Service: Gastroenterology;  Laterality: N/A;           Current Outpatient Prescriptions  Medication Sig Dispense Refill  . allopurinol (ZYLOPRIM) 300 MG tablet Take 1 tablet (300 mg total) by mouth daily. 90 tablet 3  . erythromycin base (E-MYCIN) 500 MG tablet Please see bowel prep instructions 6 tablet 0  . glipiZIDE (GLUCOTROL XL) 10 MG 24 hr tablet Take 1 tablet (10 mg total) by mouth 2 (two) times daily. 180 tablet 1  . glucose blood (ACCU-CHEK COMPACT STRIPS) test strip 100 each by Other route as needed for other. Use as instructed, check FSBS once a day; LON 99 months, E11.9 100 each 1  . Iron-Vitamin C (IRON 100/C PO) Take 65 mg by mouth daily after breakfast.  2 HOURS  AFTER BREAKFAST    . Magnesium Oxide 250 MG TABS Take 250 mg by mouth daily.    . metFORMIN (GLUCOPHAGE) 1000 MG tablet Take 0.5 tablets (500 mg total) by mouth 2 (two) times daily. (Patient taking differently: Take 500 mg by mouth 2 (two) times daily. TAKES 0.5 TABLET) 90 tablet 0  . mupirocin nasal ointment (BACTROBAN NASAL) 2 % Place 1 application into the nose 2 (two) times daily. Use one-half of tube in each nostril twice daily for five (5) days. After application, press sides of nose together and gently massage. 10 g 0  . neomycin (MYCIFRADIN) 500 MG  tablet Please see bowel prep instructions 6 tablet 0  . Omega-3 Fatty Acids (FISH OIL) 1200 MG CAPS Take 1,200 mg by mouth 2 (two) times daily.     . polyethylene glycol powder (GLYCOLAX/MIRALAX) powder Please see bowel prep instructions 255 g 0  . quinapril (ACCUPRIL) 5 MG tablet Take 1 tablet (5 mg total) by mouth daily. 30 tablet 5  . rosuvastatin (CRESTOR) 20 MG tablet Take 0.5 tablets (10 mg total) by mouth daily. (Patient taking differently: Take 10 mg by mouth at bedtime. TAKES 0.5 TABLET) 45 tablet 1  . tamsulosin (FLOMAX) 0.4 MG CAPS capsule Take 0.4 mg by mouth daily.    . vitamin B-12 (CYANOCOBALAMIN) 100 MCG tablet Take 100 mcg by mouth daily.     No current facility-administered medications for this visit.     Allergies:   Patient has no known allergies.    Social History:  The patient  reports that he quit smoking about 6 months ago. His smoking use included Cigarettes. He has never used smokeless tobacco. He reports that he does not drink alcohol or use drugs.   Family History:  The patient's family history includes Alzheimer's disease in his mother; Cancer in his father and sister; Diabetes in his father; Heart disease in his father.    ROS:  Please see the history of present illness.   Otherwise, review of systems are positive for none.   All other systems are reviewed and negative.    PHYSICAL EXAM: VS:  BP (!) 154/52 (BP Location: Left Arm, Patient Position: Sitting, Cuff Size: Normal)   Ht 5\' 11"  (1.803 m)   Wt 283 lb (128.4 kg)   BMI 39.47 kg/m  , BMI Body mass index is 39.47 kg/m. GEN: Well nourished, well developed, in no acute distress  HEENT: normal  Neck: no JVD, carotid bruits, or masses Cardiac: RRR; no  rubs, or gallops,  trace edema . 2/6 crescendo decrescendo systolic murmur in the aortic area which is mid peaking  Respiratory:  clear to auscultation bilaterally, normal work of breathing GI: soft, nontender, nondistended, + BS MS: no  deformity or atrophy  Skin: warm and dry, no rash Neuro:  Strength and sensation are intact Psych: euthymic mood, full affect   EKG:  EKG is ordered today. The ekg ordered today demonstrates normal sinus rhythm with first-degree AV block. No significant ST or T wave changes.  Recent Labs: 04/28/2016: TSH 2.54 11/09/2016: ALT 26; BUN 27; Creatinine, Ser 1.25; Potassium 4.1; Sodium 140 11/18/2016: Hemoglobin 9.6; Platelets 280    Lipid Panel Labs(Brief)          Component Value Date/Time   CHOL 94 08/28/2016 0835   CHOL 132 12/25/2014 0803   TRIG 104 08/28/2016 0835   HDL 38 (L) 08/28/2016 0835   HDL 42 12/25/2014 0803   CHOLHDL 2.5 08/28/2016  0835   VLDL 21 08/28/2016 0835   LDLCALC 35 08/28/2016 0835   LDLCALC 67 12/25/2014 0803           Wt Readings from Last 3 Encounters:  12/15/16 283 lb (128.4 kg)  12/14/16 280 lb 14.4 oz (127.4 kg)  12/10/16 279 lb (126.6 kg)        No flowsheet data found.    ASSESSMENT AND PLAN:  1.  Preoperative cardiovascular evaluation: The patient has no prior cardiac history, no symptoms of angina and his baseline EKG does not show any signs of ischemia. He is able to walk 1 mile daily for exercise without significant anginal symptoms. In terms of bronchoscopy, he can proceed without any further cardiovascular testing at an acceptable risk overall.  However, for the bigger surgery which is colectomy, I want him to have an echocardiogram to evaluate his cardiac murmur. It is suggestive of aortic stenosis but that does not seem to be severe by physical exam.  2. Coronary atherosclerosis: This was noted on previous CT scan. This establishes the diagnosis of atherosclerosis. However, the patient has no anginal symptoms and EKG does not show ischemic changes. I recommend treatment of risk factors and low-dose aspirin 81 mg once daily once he is done with surgeries. I don't think stress testing is going to change his  management at the present time.  3. Hyperlipidemia: Continue treatment with rosuvastatin with a target LDL of less than 70.   Disposition:   FU with me as needed.   Signed,  Kathlyn Sacramento, MD  12/15/2016 1:46 PM    [facility]    Electronically signed by Wellington Hampshire, MD at 12/15/2016 2:12 PM      Office Visit on 12/15/2016        Detailed Report

## 2017-01-14 NOTE — Progress Notes (Signed)
[facility] day:  01/14/2017  Chief Complaint: Donald Mcdowell. is a 71 y.o. male with stage IIIB colon cancer who is seen for assessment after interval surgery.  HPI:   The patient was last seen in the medical oncology clinic on 12/24/2016.  At that time, patient was doing "ok". Patient had continued hematochezia. He was scheduled for colectomy.  We discussed plans for SBRT of the lung lesion (squamous cell carcinoma) 1 month after recovery from surgery.  He was admitted to [facility] from 12/28/2016 - 01/01/2017.  He underwent right partial colectomy on 12/28/2016 by Dr. Dahlia Byes.  He tolerated surgery well.  CBC at discharge included a hematocrit of 28.3, hemoglobin 9.4, and MCV 93.8.  Pathology revealed a 5.5 cm grade II invasive adenocarcinoma of the transverse colon, extending through the muscularis propria and into the adjacent adipose tissue, including the greater omentum. There was metastatic adenocarcinoma in multiple lymph nodes and tumor deposits, at least 5 involved with a total of at least 18 lymph nodes (5 of 18). There was a 2.5 cm nodal aggregate marked with a suture, containing multiple positive lymph nodes.  Another mass of metastatic tumor has features of tumor deposits and possibly another lymph node. Only definite nodal metastases were counted in the total, which may be an underestimate. There was no lymphovascular or perineural invasion. There were 2 small tubular adenomas. All margins appeared clear.  Pathologic stage was T3N2a.  Symptomatically, patient doing "ok". Patient has diarrhea (between 2-4 episodes daily) coming home from his colectomy. Diet has not changed tremendously. Patient's weight is down 15 pounds. Patient denies any bleeding; no hematochezia or melena.  He has not experienced any B symptoms or interval infections.    Past Medical History:  Diagnosis Date  . Abdominal aortic atherosclerosis ([place]) 09/15/2016   CT scan  June 2018  . Allergy   . Cancer ([place])   . Coronary artery calcification seen on CAT scan 09/15/2016   Previous smoker; noted on CT scan June 2018  . Diabetes mellitus without complication ([place])   . GERD (gastroesophageal reflux disease)   . Gout   . Hyperlipidemia   . Hypertension   . Kidney function abnormal    stage 2  . Kidney stones   . Obesity 08/20/2015    Past Surgical History:  Procedure Laterality Date  . COLONOSCOPY WITH PROPOFOL N/A 11/05/2016   Procedure: COLONOSCOPY WITH PROPOFOL;  Surgeon: Lucilla Lame, MD;  Location: [place];  Service: Gastroenterology;  Laterality: N/A;  . ELECTROMAGNETIC NAVIGATION BROCHOSCOPY N/A 12/16/2016   Procedure: ELECTROMAGNETIC NAVIGATION BRONCHOSCOPY;  Surgeon: Flora Lipps, MD;  Location: [facility];  Service: Cardiopulmonary;  Laterality: N/A;  . ESOPHAGOGASTRODUODENOSCOPY (EGD) WITH PROPOFOL N/A 11/05/2016   Procedure: ESOPHAGOGASTRODUODENOSCOPY (EGD) WITH PROPOFOL;  Surgeon: Lucilla Lame, MD;  Location: [place];  Service: Gastroenterology;  Laterality: N/A;  Diabetic - oral meds  . kidney stones    . PARTIAL COLECTOMY N/A 12/28/2016   Procedure: PARTIAL COLECTOMY RIGHT;  Surgeon: Jules Husbands, MD;  Location: [facility];  Service: General;  Laterality: N/A;  . POLYPECTOMY N/A 11/05/2016   Procedure: POLYPECTOMY;  Surgeon: Lucilla Lame, MD;  Location: [place];  Service: Gastroenterology;  Laterality: N/A;    Family History  Problem Relation Age of Onset  . Alzheimer's disease Mother   . Heart disease Father   . Diabetes Father   . Cancer Father        Prostate  .  Cancer Sister        skin cancer    Social History:  reports that he quit smoking about 7 months ago. His smoking use included Cigarettes. He has never used smokeless tobacco. He reports that he does not drink alcohol or use drugs.  Patient worked for a [employer].  He retired in 1996.  He was in his 12s. Patient was a 1-2 pack per day  smoker since he was a child.  He stopped smoking in 07/2016.  He drank alcohol in the past.  He has 3 living sisters (1 sister died).  He has no children.  The patient is accompanied by his wife [name]) and his neighbor [name]) today. Communicate with [name] [phone].  Allergies: No Known Allergies  Current Medications: Current Outpatient Prescriptions  Medication Sig Dispense Refill  . allopurinol (ZYLOPRIM) 300 MG tablet Take 1 tablet (300 mg total) by mouth daily. 90 tablet 3  . Ascorbic Acid (VITAMIN C PO) Take 1 tablet by mouth daily.    . Cyanocobalamin (VITAMIN B-12 PO) Take 1 tablet by mouth daily.    . ferrous sulfate 324 (65 Fe) MG TBEC Take 1 tablet by mouth daily.    Marland Kitchen glipiZIDE (GLUCOTROL XL) 10 MG 24 hr tablet Take 1 tablet (10 mg total) by mouth 2 (two) times daily. 180 tablet 1  . glucose blood (ACCU-CHEK COMPACT STRIPS) test strip 100 each by Other route as needed for other. Use as instructed, check FSBS once a day; LON 99 months, E11.9 100 each 1  . Magnesium Oxide 250 MG TABS Take 250 mg by mouth daily.    . metFORMIN (GLUCOPHAGE) 1000 MG tablet TAKE 1/2 (ONE-HALF) TABLET BY MOUTH TWICE DAILY 90 tablet 0  . Omega-3 Fatty Acids (FISH OIL) 1200 MG CAPS Take 1,200 mg by mouth 2 (two) times daily.     . quinapril (ACCUPRIL) 5 MG tablet Take 1 tablet (5 mg total) by mouth daily. 30 tablet 5  . rosuvastatin (CRESTOR) 20 MG tablet Take 0.5 tablets (10 mg total) by mouth daily. (Patient taking differently: Take 10 mg by mouth at bedtime. TAKES 0.5 TABLET) 45 tablet 1  . tamsulosin (FLOMAX) 0.4 MG CAPS capsule Take 0.4 mg by mouth daily.    Marland Kitchen HYDROcodone-acetaminophen (NORCO/VICODIN) 5-325 MG tablet Take 1-2 tablets by mouth every 6 (six) hours as needed for moderate pain. (Patient not taking: Reported on 01/08/2017) 30 tablet 0   No current facility-administered medications for this visit.     Review of Systems:  GENERAL:  Feels "pretty good".  No fevers or sweats.   Intentional weight loss (272 pounds in 04/2016). Weight down 15 pounds since last visit. PERFORMANCE STATUS (ECOG):  1 HEENT:  No visual changes, runny nose, sore throat, mouth sores or tenderness. Lungs: No shortness of breath or cough.  No hemoptysis. Cardiac:  No chest pain, palpitations, orthopnea, or PND. GI:  Diarrhea (between 2-4 episodes daily).  No nausea, vomiting, constipation, or melena. GU:  No urgency, frequency, dysuria, or hematuria. Musculoskeletal:  No back pain.  No joint pain.  No muscle tenderness. Extremities:  No pain or swelling. Skin:  No rashes or skin changes. Neuro:  No headache, numbness or weakness, balance or coordination issues. Endocrine:  Diabetes.  No thyroid issues, hot flashes or night sweats. Psych:  No mood changes, depression or anxiety. Pain:  No focal pain. Review of systems:  All other systems reviewed and found to be negative.  Physical Exam: Blood pressure  133/71, pulse 83, temperature 97.6 F (36.4 C), temperature source Tympanic, resp. rate 18, weight 267 lb 6 oz (121.3 kg), SpO2 97 %. GENERAL:  Well developed, well nourished, heavyset gentleman sitting comfortably in the exam room in no acute distress. MENTAL STATUS:  Alert and oriented to person, place and time. HEAD:  Thin gray hair.  Male pattern baldness.  Normocephalic, atraumatic, face symmetric, no Cushingoid features. EYES:  Glasses.  Blue eyes.  Pupils equal round and reactive to light and accomodation.  No conjunctivitis or scleral icterus. ENT:  Oropharynx clear without lesion.  Tongue normal. Mucous membranes moist.  RESPIRATORY:  Clear to auscultation without rales, wheezes or rhonchi. CARDIOVASCULAR:  Regular rate and rhythm without murmur, rub or gallop. ABDOMEN:  Midline abdominal incision.  Fully round.  Soft, non-tender, with active bowel sounds, and no appreciable hepatosplenomegaly.  No masses. SKIN:  No rashes, ulcers or lesions. EXTREMITIES: No edema, no skin  discoloration or tenderness.  No palpable cords. LYMPH NODES: No palpable cervical, supraclavicular, axillary or inguinal adenopathy  NEUROLOGICAL: Unremarkable. PSYCH:  Appropriate.   Appointment on 01/14/2017  Component Date Value Ref Range Status  . WBC 01/14/2017 4.9  3.8 - 10.6 K/uL Final  . RBC 01/14/2017 3.72* 4.40 - 5.90 MIL/uL Final  . Hemoglobin 01/14/2017 10.9* 13.0 - 18.0 g/dL Final  . HCT 01/14/2017 33.7* 40.0 - 52.0 % Final  . MCV 01/14/2017 90.5  80.0 - 100.0 fL Final  . MCH 01/14/2017 29.4  26.0 - 34.0 pg Final  . MCHC 01/14/2017 32.5  32.0 - 36.0 g/dL Final  . RDW 01/14/2017 15.2* 11.5 - 14.5 % Final  . Platelets 01/14/2017 355  150 - 440 K/uL Final  . Neutrophils Relative % 01/14/2017 58  % Final  . Neutro Abs 01/14/2017 2.8  1.4 - 6.5 K/uL Final  . Lymphocytes Relative 01/14/2017 30  % Final  . Lymphs Abs 01/14/2017 1.5  1.0 - 3.6 K/uL Final  . Monocytes Relative 01/14/2017 7  % Final  . Monocytes Absolute 01/14/2017 0.3  0.2 - 1.0 K/uL Final  . Eosinophils Relative 01/14/2017 4  % Final  . Eosinophils Absolute 01/14/2017 0.2  0 - 0.7 K/uL Final  . Basophils Relative 01/14/2017 1  % Final  . Basophils Absolute 01/14/2017 0.1  0 - 0.1 K/uL Final  . Sodium 01/14/2017 138  135 - 145 mmol/L Final  . Potassium 01/14/2017 4.3  3.5 - 5.1 mmol/L Final  . Chloride 01/14/2017 103  101 - 111 mmol/L Final  . CO2 01/14/2017 27  22 - 32 mmol/L Final  . Glucose, Bld 01/14/2017 174* 65 - 99 mg/dL Final  . BUN 01/14/2017 21* 6 - 20 mg/dL Final  . Creatinine, Ser 01/14/2017 0.97  0.61 - 1.24 mg/dL Final  . Calcium 01/14/2017 9.5  8.9 - 10.3 mg/dL Final  . Total Protein 01/14/2017 7.2  6.5 - 8.1 g/dL Final  . Albumin 01/14/2017 3.8  3.5 - 5.0 g/dL Final  . AST 01/14/2017 30  15 - 41 U/L Final  . ALT 01/14/2017 35  17 - 63 U/L Final  . Alkaline Phosphatase 01/14/2017 57  38 - 126 U/L Final  . Total Bilirubin 01/14/2017 0.4  0.3 - 1.2 mg/dL Final  . GFR calc non Af Amer  01/14/2017 >60  >60 mL/min Final  . GFR calc Af Amer 01/14/2017 >60  >60 mL/min Final   Comment: (NOTE) The eGFR has been calculated using the CKD EPI equation. This calculation has not been validated  in all clinical situations. eGFR's persistently <60 mL/min signify possible Chronic Kidney Disease.   Georgiann Hahn gap 01/14/2017 8  5 - 15 Final    Assessment:  Donald Mcdowell. is a 70 y.o. male with stage IIIB transverse colon cancer s/p right partial colectomy on 12/28/2016.  Pathology revealed a 5.5 cm grade II invasive adenocarcinoma of the transverse colon, extending through the muscularis propria and into the adjacent adipose tissue, including the greater omentum. There was metastatic adenocarcinoma in multiple lymph nodes and tumor deposits, at least 5 involved with a total of at least 18 lymph nodes (5 of 18).  Pathologic stage was T3N2a.  CEA was 1.4 on 11/09/2016.  Abdomen and pelvic CT on 11/09/2016 revealed mid transverse colon segment of luminal narrowing with wall thickening compatible with colon cancer.  There was a single enlarged adjacent mesenteric lymph node and prominent subcentimeter lymph nodes anterior to the pancreas which may be metastatic.  There was no evidence of distant metastasis.  There was a left kidney lower pole complex cyst with thin internal septations (Bosniak IIF).  His prostate was enlarged and extended into the floor of bladder.  There was right kidney lower pole punctate nonobstructing nephrolithiasis.  Chest CT on 11/18/2016 revealed no metastatic disease.  There was abrupt termination of the left upper lobe beyond which the distal bronchus appeared dilated with mild branching into the lung parenchyma.  Imaging suggested a bronchocele in the medial aspect of the left upper lobe. Differential included an endobronchial polyp, endobronchial tumor, bronchial stricture or aspirated foreign body.   Electromagnetic navigational bronchoscopy (ENB) on 12/16/2016  revealed non-small cell carcinoma, favor squamous cell lung carcinoma.  Tumor was positive for p40 and negative for cdx-2 and TTF-1.  EGD on 11/05/2016 revealed gastritis.  Pathology confirmed chronic mild gastritis and features of a healing erosion in the stomach.  There was no H pylori, dysplasia or malignancy.  He has iron deficiency anemia.  He is on oral iron.  Ferritin was 6 on 09/04/2016.  Hematocrit was 29.7 and hemoglobin 8.8 on 09/04/2016.  Hematocrit was 28.1, hemoglobin 9.3, and MCV 87.9 on 11/09/2016.   Symptomatically, he is recovering well from his colectomy. He has diarrhea. He denies pain.  Exam reveals a midline abdominal incision without evidence of infection.  WBC is 4900 with an [place] of 2800. Hemoglobin is 10.9, hematocrit 33.7, and platelets 355,000.  Plan: 1.  Discuss treatment options for stage IIIB colon cancer. Discussed 3 regimens:  FOLFOX (requires pump), Xelox (will not require pump), and Xeloda (oral) alone. Discussed side effect profile associated with all 3 treatment regimens.  Discuss DFS and OS (improved by 5-10%  with addition of oxaliplatin).  Patient will require port placement for FOLFOX or XELOX. Patient and family leaning toward FOLFOX.  Dr. Dahlia Byes has already discussed central access placement, however he wanted patient to discuss treatment options with medical oncology first.  2.  Discuss port placement. Dr. Dahlia Byes scheduled prot placement on 01/18/2017. 3.  Discuss follow up with radiation oncology for squamous cell lung cancer. Patient is scheduled to be seen in consult on 01/22/2017 for SBRT. Contact Dr. Baruch Gouty for treatment prior to initiation of 6 months of systemic chemotherapy. 4.  Schedule patient for next available chemotherapy class. 5.  Discuss medical oncology follow up schedule during and after completion of chemotherapy.  Patient will be seen every 2 weeks for 12 cycles of FOLFOX chemotherapy.  Labs will be drawn at each visit prior to chemotherapy.  After treatment completed, patient will be seen in clinic every 3 months for the first year, every 4 months for the second year, and every 6 months for the third through the [place] year.  Scans will be performed at the end of treatment and yearly x 5 years. 6.  Discuss iron deficiency. Encouraged patient to increase oral iron to twice a day to be taken with vitamin C. This will potentially help with his loose stools as well.  7.  RTC on 01/25/2017 for MD assessment, labs (CBC with diff, CMP, Mg), and cycle #1 FOLFOX (new)   [name], NP  01/14/2017, 9:08 AM   I saw and evaluated the patient, participating in the key portions of the service and reviewing pertinent diagnostic studies and records. I reviewed the nurse practitioner's note and agree with the findings and the plan.  The assessment and plan were discussed with the patient.  Approximately 40 minutes were spent face to face with the patient and his family.  Multiple questions were asked by the patient and answered.   Lequita Asal, MD 01/14/2017,9:08 AM

## 2017-01-14 NOTE — Progress Notes (Signed)
Pt in today for lab and follow up visit.  Reports "doing ok".  Caregivers deny any concerns or difficulties.

## 2017-01-14 NOTE — Patient Instructions (Signed)

## 2017-01-16 ENCOUNTER — Other Ambulatory Visit: Payer: Self-pay | Admitting: Hematology and Oncology

## 2017-01-16 ENCOUNTER — Encounter: Payer: Self-pay | Admitting: Hematology and Oncology

## 2017-01-16 NOTE — Progress Notes (Signed)
START ON PATHWAY REGIMEN - Colorectal     A cycle is every 14 days:     Oxaliplatin      Leucovorin      5-Fluorouracil      5-Fluorouracil   **Always confirm dose/schedule in your pharmacy ordering system**    Patient Characteristics: Colon Adjuvant, Stage III, High Risk (T4 or N2) Current evidence of distant metastases<= No AJCC T Category: T3 AJCC N Category: N2a AJCC M Category: M0 AJCC 8 Stage Grouping: IIIB Intent of Therapy: Curative Intent, Discussed with Patient

## 2017-01-16 NOTE — H&P (View-Only) (Signed)
START ON PATHWAY REGIMEN - Colorectal     A cycle is every 14 days:     Oxaliplatin      Leucovorin      5-Fluorouracil      5-Fluorouracil   **Always confirm dose/schedule in your pharmacy ordering system**    Patient Characteristics: Colon Adjuvant, Stage III, High Risk (T4 or N2) Current evidence of distant metastases<= No AJCC T Category: T3 AJCC N Category: N2a AJCC M Category: M0 AJCC 8 Stage Grouping: IIIB Intent of Therapy: Curative Intent, Discussed with Patient

## 2017-01-17 MED ORDER — CEFAZOLIN SODIUM-DEXTROSE 2-4 GM/100ML-% IV SOLN: 2.0000 g | INTRAVENOUS | Status: DC

## 2017-01-18 ENCOUNTER — Ambulatory Visit: Payer: Medicare Other

## 2017-01-18 ENCOUNTER — Ambulatory Visit
Admission: RE | Admit: 2017-01-18 | Discharge: 2017-01-18 | Disposition: A | Discharge status: Home / Self Care | Payer: Medicare Other | Source: Ambulatory Visit | Attending: Surgery | Admitting: Surgery

## 2017-01-18 ENCOUNTER — Telehealth: Payer: Self-pay | Admitting: Family Medicine

## 2017-01-18 ENCOUNTER — Ambulatory Visit: Payer: Medicare Other | Admitting: Anesthesiology

## 2017-01-18 ENCOUNTER — Encounter
Admission: RE | Disposition: A | Discharge status: Home / Self Care | Payer: Self-pay | Source: Ambulatory Visit | Attending: Surgery

## 2017-01-18 DIAGNOSIS — I2510 Atherosclerotic heart disease of native coronary artery without angina pectoris: Secondary | ICD-10-CM | POA: Diagnosis not present

## 2017-01-18 DIAGNOSIS — Z808 Family history of malignant neoplasm of other organs or systems: Secondary | ICD-10-CM | POA: Diagnosis not present

## 2017-01-18 DIAGNOSIS — I129 Hypertensive chronic kidney disease with stage 1 through stage 4 chronic kidney disease, or unspecified chronic kidney disease: Secondary | ICD-10-CM | POA: Insufficient documentation

## 2017-01-18 DIAGNOSIS — E1151 Type 2 diabetes mellitus with diabetic peripheral angiopathy without gangrene: Secondary | ICD-10-CM | POA: Insufficient documentation

## 2017-01-18 DIAGNOSIS — I700 Atherosclerosis of aorta: Secondary | ICD-10-CM | POA: Diagnosis not present

## 2017-01-18 DIAGNOSIS — Z8042 Family history of malignant neoplasm of prostate: Secondary | ICD-10-CM | POA: Diagnosis not present

## 2017-01-18 DIAGNOSIS — Z7984 Long term (current) use of oral hypoglycemic drugs: Secondary | ICD-10-CM | POA: Diagnosis not present

## 2017-01-18 DIAGNOSIS — E1122 Type 2 diabetes mellitus with diabetic chronic kidney disease: Secondary | ICD-10-CM | POA: Insufficient documentation

## 2017-01-18 DIAGNOSIS — I10 Essential (primary) hypertension: Secondary | ICD-10-CM | POA: Diagnosis not present

## 2017-01-18 DIAGNOSIS — K219 Gastro-esophageal reflux disease without esophagitis: Secondary | ICD-10-CM | POA: Insufficient documentation

## 2017-01-18 DIAGNOSIS — N182 Chronic kidney disease, stage 2 (mild): Secondary | ICD-10-CM | POA: Insufficient documentation

## 2017-01-18 DIAGNOSIS — Z9049 Acquired absence of other specified parts of digestive tract: Secondary | ICD-10-CM | POA: Diagnosis not present

## 2017-01-18 DIAGNOSIS — D509 Iron deficiency anemia, unspecified: Secondary | ICD-10-CM | POA: Insufficient documentation

## 2017-01-18 DIAGNOSIS — I739 Peripheral vascular disease, unspecified: Secondary | ICD-10-CM | POA: Diagnosis not present

## 2017-01-18 DIAGNOSIS — E1165 Type 2 diabetes mellitus with hyperglycemia: Secondary | ICD-10-CM | POA: Diagnosis not present

## 2017-01-18 DIAGNOSIS — C3412 Malignant neoplasm of upper lobe, left bronchus or lung: Secondary | ICD-10-CM | POA: Diagnosis not present

## 2017-01-18 DIAGNOSIS — Z87891 Personal history of nicotine dependence: Secondary | ICD-10-CM | POA: Diagnosis not present

## 2017-01-18 DIAGNOSIS — Z79899 Other long term (current) drug therapy: Secondary | ICD-10-CM | POA: Insufficient documentation

## 2017-01-18 DIAGNOSIS — Z419 Encounter for procedure for purposes other than remedying health state, unspecified: Secondary | ICD-10-CM

## 2017-01-18 DIAGNOSIS — E785 Hyperlipidemia, unspecified: Secondary | ICD-10-CM | POA: Insufficient documentation

## 2017-01-18 DIAGNOSIS — C182 Malignant neoplasm of ascending colon: Secondary | ICD-10-CM

## 2017-01-18 DIAGNOSIS — C189 Malignant neoplasm of colon, unspecified: Secondary | ICD-10-CM | POA: Diagnosis not present

## 2017-01-18 DIAGNOSIS — Z452 Encounter for adjustment and management of vascular access device: Secondary | ICD-10-CM | POA: Diagnosis not present

## 2017-01-18 DIAGNOSIS — M109 Gout, unspecified: Secondary | ICD-10-CM | POA: Diagnosis not present

## 2017-01-18 DIAGNOSIS — Z95828 Presence of other vascular implants and grafts: Secondary | ICD-10-CM

## 2017-01-18 DIAGNOSIS — C779 Secondary and unspecified malignant neoplasm of lymph node, unspecified: Secondary | ICD-10-CM | POA: Insufficient documentation

## 2017-01-18 DIAGNOSIS — C184 Malignant neoplasm of transverse colon: Secondary | ICD-10-CM | POA: Insufficient documentation

## 2017-01-18 HISTORY — PX: PORTACATH PLACEMENT: SHX2246

## 2017-01-18 LAB — GLUCOSE, CAPILLARY
Glucose-Capillary: 153 mg/dL — ABNORMAL HIGH (ref 65–99)
Glucose-Capillary: 137 mg/dL — ABNORMAL HIGH (ref 65–99)

## 2017-01-18 SURGERY — INSERTION, TUNNELED CENTRAL VENOUS DEVICE, WITH PORT
Anesthesia: Choice | Site: Chest | Laterality: Right | Wound class: Clean

## 2017-01-18 MED ORDER — PROPOFOL 500 MG/50ML IV EMUL
INTRAVENOUS | Status: AC
  Filled 2017-01-18: qty 50

## 2017-01-18 MED ORDER — SODIUM CHLORIDE 0.9 % IV SOLN
INTRAVENOUS | Status: DC | PRN
  Administered 2017-01-18: 1000 mL via INTRAMUSCULAR

## 2017-01-18 MED ORDER — CHLORHEXIDINE GLUCONATE CLOTH 2 % EX PADS: 6.0000 | MEDICATED_PAD | Freq: Once | CUTANEOUS | Status: DC

## 2017-01-18 MED ORDER — ONDANSETRON HCL 4 MG/2ML IJ SOLN: 4.0000 mg | Freq: Once | INTRAMUSCULAR | Status: DC | PRN

## 2017-01-18 MED ORDER — CEFAZOLIN SODIUM-DEXTROSE 2-4 GM/100ML-% IV SOLN
INTRAVENOUS | Status: AC
  Filled 2017-01-18: qty 100

## 2017-01-18 MED ORDER — PROPOFOL 500 MG/50ML IV EMUL
INTRAVENOUS | Status: DC | PRN
  Administered 2017-01-18: 80 ug/kg/min via INTRAVENOUS

## 2017-01-18 MED ORDER — FENTANYL CITRATE (PF) 100 MCG/2ML IJ SOLN: 25.0000 ug | INTRAMUSCULAR | Status: DC | PRN

## 2017-01-18 MED ORDER — HEPARIN SODIUM (PORCINE) 5000 UNIT/ML IJ SOLN
INTRAMUSCULAR | Status: AC
  Filled 2017-01-18: qty 1

## 2017-01-18 MED ORDER — FENTANYL CITRATE (PF) 100 MCG/2ML IJ SOLN
INTRAMUSCULAR | Status: DC | PRN
  Administered 2017-01-18: 50 ug via INTRAVENOUS
  Administered 2017-01-18 (×2): 25 ug via INTRAVENOUS

## 2017-01-18 MED ORDER — BUPIVACAINE-EPINEPHRINE (PF) 0.25% -1:200000 IJ SOLN
INTRAMUSCULAR | Status: AC
  Filled 2017-01-18: qty 10

## 2017-01-18 MED ORDER — PROPOFOL 10 MG/ML IV BOLUS
INTRAVENOUS | Status: DC | PRN
  Administered 2017-01-18: 50 mg via INTRAVENOUS

## 2017-01-18 MED ORDER — SODIUM CHLORIDE 0.9 % IV SOLN
INTRAVENOUS | Status: DC
  Administered 2017-01-18: 10:00:00 via INTRAVENOUS

## 2017-01-18 MED ORDER — FAMOTIDINE 20 MG PO TABS
ORAL_TABLET | ORAL | Status: AC
  Administered 2017-01-18: 20 mg via ORAL
  Filled 2017-01-18: qty 1

## 2017-01-18 MED ORDER — LIDOCAINE HCL (PF) 1 % IJ SOLN
INTRAMUSCULAR | Status: AC
  Filled 2017-01-18: qty 30

## 2017-01-18 MED ORDER — CHLORHEXIDINE GLUCONATE CLOTH 2 % EX PADS
6.0000 | MEDICATED_PAD | Freq: Once | CUTANEOUS | Status: DC
Start: 2017-01-18 — End: 2017-01-18

## 2017-01-18 MED ORDER — FAMOTIDINE 20 MG PO TABS
20.0000 mg | ORAL_TABLET | Freq: Once | ORAL | Status: AC
  Administered 2017-01-18: 20 mg via ORAL

## 2017-01-18 MED ORDER — FENTANYL CITRATE (PF) 100 MCG/2ML IJ SOLN
INTRAMUSCULAR | Status: AC
  Filled 2017-01-18: qty 2

## 2017-01-18 SURGICAL SUPPLY — 33 items
BAG DECANTER FOR FLEXI CONT (MISCELLANEOUS) ×2 IMPLANT
BLADE SURG SZ11 CARB STEEL (BLADE) ×2 IMPLANT
BOOT SUTURE AID YELLOW STND (SUTURE) ×2 IMPLANT
CANISTER SUCT 1200ML W/VALVE (MISCELLANEOUS) ×2 IMPLANT
CHLORAPREP W/TINT 26ML (MISCELLANEOUS) ×2 IMPLANT
COVER LIGHT HANDLE STERIS (MISCELLANEOUS) ×4 IMPLANT
DERMABOND ADVANCED (GAUZE/BANDAGES/DRESSINGS) ×1
DERMABOND ADVANCED .7 DNX12 (GAUZE/BANDAGES/DRESSINGS) ×1 IMPLANT
DRAPE C-ARM XRAY 36X54 (DRAPES) ×2 IMPLANT
DRAPE INCISE IOBAN 66X45 STRL (DRAPES) ×2 IMPLANT
DRAPE SHEET LG 3/4 BI-LAMINATE (DRAPES) ×2 IMPLANT
ELECT REM PT RETURN 9FT ADLT (ELECTROSURGICAL) ×2
ELECTRODE REM PT RTRN 9FT ADLT (ELECTROSURGICAL) ×1 IMPLANT
GEL ULTRASOUND 20GR AQUASONIC (MISCELLANEOUS) ×2 IMPLANT
GLOVE BIO SURGEON STRL SZ7 (GLOVE) ×2 IMPLANT
GOWN STRL REUS W/ TWL LRG LVL3 (GOWN DISPOSABLE) ×2 IMPLANT
GOWN STRL REUS W/TWL LRG LVL3 (GOWN DISPOSABLE) ×2
IV NS 500ML (IV SOLUTION) ×1
IV NS 500ML BAXH (IV SOLUTION) ×1 IMPLANT
KIT PORT POWER 8FR ISP CVUE (Miscellaneous) ×2 IMPLANT
NEEDLE HYPO 22GX1.5 SAFETY (NEEDLE) ×2 IMPLANT
NS IRRIG 1000ML POUR BTL (IV SOLUTION) ×2 IMPLANT
PACK PORT-A-CATH (MISCELLANEOUS) ×2 IMPLANT
SPONGE LAP 18X18 5 PK (GAUZE/BANDAGES/DRESSINGS) ×2 IMPLANT
SUT MNCRL AB 4-0 PS2 18 (SUTURE) ×2 IMPLANT
SUT PROLENE 2-0 (SUTURE) ×1
SUT PROLENE 2-0 RB1 36X2 ARM (SUTURE) ×1
SUT VIC AB 3-0 SH 27 (SUTURE) ×1
SUT VIC AB 3-0 SH 27X BRD (SUTURE) ×1 IMPLANT
SUTURE PROLEN 2-0 RB1 36X2 ARM (SUTURE) ×1 IMPLANT
SYR 20CC LL (SYRINGE) ×2 IMPLANT
SYR 5ML LL (SYRINGE) ×2 IMPLANT
TOWEL OR 17X26 4PK STRL BLUE (TOWEL DISPOSABLE) ×2 IMPLANT

## 2017-01-18 NOTE — Anesthesia Preprocedure Evaluation (Signed)
Anesthesia Evaluation  Patient identified by MRN, date of birth, ID band Patient awake    Reviewed: Allergy & Precautions, H&P , NPO status , Patient's Chart, lab work & pertinent test results, reviewed documented beta blocker date and time   Airway Mallampati: II   Neck ROM: full    Dental  (+) Poor Dentition   Pulmonary neg pulmonary ROS, former smoker,    Pulmonary exam normal        Cardiovascular Exercise Tolerance: Good hypertension, On Medications + CAD and + Peripheral Vascular Disease  negative cardio ROS Normal cardiovascular exam Rhythm:regular Rate:Normal     Neuro/Psych negative neurological ROS  negative psych ROS   GI/Hepatic negative GI ROS, Neg liver ROS, GERD  Medicated,  Endo/Other  negative endocrine ROSdiabetes, Poorly Controlled, Type 2, Oral Hypoglycemic Agents  Renal/GU Renal diseasenegative Renal ROS  negative genitourinary   Musculoskeletal   Abdominal   Peds  Hematology negative hematology ROS (+) anemia ,   Anesthesia Other Findings Past Medical History: 09/15/2016: Abdominal aortic atherosclerosis ([place])     Comment:  CT scan June 2018 No date: Allergy No date: Cancer [facility]) 09/15/2016: Coronary artery calcification seen on CAT scan     Comment:  Previous smoker; noted on CT scan June 2018 No date: Diabetes mellitus without complication (HCC) No date: GERD (gastroesophageal reflux disease) No date: Gout No date: Hyperlipidemia No date: Hypertension No date: Kidney function abnormal     Comment:  stage 2 No date: Kidney stones 08/20/2015: Obesity Past Surgical History: No date: kidney stones BMI    Body Mass Index:  37.24 kg/m     Reproductive/Obstetrics negative OB ROS                             Anesthesia Physical Anesthesia Plan  ASA: III  Anesthesia Plan:    Post-op Pain Management:    Induction:   PONV Risk Score and Plan: 2  Airway  Management Planned:   Additional Equipment:   Intra-op Plan:   Post-operative Plan:   Informed Consent: I have reviewed the patients History and Physical, chart, labs and discussed the procedure including the risks, benefits and alternatives for the proposed anesthesia with the patient or authorized representative who has indicated his/her understanding and acceptance.   Dental Advisory Given  Plan Discussed with: CRNA  Anesthesia Plan Comments:         Anesthesia Quick Evaluation

## 2017-01-18 NOTE — Transfer of Care (Signed)
Immediate Anesthesia Transfer of Care Note  Patient: Donald Mcdowell.  Procedure(s) Performed: INSERTION PORT-A-CATH (Right Chest)  Patient Location: PACU  Anesthesia Type:General  Level of Consciousness: awake, alert  and oriented  Airway & Oxygen Therapy: Patient Spontanous Breathing and Patient connected to nasal cannula oxygen  Post-op Assessment: Report given to RN and Post -op Vital signs reviewed and stable  Post vital signs: Reviewed and stable  Last Vitals:  Vitals:   01/18/17 1010 01/18/17 1254  BP: (!) 153/66 129/62  Pulse: 76 72  Resp: 16 17  Temp: (!) 36.3 C 36.6 C  SpO2: 100% 98%    Last Pain:  Vitals:   01/18/17 1010  TempSrc: Temporal         Complications: No apparent anesthesia complications

## 2017-01-18 NOTE — Discharge Instructions (Signed)
General Anesthesia, Adult, Care After These instructions provide you with information about caring for yourself after your procedure. Your health care provider may also give you more specific instructions. Your treatment has been planned according to current medical practices, but problems sometimes occur. Call your health care provider if you have any problems or questions after your procedure. What can I expect after the procedure? After the procedure, it is common to have:  Vomiting.  A sore throat.  Mental slowness.  It is common to feel:  Nauseous.  Cold or shivery.  Sleepy.  Tired.  Sore or achy, even in parts of your body where you did not have surgery.  Follow these instructions at home: For at least 24 hours after the procedure:  Do not: ? Participate in activities where you could fall or become injured. ? Drive. ? Use heavy machinery. ? Drink alcohol. ? Take sleeping pills or medicines that cause drowsiness. ? Make important decisions or sign legal documents. ? Take care of children on your own.  Rest. Eating and drinking  If you vomit, drink water, juice, or soup when you can drink without vomiting.  Drink enough fluid to keep your urine clear or pale yellow.  Make sure you have little or no nausea before eating solid foods.  Follow the diet recommended by your health care provider. General instructions  Have a responsible adult stay with you until you are awake and alert.  Return to your normal activities as told by your health care provider. Ask your health care provider what activities are safe for you.  Take over-the-counter and prescription medicines only as told by your health care provider.  If you smoke, do not smoke without supervision.  Keep all follow-up visits as told by your health care provider. This is important. Contact a health care provider if:  You continue to have nausea or vomiting at home, and medicines are not helpful.  You  cannot drink fluids or start eating again.  You cannot urinate after 8-12 hours.  You develop a skin rash.  You have fever.  You have increasing redness at the site of your procedure. Get help right away if:  You have difficulty breathing.  You have chest pain.  You have unexpected bleeding.  You feel that you are having a life-threatening or urgent problem. This information is not intended to replace advice given to you by your health care provider. Make sure you discuss any questions you have with your health care provider. Document Released: 06/08/2000 Document Revised: 08/05/2015 Document Reviewed: 02/14/2015 Elsevier Interactive Patient Education  2018 [employer] An implanted port is a type of central line that is placed under the skin. Central lines are used to provide IV access when treatment or nutrition needs to be given through a persons veins. Implanted ports are used for long-term IV access. An implanted port may be placed because:  You need IV medicine that would be irritating to the small veins in your hands or arms.  You need long-term IV medicines, such as antibiotics.  You need IV nutrition for a long period.  You need frequent blood draws for lab tests.  You need dialysis.  Implanted ports are usually placed in the chest area, but they can also be placed in the upper arm, the abdomen, or the leg. An implanted port has two main parts:  Reservoir. The reservoir is round and will appear as a small, raised area under your skin.  The reservoir is the part where a needle is inserted to give medicines or draw blood.  Catheter. The catheter is a thin, flexible tube that extends from the reservoir. The catheter is placed into a large vein. Medicine that is inserted into the reservoir goes into the catheter and then into the vein.  How will I care for my incision site? Do not get the incision site wet. Bathe or shower as directed by  your health care provider. How is my port accessed? Special steps must be taken to access the port:  Before the port is accessed, a numbing cream can be placed on the skin. This helps numb the skin over the port site.  Your health care provider uses a sterile technique to access the port. ? Your health care provider must put on a mask and sterile gloves. ? The skin over your port is cleaned carefully with an antiseptic and allowed to dry. ? The port is gently pinched between sterile gloves, and a needle is inserted into the port.  Only "non-coring" port needles should be used to access the port. Once the port is accessed, a blood return should be checked. This helps ensure that the port is in the vein and is not clogged.  If your port needs to remain accessed for a constant infusion, a clear (transparent) bandage will be placed over the needle site. The bandage and needle will need to be changed every week, or as directed by your health care provider.  Keep the bandage covering the needle clean and dry. Do not get it wet. Follow your health care providers instructions on how to take a shower or bath while the port is accessed.  If your port does not need to stay accessed, no bandage is needed over the port.  What is flushing? Flushing helps keep the port from getting clogged. Follow your health care providers instructions on how and when to flush the port. Ports are usually flushed with saline solution or a medicine called heparin. The need for flushing will depend on how the port is used.  If the port is used for intermittent medicines or blood draws, the port will need to be flushed: ? After medicines have been given. ? After blood has been drawn. ? As part of routine maintenance.  If a constant infusion is running, the port may not need to be flushed.  How long will my port stay implanted? The port can stay in for as long as your health care provider thinks it is needed. When it is  time for the port to come out, surgery will be done to remove it. The procedure is similar to the one performed when the port was put in. When should I seek immediate medical care? When you have an implanted port, you should seek immediate medical care if:  You notice a bad smell coming from the incision site.  You have swelling, redness, or drainage at the incision site.  You have more swelling or pain at the port site or the surrounding area.  You have a fever that is not controlled with medicine.  This information is not intended to replace advice given to you by your health care provider. Make sure you discuss any questions you have with your health care provider. Document Released: 03/02/2005 Document Revised: 08/08/2015 Document Reviewed: 11/07/2012 Elsevier Interactive Patient Education  2017 [employer].

## 2017-01-18 NOTE — Anesthesia Post-op Follow-up Note (Signed)
Anesthesia QCDR form completed.        

## 2017-01-18 NOTE — Op Note (Signed)
  Pre-operative Diagnosis: Colon CA  Post-operative Diagnosis: Same   Surgeon: Caroleen Hamman, MD FACS  Anesthesia: IV sedation, marcaine .25% w epi and lidocaine 1%  Procedure: right IJ  Port placement with fluoroscopy under U/S guidance  Findings: Good position of the tip of the catheter by fluoroscopy  Estimated Blood Loss: Minimal         Drains: None         Specimens: None       Complications: none            Procedure Details  The patient was seen again in the Holding Room. The benefits, complications, treatment options, and expected outcomes were discussed with the patient. The risks of bleeding, infection, recurrence of symptoms, failure to resolve symptoms,  thrombosis nonfunction breakage pneumothorax hemopneumothorax any of which could require chest tube or further surgery were reviewed with the patient.   The patient was taken to Operating Room, identified as Donald Mcdowell. and the procedure verified.  A Time Out was held and the above information confirmed.  Prior to the induction of general anesthesia, antibiotic prophylaxis was administered. VTE prophylaxis was in place. Appropriate anesthesia was then administered and tolerated well. The chest was prepped with Chloraprep and draped in the sterile fashion. The patient was positioned in the supine position. Then the patient was placed in Trendelenburg position.  Patient was prepped and draped in sterile fashion and in a Trendelenburg position local anesthetic was infiltrated into the skin and subcutaneous tissues in the neck and anterior chest wall. The large bore needle was placed into the internal jugular vein under U/S guidance without difficulty and then the Seldinger wire was advanced. Fluoroscopy was utilized to confirm that the Seldinger wire was in the superior vena cava.  An incision was made and a port pocket developed with blunt and electrocautery dissection. The introducer dilator was placed over the  Seldinger wire the wire was removed. The previously flushed catheter was placed into the introducer dilator and the peel-away sheath was removed. The catheter length was confirmed and trimmed utilizing fluoroscopy for proper positioning. The catheter was then attached to the previously flushed port. The port was placed into the pocket. The port was held in with 2-0 Prolenes and flushed for function and heparin locked.  The wound was closed with interrupted 3-0 Vicryl followed by 4-0 subcuticular Monocryl sutures. Dermabond used to coat the skin  Patient was taken to the recovery room in stable condition where a postoperative chest film has been ordered.  Please note that there the bar of the table interfered with some of the fluoroscopy images. We had to rotate the arm in order to achieve good fluoro visualization.

## 2017-01-18 NOTE — Telephone Encounter (Signed)
I called and left detailed message Thinking about Donald Mcdowell and [name] If there is anything I can do as he goes through this, please don't hesitate to call me

## 2017-01-18 NOTE — Interval H&P Note (Signed)
History and Physical Interval Note:  01/18/2017 11:18 AM  Donald Mcdowell.  has presented today for surgery, with the diagnosis of colOn cancer  The various methods of treatment have been discussed with the patient and family. After consideration of risks, benefits and other options for treatment, the patient has consented to  Procedure(s): INSERTION PORT-A-CATH (N/A) as a surgical intervention .  The patient's history has been reviewed, patient examined, no change in status, stable for surgery.  I have reviewed the patient's chart and labs.  Questions were answered to the patient's satisfaction.     [place]

## 2017-01-19 ENCOUNTER — Inpatient Hospital Stay: Payer: Medicare Other

## 2017-01-19 ENCOUNTER — Other Ambulatory Visit: Payer: Self-pay | Admitting: Hematology and Oncology

## 2017-01-19 ENCOUNTER — Ambulatory Visit: Payer: Medicare Other | Admitting: Hematology and Oncology

## 2017-01-19 ENCOUNTER — Other Ambulatory Visit: Payer: Medicare Other

## 2017-01-19 DIAGNOSIS — C184 Malignant neoplasm of transverse colon: Secondary | ICD-10-CM

## 2017-01-19 MED ORDER — LORAZEPAM 0.5 MG PO TABS: 0.5000 mg | ORAL_TABLET | Freq: Four times a day (QID) | ORAL | 0 refills | Status: DC | PRN

## 2017-01-19 MED ORDER — LIDOCAINE-PRILOCAINE 2.5-2.5 % EX CREA: TOPICAL_CREAM | CUTANEOUS | 3 refills | Status: DC

## 2017-01-19 MED ORDER — ONDANSETRON HCL 8 MG PO TABS: 8.0000 mg | ORAL_TABLET | Freq: Two times a day (BID) | ORAL | 1 refills | Status: DC | PRN

## 2017-01-20 ENCOUNTER — Other Ambulatory Visit: Payer: Self-pay | Admitting: *Deleted

## 2017-01-20 ENCOUNTER — Other Ambulatory Visit: Payer: Self-pay

## 2017-01-20 ENCOUNTER — Telehealth: Payer: Self-pay | Admitting: *Deleted

## 2017-01-20 ENCOUNTER — Encounter: Payer: Self-pay | Admitting: Radiation Oncology

## 2017-01-20 ENCOUNTER — Ambulatory Visit
Admission: RE | Admit: 2017-01-20 | Discharge: 2017-01-20 | Disposition: A | Discharge status: Home / Self Care | Payer: Medicare Other | Source: Ambulatory Visit | Attending: Radiation Oncology | Admitting: Radiation Oncology

## 2017-01-20 VITALS — BP 146/66 | HR 85 | Temp 97.2°F | Resp 20 | Ht 71.0 in | Wt 267.6 lb

## 2017-01-20 DIAGNOSIS — Z510 Encounter for antineoplastic radiation therapy: Secondary | ICD-10-CM | POA: Diagnosis not present

## 2017-01-20 DIAGNOSIS — C3412 Malignant neoplasm of upper lobe, left bronchus or lung: Secondary | ICD-10-CM

## 2017-01-20 DIAGNOSIS — Z87891 Personal history of nicotine dependence: Secondary | ICD-10-CM | POA: Insufficient documentation

## 2017-01-20 DIAGNOSIS — K219 Gastro-esophageal reflux disease without esophagitis: Secondary | ICD-10-CM | POA: Insufficient documentation

## 2017-01-20 DIAGNOSIS — E119 Type 2 diabetes mellitus without complications: Secondary | ICD-10-CM | POA: Diagnosis not present

## 2017-01-20 DIAGNOSIS — E669 Obesity, unspecified: Secondary | ICD-10-CM | POA: Insufficient documentation

## 2017-01-20 DIAGNOSIS — E785 Hyperlipidemia, unspecified: Secondary | ICD-10-CM | POA: Insufficient documentation

## 2017-01-20 DIAGNOSIS — Z79899 Other long term (current) drug therapy: Secondary | ICD-10-CM | POA: Diagnosis not present

## 2017-01-20 DIAGNOSIS — Z7984 Long term (current) use of oral hypoglycemic drugs: Secondary | ICD-10-CM | POA: Insufficient documentation

## 2017-01-20 DIAGNOSIS — I714 Abdominal aortic aneurysm, without rupture: Secondary | ICD-10-CM | POA: Diagnosis not present

## 2017-01-20 DIAGNOSIS — N2889 Other specified disorders of kidney and ureter: Secondary | ICD-10-CM | POA: Diagnosis not present

## 2017-01-20 DIAGNOSIS — Z87442 Personal history of urinary calculi: Secondary | ICD-10-CM | POA: Insufficient documentation

## 2017-01-20 DIAGNOSIS — Z800 Family history of malignant neoplasm of digestive organs: Secondary | ICD-10-CM | POA: Insufficient documentation

## 2017-01-20 DIAGNOSIS — I2510 Atherosclerotic heart disease of native coronary artery without angina pectoris: Secondary | ICD-10-CM | POA: Insufficient documentation

## 2017-01-20 DIAGNOSIS — C184 Malignant neoplasm of transverse colon: Secondary | ICD-10-CM | POA: Insufficient documentation

## 2017-01-20 DIAGNOSIS — C772 Secondary and unspecified malignant neoplasm of intra-abdominal lymph nodes: Secondary | ICD-10-CM | POA: Insufficient documentation

## 2017-01-20 DIAGNOSIS — I10 Essential (primary) hypertension: Secondary | ICD-10-CM | POA: Insufficient documentation

## 2017-01-20 NOTE — Telephone Encounter (Signed)
Spoke with pt's caregiver, [name], and she asked if patient will need dexamethasone and compazine in addition to the other take-home meds called in. Only meds that were called into pharmacy were ativan, zofran, and emla. Please advise.

## 2017-01-20 NOTE — Telephone Encounter (Signed)
Pt's wife called to inform us that medications called in for chemo treatments require prior authorization. PA completed for zofran, emla, and ativan via [email or web address]. Awaiting response at this time. Nothing further needed.

## 2017-01-20 NOTE — Telephone Encounter (Signed)
No. He only needs what we have sent in for him. He does not need the Decadron or the Compazine at this time. Those are interventions that we can employ in the future if needed.  Thanks, Gaspar Bidding

## 2017-01-20 NOTE — Consult Note (Signed)
NEW PATIENT EVALUATION  Name: Donald Mcdowell.  MRN: [id number]  Date:   01/20/2017     DOB: September 01, 1945   This 71 y.o. male patient presents to the clinic for initial evaluation of probable stage I. Squamous cell carcinoma of the left upper lobe in patient with known locally advanced rectal cancer  REFERRING PHYSICIAN: Flora Lipps, MD  CHIEF COMPLAINT:  Chief Complaint  Patient presents with  . Lung Cancer    Pt is here for initial consultation of lung cancer.     DIAGNOSIS: The encounter diagnosis was Cancer of upper lobe of left lung ([place]).   PREVIOUS INVESTIGATIONS:  CT scans reviewed Pathology report reviewed Clinical notes reviewed  HPI: Patient is a 71 year old male who presented with rectal bleeding and was found to have adenocarcinoma of the transverse colon. He initially presented in August 2018 with a partially obstructing mass in the transverse colon biopsy-positive adenocarcinoma. As part of his workup chest CT on 11/18/2016 showed abrupt termination of left upper lobe bronchus. He underwent electromagnetic navigational bronchoscopy showing non-small cell lung cancer favoring squamous cell carcinoma. He underwent surgical resection of his terminal ileum right transverse colon showing invasive adenocarcinoma extending through the muscularis propria into adjacent adipose tissue including the greater omentum. The release 5 lymph nodes involved with metastatic disease. All margins appear clear. He is scheduled to start FOLFOX chemotherapy. Patient is somewhat depressed about his diagnosis. He is referred today to radiation oncology for consideration of treatment to his left upper lobe non-small cell lung cancer. He does have a slight cough no hemoptysis.   PLANNED TREATMENT REGIMEN: SB RT to his left upper lobe  PAST MEDICAL HISTORY:  has a past medical history of Abdominal aortic atherosclerosis ([place]) (09/15/2016), Allergy, Cancer ([place]), Coronary artery calcification seen on  CAT scan (09/15/2016), Diabetes mellitus without complication ([place]), GERD (gastroesophageal reflux disease), Gout, Hyperlipidemia, Hypertension, Kidney function abnormal, Kidney stones, and Obesity (08/20/2015).    PAST SURGICAL HISTORY:  Past Surgical History:  Procedure Laterality Date  . kidney stones      FAMILY HISTORY: family history includes Alzheimer's disease in his mother; Cancer in his father and sister; Diabetes in his father; Heart disease in his father.  SOCIAL HISTORY:  reports that he quit smoking about 7 months ago. His smoking use included cigarettes. he has never used smokeless tobacco. He reports that he does not drink alcohol or use drugs.  ALLERGIES: Patient has no known allergies.  MEDICATIONS:  Current Outpatient Medications  Medication Sig Dispense Refill  . allopurinol (ZYLOPRIM) 300 MG tablet Take 1 tablet (300 mg total) by mouth daily. 90 tablet 3  . Alpha-Lipoic Acid 600 MG CAPS Take 1 capsule 2 (two) times daily by mouth.    . Ascorbic Acid (VITAMIN C PO) Take 1 tablet by mouth daily.    . Cyanocobalamin (VITAMIN B-12 PO) Take 1 tablet by mouth daily.    Marland Kitchen erythromycin base (E-MYCIN) 500 MG tablet   0  . ferrous sulfate 324 (65 Fe) MG TBEC Take 1 tablet by mouth 2 (two) times daily.     Marland Kitchen glipiZIDE (GLUCOTROL XL) 10 MG 24 hr tablet Take 1 tablet (10 mg total) by mouth 2 (two) times daily. 180 tablet 1  . glucose blood (ACCU-CHEK COMPACT STRIPS) test strip 100 each by Other route as needed for other. Use as instructed, check FSBS once a day; LON 99 months, E11.9 100 each 1  . lidocaine-prilocaine (EMLA) cream Apply to affected area once 30  g 3  . LORazepam (ATIVAN) 0.5 MG tablet Take 1 tablet (0.5 mg total) every 6 (six) hours as needed by mouth (Nausea or vomiting). 30 tablet 0  . Magnesium Oxide 250 MG TABS Take 250 mg by mouth every morning.     . metFORMIN (GLUCOPHAGE) 1000 MG tablet TAKE 1/2 (ONE-HALF) TABLET BY MOUTH TWICE DAILY 90 tablet 0  . neomycin  (MYCIFRADIN) 500 MG tablet   0  . Omega-3 Fatty Acids (FISH OIL) 1200 MG CAPS Take 1,200 mg by mouth 2 (two) times daily.     . ondansetron (ZOFRAN) 8 MG tablet Take 1 tablet (8 mg total) 2 (two) times daily as needed by mouth for refractory nausea / vomiting. Start on day 3 after chemotherapy. 30 tablet 1  . quinapril (ACCUPRIL) 5 MG tablet Take 1 tablet (5 mg total) by mouth daily. 30 tablet 5  . rosuvastatin (CRESTOR) 20 MG tablet Take 0.5 tablets (10 mg total) by mouth daily. (Patient taking differently: Take 10 mg by mouth at bedtime. TAKES 0.5 TABLET) 45 tablet 1  . tamsulosin (FLOMAX) 0.4 MG CAPS capsule Take 0.4 mg by mouth daily.    Marland Kitchen Zn-Pyg Afri-Nettle-Saw Palmet (SAW PALMETTO COMPLEX PO) Take 2 capsules 2 (two) times daily by mouth.    Marland Kitchen HYDROcodone-acetaminophen (NORCO/VICODIN) 5-325 MG tablet Take 1-2 tablets by mouth every 6 (six) hours as needed for moderate pain. (Patient not taking: Reported on 01/08/2017) 30 tablet 0  . mupirocin ointment (BACTROBAN) 2 %   0   No current facility-administered medications for this encounter.     ECOG PERFORMANCE STATUS:  1 - Symptomatic but completely ambulatory  REVIEW OF SYSTEMS: Patient continues to have some diarrhea secondary to his recent surgery. Slight cough no hemoptysis. Patient denies any weight loss, fatigue, weakness, fever, chills or night sweats. Patient denies any loss of vision, blurred vision. Patient denies any ringing  of the ears or hearing loss. No irregular heartbeat. Patient denies heart murmur or history of fainting. Patient denies any chest pain or pain radiating to her upper extremities. Patient denies any shortness of breath, difficulty breathing at night, cough or hemoptysis. Patient denies any swelling in the lower legs. Patient denies any nausea vomiting, vomiting of blood, or coffee ground material in the vomitus. Patient denies any stomach pain. Patient states has had normal bowel movements no significant constipation  or diarrhea. Patient denies any dysuria, hematuria or significant nocturia. Patient denies any problems walking, swelling in the joints or loss of balance. Patient denies any skin changes, loss of hair or loss of weight. Patient denies any excessive worrying or anxiety or significant depression. Patient denies any problems with insomnia. Patient denies excessive thirst, polyuria, polydipsia. Patient denies any swollen glands, patient denies easy bruising or easy bleeding. Patient denies any recent infections, allergies or URI. Patient "s visual fields have not changed significantly in recent time.    PHYSICAL EXAM: BP (!) 146/66   Pulse 85   Temp (!) 97.2 F (36.2 C)   Resp 20   Ht 5\' 11"  (1.803 m)   Wt 267 lb 10.2 oz (121.4 kg)   BMI 37.33 kg/m  Well-developed slightly obese male in NAD. He has had recent abdominal surgery with incision well-healed. Well-developed well-nourished patient in NAD. HEENT reveals PERLA, EOMI, discs not visualized.  Oral cavity is clear. No oral mucosal lesions are identified. Neck is clear without evidence of cervical or supraclavicular adenopathy. Lungs are clear to A&P. Cardiac examination is essentially unremarkable with  regular rate and rhythm without murmur rub or thrill. Abdomen is benign with no organomegaly or masses noted. Motor sensory and DTR levels are equal and symmetric in the upper and lower extremities. Cranial nerves II through XII are grossly intact. Proprioception is intact. No peripheral adenopathy or edema is identified. No motor or sensory levels are noted. Crude visual fields are within normal range.  LABORATORY DATA: Pathology reports reviewed    RADIOLOGY RESULTS: CT scans reviewed PET CT scan ordered   IMPRESSION: Probable stage I non-small cell lung cancer favoring squamous cell carcinoma the left upper lobe in patient with locally advanced transverse colon adenocarcinoma status post resection in 71 year old male  .Marland Kitchen  PLAN:  At this  time of ordered a PET CT scan to better delineate the actual tumor involvement of his left upper lobe and possible mediastinum. I've proposed SB RT if possible to his left upper lobe 5000 cGy in 5 fractions. I will discuss medical oncology the timing of our treatments so as not to interfere with his FOLFOX chemotherapy. Risks and benefits of treatment including fatigue possibility of dysphasia secondary to esophagitis, alteration of blood counts, all were discussed in detail with the patient and his family. I have set up a CT simulation shortly after his PET/CT scan. Patient and family both comprehend my treatment plan well.  I would like to take this opportunity to thank you for allowing me to participate in the care of your patient.Armstead Peaks., MD

## 2017-01-21 ENCOUNTER — Ambulatory Visit: Payer: Medicare Other | Admitting: Hematology and Oncology

## 2017-01-21 ENCOUNTER — Other Ambulatory Visit: Payer: Medicare Other

## 2017-01-21 ENCOUNTER — Institutional Professional Consult (permissible substitution): Payer: Medicare Other | Admitting: Radiation Oncology

## 2017-01-21 NOTE — Telephone Encounter (Signed)
Thank you, [name] has been made aware.

## 2017-01-22 ENCOUNTER — Institutional Professional Consult (permissible substitution): Payer: Medicare Other | Admitting: Radiation Oncology

## 2017-01-25 ENCOUNTER — Inpatient Hospital Stay: Payer: Medicare Other

## 2017-01-25 ENCOUNTER — Inpatient Hospital Stay (HOSPITAL_BASED_OUTPATIENT_CLINIC_OR_DEPARTMENT_OTHER): Payer: Medicare Other | Admitting: Hematology and Oncology

## 2017-01-25 ENCOUNTER — Other Ambulatory Visit: Payer: Self-pay | Admitting: *Deleted

## 2017-01-25 ENCOUNTER — Encounter: Payer: Self-pay | Admitting: Hematology and Oncology

## 2017-01-25 VITALS — BP 124/69 | Temp 96.8°F | Resp 18 | Wt 265.0 lb

## 2017-01-25 DIAGNOSIS — C189 Malignant neoplasm of colon, unspecified: Secondary | ICD-10-CM

## 2017-01-25 DIAGNOSIS — Z79899 Other long term (current) drug therapy: Secondary | ICD-10-CM

## 2017-01-25 DIAGNOSIS — C3412 Malignant neoplasm of upper lobe, left bronchus or lung: Secondary | ICD-10-CM | POA: Diagnosis not present

## 2017-01-25 DIAGNOSIS — D509 Iron deficiency anemia, unspecified: Secondary | ICD-10-CM | POA: Insufficient documentation

## 2017-01-25 DIAGNOSIS — C184 Malignant neoplasm of transverse colon: Secondary | ICD-10-CM | POA: Diagnosis not present

## 2017-01-25 DIAGNOSIS — K2970 Gastritis, unspecified, without bleeding: Secondary | ICD-10-CM

## 2017-01-25 DIAGNOSIS — F17210 Nicotine dependence, cigarettes, uncomplicated: Secondary | ICD-10-CM

## 2017-01-25 DIAGNOSIS — C778 Secondary and unspecified malignant neoplasm of lymph nodes of multiple regions: Secondary | ICD-10-CM

## 2017-01-25 DIAGNOSIS — Z7189 Other specified counseling: Secondary | ICD-10-CM

## 2017-01-25 DIAGNOSIS — Z9049 Acquired absence of other specified parts of digestive tract: Secondary | ICD-10-CM

## 2017-01-25 DIAGNOSIS — R197 Diarrhea, unspecified: Secondary | ICD-10-CM | POA: Diagnosis not present

## 2017-01-25 DIAGNOSIS — N400 Enlarged prostate without lower urinary tract symptoms: Secondary | ICD-10-CM | POA: Diagnosis not present

## 2017-01-25 LAB — COMPREHENSIVE METABOLIC PANEL
ALT: 34 U/L (ref 17–63)
AST: 29 U/L (ref 15–41)
Albumin: 3.9 g/dL (ref 3.5–5.0)
Alkaline Phosphatase: 70 U/L (ref 38–126)
Anion gap: 7 (ref 5–15)
BUN: 28 mg/dL — ABNORMAL HIGH (ref 6–20)
CO2: 30 mmol/L (ref 22–32)
Calcium: 9.7 mg/dL (ref 8.9–10.3)
Chloride: 100 mmol/L — ABNORMAL LOW (ref 101–111)
Creatinine, Ser: 0.96 mg/dL (ref 0.61–1.24)
GFR calc Af Amer: >60 mL/min (ref >60)
GFR calc non Af Amer: >60 mL/min (ref >60)
Glucose, Bld: 224 mg/dL — ABNORMAL HIGH (ref 65–99)
Potassium: 4 mmol/L (ref 3.5–5.1)
Sodium: 137 mmol/L (ref 135–145)
Total Bilirubin: 0.5 mg/dL (ref 0.3–1.2)
Total Protein: 7.2 g/dL (ref 6.5–8.1)

## 2017-01-25 LAB — CBC WITH DIFFERENTIAL/PLATELET
Basophils Absolute: 0 10*3/uL (ref 0–0.1)
Basophils Relative: 1 %
Eosinophils Absolute: 0.2 10*3/uL (ref 0–0.7)
Eosinophils Relative: 3 %
HCT: 35.4 % — ABNORMAL LOW (ref 40.0–52.0)
Hemoglobin: 11.5 g/dL — ABNORMAL LOW (ref 13.0–18.0)
Lymphocytes Relative: 32 %
Lymphs Abs: 1.7 10*3/uL (ref 1.0–3.6)
MCH: 29.4 pg (ref 26.0–34.0)
MCHC: 32.5 g/dL (ref 32.0–36.0)
MCV: 90.7 fL (ref 80.0–100.0)
Monocytes Absolute: 0.4 10*3/uL (ref 0.2–1.0)
Monocytes Relative: 7 %
Neutro Abs: 3 10*3/uL (ref 1.4–6.5)
Neutrophils Relative %: 57 %
Platelets: 209 10*3/uL (ref 150–440)
RBC: 3.9 MIL/uL — ABNORMAL LOW (ref 4.40–5.90)
RDW: 15.6 % — ABNORMAL HIGH (ref 11.5–14.5)
WBC: 5.3 10*3/uL (ref 3.8–10.6)

## 2017-01-25 LAB — MAGNESIUM: Magnesium: 1.9 mg/dL (ref 1.7–2.4)

## 2017-01-25 MED ORDER — HEPARIN SOD (PORK) LOCK FLUSH 100 UNIT/ML IV SOLN
500.0000 [IU] | Freq: Once | INTRAVENOUS | Status: AC
  Administered 2017-01-25: 500 [IU] via INTRAVENOUS
  Filled 2017-01-25: qty 5

## 2017-01-25 MED ORDER — SODIUM CHLORIDE 0.9% FLUSH
10.0000 mL | Freq: Once | INTRAVENOUS | Status: AC
  Administered 2017-01-25: 10 mL via INTRAVENOUS
  Filled 2017-01-25: qty 10

## 2017-01-25 NOTE — Progress Notes (Signed)
Patient here today for follow up with lab and treatment with Folfox. He denies having any pain. He is worried because he did not have a bowel movement this morning and typically he has one every morning. He is also concerned about taking allopurinol with his chemotherapy because he heard that there may be an interaction.

## 2017-01-25 NOTE — Progress Notes (Signed)
[facility] day:  01/25/2017  Chief Complaint: Donald Mcdowell. is a 71 y.o. male with stage IIIB colon cancer who is seen for assessment prior to cycle #1 FOLFOX chemotherapy.  HPI:   The patient was last seen in the medical oncology clinic on 01/14/2017.  At that time, he was seen for reassessment after interval colon surgery.  He felt "ok".  He had diarrhea (between 2-4 episodes daily) post colectomy.  Weight is down 15 pounds.  We discussed adjuvant chemotherapy.  He underwent port-a-cath placement on 01/18/2017. He attended the chemotherapy class on 01/19/2017.    He was seen by Dr. Baruch Gouty on 01/20/2017.  PET scan was ordered to assess the mediastinum.  SBRT to the left upper lobe of 5000 cGy in 5 fractions was proposed.  PET scan is scheduled for 01/26/2017.   Symptomatically, patient doing "ok". Previously reported diarrhea has improved. He notes that when sutures were removed on 01/08/2017, he had a formed stool. Since that time, patient has continued to have loose stools.  Patient has no other physical complaints today.  He denies recent infections.  He is eating well.  He has lost 2 pounds.  Patient continues on oral iron supplementation BID.    Past Medical History:  Diagnosis Date  . Abdominal aortic atherosclerosis ([place]) 09/15/2016   CT scan June 2018  . Allergy   . Cancer ([place])   . Coronary artery calcification seen on CAT scan 09/15/2016   Previous smoker; noted on CT scan June 2018  . Diabetes mellitus without complication ([place])   . GERD (gastroesophageal reflux disease)   . Gout   . Hyperlipidemia   . Hypertension   . Kidney function abnormal    stage 2  . Kidney stones   . Obesity 08/20/2015    Past Surgical History:  Procedure Laterality Date  . kidney stones      Family History  Problem Relation Age of Onset  . Alzheimer's disease Mother   . Heart disease Father   . Diabetes Father   . Cancer Father         Prostate  . Cancer Sister        skin cancer    Social History:  reports that he quit smoking about 7 months ago. His smoking use included cigarettes. he has never used smokeless tobacco. He reports that he does not drink alcohol or use drugs.  Patient worked for a [employer].  He retired in 1996.  He was in his 68s. Patient was a 1-2 pack per day smoker since he was a child.  He stopped smoking in 07/2016.  He drank alcohol in the past.  He has 3 living sisters (1 sister died).  He has no children.  The patient is accompanied by his wife [name]) today.  Communicate with neighbor [name]  [phone].  Allergies: No Known Allergies  Current Medications: Current Outpatient Medications  Medication Sig Dispense Refill  . allopurinol (ZYLOPRIM) 300 MG tablet Take 1 tablet (300 mg total) by mouth daily. 90 tablet 3  . Alpha-Lipoic Acid 600 MG CAPS Take 1 capsule 2 (two) times daily by mouth.    . Ascorbic Acid (VITAMIN C PO) Take 1 tablet by mouth daily.    . Cyanocobalamin (VITAMIN B-12 PO) Take 1 tablet by mouth daily.    Marland Kitchen erythromycin base (E-MYCIN) 500 MG tablet   0  . ferrous sulfate 324 (65 Fe) MG TBEC  Take 1 tablet by mouth 2 (two) times daily.     Marland Kitchen glipiZIDE (GLUCOTROL XL) 10 MG 24 hr tablet Take 1 tablet (10 mg total) by mouth 2 (two) times daily. 180 tablet 1  . glucose blood (ACCU-CHEK COMPACT STRIPS) test strip 100 each by Other route as needed for other. Use as instructed, check FSBS once a day; LON 99 months, E11.9 100 each 1  . lidocaine-prilocaine (EMLA) cream Apply to affected area once 30 g 3  . LORazepam (ATIVAN) 0.5 MG tablet Take 1 tablet (0.5 mg total) every 6 (six) hours as needed by mouth (Nausea or vomiting). 30 tablet 0  . Magnesium Oxide 250 MG TABS Take 250 mg by mouth every morning.     . metFORMIN (GLUCOPHAGE) 1000 MG tablet TAKE 1/2 (ONE-HALF) TABLET BY MOUTH TWICE DAILY 90 tablet 0  . mupirocin ointment (BACTROBAN) 2 %   0  . neomycin (MYCIFRADIN)  500 MG tablet   0  . Omega-3 Fatty Acids (FISH OIL) 1200 MG CAPS Take 1,200 mg by mouth 2 (two) times daily.     . ondansetron (ZOFRAN) 8 MG tablet Take 1 tablet (8 mg total) 2 (two) times daily as needed by mouth for refractory nausea / vomiting. Start on day 3 after chemotherapy. 30 tablet 1  . quinapril (ACCUPRIL) 5 MG tablet Take 1 tablet (5 mg total) by mouth daily. 30 tablet 5  . rosuvastatin (CRESTOR) 20 MG tablet Take 0.5 tablets (10 mg total) by mouth daily. (Patient taking differently: Take 10 mg by mouth at bedtime. TAKES 0.5 TABLET) 45 tablet 1  . saw palmetto 160 MG capsule Take 160 mg 2 (two) times daily by mouth.    . tamsulosin (FLOMAX) 0.4 MG CAPS capsule Take 0.4 mg by mouth daily.    Marland Kitchen HYDROcodone-acetaminophen (NORCO/VICODIN) 5-325 MG tablet Take 1-2 tablets by mouth every 6 (six) hours as needed for moderate pain. (Patient not taking: Reported on 01/08/2017) 30 tablet 0   No current facility-administered medications for this visit.    Facility-Administered Medications Ordered in Other Visits  Medication Dose Route Frequency Provider Last Rate Last Dose  . heparin lock flush 100 unit/mL  500 Units Intravenous Once Lequita Asal, MD        Review of Systems:  GENERAL:  Feels "ok".  No fevers or sweats.  Intentional weight loss (272 pounds in 04/2016). Weight down 2 pounds since last visit. PERFORMANCE STATUS (ECOG):  1 HEENT:  No visual changes, runny nose, sore throat, mouth sores or tenderness. Lungs: No shortness of breath or cough.  No hemoptysis. Cardiac:  No chest pain, palpitations, orthopnea, or PND. GI:  Loose stool (see HPI).  No nausea, vomiting, constipation, or melena. GU:  No urgency, frequency, dysuria, or hematuria. Musculoskeletal:  No back pain.  No joint pain.  No muscle tenderness. Extremities:  No pain or swelling. Skin:  No rashes or skin changes. Neuro:  No headache, numbness or weakness, balance or coordination issues. Endocrine:  Diabetes.   No thyroid issues, hot flashes or night sweats. Psych:  No mood changes, depression or anxiety. Pain:  No focal pain. Review of systems:  All other systems reviewed and found to be negative.  Physical Exam: Blood pressure 124/69, temperature (!) 96.8 F (36 C), temperature source Tympanic, resp. rate 18, weight 265 lb (120.2 kg). GENERAL:  Well developed, well nourished, heavyset gentleman sitting comfortably in the exam room in no acute distress. MENTAL STATUS:  Alert and oriented to person,  place and time. HEAD:  Crista Elliot a black cap.  Thin gray hair.  Male pattern baldness.  Normocephalic, atraumatic, face symmetric, no Cushingoid features. EYES:  Glasses.  Blue eyes.  Pupils equal round and reactive to light and accomodation.  No conjunctivitis or scleral icterus. ENT:  Oropharynx clear without lesion.  Tongue normal. Mucous membranes moist.  RESPIRATORY:  Clear to auscultation without rales, wheezes or rhonchi. CARDIOVASCULAR:  Regular rate and rhythm without murmur, rub or gallop. CHEST WALL:  Port-a-cath in place.  Bruising around port. ABDOMEN:  Midline abdominal incision.  Fully round.  Soft, non-tender, with active bowel sounds, and no appreciable hepatosplenomegaly.  No masses. SKIN:  No rashes, ulcers or lesions. EXTREMITIES: No edema, no skin discoloration or tenderness.  No palpable cords. LYMPH NODES: No palpable cervical, supraclavicular, axillary or inguinal adenopathy  NEUROLOGICAL: Unremarkable. PSYCH:  Appropriate.   Infusion on 01/25/2017  Component Date Value Ref Range Status  . Sodium 01/25/2017 137  135 - 145 mmol/L Final  . Potassium 01/25/2017 4.0  3.5 - 5.1 mmol/L Final  . Chloride 01/25/2017 100* 101 - 111 mmol/L Final  . CO2 01/25/2017 30  22 - 32 mmol/L Final  . Glucose, Bld 01/25/2017 224* 65 - 99 mg/dL Final  . BUN 01/25/2017 28* 6 - 20 mg/dL Final  . Creatinine, Ser 01/25/2017 0.96  0.61 - 1.24 mg/dL Final  . Calcium 01/25/2017 9.7  8.9 - 10.3 mg/dL  Final  . Total Protein 01/25/2017 7.2  6.5 - 8.1 g/dL Final  . Albumin 01/25/2017 3.9  3.5 - 5.0 g/dL Final  . AST 01/25/2017 29  15 - 41 U/L Final  . ALT 01/25/2017 34  17 - 63 U/L Final  . Alkaline Phosphatase 01/25/2017 70  38 - 126 U/L Final  . Total Bilirubin 01/25/2017 0.5  0.3 - 1.2 mg/dL Final  . GFR calc non Af Amer 01/25/2017 >60  >60 mL/min Final  . GFR calc Af Amer 01/25/2017 >60  >60 mL/min Final   Comment: (NOTE) The eGFR has been calculated using the CKD EPI equation. This calculation has not been validated in all clinical situations. eGFR's persistently <60 mL/min signify possible Chronic Kidney Disease.   . Anion gap 01/25/2017 7  5 - 15 Final  . Magnesium 01/25/2017 1.9  1.7 - 2.4 mg/dL Final  . WBC 01/25/2017 5.3  3.8 - 10.6 K/uL Final  . RBC 01/25/2017 3.90* 4.40 - 5.90 MIL/uL Final  . Hemoglobin 01/25/2017 11.5* 13.0 - 18.0 g/dL Final  . HCT 01/25/2017 35.4* 40.0 - 52.0 % Final  . MCV 01/25/2017 90.7  80.0 - 100.0 fL Final  . MCH 01/25/2017 29.4  26.0 - 34.0 pg Final  . MCHC 01/25/2017 32.5  32.0 - 36.0 g/dL Final  . RDW 01/25/2017 15.6* 11.5 - 14.5 % Final  . Platelets 01/25/2017 209  150 - 440 K/uL Final  . Neutrophils Relative % 01/25/2017 57  % Final  . Neutro Abs 01/25/2017 3.0  1.4 - 6.5 K/uL Final  . Lymphocytes Relative 01/25/2017 32  % Final  . Lymphs Abs 01/25/2017 1.7  1.0 - 3.6 K/uL Final  . Monocytes Relative 01/25/2017 7  % Final  . Monocytes Absolute 01/25/2017 0.4  0.2 - 1.0 K/uL Final  . Eosinophils Relative 01/25/2017 3  % Final  . Eosinophils Absolute 01/25/2017 0.2  0 - 0.7 K/uL Final  . Basophils Relative 01/25/2017 1  % Final  . Basophils Absolute 01/25/2017 0.0  0 - 0.1 K/uL Final  Assessment:  Donald Lamarque. is a 71 y.o. male with stage IIIB transverse colon cancer s/p right partial colectomy on 12/28/2016.  Pathology revealed a 5.5 cm grade II invasive adenocarcinoma of the transverse colon, extending through the muscularis  propria and into the adjacent adipose tissue, including the greater omentum. There was metastatic adenocarcinoma in multiple lymph nodes and tumor deposits, at least 5 involved with a total of at least 18 lymph nodes (5 of 18).  Pathologic stage was T3N2a.  CEA was 1.4 on 11/09/2016.  Abdomen and pelvic CT on 11/09/2016 revealed mid transverse colon segment of luminal narrowing with wall thickening compatible with colon cancer.  There was a single enlarged adjacent mesenteric lymph node and prominent subcentimeter lymph nodes anterior to the pancreas which may be metastatic.  There was no evidence of distant metastasis.  There was a left kidney lower pole complex cyst with thin internal septations (Bosniak IIF).  His prostate was enlarged and extended into the floor of bladder.  There was right kidney lower pole punctate nonobstructing nephrolithiasis.  Chest CT on 11/18/2016 revealed no metastatic disease.  There was abrupt termination of the left upper lobe beyond which the distal bronchus appeared dilated with mild branching into the lung parenchyma.  Imaging suggested a bronchocele in the medial aspect of the left upper lobe. Differential included an endobronchial polyp, endobronchial tumor, bronchial stricture or aspirated foreign body.   Electromagnetic navigational bronchoscopy (ENB) on 12/16/2016 revealed non-small cell carcinoma, favor squamous cell lung carcinoma.  Tumor was positive for p40 and negative for cdx-2 and TTF-1.  SBRT to the left upper lobe of 5000 cGy in 5 fractions has been proposed.  EGD on 11/05/2016 revealed gastritis.  Pathology confirmed chronic mild gastritis and features of a healing erosion in the stomach.  There was no H pylori, dysplasia or malignancy.  He has iron deficiency anemia.  He is on oral iron.  Ferritin was 6 on 09/04/2016.  Hematocrit was 29.7 and hemoglobin 8.8 on 09/04/2016.  Hematocrit was 28.1, hemoglobin 9.3, and MCV 87.9 on 11/09/2016.    Symptomatically, he is recovering well from his colectomy. He has loose stools. He denies pain.  Exam reveals a midline abdominal incision without evidence of infection.  WBC is 5300 with an [place] of 3000. Hemoglobin is 11.5, hematocrit 35.3, and platelets 209,000.  Plan: 1.  Discuss postponing initial cycle of FOLFOX until first week in December secondary to upcoming lung radiation. Review chemotherapy plan.  Side effects reviewed.  Patient will be seen every 2 weeks for 12 cycles of FOLFOX chemotherapy.  Labs will be drawn at each visit prior to chemotherapy. After treatment completed, patient will be seen in clinic every 3 months for the first year, every 4 months for the second year, and every 6 months for the third through the [place] year.  Scans will be performed at the end of treatment and yearly x 5 years. 2.  PET scan scheduled for 01/26/2017. 3.  SBRT to the left upper lobe of 5000 cGy in 5 fractions planned to begin next week. Simulation scheduled on Thursday.  4.  Discuss iron deficiency. Encouraged patient to increase oral iron to three times a day to be taken with vitamin C. This will potentially help with his loose stools as well.  5.  RTC in 3 weeks for MD assessment, labs (CBC with diff, CMP, Mg), and cycle #1 FOLFOX (new)   [name], NP  01/25/2017, 9:29 AM   I saw and  evaluated the patient, participating in the key portions of the service and reviewing pertinent diagnostic studies and records. I reviewed the nurse practitioner's note and agree with the findings and the plan.  The assessment and plan were discussed with the patient. Multiple questions were asked by the patient and answered.   Lequita Asal, MD 01/25/2017 ,9:29 AM

## 2017-01-26 ENCOUNTER — Ambulatory Visit
Admission: RE | Admit: 2017-01-26 | Discharge: 2017-01-26 | Disposition: A | Discharge status: Home / Self Care | Payer: Medicare Other | Source: Ambulatory Visit | Attending: Radiation Oncology | Admitting: Radiation Oncology

## 2017-01-26 DIAGNOSIS — C3412 Malignant neoplasm of upper lobe, left bronchus or lung: Secondary | ICD-10-CM | POA: Insufficient documentation

## 2017-01-26 DIAGNOSIS — Z9049 Acquired absence of other specified parts of digestive tract: Secondary | ICD-10-CM | POA: Insufficient documentation

## 2017-01-26 DIAGNOSIS — Z79899 Other long term (current) drug therapy: Secondary | ICD-10-CM | POA: Diagnosis not present

## 2017-01-26 LAB — GLUCOSE, CAPILLARY: Glucose-Capillary: 131 mg/dL — ABNORMAL HIGH (ref 65–99)

## 2017-01-26 MED ORDER — FLUDEOXYGLUCOSE F - 18 (FDG) INJECTION
12.0000 | Freq: Once | INTRAVENOUS | Status: AC | PRN
  Administered 2017-01-26: 13.05 via INTRAVENOUS

## 2017-01-28 ENCOUNTER — Ambulatory Visit
Admission: RE | Admit: 2017-01-28 | Discharge: 2017-01-28 | Disposition: A | Discharge status: Home / Self Care | Payer: Medicare Other | Source: Ambulatory Visit | Attending: Radiation Oncology | Admitting: Radiation Oncology

## 2017-01-28 DIAGNOSIS — C3412 Malignant neoplasm of upper lobe, left bronchus or lung: Secondary | ICD-10-CM | POA: Diagnosis not present

## 2017-01-28 DIAGNOSIS — Z87891 Personal history of nicotine dependence: Secondary | ICD-10-CM | POA: Diagnosis not present

## 2017-01-31 ENCOUNTER — Encounter: Payer: Self-pay | Admitting: Hematology and Oncology

## 2017-02-03 NOTE — Anesthesia Postprocedure Evaluation (Signed)
Anesthesia Post Note  Patient: Forney Kleinpeter.  Procedure(s) Performed: INSERTION PORT-A-CATH (Right Chest)  Patient location during evaluation: PACU Anesthesia Type: General Level of consciousness: awake and alert Pain management: pain level controlled Vital Signs Assessment: post-procedure vital signs reviewed and stable Respiratory status: spontaneous breathing, nonlabored ventilation, respiratory function stable and patient connected to nasal cannula oxygen Cardiovascular status: blood pressure returned to baseline and stable Postop Assessment: no apparent nausea or vomiting Anesthetic complications: no     Last Vitals:  Vitals:   01/18/17 1334 01/18/17 1355  BP: (!) 147/64 (!) 146/58  Pulse: 67 67  Resp: 16 17  Temp: 36.5 C   SpO2: 98% 100%    Last Pain:  Vitals:   01/21/17 1027  TempSrc:   PainSc: 0-No pain                 Molli Barrows

## 2017-02-08 DIAGNOSIS — Z87891 Personal history of nicotine dependence: Secondary | ICD-10-CM | POA: Diagnosis not present

## 2017-02-08 DIAGNOSIS — C3412 Malignant neoplasm of upper lobe, left bronchus or lung: Secondary | ICD-10-CM | POA: Diagnosis not present

## 2017-02-12 ENCOUNTER — Telehealth: Payer: Self-pay | Admitting: *Deleted

## 2017-02-12 NOTE — Telephone Encounter (Signed)
Pt calling in to see when his chemo treatments have been rescheduled. He is scheduled to start SBRT on 12/4 and will finish on 12/18. Please advise.

## 2017-02-12 NOTE — Telephone Encounter (Signed)
I have spoken with Dr. Mike Gip. We have offered a family meeting with the patient and family to further discuss his questions and concerns, however he has declined. Per Dr. Mike Gip, the chemotherapy treatments will be scheduled following the completion of SBRT. Refer to the last clinic notes that indicate that the patient was to start radiation treatments "next week". Dr. Mike Gip was under the impression that he had already started treatments. I would say that patient preference needs to considered as well. With the upcoming holiday, when would he like to begin? Once I get the go ahead, I will let you know when to schedule him.

## 2017-02-15 ENCOUNTER — Inpatient Hospital Stay: Payer: Medicare Other | Admitting: Hematology and Oncology

## 2017-02-15 ENCOUNTER — Other Ambulatory Visit: Payer: Self-pay | Admitting: Hematology and Oncology

## 2017-02-15 ENCOUNTER — Inpatient Hospital Stay: Payer: Medicare Other

## 2017-02-15 ENCOUNTER — Inpatient Hospital Stay: Payer: Medicare Other | Attending: Hematology and Oncology

## 2017-02-15 DIAGNOSIS — Z7984 Long term (current) use of oral hypoglycemic drugs: Secondary | ICD-10-CM | POA: Insufficient documentation

## 2017-02-15 DIAGNOSIS — I10 Essential (primary) hypertension: Secondary | ICD-10-CM | POA: Insufficient documentation

## 2017-02-15 DIAGNOSIS — D509 Iron deficiency anemia, unspecified: Secondary | ICD-10-CM

## 2017-02-15 DIAGNOSIS — C184 Malignant neoplasm of transverse colon: Secondary | ICD-10-CM | POA: Insufficient documentation

## 2017-02-15 DIAGNOSIS — F17210 Nicotine dependence, cigarettes, uncomplicated: Secondary | ICD-10-CM | POA: Insufficient documentation

## 2017-02-15 DIAGNOSIS — E785 Hyperlipidemia, unspecified: Secondary | ICD-10-CM | POA: Insufficient documentation

## 2017-02-15 DIAGNOSIS — M109 Gout, unspecified: Secondary | ICD-10-CM | POA: Insufficient documentation

## 2017-02-15 DIAGNOSIS — E119 Type 2 diabetes mellitus without complications: Secondary | ICD-10-CM | POA: Insufficient documentation

## 2017-02-15 DIAGNOSIS — N400 Enlarged prostate without lower urinary tract symptoms: Secondary | ICD-10-CM | POA: Insufficient documentation

## 2017-02-15 DIAGNOSIS — K2970 Gastritis, unspecified, without bleeding: Secondary | ICD-10-CM | POA: Insufficient documentation

## 2017-02-15 DIAGNOSIS — K219 Gastro-esophageal reflux disease without esophagitis: Secondary | ICD-10-CM | POA: Insufficient documentation

## 2017-02-15 DIAGNOSIS — R339 Retention of urine, unspecified: Secondary | ICD-10-CM

## 2017-02-15 DIAGNOSIS — R197 Diarrhea, unspecified: Secondary | ICD-10-CM | POA: Insufficient documentation

## 2017-02-15 DIAGNOSIS — Z79899 Other long term (current) drug therapy: Secondary | ICD-10-CM | POA: Insufficient documentation

## 2017-02-15 DIAGNOSIS — E669 Obesity, unspecified: Secondary | ICD-10-CM | POA: Insufficient documentation

## 2017-02-15 DIAGNOSIS — Z9049 Acquired absence of other specified parts of digestive tract: Secondary | ICD-10-CM | POA: Insufficient documentation

## 2017-02-15 DIAGNOSIS — Z87442 Personal history of urinary calculi: Secondary | ICD-10-CM | POA: Insufficient documentation

## 2017-02-15 DIAGNOSIS — I2510 Atherosclerotic heart disease of native coronary artery without angina pectoris: Secondary | ICD-10-CM | POA: Insufficient documentation

## 2017-02-15 DIAGNOSIS — C3412 Malignant neoplasm of upper lobe, left bronchus or lung: Secondary | ICD-10-CM | POA: Insufficient documentation

## 2017-02-15 DIAGNOSIS — Z5111 Encounter for antineoplastic chemotherapy: Secondary | ICD-10-CM | POA: Insufficient documentation

## 2017-02-15 DIAGNOSIS — C778 Secondary and unspecified malignant neoplasm of lymph nodes of multiple regions: Secondary | ICD-10-CM | POA: Insufficient documentation

## 2017-02-15 NOTE — Progress Notes (Deleted)
[facility] day:  02/15/2017  Chief Complaint: Donald Mcdowell. is a 71 y.o. male with stage IIIB colon cancer who is seen for reassessment.  HPI:   The patient was last seen in the medical oncology clinic on 01/25/2017.  At that time, he was doing "ok".  He had loose stools s/p right partial colectomy.  He had not begun radiation to the left upper lobe.  PET scan was pending.  PET scan on 01/26/2017 revealed a 2.4 x 0.9 cm hypermetabolic medial left upper lobe pulmonary nodule, corresponding to known primary bronchogenic neoplasm.  There was no hypermetabolic thoracic adenopathy or evidence of metastatic disease.  During the interim,   Past Medical History:  Diagnosis Date  . Abdominal aortic atherosclerosis ([place]) 09/15/2016   CT scan June 2018  . Allergy   . Cancer ([place])   . Coronary artery calcification seen on CAT scan 09/15/2016   Previous smoker; noted on CT scan June 2018  . Diabetes mellitus without complication ([place])   . GERD (gastroesophageal reflux disease)   . Gout   . Hyperlipidemia   . Hypertension   . Mcdowell function abnormal    stage 2  . Mcdowell stones   . Obesity 08/20/2015    Past Surgical History:  Procedure Laterality Date  . COLONOSCOPY WITH PROPOFOL N/A 11/05/2016   Procedure: COLONOSCOPY WITH PROPOFOL;  Surgeon: Lucilla Lame, MD;  Location: [place];  Service: Gastroenterology;  Laterality: N/A;  . ELECTROMAGNETIC NAVIGATION BROCHOSCOPY N/A 12/16/2016   Procedure: ELECTROMAGNETIC NAVIGATION BRONCHOSCOPY;  Surgeon: Flora Lipps, MD;  Location: [facility];  Service: Cardiopulmonary;  Laterality: N/A;  . ESOPHAGOGASTRODUODENOSCOPY (EGD) WITH PROPOFOL N/A 11/05/2016   Procedure: ESOPHAGOGASTRODUODENOSCOPY (EGD) WITH PROPOFOL;  Surgeon: Lucilla Lame, MD;  Location: [place];  Service: Gastroenterology;  Laterality: N/A;  Diabetic - oral meds  . Mcdowell stones    . PARTIAL COLECTOMY N/A 12/28/2016    Procedure: PARTIAL COLECTOMY RIGHT;  Surgeon: Jules Husbands, MD;  Location: [facility];  Service: General;  Laterality: N/A;  . POLYPECTOMY N/A 11/05/2016   Procedure: POLYPECTOMY;  Surgeon: Lucilla Lame, MD;  Location: [place];  Service: Gastroenterology;  Laterality: N/A;  . PORTACATH PLACEMENT Right 01/18/2017   Procedure: INSERTION PORT-A-CATH;  Surgeon: Jules Husbands, MD;  Location: [facility];  Service: General;  Laterality: Right;    Family History  Problem Relation Age of Onset  . Alzheimer's disease Mother   . Heart disease Father   . Diabetes Father   . Cancer Father        Prostate  . Cancer Sister        skin cancer    Social History:  reports that he quit smoking about 8 months ago. His smoking use included cigarettes. he has never used smokeless tobacco. He reports that he does not drink alcohol or use drugs.  Patient worked for a [employer].  He retired in 1996.  He was in his 50s. Patient was a 1-2 pack per day smoker since he was a child.  He stopped smoking in 07/2016.  He drank alcohol in the past.  He has 3 living sisters (1 sister died).  He has no children.  The patient is accompanied by his wife Donald Mcdowell) today.  Communicate with neighbor [name]  [phone].  Allergies: No Known Allergies  Current Medications: Current Outpatient Medications  Medication Sig Dispense Refill  . allopurinol (ZYLOPRIM) 300 MG tablet Take  1 tablet (300 mg total) by mouth daily. 90 tablet 3  . Alpha-Lipoic Acid 600 MG CAPS Take 1 capsule 2 (two) times daily by mouth.    . Ascorbic Acid (VITAMIN C PO) Take 1 tablet by mouth daily.    . Cyanocobalamin (VITAMIN B-12 PO) Take 1 tablet by mouth daily.    Marland Kitchen erythromycin base (E-MYCIN) 500 MG tablet   0  . ferrous sulfate 324 (65 Fe) MG TBEC Take 1 tablet by mouth 2 (two) times daily.     Marland Kitchen glipiZIDE (GLUCOTROL XL) 10 MG 24 hr tablet Take 1 tablet (10 mg total) by mouth 2 (two) times daily. 180 tablet 1  . glucose  blood (ACCU-CHEK COMPACT STRIPS) test strip 100 each by Other route as needed for other. Use as instructed, check FSBS once a day; LON 99 months, E11.9 100 each 1  . HYDROcodone-acetaminophen (NORCO/VICODIN) 5-325 MG tablet Take 1-2 tablets by mouth every 6 (six) hours as needed for moderate pain. (Patient not taking: Reported on 01/08/2017) 30 tablet 0  . lidocaine-prilocaine (EMLA) cream Apply to affected area once 30 g 3  . LORazepam (ATIVAN) 0.5 MG tablet Take 1 tablet (0.5 mg total) every 6 (six) hours as needed by mouth (Nausea or vomiting). 30 tablet 0  . Magnesium Oxide 250 MG TABS Take 250 mg by mouth every morning.     . metFORMIN (GLUCOPHAGE) 1000 MG tablet TAKE 1/2 (ONE-HALF) TABLET BY MOUTH TWICE DAILY 90 tablet 0  . mupirocin ointment (BACTROBAN) 2 %   0  . neomycin (MYCIFRADIN) 500 MG tablet   0  . Omega-3 Fatty Acids (FISH OIL) 1200 MG CAPS Take 1,200 mg by mouth 2 (two) times daily.     . ondansetron (ZOFRAN) 8 MG tablet Take 1 tablet (8 mg total) 2 (two) times daily as needed by mouth for refractory nausea / vomiting. Start on day 3 after chemotherapy. 30 tablet 1  . quinapril (ACCUPRIL) 5 MG tablet Take 1 tablet (5 mg total) by mouth daily. 30 tablet 5  . rosuvastatin (CRESTOR) 20 MG tablet Take 0.5 tablets (10 mg total) by mouth daily. (Patient taking differently: Take 10 mg by mouth at bedtime. TAKES 0.5 TABLET) 45 tablet 1  . saw palmetto 160 MG capsule Take 160 mg 2 (two) times daily by mouth.    . tamsulosin (FLOMAX) 0.4 MG CAPS capsule Take 0.4 mg by mouth daily.     No current facility-administered medications for this visit.     Review of Systems:  GENERAL:  Feels "ok".  No fevers or sweats.  Intentional weight loss (272 pounds in 04/2016). Weight down 2 pounds since last visit. PERFORMANCE STATUS (ECOG):  1 HEENT:  No visual changes, runny nose, sore throat, mouth sores or tenderness. Lungs: No shortness of breath or cough.  No hemoptysis. Cardiac:  No chest pain,  palpitations, orthopnea, or PND. GI:  Loose stool (see HPI).  No nausea, vomiting, constipation, or melena. GU:  No urgency, frequency, dysuria, or hematuria. Musculoskeletal:  No back pain.  No joint pain.  No muscle tenderness. Extremities:  No pain or swelling. Skin:  No rashes or skin changes. Neuro:  No headache, numbness or weakness, balance or coordination issues. Endocrine:  Diabetes.  No thyroid issues, hot flashes or night sweats. Psych:  No mood changes, depression or anxiety. Pain:  No focal pain. Review of systems:  All other systems reviewed and found to be negative.  Physical Exam: There were no vitals taken for  this visit. GENERAL:  Well developed, well nourished, heavyset gentleman sitting comfortably in the exam room in no acute distress. MENTAL STATUS:  Alert and oriented to person, place and time. HEAD:  Crista Elliot a black cap.  Thin gray hair.  Male pattern baldness.  Normocephalic, atraumatic, face symmetric, no Cushingoid features. EYES:  Glasses.  Blue eyes.  Pupils equal round and reactive to light and accomodation.  No conjunctivitis or scleral icterus. ENT:  Oropharynx clear without lesion.  Tongue normal. Mucous membranes moist.  RESPIRATORY:  Clear to auscultation without rales, wheezes or rhonchi. CARDIOVASCULAR:  Regular rate and rhythm without murmur, rub or gallop. CHEST WALL:  Port-a-cath in place.  Bruising around port. ABDOMEN:  Midline abdominal incision.  Fully round.  Soft, non-tender, with active bowel sounds, and no appreciable hepatosplenomegaly.  No masses. SKIN:  No rashes, ulcers or lesions. EXTREMITIES: No edema, no skin discoloration or tenderness.  No palpable cords. LYMPH NODES: No palpable cervical, supraclavicular, axillary or inguinal adenopathy  NEUROLOGICAL: Unremarkable. PSYCH:  Appropriate.   No visits with results within 3 Day(s) from this visit.  Latest known visit with results is:  Hospital Outpatient Visit on 01/26/2017   Component Date Value Ref Range Status  . Glucose-Capillary 01/26/2017 131* 65 - 99 mg/dL Final    Assessment:  Ashely Joshua. is a 71 y.o. male with stage IIIB transverse colon cancer s/p right partial colectomy on 12/28/2016.  Pathology revealed a 5.5 cm grade II invasive adenocarcinoma of the transverse colon, extending through the muscularis propria and into the adjacent adipose tissue, including the greater omentum. There was metastatic adenocarcinoma in multiple lymph nodes and tumor deposits, at least 5 involved with a total of at least 18 lymph nodes (5 of 18).  Pathologic stage was T3N2a.  CEA was 1.4 on 11/09/2016.  Abdomen and pelvic CT on 11/09/2016 revealed mid transverse colon segment of luminal narrowing with wall thickening compatible with colon cancer.  There was a single enlarged adjacent mesenteric lymph node and prominent subcentimeter lymph nodes anterior to the pancreas which may be metastatic.  There was no evidence of distant metastasis.  There was a left Mcdowell lower pole complex cyst with thin internal septations (Bosniak IIF).  His prostate was enlarged and extended into the floor of bladder.  There was right Mcdowell lower pole punctate nonobstructing nephrolithiasis.  Chest CT on 11/18/2016 revealed no metastatic disease.  There was abrupt termination of the left upper lobe beyond which the distal bronchus appeared dilated with mild branching into the lung parenchyma.  Imaging suggested a bronchocele in the medial aspect of the left upper lobe. Differential included an endobronchial polyp, endobronchial tumor, bronchial stricture or aspirated foreign body.   Electromagnetic navigational bronchoscopy (ENB) on 12/16/2016 revealed non-small cell carcinoma, favor squamous cell lung carcinoma.  Tumor was positive for p40 and negative for cdx-2 and TTF-1.  SBRT to the left upper lobe of 5000 cGy in 5 fractions has been proposed.  PET scan on 01/26/2017 revealed a 2.4 x 0.9 cm  hypermetabolic medial left upper lobe pulmonary nodule, corresponding to known primary bronchogenic neoplasm.  There was no hypermetabolic thoracic adenopathy or evidence of metastatic disease.  EGD on 11/05/2016 revealed gastritis.  Pathology confirmed chronic mild gastritis and features of a healing erosion in the stomach.  There was no H pylori, dysplasia or malignancy.  He has iron deficiency anemia.  He is on oral iron.  Ferritin was 6 on 09/04/2016.  Hematocrit was 29.7 and hemoglobin 8.8  on 09/04/2016.  Hematocrit was 28.1, hemoglobin 9.3, and MCV 87.9 on 11/09/2016.   Symptomatically, he is recovering well from his colectomy. He has loose stools. He denies pain.  Exam reveals a midline abdominal incision without evidence of infection.  WBC is 5300 with an [place] of 3000. Hemoglobin is 11.5, hematocrit 35.3, and platelets 209,000.  Plan: 1.  Discuss interval PET scan- LUL nodule without evidence of known metastatic disease.  Discuss postponing initial cycle of FOLFOX until first week in December secondary to upcoming lung radiation. Review chemotherapy plan.  Side effects reviewed.  Patient will be seen every 2 weeks for 12 cycles of FOLFOX chemotherapy.  Labs will be drawn at each visit prior to chemotherapy. After treatment completed, patient will be seen in clinic every 3 months for the first year, every 4 months for the second year, and every 6 months for the third through the [place] year.  Scans will be performed at the end of treatment and yearly x 5 years. 2.  PET scan scheduled for 01/26/2017. 3.  SBRT to the left upper lobe of 5000 cGy in 5 fractions planned to begin next week. Simulation scheduled on Thursday.  4.  Discuss iron deficiency. Encouraged patient to increase oral iron to three times a day to be taken with vitamin C. This will potentially help with his loose stools as well.  5.  RTC in 3 weeks for MD assessment, labs (CBC with diff, CMP, ferritin, Mg), and cycle #1 FOLFOX  (new)   Lequita Asal, MD  02/15/2017, 3:10 AM   I saw and evaluated the patient, participating in the key portions of the service and reviewing pertinent diagnostic studies and records. I reviewed the nurse practitioner's note and agree with the findings and the plan.  The assessment and plan were discussed with the patient. Multiple questions were asked by the patient and answered.   Lequita Asal, MD 02/15/2017 ,3:10 AM

## 2017-02-15 NOTE — Telephone Encounter (Signed)
Pt states wants to start whenever Dr. Mike Gip recommends. He has no preference.

## 2017-02-16 ENCOUNTER — Ambulatory Visit
Admission: RE | Admit: 2017-02-16 | Discharge: 2017-02-16 | Disposition: A | Discharge status: Home / Self Care | Payer: Medicare Other | Source: Ambulatory Visit | Attending: Radiation Oncology | Admitting: Radiation Oncology

## 2017-02-16 DIAGNOSIS — C3412 Malignant neoplasm of upper lobe, left bronchus or lung: Secondary | ICD-10-CM | POA: Diagnosis not present

## 2017-02-17 ENCOUNTER — Telehealth: Payer: Self-pay | Admitting: *Deleted

## 2017-02-17 ENCOUNTER — Inpatient Hospital Stay: Payer: Medicare Other

## 2017-02-17 NOTE — Telephone Encounter (Signed)
  Gaspar Bidding,  Can you call him to clarify things?  M

## 2017-02-17 NOTE — Telephone Encounter (Signed)
Please advise 

## 2017-02-17 NOTE — Telephone Encounter (Signed)
Ok, so I spoke with Dr. Mike Gip again, and she gave me the following plan. She wants to plan on starting him on 03/15/2017 if possible. He will be receiving FOLFOX, which will require him to come back on 03/17/2017 for pump disconnect. I will see him the following Monday (03/22/17)  for his nadir assessment. If this works with his plans, please schedule, and lets proceed.   Donald Mcdowell

## 2017-02-17 NOTE — Telephone Encounter (Signed)
Scheduling message has been sent to schedule pt per MD/NP instructions. Scheduling will notify pt with appt details.   Thx!

## 2017-02-17 NOTE — Telephone Encounter (Signed)
Spoke with Patient about his 02/15/17 appts. Patient stated that his appts were Cancelled and that [place]-RN told him that she would call him about a RTC date.  He also stated that she told him that she would try to get him in on 03/03/17 to see another provider to start his infusion since  Dr.Corcoran was going on vacation. Patient was a little confused about what was going on and if Hayley could give him a call. I sent Hayley and in basket and made her aware of what patient said.  She message back and stated that she forward the message to Dr. Mike Gip.  She then sent message to schedule patient on 03/15/17 for lab/MD/ *NEW* Folfox, Pump d/c on 03/17/16 and 03/22/16 for lab/see Gaspar Bidding  NP Patient was made aware of his upcoming appt. And new schedule was mailed out.

## 2017-02-17 NOTE — Telephone Encounter (Signed)
-----   Message from [place], [place] sent at 02/17/2017  9:03 AM EST ----- Regarding: Return Call Spoke with Patient about his 02/15/17 appts. Patient stated that his appts were Cancelled and that you told him that you would call him about a RTC date. He also stated that you told him that you would try to get him in on 03/03/17 to see another provider to start his infusion  since Dr.Corcoran was going on vacation. Patient was a little confused about what was going on and if you could give him a call.   Thanks,  [employer]

## 2017-02-18 ENCOUNTER — Ambulatory Visit
Admission: RE | Admit: 2017-02-18 | Discharge: 2017-02-18 | Disposition: A | Discharge status: Home / Self Care | Payer: Medicare Other | Source: Ambulatory Visit | Attending: Radiation Oncology | Admitting: Radiation Oncology

## 2017-02-18 ENCOUNTER — Ambulatory Visit: Payer: Medicare Other

## 2017-02-18 DIAGNOSIS — C3412 Malignant neoplasm of upper lobe, left bronchus or lung: Secondary | ICD-10-CM | POA: Diagnosis not present

## 2017-02-19 ENCOUNTER — Ambulatory Visit: Payer: Medicare Other

## 2017-02-23 ENCOUNTER — Ambulatory Visit: Payer: Medicare Other | Admitting: Cardiovascular Disease

## 2017-02-23 ENCOUNTER — Ambulatory Visit
Admission: RE | Admit: 2017-02-23 | Discharge: 2017-02-23 | Disposition: A | Discharge status: Home / Self Care | Payer: Medicare Other | Source: Ambulatory Visit | Attending: Radiation Oncology | Admitting: Radiation Oncology

## 2017-02-23 DIAGNOSIS — C3412 Malignant neoplasm of upper lobe, left bronchus or lung: Secondary | ICD-10-CM | POA: Diagnosis not present

## 2017-02-25 ENCOUNTER — Ambulatory Visit: Payer: Medicare Other | Admitting: Cardiovascular Disease

## 2017-02-25 ENCOUNTER — Ambulatory Visit
Admission: RE | Admit: 2017-02-25 | Discharge: 2017-02-25 | Disposition: A | Discharge status: Home / Self Care | Payer: Medicare Other | Source: Ambulatory Visit | Attending: Radiation Oncology | Admitting: Radiation Oncology

## 2017-02-25 ENCOUNTER — Encounter: Payer: Self-pay | Admitting: Cardiovascular Disease

## 2017-02-25 VITALS — BP 122/58 | Ht 71.0 in | Wt 272.2 lb

## 2017-02-25 DIAGNOSIS — I350 Nonrheumatic aortic (valve) stenosis: Secondary | ICD-10-CM

## 2017-02-25 DIAGNOSIS — E785 Hyperlipidemia, unspecified: Secondary | ICD-10-CM | POA: Diagnosis not present

## 2017-02-25 DIAGNOSIS — C3412 Malignant neoplasm of upper lobe, left bronchus or lung: Secondary | ICD-10-CM | POA: Diagnosis not present

## 2017-02-25 NOTE — Patient Instructions (Signed)
Medication Instructions: No changes.   Labwork: None.   Procedures/Testing: None.   Follow-Up: 1 year with Dr. Fletcher Anon.   Any Additional Special Instructions Will Be Listed Below (If Applicable).     If you need a refill on your cardiac medications before your next appointment, please call your pharmacy.

## 2017-02-25 NOTE — Progress Notes (Signed)
Cardiology Office Note   Date:  02/25/2017   ID:  Donald Nesheiwat., DOB December 31, 1945, MRN [id number]  PCP:  Arnetha Courser, MD  Cardiologist:   Kathlyn Sacramento, MD   Chief Complaint  Patient presents with  . other    2 month follow up from ECHO. Pt states he is doing well. Recent procedures: PARTIAL COLECTOMY RIGHT 10/15. Insertion port-a-cath 11/5.  Reviewed meds with pt verbally.      History of Present Illness: Donald Mcdowell. is a 71 y.o. male who is here today for a follow-up visit regarding mild to moderate aortic stenosis. He was seen a few months ago for preoperative cardiovascular evaluation before undergoing colectomy and bronchoscopy. He was recently diagnosed with colon cancer and lung cancer.  He has chronic medical conditions that include prolonged history of diabetes mellitus, hypertension, hyperlipidemia, morbid obesity and previous tobacco use.  The patient underwent partial colectomy and currently is undergoing radiation therapy to his lungs. Echocardiogram in October showed normal LV systolic function with mild to moderate aortic stenosis with a mean gradient of 13 mmHg with mild LVOT obstruction. He has been doing reasonably well and denies any chest pain or shortness of breath.    Past Medical History:  Diagnosis Date  . Abdominal aortic atherosclerosis ([place]) 09/15/2016   CT scan June 2018  . Allergy   . Cancer ([place])   . Coronary artery calcification seen on CAT scan 09/15/2016   Previous smoker; noted on CT scan June 2018  . Diabetes mellitus without complication ([place])   . GERD (gastroesophageal reflux disease)   . Gout   . Hyperlipidemia   . Hypertension   . Kidney function abnormal    stage 2  . Kidney stones   . Obesity 08/20/2015    Past Surgical History:  Procedure Laterality Date  . COLONOSCOPY WITH PROPOFOL N/A 11/05/2016   Procedure: COLONOSCOPY WITH PROPOFOL;  Surgeon: Lucilla Lame, MD;  Location: [place];  Service:  Gastroenterology;  Laterality: N/A;  . ELECTROMAGNETIC NAVIGATION BROCHOSCOPY N/A 12/16/2016   Procedure: ELECTROMAGNETIC NAVIGATION BRONCHOSCOPY;  Surgeon: Flora Lipps, MD;  Location: [facility];  Service: Cardiopulmonary;  Laterality: N/A;  . ESOPHAGOGASTRODUODENOSCOPY (EGD) WITH PROPOFOL N/A 11/05/2016   Procedure: ESOPHAGOGASTRODUODENOSCOPY (EGD) WITH PROPOFOL;  Surgeon: Lucilla Lame, MD;  Location: [place];  Service: Gastroenterology;  Laterality: N/A;  Diabetic - oral meds  . kidney stones    . PARTIAL COLECTOMY N/A 12/28/2016   Procedure: PARTIAL COLECTOMY RIGHT;  Surgeon: Jules Husbands, MD;  Location: [facility];  Service: General;  Laterality: N/A;  . POLYPECTOMY N/A 11/05/2016   Procedure: POLYPECTOMY;  Surgeon: Lucilla Lame, MD;  Location: [place];  Service: Gastroenterology;  Laterality: N/A;  . PORTACATH PLACEMENT Right 01/18/2017   Procedure: INSERTION PORT-A-CATH;  Surgeon: Jules Husbands, MD;  Location: [facility];  Service: General;  Laterality: Right;     Current Outpatient Medications  Medication Sig Dispense Refill  . allopurinol (ZYLOPRIM) 300 MG tablet Take 1 tablet (300 mg total) by mouth daily. 90 tablet 3  . Alpha-Lipoic Acid 600 MG CAPS Take 1 capsule 2 (two) times daily by mouth.    . Ascorbic Acid (VITAMIN C PO) Take 1 tablet by mouth daily.    . Cyanocobalamin (VITAMIN B-12 PO) Take 1 tablet by mouth daily.    Marland Kitchen erythromycin base (E-MYCIN) 500 MG tablet   0  . ferrous sulfate 324 (65 Fe) MG TBEC Take 1 tablet by  mouth 2 (two) times daily.     Marland Kitchen glipiZIDE (GLUCOTROL XL) 10 MG 24 hr tablet Take 1 tablet (10 mg total) by mouth 2 (two) times daily. 180 tablet 1  . glucose blood (ACCU-CHEK COMPACT STRIPS) test strip 100 each by Other route as needed for other. Use as instructed, check FSBS once a day; LON 99 months, E11.9 100 each 1  . HYDROcodone-acetaminophen (NORCO/VICODIN) 5-325 MG tablet Take 1-2 tablets by mouth every 6 (six) hours as needed for  moderate pain. 30 tablet 0  . lidocaine-prilocaine (EMLA) cream Apply to affected area once 30 g 3  . LORazepam (ATIVAN) 0.5 MG tablet Take 1 tablet (0.5 mg total) every 6 (six) hours as needed by mouth (Nausea or vomiting). 30 tablet 0  . Magnesium Oxide 250 MG TABS Take 250 mg by mouth every morning.     . metFORMIN (GLUCOPHAGE) 1000 MG tablet TAKE 1/2 (ONE-HALF) TABLET BY MOUTH TWICE DAILY 90 tablet 0  . mupirocin ointment (BACTROBAN) 2 %   0  . neomycin (MYCIFRADIN) 500 MG tablet   0  . Omega-3 Fatty Acids (FISH OIL) 1200 MG CAPS Take 1,200 mg by mouth 2 (two) times daily.     . ondansetron (ZOFRAN) 8 MG tablet Take 1 tablet (8 mg total) 2 (two) times daily as needed by mouth for refractory nausea / vomiting. Start on day 3 after chemotherapy. 30 tablet 1  . quinapril (ACCUPRIL) 5 MG tablet Take 1 tablet (5 mg total) by mouth daily. 30 tablet 5  . rosuvastatin (CRESTOR) 20 MG tablet Take 0.5 tablets (10 mg total) by mouth daily. (Patient taking differently: Take 10 mg by mouth at bedtime. TAKES 0.5 TABLET) 45 tablet 1  . saw palmetto 160 MG capsule Take 160 mg 2 (two) times daily by mouth.    . tamsulosin (FLOMAX) 0.4 MG CAPS capsule Take 0.4 mg by mouth daily.     No current facility-administered medications for this visit.     Allergies:   Patient has no known allergies.    Social History:  The patient  reports that he quit smoking about 8 months ago. His smoking use included cigarettes. he has never used smokeless tobacco. He reports that he does not drink alcohol or use drugs.   Family History:  The patient's family history includes Alzheimer's disease in his mother; Cancer in his father and sister; Diabetes in his father; Heart disease in his father.    ROS:  Please see the history of present illness.   Otherwise, review of systems are positive for none.   All other systems are reviewed and negative.    PHYSICAL EXAM: VS:  BP (!) 122/58 (BP Location: Left Arm, Patient Position:  Sitting, Cuff Size: Normal)   Ht 5\' 11"  (1.803 m)   Wt 272 lb 4 oz (123.5 kg)   BMI 37.97 kg/m  , BMI Body mass index is 37.97 kg/m. GEN: Well nourished, well developed, in no acute distress  HEENT: normal  Neck: no JVD, carotid bruits, or masses Cardiac: RRR; no  rubs, or gallops,  trace edema . 2/6 crescendo decrescendo systolic murmur in the aortic area which is mid peaking  Respiratory:  clear to auscultation bilaterally, normal work of breathing GI: soft, nontender, nondistended, + BS MS: no deformity or atrophy  Skin: warm and dry, no rash Neuro:  Strength and sensation are intact Psych: euthymic mood, full affect   EKG:  EKG is ordered today. The ekg ordered today demonstrates normal  sinus rhythm with first-degree AV block.   Recent Labs: 04/28/2016: TSH 2.54 01/25/2017: ALT 34; BUN 28; Creatinine, Ser 0.96; Hemoglobin 11.5; Magnesium 1.9; Platelets 209; Potassium 4.0; Sodium 137    Lipid Panel    Component Value Date/Time   CHOL 94 08/28/2016 0835   CHOL 132 12/25/2014 0803   TRIG 104 08/28/2016 0835   HDL 38 (L) 08/28/2016 0835   HDL 42 12/25/2014 0803   CHOLHDL 2.5 08/28/2016 0835   VLDL 21 08/28/2016 0835   LDLCALC 35 08/28/2016 0835   LDLCALC 67 12/25/2014 0803      Wt Readings from Last 3 Encounters:  02/25/17 272 lb 4 oz (123.5 kg)  01/25/17 265 lb (120.2 kg)  01/20/17 267 lb 10.2 oz (121.4 kg)        No flowsheet data found.    ASSESSMENT AND PLAN:  1.  Aortic stenosis: This was mild to moderate on echocardiogram in October.  Continue to monitor with a follow-up echocardiogram recommended in 1 year.  2. Coronary atherosclerosis: This was noted on previous CT scan.  Continue rosuvastatin.  3. Hyperlipidemia: Continue treatment with rosuvastatin with a target LDL of less than 70.   Disposition:   FU with me in 1 year.  Signed,  Kathlyn Sacramento, MD  02/25/2017 10:44 AM    [place]

## 2017-02-26 ENCOUNTER — Other Ambulatory Visit: Payer: Self-pay | Admitting: Family Medicine

## 2017-02-26 NOTE — Telephone Encounter (Signed)
Patient just saw Dr. Fletcher Anon yesterday; on same ACE-I; reviewed BP; no comment made by cardiologist about decreasing it; will refill as requested

## 2017-03-01 ENCOUNTER — Encounter: Payer: Self-pay | Admitting: Family Medicine

## 2017-03-01 ENCOUNTER — Ambulatory Visit: Payer: Medicare Other | Admitting: Family Medicine

## 2017-03-01 DIAGNOSIS — M1000 Idiopathic gout, unspecified site: Secondary | ICD-10-CM

## 2017-03-01 DIAGNOSIS — C184 Malignant neoplasm of transverse colon: Secondary | ICD-10-CM | POA: Diagnosis not present

## 2017-03-01 DIAGNOSIS — I700 Atherosclerosis of aorta: Secondary | ICD-10-CM

## 2017-03-01 DIAGNOSIS — E1121 Type 2 diabetes mellitus with diabetic nephropathy: Secondary | ICD-10-CM

## 2017-03-01 DIAGNOSIS — E785 Hyperlipidemia, unspecified: Secondary | ICD-10-CM

## 2017-03-01 DIAGNOSIS — F418 Other specified anxiety disorders: Secondary | ICD-10-CM | POA: Diagnosis not present

## 2017-03-01 DIAGNOSIS — N182 Chronic kidney disease, stage 2 (mild): Secondary | ICD-10-CM

## 2017-03-01 DIAGNOSIS — C3412 Malignant neoplasm of upper lobe, left bronchus or lung: Secondary | ICD-10-CM | POA: Diagnosis not present

## 2017-03-01 DIAGNOSIS — D649 Anemia, unspecified: Secondary | ICD-10-CM

## 2017-03-01 MED ORDER — PANTOPRAZOLE SODIUM 20 MG PO TBEC: 20.0000 mg | DELAYED_RELEASE_TABLET | Freq: Every day | ORAL | 2 refills | Status: DC

## 2017-03-01 NOTE — Assessment & Plan Note (Signed)
Check CBC 

## 2017-03-01 NOTE — Assessment & Plan Note (Signed)
Check kidneys

## 2017-03-01 NOTE — Assessment & Plan Note (Signed)
Check A1c and glucose

## 2017-03-01 NOTE — Assessment & Plan Note (Signed)
Primary carcinoma of lung, not metatstatic from colon; discussed PET scan, reviewed report with him

## 2017-03-01 NOTE — Assessment & Plan Note (Signed)
Patient has stopped his statin on his own; will check labs today and see where he stands off of his chol medicine

## 2017-03-01 NOTE — Patient Instructions (Addendum)
You have every right to know the cost of your treatments, so please do contact your oncologist We'll get labs today Here are some resources to help you if you feel you are in a mental health crisis:  [place] - Call [phone]  for help - Website with more resources: GripTrip.com.pt  [facility] - Call 7171433666 for help. - Mobile Crisis Program available 24 hours a day, 365 days a year. - Available for anyone of any age in [place] counties.  [facility] - Address: [address], [place] - Telephone: [phone]  - Hours of Operation: Sunday - Saturday - 8:00 a.m. - 8:00 p.m. - Medicaid, Medicare (Government Issued Only), BCBS, and [place] [employer], [place], Psychiatrists on-site to provide medication management, [place], and Peer Support Care.  [facility] - Call [phone] for help. - Mobile Crisis Program available 24 hours a day, 365 days a year. - Available for anyone of any age in [place] With Depression Everyone experiences occasional disappointment, sadness, and loss in their lives. When you are feeling down, blue, or sad for at least 2 weeks in a row, it may mean that you have depression. Depression can affect your thoughts and feelings, relationships, daily activities, and physical health. It is caused by changes in the way your brain functions. If you receive a diagnosis of depression, your health care provider will tell you which type of depression you have and what treatment options are available to you. If you are living with depression, there are ways to help you recover from it and also ways to prevent it from coming back. How to cope with lifestyle changes Coping with stress Stress is your body's reaction to life changes  and events, both good and bad. Stressful situations may include:  Getting married.  The death of a spouse.  Losing a job.  Retiring.  Having a baby.  Stress can last just a few hours or it can be ongoing. Stress can play a major role in depression, so it is important to learn both how to cope with stress and how to think about it differently. Talk with your health care provider or a counselor if you would like to learn more about stress reduction. He or she may suggest some stress reduction techniques, such as:  Music therapy. This can include creating music or listening to music. Choose music that you enjoy and that inspires you.  Mindfulness-based meditation. This kind of meditation can be done while sitting or walking. It involves being aware of your normal breaths, rather than trying to control your breathing.  Centering prayer. This is a kind of meditation that involves focusing on a spiritual word or phrase. Choose a word, phrase, or sacred image that is meaningful to you and that brings you peace.  Deep breathing. To do this, expand your stomach and inhale slowly through your nose. Hold your breath for 3-5 seconds, then exhale slowly, allowing your stomach muscles to relax.  Muscle relaxation. This involves intentionally tensing muscles then relaxing them.  Choose a stress reduction technique that fits your lifestyle and personality. Stress reduction techniques take time and practice to develop. Set aside 5-15 minutes a day to do them. Therapists can offer training in these techniques. The training may be covered by some insurance plans. Other things you can do to manage stress  include:  Keeping a stress diary. This can help you learn what triggers your stress and ways to control your response.  Understanding what your limits are and saying no to requests or events that lead to a schedule that is too full.  Thinking about how you respond to certain situations. You may not be  able to control everything, but you can control how you react.  Adding humor to your life by watching funny films or TV shows.  Making time for activities that help you relax and not feeling guilty about spending your time this way.  Medicines Your health care provider may suggest certain medicines if he or she feels that they will help improve your condition. Avoid using alcohol and other substances that may prevent your medicines from working properly (may interact). It is also important to:  Talk with your pharmacist or health care provider about all the medicines that you take, their possible side effects, and what medicines are safe to take together.  Make it your goal to take part in all treatment decisions (shared decision-making). This includes giving input on the side effects of medicines. It is best if shared decision-making with your health care provider is part of your total treatment plan.  If your health care provider prescribes a medicine, you may not notice the full benefits of it for 4-8 weeks. Most people who are treated for depression need to be on medicine for at least 6-12 months after they feel better. If you are taking medicines as part of your treatment, do not stop taking medicines without first talking to your health care provider. You may need to have the medicine slowly decreased (tapered) over time to decrease the risk of harmful side effects. Relationships Your health care provider may suggest family therapy along with individual therapy and drug therapy. While there may not be family problems that are causing you to feel depressed, it is still important to make sure your family learns as much as they can about your mental health. Having your family's support can help make your treatment successful. How to recognize changes in your condition Everyone has a different response to treatment for depression. Recovery from major depression happens when you have not had signs  of major depression for two months. This may mean that you will start to:  Have more interest in doing activities.  Feel less hopeless than you did 2 months ago.  Have more energy.  Overeat less often, or have better or improving appetite.  Have better concentration.  Your health care provider will work with you to decide the next steps in your recovery. It is also important to recognize when your condition is getting worse. Watch for these signs:  Having fatigue or low energy.  Eating too much or too little.  Sleeping too much or too little.  Feeling restless, agitated, or hopeless.  Having trouble concentrating or making decisions.  Having unexplained physical complaints.  Feeling irritable, angry, or aggressive.  Get help as soon as you or your family members notice these symptoms coming back. How to get support and help from others How to talk with friends and family members about your condition Talking to friends and family members about your condition can provide you with one way to get support and guidance. Reach out to trusted friends or family members, explain your symptoms to them, and let them know that you are working with a health care provider to treat your depression. Financial resources Not all  insurance plans cover mental health care, so it is important to check with your insurance carrier. If paying for co-pays or counseling services is a problem, search for a local or county mental health care center. They may be able to offer public mental health care services at low or no cost when you are not able to see a private health care provider. If you are taking medicine for depression, you may be able to get the generic form, which may be less expensive. Some makers of prescription medicines also offer help to patients who cannot afford the medicines they need. Follow these instructions at home:  Get the right amount and quality of sleep.  Cut down on using  caffeine, tobacco, alcohol, and other potentially harmful substances.  Try to exercise, such as walking or lifting small weights.  Take over-the-counter and prescription medicines only as told by your health care provider.  Eat a healthy diet that includes plenty of vegetables, fruits, whole grains, low-fat dairy products, and lean protein. Do not eat a lot of foods that are high in solid fats, added sugars, or salt.  Keep all follow-up visits as told by your health care provider. This is important. Contact a health care provider if:  You stop taking your antidepressant medicines, and you have any of these symptoms: ? Nausea. ? Headache. ? Feeling lightheaded. ? Chills and body aches. ? Not being able to sleep (insomnia).  You or your friends and family think your depression is getting worse. Get help right away if:  You have thoughts of hurting yourself or others. If you ever feel like you may hurt yourself or others, or have thoughts about taking your own life, get help right away. You can go to your nearest emergency department or call:  Your local emergency services (911 in the [place]  A suicide crisis helpline, such as the [place] [facility] at 205-633-4821. This is open 24-hours a day.  Summary  If you are living with depression, there are ways to help you recover from it and also ways to prevent it from coming back.  Work with your health care team to create a management plan that includes counseling, stress management techniques, and healthy lifestyle habits. This information is not intended to replace advice given to you by your health care provider. Make sure you discuss any questions you have with your health care provider. Document Released: 02/03/2016 Document Revised: 02/03/2016 Document Reviewed: 02/03/2016 Elsevier Interactive Patient Education  [employer].

## 2017-03-01 NOTE — Assessment & Plan Note (Signed)
No recent flares; check labs

## 2017-03-01 NOTE — Assessment & Plan Note (Signed)
Supportive listening provided; talked with patient about his condition; reason for doctor wanting to treat with chemo, positive nodes and possibility of cancer cells spread elsewhere; he was under the impression that all of the colon cancer was "gone" on the PET scan, so he just didn't understand why he still needed to do chemo; he agrees after our discussion; I encouraged him to talk with his cancer doctors about the cost of his treatment so he can be informed and decrease his anxiety about what things might cost; I offered to let him speak with a palliative care doctor to talk further about his ideas and options for treatment, how aggressive to be, reminding him that we are a team and he should feel involved and active in his care, how much he wants to do or not do and why; he agrees to not go through with any self-harm, verbally contracts for safety; he declined medicine for mood (anti-depressant); will provide him with close f/u, and see him back in 3 weeks; I explained that I am right here with him; he agrees and we had a good visit, more than 40 minutes face-to-face

## 2017-03-01 NOTE — Progress Notes (Signed)
BP 138/62   Pulse 80   Temp 97.7 F (36.5 C) (Oral)   Resp 16   Wt 270 lb 3.2 oz (122.6 kg)   SpO2 95%   BMI 37.69 kg/m    Subjective:    Patient ID: Donald Pou., male    DOB: 10-13-1945, 71 y.o.   MRN: [id number]  HPI: Donald Mcdowell is a 71 y.o. male  Chief Complaint  Patient presents with  . Follow-up    HPI Patient is here for f/u of type 2 diabetes, high cholesterol, but is dealing with primary colon cancer and primary bronchogenic carcinoma  Since last visit on December 14, 2016, he has seen the pulmonologist (Dr. Mortimer Fries), had electromagnetic navigation bronchoscopy on December 16, 2016; he saw Dr. Mike Gip, his oncologist, then had a partial colectomy on October 15th with Dr. Dahlia Byes, discharged on October 19th; he had an episode of bleeding in the hospital when he strained a little bit at the end of urination; but he reports the bleeding stopped and he did not need any PRBCs, just pressure dressing; post-op follow-up note documents positive lymph nodes and patient is to do chemo; he has a port; oncology note dated Jan 14, 2017 documents stage IIIB colon cancer; SBRT treatment for the [facility] in the lung  In regards to his lung cancer, he reports no trouble breathing; he quit smoking in May 2018 He got down to 262 pounds, but they were worried about him losing weight so he's eating  No blood with BMs; moving most days, about every day, strains just a little Lots of belly sounds They gave him IV Protonix and he wants something to use for reflux; he'd like the Protonix once it was explained that comes in a pill  Gout; taking allopurinol for that  Type 2 diabetes; sugars are going up; on medicine He wants to bring his sugar down, and used to walk 2 miles a day; he walked 3 miles the other day ; he is active and walking, but wants to see about going back on the metformin  We talked about his mood; he doesn't sleep at night worrying about his health and his bills He does  not think he would do anything when I specifically asked him if he would hurt himself; he says he promised someone that he wouldn't and he is a man of his word; he does have access to guns but wouldn't use them for that purpose He does not want antidepressants  They did a PET scan; the lesion in the lung, primary bronchogenic neoplasm; no metastatic disease from the colon  He has high blood pressure; picked up his Rx (quinapril)  Depression screen [facility] 2/9 03/01/2017 01/20/2017 12/14/2016 10/13/2016 08/28/2016  Decreased Interest 2 0 0 0 0  Down, Depressed, Hopeless 3 0 0 0 0  PHQ - 2 Score 5 0 0 0 0  Altered sleeping 3 - - - -  Tired, decreased energy 3 - - - -  Change in appetite 0 - - - -  Feeling bad or failure about yourself  3 - - - -  Trouble concentrating 1 - - - -  Moving slowly or fidgety/restless 0 - - - -  Suicidal thoughts 3 - - - -  PHQ-9 Score 18 - - - -  Difficult doing work/chores Very difficult - - - -   Relevant past medical, surgical, family and social history reviewed Past Medical History:  Diagnosis Date  .  Abdominal aortic atherosclerosis ([place]) 09/15/2016   CT scan June 2018  . Allergy   . Cancer ([place])   . Coronary artery calcification seen on CAT scan 09/15/2016   Previous smoker; noted on CT scan June 2018  . Diabetes mellitus without complication ([place])   . GERD (gastroesophageal reflux disease)   . Gout   . Hyperlipidemia   . Hypertension   . Kidney function abnormal    stage 2  . Kidney stones   . Obesity 08/20/2015   Past Surgical History:  Procedure Laterality Date  . COLONOSCOPY WITH PROPOFOL N/A 11/05/2016   Procedure: COLONOSCOPY WITH PROPOFOL;  Surgeon: Donald Lame, MD;  Location: [place];  Service: Gastroenterology;  Laterality: N/A;  . ELECTROMAGNETIC NAVIGATION BROCHOSCOPY N/A 12/16/2016   Procedure: ELECTROMAGNETIC NAVIGATION BRONCHOSCOPY;  Surgeon: Donald Lipps, MD;  Location: [facility];  Service: Cardiopulmonary;  Laterality: N/A;  .  ESOPHAGOGASTRODUODENOSCOPY (EGD) WITH PROPOFOL N/A 11/05/2016   Procedure: ESOPHAGOGASTRODUODENOSCOPY (EGD) WITH PROPOFOL;  Surgeon: Donald Lame, MD;  Location: [place];  Service: Gastroenterology;  Laterality: N/A;  Diabetic - oral meds  . kidney stones    . PARTIAL COLECTOMY N/A 12/28/2016   Procedure: PARTIAL COLECTOMY RIGHT;  Surgeon: Donald Husbands, MD;  Location: [facility];  Service: General;  Laterality: N/A;  . POLYPECTOMY N/A 11/05/2016   Procedure: POLYPECTOMY;  Surgeon: Donald Lame, MD;  Location: [place];  Service: Gastroenterology;  Laterality: N/A;  . PORTACATH PLACEMENT Right 01/18/2017   Procedure: INSERTION PORT-A-CATH;  Surgeon: Donald Husbands, MD;  Location: [facility];  Service: General;  Laterality: Right;   Family History  Problem Relation Age of Onset  . Alzheimer's disease Mother   . Heart disease Father   . Diabetes Father   . Cancer Father        Prostate  . Cancer Sister        skin cancer   Social History   Tobacco Use  . Smoking status: Former Smoker    Types: Cigarettes    Last attempt to quit: 06/2016    Years since quitting: 0.7  . Smokeless tobacco: Never Used  Substance Use Topics  . Alcohol use: No    Alcohol/week: 0.0 oz  . Drug use: No    Interim medical history since last visit reviewed. Allergies and medications reviewed  Review of Systems Per HPI unless specifically indicated above     Objective:    BP 138/62   Pulse 80   Temp 97.7 F (36.5 C) (Oral)   Resp 16   Wt 270 lb 3.2 oz (122.6 kg)   SpO2 95%   BMI 37.69 kg/m   Wt Readings from Last 3 Encounters:  03/01/17 270 lb 3.2 oz (122.6 kg)  02/25/17 272 lb 4 oz (123.5 kg)  01/25/17 265 lb (120.2 kg)    Physical Exam  Constitutional: He appears well-developed and well-nourished. No distress.  Obese, weight overall stable  HENT:  Head: Normocephalic and atraumatic.  Eyes: EOM are normal. No scleral icterus.  Neck: No thyromegaly present.    Cardiovascular: Normal rate and regular rhythm.  Pulmonary/Chest: Effort normal and breath sounds normal.  Port site upper RIGHT side chest wall  Abdominal: Soft. Bowel sounds are normal. He exhibits no distension. There is no tenderness. There is no guarding.  Midline surgical incision site C/D/I; active bowel sounds, nontender  Musculoskeletal: He exhibits no edema.  Neurological: Coordination normal.  Skin: Skin is warm and dry. He is not diaphoretic.  Nailbeds are pale  Psychiatric: He has a normal mood and affect. His behavior is normal. Judgment and thought content normal.   Diabetic Foot Form - Detailed   Diabetic Foot Exam - detailed Diabetic Foot exam was performed with the following findings:  Yes 03/01/2017  9:16 AM  Visual Foot Exam completed.:  Yes  Pulse Foot Exam completed.:  Yes  Right Dorsalis Pedis:  Present Left Dorsalis Pedis:  Present  Sensory Foot Exam Completed.:  Yes Semmes-Weinstein Monofilament Test R Site 1-Great Toe:  Pos L Site 1-Great Toe:  Pos       Results for orders placed or performed during the hospital encounter of 01/26/17  Glucose, capillary  Result Value Ref Range   Glucose-Capillary 131 (H) 65 - 99 mg/dL      Assessment & Plan:   Problem List Items Addressed This Visit      Cardiovascular and Mediastinum   Abdominal aortic atherosclerosis ([place])    Patient has stopped his statin on his own; will check labs today and see where he stands off of his chol medicine        Respiratory   Malignant neoplasm of upper lobe of left lung ([place])    Primary carcinoma of lung, not metatstatic from colon; discussed PET scan, reviewed report with him        Digestive   Malignant neoplasm of transverse colon ([place])    Treated by oncologist and radiation oncologist        Endocrine   Controlled diabetes mellitus with diabetic nephropathy ([place])    Check A1c and glucose      Relevant Orders   Hemoglobin A1c     Genitourinary   Chronic  kidney disease (CKD), stage II (mild) (Chronic)    Check kidneys      Relevant Orders   COMPLETE METABOLIC PANEL WITH GFR     Other   Hyperlipidemia LDL goal <100 (Chronic)    Check cholesterol; truly fasting      Relevant Orders   Lipid panel   Gout (Chronic)    No recent flares; check labs      Relevant Orders   Uric acid   Anxiety about health    Supportive listening provided; talked with patient about his condition; reason for doctor wanting to treat with chemo, positive nodes and possibility of cancer cells spread elsewhere; he was under the impression that all of the colon cancer was "gone" on the PET scan, so he just didn't understand why he still needed to do chemo; he agrees after our discussion; I encouraged him to talk with his cancer doctors about the cost of his treatment so he can be informed and decrease his anxiety about what things might cost; I offered to let him speak with a palliative care doctor to talk further about his ideas and options for treatment, how aggressive to be, reminding him that we are a team and he should feel involved and active in his care, how much he wants to do or not do and why; he agrees to not go through with any self-harm, verbally contracts for safety; he declined medicine for mood (anti-depressant); will provide him with close f/u, and see him back in 3 weeks; I explained that I am right here with him; he agrees and we had a good visit, more than 40 minutes face-to-face      Anemia    Check CBC      Relevant Orders   CBC with Differential/Platelet  Follow up plan: Return in about 3 weeks (around 03/22/2017) for follow-up visit with Dr. Sanda Klein.  An after-visit summary was printed and given to the patient at [facility].  Please see the patient instructions which may contain other information and recommendations beyond what is mentioned above in the assessment and plan.  Meds ordered this encounter  Medications  . pantoprazole  (PROTONIX) 20 MG tablet    Sig: Take 1 tablet (20 mg total) by mouth daily.    Dispense:  30 tablet    Refill:  2    Orders Placed This Encounter  Procedures  . Lipid panel  . Hemoglobin A1c  . COMPLETE METABOLIC PANEL WITH GFR  . CBC with Differential/Platelet  . Uric acid   Face-to-face time with patient was more than 40 minutes, >50% time spent counseling and coordination of care

## 2017-03-01 NOTE — Assessment & Plan Note (Signed)
Treated by oncologist and radiation oncologist

## 2017-03-01 NOTE — Assessment & Plan Note (Signed)
Check cholesterol; truly fasting

## 2017-03-02 ENCOUNTER — Ambulatory Visit
Admission: RE | Admit: 2017-03-02 | Discharge: 2017-03-02 | Disposition: A | Discharge status: Home / Self Care | Payer: Medicare Other | Source: Ambulatory Visit | Attending: Radiation Oncology | Admitting: Radiation Oncology

## 2017-03-02 ENCOUNTER — Other Ambulatory Visit: Payer: Self-pay | Admitting: Family Medicine

## 2017-03-02 DIAGNOSIS — C3412 Malignant neoplasm of upper lobe, left bronchus or lung: Secondary | ICD-10-CM | POA: Diagnosis not present

## 2017-03-02 DIAGNOSIS — Z87891 Personal history of nicotine dependence: Secondary | ICD-10-CM | POA: Diagnosis not present

## 2017-03-02 LAB — COMPLETE METABOLIC PANEL WITH GFR
AG Ratio: 1.5 (calc) (ref 1.0–2.5)
ALT: 28 U/L (ref 9–46)
AST: 19 U/L (ref 10–35)
Albumin: 4.3 g/dL (ref 3.6–5.1)
Alkaline phosphatase (APISO): 62 U/L (ref 40–115)
BILIRUBIN TOTAL: 0.4 mg/dL (ref 0.2–1.2)
BUN: 20 mg/dL (ref 7–25)
CALCIUM: 10 mg/dL (ref 8.6–10.3)
CHLORIDE: 101 mmol/L (ref 98–110)
CO2: 31 mmol/L (ref 20–32)
Creat: 1.11 mg/dL (ref 0.70–1.18)
GFR, EST AFRICAN AMERICAN: 78 mL/min/{1.73_m2} (ref >=60)
GFR, Est Non African American: 67 mL/min/{1.73_m2} (ref >=60)
GLUCOSE: 175 mg/dL — AB (ref 65–99)
Globulin: 2.8 g/dL (calc) (ref 1.9–3.7)
Potassium: 4.6 mmol/L (ref 3.5–5.3)
Sodium: 139 mmol/L (ref 135–146)
TOTAL PROTEIN: 7.1 g/dL (ref 6.1–8.1)

## 2017-03-02 LAB — HEMOGLOBIN A1C
Hgb A1c MFr Bld: 7 % of total Hgb — ABNORMAL HIGH (ref <5.7)
Mean Plasma Glucose: 154 (calc)
eAG (mmol/L): 8.5 (calc)

## 2017-03-02 LAB — LIPID PANEL
CHOLESTEROL: 193 mg/dL (ref <200)
HDL: 35 mg/dL — AB (ref >40)
LDL Cholesterol (Calc): 124 mg/dL (calc) — ABNORMAL HIGH
NON-HDL CHOLESTEROL (CALC): 158 mg/dL — AB (ref <130)
Total CHOL/HDL Ratio: 5.5 (calc) — ABNORMAL HIGH (ref <5.0)
Triglycerides: 216 mg/dL — ABNORMAL HIGH (ref <150)

## 2017-03-02 LAB — CBC WITH DIFFERENTIAL/PLATELET
BASOS ABS: 41 {cells}/uL (ref 0–200)
BASOS PCT: 0.7 %
EOS PCT: 1.9 %
Eosinophils Absolute: 112 cells/uL (ref 15–500)
HCT: 43.1 % (ref 38.5–50.0)
HEMOGLOBIN: 14.2 g/dL (ref 13.2–17.1)
Lymphs Abs: 1593 cells/uL (ref 850–3900)
MCH: 29.6 pg (ref 27.0–33.0)
MCHC: 32.9 g/dL (ref 32.0–36.0)
MCV: 89.8 fL (ref 80.0–100.0)
MONOS PCT: 7 %
MPV: 9.2 fL (ref 7.5–12.5)
Neutro Abs: 3741 cells/uL (ref 1500–7800)
Neutrophils Relative %: 63.4 %
Platelets: 209 10*3/uL (ref 140–400)
RBC: 4.8 10*6/uL (ref 4.20–5.80)
RDW: 14 % (ref 11.0–15.0)
Total Lymphocyte: 27 %
WBC mixed population: 413 cells/uL (ref 200–950)
WBC: 5.9 10*3/uL (ref 3.8–10.8)

## 2017-03-02 LAB — URIC ACID: Uric Acid, Serum: 5.3 mg/dL (ref 4.0–8.0)

## 2017-03-02 MED ORDER — METFORMIN HCL 1000 MG PO TABS: 1000.0000 mg | ORAL_TABLET | Freq: Two times a day (BID) | ORAL | 1 refills | Status: DC

## 2017-03-02 MED ORDER — ROSUVASTATIN CALCIUM 10 MG PO TABS: 10.0000 mg | ORAL_TABLET | Freq: Every day | ORAL | 1 refills | Status: DC

## 2017-03-02 NOTE — Progress Notes (Signed)
Start back on crestor Increase metformin Note to patient

## 2017-03-04 ENCOUNTER — Telehealth: Payer: Self-pay

## 2017-03-04 NOTE — Telephone Encounter (Signed)
I spoke with patient Donald Mcdowell asked about the iron His H/H have really come up on TID iron Donald Mcdowell wonders if Donald Mcdowell still has to take that much I'll send a note to Dr. Mike Gip and let her see the recent CBC and see if she wants to reduce iron (once a day?) or just stop Patient agrees with plan

## 2017-03-04 NOTE — Telephone Encounter (Signed)
Pt notified, updated med list   Copied from [place] [id number]. Topic: Inquiry >> Mar 03, 2017  4:37 PM Conception Chancy, NT wrote: Reason for CRM: patient would like Dr. Sanda Klein to give him a call in regards to his medications. He stated yesterday from his appt he was told he is no long anemic and he wants to know if he needs to continue taking medication for it.

## 2017-03-05 NOTE — Telephone Encounter (Signed)
  He has greatly improved on oral iron TID.  Hematocrit is now normal.  In 01/2017 his ferritin was 15.  Ferritin goal is 100.  He can cut his oral iron down.  We would stop the iron once he reaches goal.  Lenna Sciara

## 2017-03-05 NOTE — Telephone Encounter (Signed)
Please let patient know that we'll have him decrease his iron to just ONCE a day. Please update the med list. Thank you

## 2017-03-14 NOTE — Progress Notes (Signed)
[name] day:  03/15/2017  Chief Complaint: Donald Mcdowell. is a 71 y.o. male with stage IIIB colon cancer who is seen for assessment prior to cycle #1 FOLFOX chemotherapy.  HPI:   The patient was last seen in the medical oncology clinic on 01/25/2017.  At that time, patient was doing "ok" following his recent colectomy. Diarrhea had improved. He had no acute concerns. He continued his oral iron BID as directed. Exam was stable, revealing a mid-abdominal incision with no evidence of infection. He was scheduled for radiation treatments. WBC was 5300 with an [place] of 3000. Hemoglobin was 11.5, hematocrit 35.4, and platelets 209,000.   PET scan on 01/26/2017 revealed a 2.4 x 0.9 cm hypermetabolic medial left upper lobe pulmonary nodule, corresponding to known primary bronchogenic neoplasm. No findings suspicious for metastatic disease.  Patient was seen in consult by Dr. Fletcher Anon (cardiology) on 02/25/2017. He had an echocardiogram in October that showed normal LV systolic function with mild to moderate aortic stenosis with a mean gradient of 13 mmHg with mild LVOT obstruction. Plans are for patient to follow up in 1 year with provider assessment and repeat echocardiogram.   Patient received SBRT to the left upper lobe of 5000 cGy in 5 fractions (02/16/2017 - 03/02/2017).    Repeat labs were done through patient's PCP on 03/01/2017 revealed a WBC of 5900 with an [place] of 3741.  Blood counts have improved over all. Hemoglobin 14.2 (previously 11.5), hematocrit 43.1 (previously 35.4), and platelets 209,000. Oral iron was reduced to once daily.  In the interim, patient has been doing well. He denies any acute concerns. Patient has continued to do well following his colectomy. His bowel habits are stable. Patient denies any B symptoms or interval infections.    Past Medical History:  Diagnosis Date  . Abdominal aortic atherosclerosis ([place]) 09/15/2016   CT  scan June 2018  . Allergy   . Cancer ([place])   . Coronary artery calcification seen on CAT scan 09/15/2016   Previous smoker; noted on CT scan June 2018  . Diabetes mellitus without complication ([place])   . GERD (gastroesophageal reflux disease)   . Gout   . Hyperlipidemia   . Hypertension   . Kidney function abnormal    stage 2  . Kidney stones   . Obesity 08/20/2015    Past Surgical History:  Procedure Laterality Date  . COLONOSCOPY WITH PROPOFOL N/A 11/05/2016   Procedure: COLONOSCOPY WITH PROPOFOL;  Surgeon: Lucilla Lame, MD;  Location: [place];  Service: Gastroenterology;  Laterality: N/A;  . ELECTROMAGNETIC NAVIGATION BROCHOSCOPY N/A 12/16/2016   Procedure: ELECTROMAGNETIC NAVIGATION BRONCHOSCOPY;  Surgeon: Flora Lipps, MD;  Location: [facility];  Service: Cardiopulmonary;  Laterality: N/A;  . ESOPHAGOGASTRODUODENOSCOPY (EGD) WITH PROPOFOL N/A 11/05/2016   Procedure: ESOPHAGOGASTRODUODENOSCOPY (EGD) WITH PROPOFOL;  Surgeon: Lucilla Lame, MD;  Location: [facility];  Service: Gastroenterology;  Laterality: N/A;  Diabetic - oral meds  . kidney stones    . PARTIAL COLECTOMY N/A 12/28/2016   Procedure: PARTIAL COLECTOMY RIGHT;  Surgeon: Jules Husbands, MD;  Location: [facility];  Service: General;  Laterality: N/A;  . POLYPECTOMY N/A 11/05/2016   Procedure: POLYPECTOMY;  Surgeon: Lucilla Lame, MD;  Location: [place];  Service: Gastroenterology;  Laterality: N/A;  . PORTACATH PLACEMENT Right 01/18/2017   Procedure: INSERTION PORT-A-CATH;  Surgeon: Jules Husbands, MD;  Location: [facility];  Service: General;  Laterality: Right;    Family History  Problem Relation Age of Onset  . Alzheimer's disease Mother   . Heart disease Father   . Diabetes Father   . Cancer Father        Prostate  . Cancer Sister        skin cancer    Social History:  reports that he quit smoking about 9 months ago. His smoking use included cigarettes. he has never used smokeless tobacco.  He reports that he does not drink alcohol or use drugs.  Patient worked for a [employer].  He retired in 1996.  He was in his 60s. Patient was a 1-2 pack per day smoker since he was a child.  He stopped smoking in 07/2016.  He drank alcohol in the past.  He has 3 living sisters (1 sister died).  He has no children.  The patient is accompanied by his wife [name]) today.  Communicate with neighbor [name]  [phone].  Allergies: No Known Allergies  Current Medications: Current Outpatient Medications  Medication Sig Dispense Refill  . allopurinol (ZYLOPRIM) 300 MG tablet Take 1 tablet (300 mg total) by mouth daily. 90 tablet 3  . Ascorbic Acid (VITAMIN C PO) Take 1 tablet by mouth daily.    . Cyanocobalamin (VITAMIN B-12 PO) Take 1 tablet by mouth daily.    . ferrous sulfate 324 (65 Fe) MG TBEC Take 1 tablet by mouth daily.     Marland Kitchen glipiZIDE (GLUCOTROL XL) 10 MG 24 hr tablet Take 1 tablet (10 mg total) by mouth 2 (two) times daily. 180 tablet 1  . glucose blood (ACCU-CHEK COMPACT STRIPS) test strip 100 each by Other route as needed for other. Use as instructed, check FSBS once a day; LON 99 months, E11.9 100 each 1  . Magnesium Oxide 250 MG TABS Take 250 mg by mouth every morning.     . metFORMIN (GLUCOPHAGE) 1000 MG tablet Take 1 tablet (1,000 mg total) by mouth 2 (two) times daily with a meal. 180 tablet 1  . mupirocin ointment (BACTROBAN) 2 %   0  . neomycin (MYCIFRADIN) 500 MG tablet   0  . pantoprazole (PROTONIX) 20 MG tablet Take 1 tablet (20 mg total) by mouth daily. 30 tablet 2  . quinapril (ACCUPRIL) 5 MG tablet TAKE 1 TABLET BY MOUTH ONCE DAILY 30 tablet 5  . rosuvastatin (CRESTOR) 10 MG tablet Take 1 tablet (10 mg total) by mouth at bedtime. 90 tablet 1  . saw palmetto 160 MG capsule Take 160 mg 2 (two) times daily by mouth.    . tamsulosin (FLOMAX) 0.4 MG CAPS capsule Take 0.4 mg by mouth daily.    Marland Kitchen lidocaine-prilocaine (EMLA) cream Apply to affected area once (Patient  not taking: Reported on 03/01/2017) 30 g 3   No current facility-administered medications for this visit.    Facility-Administered Medications Ordered in Other Visits  Medication Dose Route Frequency Provider Last Rate Last Dose  . heparin lock flush 100 unit/mL  500 Units Intravenous Once Corcoran, Melissa C, MD      . sodium chloride flush (NS) 0.9 % injection 10 mL  10 mL Intravenous PRN Lequita Asal, MD   10 mL at 03/15/17 0850    Review of Systems:  GENERAL:  Feels "well".  No fevers or sweats.  Intentional weight loss (272 pounds in 04/2016). Weight stable.  PERFORMANCE STATUS (ECOG):  1 HEENT:  No visual changes, runny nose, sore throat, mouth sores or tenderness. Lungs: No shortness of breath or  cough.  No hemoptysis. Cardiac:  Murmur. No chest pain, palpitations, orthopnea, or PND. EF 55-60%. GI:  No nausea, vomiting, constipation, or melena. GU:  No urgency, frequency, dysuria, or hematuria. Musculoskeletal:  No back pain.  No joint pain.  No muscle tenderness. Extremities:  No pain or swelling. Skin:  No rashes or skin changes. Neuro:  No headache, numbness or weakness, balance or coordination issues. Endocrine:  Diabetes.  No thyroid issues, hot flashes or night sweats. Psych:  No mood changes, depression or anxiety. Pain:  No focal pain. Review of systems:  All other systems reviewed and found to be negative.  Physical Exam: Blood pressure 127/63, pulse 73, temperature 98.1 F (36.7 C), temperature source Tympanic, resp. rate 16, weight 271 lb 3.2 oz (123 kg). GENERAL:  Well developed, well nourished, heavyset gentleman sitting comfortably in the exam room in no acute distress. MENTAL STATUS:  Alert and oriented to person, place and time. HEAD:  Waring a cap.  Thin gray hair.  Male pattern baldness.  Normocephalic, atraumatic, face symmetric, no Cushingoid features. EYES:  Glasses.  Blue eyes.  Pupils equal round and reactive to light and accomodation.  No  conjunctivitis or scleral icterus. ENT:  Oropharynx clear without lesion.  Tongue normal. Mucous membranes moist.  RESPIRATORY:  Clear to auscultation without rales, wheezes or rhonchi. CARDIOVASCULAR:  Regular rate and rhythm without murmur, rub or gallop. CHEST WALL:  Port-a-cath in place.  ABDOMEN:  Midline abdominal incision.  Fully round.  Soft, non-tender, with active bowel sounds, and no appreciable hepatosplenomegaly.  No masses. SKIN:  No rashes, ulcers or lesions. EXTREMITIES: No edema, no skin discoloration or tenderness.  No palpable cords. LYMPH NODES: No palpable cervical, supraclavicular, axillary or inguinal adenopathy  NEUROLOGICAL: Unremarkable. PSYCH:  Appropriate.   Infusion on 03/15/2017  Component Date Value Ref Range Status  . Magnesium 03/15/2017 1.7  1.7 - 2.4 mg/dL Final   Performed at [facility], [address] [place], [place] [address]  . Sodium 03/15/2017 138  135 - 145 mmol/L Final  . Potassium 03/15/2017 4.2  3.5 - 5.1 mmol/L Final  . Chloride 03/15/2017 106  101 - 111 mmol/L Final  . CO2 03/15/2017 24  22 - 32 mmol/L Final  . Glucose, Bld 03/15/2017 236* 65 - 99 mg/dL Final  . BUN 03/15/2017 20  6 - 20 mg/dL Final  . Creatinine, Ser 03/15/2017 0.95  0.61 - 1.24 mg/dL Final  . Calcium 03/15/2017 9.2  8.9 - 10.3 mg/dL Final  . Total Protein 03/15/2017 7.1  6.5 - 8.1 g/dL Final  . Albumin 03/15/2017 3.8  3.5 - 5.0 g/dL Final  . AST 03/15/2017 24  15 - 41 U/L Final  . ALT 03/15/2017 32  17 - 63 U/L Final  . Alkaline Phosphatase 03/15/2017 51  38 - 126 U/L Final  . Total Bilirubin 03/15/2017 0.4  0.3 - 1.2 mg/dL Final  . GFR calc non Af Amer 03/15/2017 >60  >60 mL/min Final  . GFR calc Af Amer 03/15/2017 >60  >60 mL/min Final   Comment: (NOTE) The eGFR has been calculated using the CKD EPI equation. This calculation has not been validated in all clinical situations. eGFR's persistently <60 mL/min signify possible Chronic Kidney Disease.   Georgiann Hahn gap 03/15/2017 8  5 - 15 Final   Performed at [facility], [address] [place], [place] [address]  . WBC 03/15/2017 5.3  3.8 - 10.6 K/uL Final  . RBC 03/15/2017 4.42  4.40 - 5.90 MIL/uL Final  . Hemoglobin 03/15/2017 13.1  13.0 - 18.0 g/dL Final  . HCT 03/15/2017 39.9* 40.0 - 52.0 % Final  . MCV 03/15/2017 90.3  80.0 - 100.0 fL Final  . MCH 03/15/2017 29.6  26.0 - 34.0 pg Final  . MCHC 03/15/2017 32.8  32.0 - 36.0 g/dL Final  . RDW 03/15/2017 15.7* 11.5 - 14.5 % Final  . Platelets 03/15/2017 156  150 - 440 K/uL Final  . Neutrophils Relative % 03/15/2017 71  % Final  . Neutro Abs 03/15/2017 3.8  1.4 - 6.5 K/uL Final  . Lymphocytes Relative 03/15/2017 21  % Final  . Lymphs Abs 03/15/2017 1.1  1.0 - 3.6 K/uL Final  . Monocytes Relative 03/15/2017 6  % Final  . Monocytes Absolute 03/15/2017 0.3  0.2 - 1.0 K/uL Final  . Eosinophils Relative 03/15/2017 1  % Final  . Eosinophils Absolute 03/15/2017 0.1  0 - 0.7 K/uL Final  . Basophils Relative 03/15/2017 1  % Final  . Basophils Absolute 03/15/2017 0.0  0 - 0.1 K/uL Final   Performed at [facility], [address] [place], [place] [address]    Assessment:  Donald Mcdowell. is a 71 y.o. male with stage IIIB transverse colon cancer s/p right partial colectomy on 12/28/2016.  Pathology revealed a 5.5 cm grade II invasive adenocarcinoma of the transverse colon, extending through the muscularis propria and into the adjacent adipose tissue, including the greater omentum. There was metastatic adenocarcinoma in multiple lymph nodes and tumor deposits, at least 5 involved with a total of at least 18 lymph nodes (5 of 18).  Pathologic stage was T3N2a.  CEA was 1.4 on 11/09/2016.  Abdomen and pelvic CT on 11/09/2016 revealed mid transverse colon segment of luminal narrowing with wall thickening compatible with colon cancer.  There was a single enlarged adjacent mesenteric lymph node and prominent subcentimeter lymph nodes  anterior to the pancreas which may be metastatic.  There was no evidence of distant metastasis.  There was a left kidney lower pole complex cyst with thin internal septations (Bosniak IIF).  His prostate was enlarged and extended into the floor of bladder.  There was right kidney lower pole punctate nonobstructing nephrolithiasis.  Chest CT on 11/18/2016 revealed no metastatic disease.  There was abrupt termination of the left upper lobe beyond which the distal bronchus appeared dilated with mild branching into the lung parenchyma.  Imaging suggested a bronchocele in the medial aspect of the left upper lobe. Differential included an endobronchial polyp, endobronchial tumor, bronchial stricture or aspirated foreign body.   Electromagnetic navigational bronchoscopy (ENB) on 12/16/2016 revealed non-small cell carcinoma, favor squamous cell lung carcinoma.  Tumor was positive for p40 and negative for cdx-2 and TTF-1.  He received SBRT to the left upper lobe of 5000 cGy in 5 fractions from 02/16/2017 - 03/02/2017.  PET scan on 01/26/2017 revealed a 2.4 x 0.9 cm hypermetabolic medial left upper lobe pulmonary nodule, corresponding to known primary bronchogenic neoplasm. No findings suspicious for metastatic disease.  EGD on 11/05/2016 revealed gastritis.  Pathology confirmed chronic mild gastritis and features of a healing erosion in the stomach.  There was no H pylori, dysplasia or malignancy.  Echocardiogram  On 12/15/2016 revealed mild to moderate aortic valve stenosis. There was abnormal LV relaxation consistent with grade I diastolic dysfunction. Estimated EF was 55-60%.  He has iron deficiency anemia.  He is on oral iron.  Ferritin was 6 on 09/04/2016.  Hematocrit  was 29.7 and hemoglobin 8.8 on 09/04/2016.  Hematocrit was 28.1, hemoglobin 9.3, and MCV 87.9 on 11/09/2016.   Symptomatically, he is doing well. He denies pain.  Exam is normal.  WBC is 5300 with an [place] of 3800. Hemoglobin is 13.1,  hematocrit 39.9, and platelets 156,000.  Plan: 1.  Labs today: CBC with diff, CMP, Mg 2.  Review chemotherapy regimen and schedule. Side effects of medications discussed at length and questions fielded. Initial cycle will be dose adjusted. Dicussed that if patient tolerates this dose, we will plan to increase to full dose for subsequent cycles. Patient will be seen every 2 weeks for 12 cycles of FOLFOX chemotherapy.  Labs will be drawn at each visit prior to chemotherapy. Patient verbalizes understanding and provides consents to proceed with initial cycle of chemotherapy today.  3.  Discuss after treatment plans. Once chemotherapy treatments are completed completed, patient will be seen in clinic every 3 months for the first year, every 4 months for the second year, and every 6 months for the third through the fifth year. Scans will be performed at the end of treatment and then annually x 5 years.  4.  Cycle #1 FOLFOX today. 5.  RTC on 03/17/2017 for pump disconnect.  6.  RTC in 1 week for provider assessment and labs (CBC with diff, BMP, Mg).  7.  RTC on 03/29/2017 for MD assessment, labs (CBC with diff, CMP, Mg), and cycle #2 FOLFOX. 8.  RTC on 03/31/2017 for pump disconnect.    [name], NP  03/15/2017, 9:46 AM   I saw and evaluated the patient, participating in the key portions of the service and reviewing pertinent diagnostic studies and records. I reviewed the nurse practitioner's note and agree with the findings and the plan.  The assessment and plan were discussed with the patient.  Multiple questions were asked by the patient and answered.   Lequita Asal, MD 03/15/2017 ,9:46 AM

## 2017-03-15 ENCOUNTER — Inpatient Hospital Stay: Payer: Medicare Other | Attending: Hematology and Oncology

## 2017-03-15 ENCOUNTER — Inpatient Hospital Stay: Payer: Medicare Other

## 2017-03-15 ENCOUNTER — Other Ambulatory Visit: Payer: Self-pay | Admitting: Hematology and Oncology

## 2017-03-15 ENCOUNTER — Inpatient Hospital Stay (HOSPITAL_BASED_OUTPATIENT_CLINIC_OR_DEPARTMENT_OTHER): Payer: Medicare Other | Admitting: Hematology and Oncology

## 2017-03-15 VITALS — BP 127/63 | HR 73 | Temp 98.1°F | Resp 16 | Wt 271.2 lb

## 2017-03-15 DIAGNOSIS — K219 Gastro-esophageal reflux disease without esophagitis: Secondary | ICD-10-CM | POA: Diagnosis not present

## 2017-03-15 DIAGNOSIS — Z7189 Other specified counseling: Secondary | ICD-10-CM

## 2017-03-15 DIAGNOSIS — R197 Diarrhea, unspecified: Secondary | ICD-10-CM

## 2017-03-15 DIAGNOSIS — F17210 Nicotine dependence, cigarettes, uncomplicated: Secondary | ICD-10-CM

## 2017-03-15 DIAGNOSIS — C184 Malignant neoplasm of transverse colon: Secondary | ICD-10-CM

## 2017-03-15 DIAGNOSIS — Z5111 Encounter for antineoplastic chemotherapy: Secondary | ICD-10-CM | POA: Diagnosis present

## 2017-03-15 DIAGNOSIS — C3412 Malignant neoplasm of upper lobe, left bronchus or lung: Secondary | ICD-10-CM | POA: Diagnosis not present

## 2017-03-15 DIAGNOSIS — Z9049 Acquired absence of other specified parts of digestive tract: Secondary | ICD-10-CM | POA: Diagnosis not present

## 2017-03-15 DIAGNOSIS — C189 Malignant neoplasm of colon, unspecified: Secondary | ICD-10-CM

## 2017-03-15 DIAGNOSIS — Z79899 Other long term (current) drug therapy: Secondary | ICD-10-CM

## 2017-03-15 DIAGNOSIS — K2970 Gastritis, unspecified, without bleeding: Secondary | ICD-10-CM | POA: Diagnosis not present

## 2017-03-15 DIAGNOSIS — E119 Type 2 diabetes mellitus without complications: Secondary | ICD-10-CM | POA: Diagnosis not present

## 2017-03-15 DIAGNOSIS — C778 Secondary and unspecified malignant neoplasm of lymph nodes of multiple regions: Secondary | ICD-10-CM

## 2017-03-15 DIAGNOSIS — I10 Essential (primary) hypertension: Secondary | ICD-10-CM | POA: Diagnosis not present

## 2017-03-15 DIAGNOSIS — Z7984 Long term (current) use of oral hypoglycemic drugs: Secondary | ICD-10-CM | POA: Diagnosis not present

## 2017-03-15 DIAGNOSIS — I2510 Atherosclerotic heart disease of native coronary artery without angina pectoris: Secondary | ICD-10-CM | POA: Diagnosis not present

## 2017-03-15 DIAGNOSIS — E669 Obesity, unspecified: Secondary | ICD-10-CM | POA: Diagnosis not present

## 2017-03-15 DIAGNOSIS — Z87442 Personal history of urinary calculi: Secondary | ICD-10-CM | POA: Diagnosis not present

## 2017-03-15 DIAGNOSIS — N400 Enlarged prostate without lower urinary tract symptoms: Secondary | ICD-10-CM

## 2017-03-15 DIAGNOSIS — E785 Hyperlipidemia, unspecified: Secondary | ICD-10-CM | POA: Diagnosis not present

## 2017-03-15 DIAGNOSIS — D509 Iron deficiency anemia, unspecified: Secondary | ICD-10-CM

## 2017-03-15 DIAGNOSIS — M109 Gout, unspecified: Secondary | ICD-10-CM | POA: Diagnosis not present

## 2017-03-15 LAB — CBC WITH DIFFERENTIAL/PLATELET
Basophils Absolute: 0 10*3/uL (ref 0–0.1)
Basophils Relative: 1 %
Eosinophils Absolute: 0.1 10*3/uL (ref 0–0.7)
Eosinophils Relative: 1 %
HCT: 39.9 % — ABNORMAL LOW (ref 40.0–52.0)
Hemoglobin: 13.1 g/dL (ref 13.0–18.0)
Lymphocytes Relative: 21 %
Lymphs Abs: 1.1 10*3/uL (ref 1.0–3.6)
MCH: 29.6 pg (ref 26.0–34.0)
MCHC: 32.8 g/dL (ref 32.0–36.0)
MCV: 90.3 fL (ref 80.0–100.0)
Monocytes Absolute: 0.3 10*3/uL (ref 0.2–1.0)
Monocytes Relative: 6 %
Neutro Abs: 3.8 10*3/uL (ref 1.4–6.5)
Neutrophils Relative %: 71 %
Platelets: 156 10*3/uL (ref 150–440)
RBC: 4.42 MIL/uL (ref 4.40–5.90)
RDW: 15.7 % — ABNORMAL HIGH (ref 11.5–14.5)
WBC: 5.3 10*3/uL (ref 3.8–10.6)

## 2017-03-15 LAB — COMPREHENSIVE METABOLIC PANEL
ALT: 32 U/L (ref 17–63)
AST: 24 U/L (ref 15–41)
Albumin: 3.8 g/dL (ref 3.5–5.0)
Alkaline Phosphatase: 51 U/L (ref 38–126)
Anion gap: 8 (ref 5–15)
BUN: 20 mg/dL (ref 6–20)
CO2: 24 mmol/L (ref 22–32)
Calcium: 9.2 mg/dL (ref 8.9–10.3)
Chloride: 106 mmol/L (ref 101–111)
Creatinine, Ser: 0.95 mg/dL (ref 0.61–1.24)
GFR calc Af Amer: >60 mL/min (ref >60)
GFR calc non Af Amer: >60 mL/min (ref >60)
Glucose, Bld: 236 mg/dL — ABNORMAL HIGH (ref 65–99)
Potassium: 4.2 mmol/L (ref 3.5–5.1)
Sodium: 138 mmol/L (ref 135–145)
Total Bilirubin: 0.4 mg/dL (ref 0.3–1.2)
Total Protein: 7.1 g/dL (ref 6.5–8.1)

## 2017-03-15 LAB — MAGNESIUM: Magnesium: 1.7 mg/dL (ref 1.7–2.4)

## 2017-03-15 MED ORDER — DEXTROSE 5 % IV SOLN
Freq: Once | INTRAVENOUS | Status: AC
  Administered 2017-03-15: 11:00:00 via INTRAVENOUS
  Filled 2017-03-15: qty 1000

## 2017-03-15 MED ORDER — OXALIPLATIN CHEMO INJECTION 100 MG/20ML
85.0000 mg/m2 | Freq: Once | INTRAVENOUS | Status: AC
  Administered 2017-03-15: 210 mg via INTRAVENOUS
  Filled 2017-03-15: qty 40

## 2017-03-15 MED ORDER — SODIUM CHLORIDE 0.9 % IV SOLN: 10.0000 mg | Freq: Once | INTRAVENOUS | Status: DC

## 2017-03-15 MED ORDER — FLUOROURACIL CHEMO INJECTION 2.5 GM/50ML: 400.0000 mg/m2 | Freq: Once | INTRAVENOUS | Status: DC

## 2017-03-15 MED ORDER — SODIUM CHLORIDE 0.9% FLUSH
10.0000 mL | INTRAVENOUS | Status: DC | PRN
  Administered 2017-03-15: 10 mL via INTRAVENOUS
  Filled 2017-03-15: qty 10

## 2017-03-15 MED ORDER — SODIUM CHLORIDE 0.9 % IV SOLN: 2400.0000 mg/m2 | INTRAVENOUS | Status: DC

## 2017-03-15 MED ORDER — FLUOROURACIL CHEMO INJECTION 2.5 GM/50ML
400.0000 mg/m2 | Freq: Once | INTRAVENOUS | Status: AC
  Administered 2017-03-15: 1000 mg via INTRAVENOUS
  Filled 2017-03-15: qty 20

## 2017-03-15 MED ORDER — DEXAMETHASONE SODIUM PHOSPHATE 10 MG/ML IJ SOLN
10.0000 mg | Freq: Once | INTRAMUSCULAR | Status: AC
  Administered 2017-03-15: 10 mg via INTRAVENOUS
  Filled 2017-03-15: qty 1

## 2017-03-15 MED ORDER — HEPARIN SOD (PORK) LOCK FLUSH 100 UNIT/ML IV SOLN: 500.0000 [IU] | Freq: Once | INTRAVENOUS | Status: DC

## 2017-03-15 MED ORDER — SODIUM CHLORIDE 0.9 % IV SOLN
2400.0000 mg/m2 | INTRAVENOUS | Status: DC
  Administered 2017-03-15: 5950 mg via INTRAVENOUS
  Filled 2017-03-15: qty 119

## 2017-03-15 MED ORDER — LEUCOVORIN CALCIUM INJECTION 350 MG
1000.0000 mg | Freq: Once | INTRAMUSCULAR | Status: AC
  Administered 2017-03-15: 1000 mg via INTRAVENOUS
  Filled 2017-03-15: qty 50

## 2017-03-15 MED ORDER — DEXTROSE 5 % IV SOLN: 85.0000 mg/m2 | Freq: Once | INTRAVENOUS | Status: DC

## 2017-03-15 MED ORDER — PALONOSETRON HCL INJECTION 0.25 MG/5ML
0.2500 mg | Freq: Once | INTRAVENOUS | Status: AC
  Administered 2017-03-15: 0.25 mg via INTRAVENOUS
  Filled 2017-03-15: qty 5

## 2017-03-15 MED ORDER — LEUCOVORIN CALCIUM INJECTION 350 MG: 400.0000 mg/m2 | Freq: Once | INTRAVENOUS | Status: DC

## 2017-03-15 NOTE — Progress Notes (Signed)
Patient is here today for a follow up. Patient states no new concerns today.  

## 2017-03-16 ENCOUNTER — Encounter: Payer: Self-pay | Admitting: Hematology and Oncology

## 2017-03-17 ENCOUNTER — Inpatient Hospital Stay: Payer: Medicare Other | Attending: Hematology and Oncology

## 2017-03-17 VITALS — BP 136/65 | HR 75 | Temp 97.3°F | Resp 18

## 2017-03-17 DIAGNOSIS — D509 Iron deficiency anemia, unspecified: Secondary | ICD-10-CM | POA: Diagnosis not present

## 2017-03-17 DIAGNOSIS — Z5111 Encounter for antineoplastic chemotherapy: Secondary | ICD-10-CM | POA: Diagnosis present

## 2017-03-17 DIAGNOSIS — Z452 Encounter for adjustment and management of vascular access device: Secondary | ICD-10-CM | POA: Diagnosis not present

## 2017-03-17 DIAGNOSIS — C184 Malignant neoplasm of transverse colon: Secondary | ICD-10-CM

## 2017-03-17 DIAGNOSIS — C3412 Malignant neoplasm of upper lobe, left bronchus or lung: Secondary | ICD-10-CM | POA: Insufficient documentation

## 2017-03-17 DIAGNOSIS — E119 Type 2 diabetes mellitus without complications: Secondary | ICD-10-CM | POA: Insufficient documentation

## 2017-03-17 MED ORDER — SODIUM CHLORIDE 0.9% FLUSH
10.0000 mL | INTRAVENOUS | Status: DC | PRN
  Administered 2017-03-17: 10 mL
  Filled 2017-03-17: qty 10

## 2017-03-17 MED ORDER — HEPARIN SOD (PORK) LOCK FLUSH 100 UNIT/ML IV SOLN
500.0000 [IU] | Freq: Once | INTRAVENOUS | Status: AC | PRN
  Administered 2017-03-17: 500 [IU]

## 2017-03-17 MED ORDER — HEPARIN SOD (PORK) LOCK FLUSH 100 UNIT/ML IV SOLN
INTRAVENOUS | Status: AC
  Filled 2017-03-17: qty 5

## 2017-03-22 ENCOUNTER — Inpatient Hospital Stay: Payer: Medicare Other

## 2017-03-22 ENCOUNTER — Inpatient Hospital Stay (HOSPITAL_BASED_OUTPATIENT_CLINIC_OR_DEPARTMENT_OTHER): Payer: Medicare Other | Admitting: Urgent Care

## 2017-03-22 ENCOUNTER — Encounter: Payer: Self-pay | Admitting: Family Medicine

## 2017-03-22 ENCOUNTER — Ambulatory Visit: Payer: Medicare Other | Admitting: Family Medicine

## 2017-03-22 ENCOUNTER — Telehealth: Payer: Self-pay | Admitting: *Deleted

## 2017-03-22 VITALS — BP 144/69 | HR 85 | Temp 97.8°F | Resp 20 | Wt 272.6 lb

## 2017-03-22 DIAGNOSIS — I10 Essential (primary) hypertension: Secondary | ICD-10-CM

## 2017-03-22 DIAGNOSIS — D500 Iron deficiency anemia secondary to blood loss (chronic): Secondary | ICD-10-CM | POA: Diagnosis not present

## 2017-03-22 DIAGNOSIS — D509 Iron deficiency anemia, unspecified: Secondary | ICD-10-CM | POA: Diagnosis not present

## 2017-03-22 DIAGNOSIS — F418 Other specified anxiety disorders: Secondary | ICD-10-CM

## 2017-03-22 DIAGNOSIS — C3412 Malignant neoplasm of upper lobe, left bronchus or lung: Secondary | ICD-10-CM

## 2017-03-22 DIAGNOSIS — E1121 Type 2 diabetes mellitus with diabetic nephropathy: Secondary | ICD-10-CM | POA: Diagnosis not present

## 2017-03-22 DIAGNOSIS — C184 Malignant neoplasm of transverse colon: Secondary | ICD-10-CM

## 2017-03-22 DIAGNOSIS — I700 Atherosclerosis of aorta: Secondary | ICD-10-CM

## 2017-03-22 DIAGNOSIS — E119 Type 2 diabetes mellitus without complications: Secondary | ICD-10-CM

## 2017-03-22 DIAGNOSIS — D649 Anemia, unspecified: Secondary | ICD-10-CM

## 2017-03-22 DIAGNOSIS — C182 Malignant neoplasm of ascending colon: Secondary | ICD-10-CM

## 2017-03-22 DIAGNOSIS — N182 Chronic kidney disease, stage 2 (mild): Secondary | ICD-10-CM

## 2017-03-22 LAB — CBC WITH DIFFERENTIAL/PLATELET
Basophils Absolute: 0 10*3/uL (ref 0–0.1)
Basophils Relative: 1 %
Eosinophils Absolute: 0.2 10*3/uL (ref 0–0.7)
Eosinophils Relative: 3 %
HCT: 41.2 % (ref 40.0–52.0)
Hemoglobin: 13.6 g/dL (ref 13.0–18.0)
Lymphocytes Relative: 34 %
Lymphs Abs: 1.9 10*3/uL (ref 1.0–3.6)
MCH: 29.8 pg (ref 26.0–34.0)
MCHC: 32.9 g/dL (ref 32.0–36.0)
MCV: 90.5 fL (ref 80.0–100.0)
Monocytes Absolute: 0.3 10*3/uL (ref 0.2–1.0)
Monocytes Relative: 6 %
Neutro Abs: 3.1 10*3/uL (ref 1.4–6.5)
Neutrophils Relative %: 56 %
Platelets: 156 10*3/uL (ref 150–440)
RBC: 4.55 MIL/uL (ref 4.40–5.90)
RDW: 15.4 % — ABNORMAL HIGH (ref 11.5–14.5)
WBC: 5.6 10*3/uL (ref 3.8–10.6)

## 2017-03-22 LAB — BASIC METABOLIC PANEL
Anion gap: 10 (ref 5–15)
BUN: 22 mg/dL — ABNORMAL HIGH (ref 6–20)
CO2: 25 mmol/L (ref 22–32)
Calcium: 9.3 mg/dL (ref 8.9–10.3)
Chloride: 100 mmol/L — ABNORMAL LOW (ref 101–111)
Creatinine, Ser: 1.04 mg/dL (ref 0.61–1.24)
GFR calc Af Amer: >60 mL/min (ref >60)
GFR calc non Af Amer: >60 mL/min (ref >60)
Glucose, Bld: 99 mg/dL (ref 65–99)
Potassium: 3.9 mmol/L (ref 3.5–5.1)
Sodium: 135 mmol/L (ref 135–145)

## 2017-03-22 LAB — MAGNESIUM: Magnesium: 1.8 mg/dL (ref 1.7–2.4)

## 2017-03-22 NOTE — Progress Notes (Signed)
[facility] day:  03/22/2017  Chief Complaint: Donald Mcdowell. is a 72 y.o. male with stage IIIB colon cancer who is seen for his nadir assessment following cycle #1 FOLFOX chemotherapy.  HPI:   The patient was last seen in the medical oncology clinic on 03/15/2017.  At that time, patient was doing well. He denied any acute concerns. Bowel habits stable following colectomy. He denies any B symptoms or infections. Exam was stable. WBC 5.3 with an [place] of 3800. Hemoglobin 13.1, hematocrit 39.9, MCV 90.3, MCH 29.6, and platelets 156,000.  He received cycle #1 FOLFOX chemotherapy.  In the interim, patient has done well following his initial cycle chemotherapy. He denies nausea, vomiting, and increased diarrhea. Patient denies palmar-plantar erythrodysesthesia, neuropathy, and cold sensitivity associated with the current treatment regimen. Patient has not experienced any fevers, sweats, or significant weight loss. His appetite remains "good", with no demonstrated weight loss. Patient notes that he has not changed his diet "at all". Of note, patient is diabetic and takes oral medications daily. Since he began chemotherapy, he notes that his sugars have been "uncontrolled". Patient advises that his pre-prandial sugars have been as low as 65 and and high as 430. Patient states, "Why do I have to worry about my sugar. I have cancer. The blood sugar is the least of my worries".   Patient continues on oral iron supplementation. He is down to taking 1 pill a day and expresses wishes to stop if possible.    Past Medical History:  Diagnosis Date  . Abdominal aortic atherosclerosis ([place]) 09/15/2016   CT scan June 2018  . Allergy   . Cancer ([place])   . Coronary artery calcification seen on CAT scan 09/15/2016   Previous smoker; noted on CT scan June 2018  . Diabetes mellitus without complication ([place])   . GERD (gastroesophageal reflux disease)   . Gout   .  Hyperlipidemia   . Hypertension   . Kidney function abnormal    stage 2  . Kidney stones   . Obesity 08/20/2015    Past Surgical History:  Procedure Laterality Date  . COLONOSCOPY WITH PROPOFOL N/A 11/05/2016   Procedure: COLONOSCOPY WITH PROPOFOL;  Surgeon: Lucilla Lame, MD;  Location: [place];  Service: Gastroenterology;  Laterality: N/A;  . ELECTROMAGNETIC NAVIGATION BROCHOSCOPY N/A 12/16/2016   Procedure: ELECTROMAGNETIC NAVIGATION BRONCHOSCOPY;  Surgeon: Flora Lipps, MD;  Location: [facility];  Service: Cardiopulmonary;  Laterality: N/A;  . ESOPHAGOGASTRODUODENOSCOPY (EGD) WITH PROPOFOL N/A 11/05/2016   Procedure: ESOPHAGOGASTRODUODENOSCOPY (EGD) WITH PROPOFOL;  Surgeon: Lucilla Lame, MD;  Location: [place];  Service: Gastroenterology;  Laterality: N/A;  Diabetic - oral meds  . kidney stones    . PARTIAL COLECTOMY N/A 12/28/2016   Procedure: PARTIAL COLECTOMY RIGHT;  Surgeon: Jules Husbands, MD;  Location: [facility];  Service: General;  Laterality: N/A;  . POLYPECTOMY N/A 11/05/2016   Procedure: POLYPECTOMY;  Surgeon: Lucilla Lame, MD;  Location: [place];  Service: Gastroenterology;  Laterality: N/A;  . PORTACATH PLACEMENT Right 01/18/2017   Procedure: INSERTION PORT-A-CATH;  Surgeon: Jules Husbands, MD;  Location: [facility];  Service: General;  Laterality: Right;    Family History  Problem Relation Age of Onset  . Alzheimer's disease Mother   . Heart disease Father   . Diabetes Father   . Cancer Father        Prostate  . Cancer Sister        skin  cancer    Social History:  reports that he quit smoking about 9 months ago. His smoking use included cigarettes. he has never used smokeless tobacco. He reports that he does not drink alcohol or use drugs.  Patient worked for a [employer].  He retired in 1996.  He was in his 57s. Patient was a 1-2 pack per day smoker since he was a child.  He stopped smoking in 07/2016.  He drank alcohol in the  past.  He has 3 living sisters (1 sister died).  He has no children. Patient married to [place]. The patient is alone today.  Communicate with neighbor [name]  [phone].  Allergies: No Known Allergies  Current Medications: Current Outpatient Medications  Medication Sig Dispense Refill  . allopurinol (ZYLOPRIM) 300 MG tablet Take 1 tablet (300 mg total) by mouth daily. 90 tablet 3  . Cyanocobalamin (VITAMIN B-12 PO) Take 1 tablet by mouth daily.    Marland Kitchen glipiZIDE (GLUCOTROL XL) 10 MG 24 hr tablet Take 1 tablet (10 mg total) by mouth 2 (two) times daily. 180 tablet 1  . glucose blood (ACCU-CHEK COMPACT STRIPS) test strip 100 each by Other route as needed for other. Use as instructed, check FSBS once a day; LON 99 months, E11.9 100 each 1  . lidocaine-prilocaine (EMLA) cream Apply to affected area once 30 g 3  . Magnesium Oxide 250 MG TABS Take 250 mg by mouth every morning.     . metFORMIN (GLUCOPHAGE) 1000 MG tablet Take 1 tablet (1,000 mg total) by mouth 2 (two) times daily with a meal. 180 tablet 1  . mupirocin ointment (BACTROBAN) 2 %   0  . neomycin (MYCIFRADIN) 500 MG tablet   0  . pantoprazole (PROTONIX) 20 MG tablet Take 1 tablet (20 mg total) by mouth daily. 30 tablet 2  . quinapril (ACCUPRIL) 5 MG tablet TAKE 1 TABLET BY MOUTH ONCE DAILY 30 tablet 5  . rosuvastatin (CRESTOR) 10 MG tablet Take 1 tablet (10 mg total) by mouth at bedtime. 90 tablet 1  . saw palmetto 160 MG capsule Take 160 mg 2 (two) times daily by mouth.    . tamsulosin (FLOMAX) 0.4 MG CAPS capsule Take 0.4 mg by mouth daily.     No current facility-administered medications for this visit.     Review of Systems:  GENERAL:  Feels "well".  No fevers or sweats.  Intentional weight loss (272 pounds in 04/2016). Weight stable.  PERFORMANCE STATUS (ECOG):  1 HEENT:  No visual changes, runny nose, sore throat, mouth sores or tenderness. Lungs: No shortness of breath or cough.  No hemoptysis. Cardiac:  Murmur. No  chest pain, palpitations, orthopnea, or PND. EF 55-60%. GI:  No nausea, vomiting, constipation, or melena. GU:  No urgency, frequency, dysuria, or hematuria. Musculoskeletal:  No back pain.  No joint pain.  No muscle tenderness. Extremities:  No pain or swelling. Skin:  No rashes or skin changes. Neuro:  No headache, numbness or weakness, balance or coordination issues. Endocrine:  Diabetes.  No thyroid issues, hot flashes or night sweats. Psych:  No mood changes, depression or anxiety. Pain:  No focal pain. Review of systems:  All other systems reviewed and found to be negative.  Physical Exam: Blood pressure (!) 144/69, pulse 85, temperature 97.8 F (36.6 C), temperature source Tympanic, resp. rate 20, weight 272 lb 9 oz (123.6 kg). GENERAL:  Well developed, well nourished, heavyset gentleman sitting comfortably in the exam room in no acute  distress. MENTAL STATUS:  Alert and oriented to person, place and time. HEAD:  Waring a cap.  Thin Kerby Hockley hair.  Male pattern baldness.  Normocephalic, atraumatic, face symmetric, no Cushingoid features. EYES:  Glasses.  Blue eyes.  Pupils equal round and reactive to light and accomodation.  No conjunctivitis or scleral icterus. ENT:  Oropharynx clear without lesion.  Tongue normal. Mucous membranes moist.  RESPIRATORY:  Clear to auscultation without rales, wheezes or rhonchi. CARDIOVASCULAR:  Regular rate and rhythm without murmur, rub or gallop. CHEST WALL:  Port-a-cath in place.  ABDOMEN:  Midline abdominal incision.  Fully round.  Soft, non-tender, with active bowel sounds, and no appreciable hepatosplenomegaly.  No masses. SKIN:  No rashes, ulcers or lesions. EXTREMITIES: No edema, no skin discoloration or tenderness.  No palpable cords. LYMPH NODES: No palpable cervical, supraclavicular, axillary or inguinal adenopathy  NEUROLOGICAL: Unremarkable. PSYCH:  Appropriate.   Appointment on 03/22/2017  Component Date Value Ref Range Status  .  Magnesium 03/22/2017 1.8  1.7 - 2.4 mg/dL Final   Performed at [facility] [place], [address], [place], [place] [address]  . Sodium 03/22/2017 135  135 - 145 mmol/L Final  . Potassium 03/22/2017 3.9  3.5 - 5.1 mmol/L Final  . Chloride 03/22/2017 100* 101 - 111 mmol/L Final  . CO2 03/22/2017 25  22 - 32 mmol/L Final  . Glucose, Bld 03/22/2017 99  65 - 99 mg/dL Final  . BUN 03/22/2017 22* 6 - 20 mg/dL Final  . Creatinine, Ser 03/22/2017 1.04  0.61 - 1.24 mg/dL Final  . Calcium 03/22/2017 9.3  8.9 - 10.3 mg/dL Final  . GFR calc non Af Amer 03/22/2017 >60  >60 mL/min Final  . GFR calc Af Amer 03/22/2017 >60  >60 mL/min Final   Comment: (NOTE) The eGFR has been calculated using the CKD EPI equation. This calculation has not been validated in all clinical situations. eGFR's persistently <60 mL/min signify possible Chronic Kidney Disease.   Georgiann Hahn gap 03/22/2017 10  5 - 15 Final   Performed at [facility], [address] [place], [place] [address]  . WBC 03/22/2017 5.6  3.8 - 10.6 K/uL Final  . RBC 03/22/2017 4.55  4.40 - 5.90 MIL/uL Final  . Hemoglobin 03/22/2017 13.6  13.0 - 18.0 g/dL Final  . HCT 03/22/2017 41.2  40.0 - 52.0 % Final  . MCV 03/22/2017 90.5  80.0 - 100.0 fL Final  . MCH 03/22/2017 29.8  26.0 - 34.0 pg Final  . MCHC 03/22/2017 32.9  32.0 - 36.0 g/dL Final  . RDW 03/22/2017 15.4* 11.5 - 14.5 % Final  . Platelets 03/22/2017 156  150 - 440 K/uL Final  . Neutrophils Relative % 03/22/2017 56  % Final  . Neutro Abs 03/22/2017 3.1  1.4 - 6.5 K/uL Final  . Lymphocytes Relative 03/22/2017 34  % Final  . Lymphs Abs 03/22/2017 1.9  1.0 - 3.6 K/uL Final  . Monocytes Relative 03/22/2017 6  % Final  . Monocytes Absolute 03/22/2017 0.3  0.2 - 1.0 K/uL Final  . Eosinophils Relative 03/22/2017 3  % Final  . Eosinophils Absolute 03/22/2017 0.2  0 - 0.7 K/uL Final  . Basophils Relative 03/22/2017 1  % Final  . Basophils Absolute 03/22/2017 0.0  0 - 0.1 K/uL Final    Performed at [facility], [address] [place], [place] [address]    Assessment:  Tyri Elmore. is a 72 y.o. male with stage IIIB transverse  colon cancer s/p right partial colectomy on 12/28/2016.  Pathology revealed a 5.5 cm grade II invasive adenocarcinoma of the transverse colon, extending through the muscularis propria and into the adjacent adipose tissue, including the greater omentum. There was metastatic adenocarcinoma in multiple lymph nodes and tumor deposits, at least 5 involved with a total of at least 18 lymph nodes (5 of 18).  Pathologic stage was T3N2a.  CEA was 1.4 on 11/09/2016.  Abdomen and pelvic CT on 11/09/2016 revealed mid transverse colon segment of luminal narrowing with wall thickening compatible with colon cancer.  There was a single enlarged adjacent mesenteric lymph node and prominent subcentimeter lymph nodes anterior to the pancreas which may be metastatic.  There was no evidence of distant metastasis.  There was a left kidney lower pole complex cyst with thin internal septations (Bosniak IIF).  His prostate was enlarged and extended into the floor of bladder.  There was right kidney lower pole punctate nonobstructing nephrolithiasis.  Chest CT on 11/18/2016 revealed no metastatic disease.  There was abrupt termination of the left upper lobe beyond which the distal bronchus appeared dilated with mild branching into the lung parenchyma.  Imaging suggested a bronchocele in the medial aspect of the left upper lobe. Differential included an endobronchial polyp, endobronchial tumor, bronchial stricture or aspirated foreign body.   Electromagnetic navigational bronchoscopy (ENB) on 12/16/2016 revealed non-small cell carcinoma, favor squamous cell lung carcinoma.  Tumor was positive for p40 and negative for cdx-2 and TTF-1.  He received SBRT to the left upper lobe of 5000 cGy in 5 fractions from 02/16/2017 - 03/02/2017.  PET scan on 01/26/2017 revealed a 2.4 x 0.9  cm hypermetabolic medial left upper lobe pulmonary nodule, corresponding to known primary bronchogenic neoplasm. No findings suspicious for metastatic disease.  EGD on 11/05/2016 revealed gastritis.  Pathology confirmed chronic mild gastritis and features of a healing erosion in the stomach.  There was no H pylori, dysplasia or malignancy.  Echocardiogram  On 12/15/2016 revealed mild to moderate aortic valve stenosis. There was abnormal LV relaxation consistent with grade I diastolic dysfunction. Estimated EF was 55-60%.  He has iron deficiency anemia.  He is on oral iron.  Ferritin was 6 on 09/04/2016.  Hematocrit was 29.7 and hemoglobin 8.8 on 09/04/2016.  Hematocrit was 28.1, hemoglobin 9.3, and MCV 87.9 on 11/09/2016.   Symptomatically, he is doing well. He denies pain.  Exam is normal.  WBC is 5600 with an [place] of 3100. Hemoglobin is 13.6, hematocrit 41.2, and platelets 156,000.  Plan: 1.  Labs today: CBC with diff, CMP, Mg  2.  Review chemotherapy regimen and schedule. Side effects of medications reviewed and questions fielded. Initial cycle was dose adjusted, and since he tolerated it well, we will plan to increase to full dose for subsequent cycles. Patient will be seen every 2 weeks for 12 cycles of FOLFOX chemotherapy.  Labs will be drawn at each visit prior to chemotherapy.   3.  Review after treatment plans. Once chemotherapy treatments are completed, patient will be seen in clinic every 3 months for the first year, every 4 months for the second year, and every 6 months for the third through the fifth year. Scans will be performed at the end of treatment and then annually x 5 years.   4.  Discussed elevated blood glucose readings. Patient is diabetic. Patient admits to not following diabetic dietary recommendations. He is currently on oral antidiabetic agents. Patient receives concurrent corticosteroids with his chemotherapy treatments, thus he  is finding his CBGs difficult to manage.  Encouraged him to pre-prandial elevations with Dr. Sanda Klein later today when he sees her for routine follow up. Discuss that she may elect to have him start sliding scale insulin coverage for significant hyperglycemia episodes.  I reached out to Dr. Sanda Klein personally to discuss patient's blood glucose, and to ensure that she was aware of his concurrent steroid therapy with his chemotherapy treatments. Per Dr. Sanda Klein, patient has refused SSI. Patient has CBGs ranging from 65-430, therefore changing to a long acting agent is not a feasible option. Dr. Sanda Klein has elected not to make any changes at this time. Patient to monitor blood sugars closely at home and follow up with PCP should he continue to be unable to control.    5.  Discuss asymptomatic anemia. Hemoglobin 13.3 with a hematocrit of 41.2. Patient is normocytic; MCV 90.5, MCH 29.8. Patient is currently taking oral iron supplementation daily, however expresses the desire to discontinue. Patient reminded that his hemoglobin was as low as 9.4 back in October 2018. Patient encouraged to continue oral iron at this time, however he indicated that he was going to speak with PCP about it later today.  6.  RTC on 03/29/2017 for MD assessment, labs (CBC with diff, CMP, Mg), and cycle #2 FOLFOX.  7.  RTC on 03/31/2017 for pump disconnect.    [name], NP  03/22/2017, 4:20 PM

## 2017-03-22 NOTE — Assessment & Plan Note (Addendum)
Foot exam by MD today; monitor A1c every 3 months if 7 or higher, every 6 months when controlled and under 7

## 2017-03-22 NOTE — Progress Notes (Signed)
BP (!) 142/64 (BP Location: Left Arm, Patient Position: Sitting, Cuff Size: Large)   Pulse 100   Temp 98.4 F (36.9 C) (Oral)   Wt 272 lb 8 oz (123.6 kg)   SpO2 98%   BMI 38.01 kg/m    Subjective:    Patient ID: Donald Pou., male    DOB: 30-Jan-1946, 72 y.o.   MRN: [id number]  HPI: Donald Mcdowell is a 72 y.o. male  Chief Complaint  Patient presents with  . Follow-up    3wk f/u     HPI Patient is here for f/u; lung cancer and colon cancer Had labs done today; white count is normal; H/H is normal Creatinine is normal Magnesium is normal  Type 2 diabetes; sugars are doing okay; on medicines; had two pieces of cheese toast and took his medicine, then had chemo, had steroids too which is affecting his sugar; they brought him a sandwich and bag of chips and vanilla pudding and two cookies; they didn't give him a sugary soda though; patient got home and checked his sugar after 4 pm and it was 354; he says the chemo is making his sugars going up; he says he is not taking any insulin; he does not want to decrease his steroids; no low sugars; lowest 65; feels the same at 350 or 65; this morning, 197 fasting he thinks, then sausage egg and cheese sandwich, coffee, then two hours later it was 234; at 10 am it was 192  Lab Results  Component Value Date   HGBA1C 7.0 (H) 03/01/2017   No longer anemic; he says that the doctor Aaron Edelman) said it was up to me to stop the iron pill   PET scan Nov 2018: IMPRESSION: 2.4 x 0.9 cm hypermetabolic medial left upper lobe pulmonary nodule, corresponding to known primary bronchogenic neoplasm.  Status post right hemicolectomy.  No findings suspicious for metastatic disease.   Electronically Signed   By: Julian Hy M.D.   On: 01/26/2017 11:05  Mood is doing better  No flares of gout  He got the flu shot this year  He walked a mile this morning at 7 am  He does not usually eat chips; he does not add salt to his  food except to a 'mater sandwich; he says that the treatments could raise his BP  Depression screen [facility] 2/9 03/22/2017 03/01/2017 01/20/2017 12/14/2016 10/13/2016  Decreased Interest 0 2 0 0 0  Down, Depressed, Hopeless 0 3 0 0 0  PHQ - 2 Score 0 5 0 0 0  Altered sleeping - 3 - - -  Tired, decreased energy - 3 - - -  Change in appetite - 0 - - -  Feeling bad or failure about yourself  - 3 - - -  Trouble concentrating - 1 - - -  Moving slowly or fidgety/restless - 0 - - -  Suicidal thoughts - 3 - - -  PHQ-9 Score - 18 - - -  Difficult doing work/chores - Very difficult - - -    Relevant past medical, surgical, family and social history reviewed Past Medical History:  Diagnosis Date  . Abdominal aortic atherosclerosis ([place]) 09/15/2016   CT scan June 2018  . Allergy   . Cancer ([place])   . Coronary artery calcification seen on CAT scan 09/15/2016   Previous smoker; noted on CT scan June 2018  . Diabetes mellitus without complication ([place])   . GERD (gastroesophageal reflux disease)   .  Gout   . Hyperlipidemia   . Hypertension   . Kidney function abnormal    stage 2  . Kidney stones   . Obesity 08/20/2015   Past Surgical History:  Procedure Laterality Date  . COLONOSCOPY WITH PROPOFOL N/A 11/05/2016   Procedure: COLONOSCOPY WITH PROPOFOL;  Surgeon: Lucilla Lame, MD;  Location: [place];  Service: Gastroenterology;  Laterality: N/A;  . ELECTROMAGNETIC NAVIGATION BROCHOSCOPY N/A 12/16/2016   Procedure: ELECTROMAGNETIC NAVIGATION BRONCHOSCOPY;  Surgeon: Flora Lipps, MD;  Location: [facility];  Service: Cardiopulmonary;  Laterality: N/A;  . ESOPHAGOGASTRODUODENOSCOPY (EGD) WITH PROPOFOL N/A 11/05/2016   Procedure: ESOPHAGOGASTRODUODENOSCOPY (EGD) WITH PROPOFOL;  Surgeon: Lucilla Lame, MD;  Location: [place];  Service: Gastroenterology;  Laterality: N/A;  Diabetic - oral meds  . kidney stones    . PARTIAL COLECTOMY N/A 12/28/2016   Procedure: PARTIAL COLECTOMY RIGHT;   Surgeon: Jules Husbands, MD;  Location: [facility];  Service: General;  Laterality: N/A;  . POLYPECTOMY N/A 11/05/2016   Procedure: POLYPECTOMY;  Surgeon: Lucilla Lame, MD;  Location: [place];  Service: Gastroenterology;  Laterality: N/A;  . PORTACATH PLACEMENT Right 01/18/2017   Procedure: INSERTION PORT-A-CATH;  Surgeon: Jules Husbands, MD;  Location: [facility];  Service: General;  Laterality: Right;   Family History  Problem Relation Age of Onset  . Alzheimer's disease Mother   . Heart disease Father   . Diabetes Father   . Cancer Father        Prostate  . Cancer Sister        skin cancer   Social History   Tobacco Use  . Smoking status: Former Smoker    Types: Cigarettes    Last attempt to quit: 06/2016    Years since quitting: 0.7  . Smokeless tobacco: Never Used  Substance Use Topics  . Alcohol use: No    Alcohol/week: 0.0 oz  . Drug use: No    Interim medical history since last visit reviewed. Allergies and medications reviewed  Review of Systems Per HPI unless specifically indicated above     Objective:    BP (!) 142/64 (BP Location: Left Arm, Patient Position: Sitting, Cuff Size: Large)   Pulse 100   Temp 98.4 F (36.9 C) (Oral)   Wt 272 lb 8 oz (123.6 kg)   SpO2 98%   BMI 38.01 kg/m   Wt Readings from Last 3 Encounters:  03/22/17 272 lb 8 oz (123.6 kg)  03/22/17 272 lb 9 oz (123.6 kg)  03/15/17 271 lb 3.2 oz (123 kg)    Physical Exam  Constitutional: He appears well-developed and well-nourished. No distress.  Obese, weight overall stable  HENT:  Head: Normocephalic and atraumatic.  Eyes: EOM are normal. No scleral icterus.  Neck: No thyromegaly present.  Cardiovascular: Normal rate and regular rhythm.  Heart rate under 100 during auscultation  Pulmonary/Chest: Effort normal and breath sounds normal.  Port site upper RIGHT side chest wall  Abdominal: Soft. Bowel sounds are normal. He exhibits no distension. There is no tenderness. There  is no guarding.  Musculoskeletal: He exhibits no edema.  Neurological: Coordination normal.  Skin: Skin is warm and dry. He is not diaphoretic.  Nailbeds are pink  Psychiatric: He has a normal mood and affect. His behavior is normal. Judgment and thought content normal.   Diabetic Foot Form - Detailed   Diabetic Foot Exam - detailed Diabetic Foot exam was performed with the following findings:  Yes 03/22/2017  3:16 PM  Visual  Foot Exam completed.:  Yes  Pulse Foot Exam completed.:  Yes  Right Dorsalis Pedis:  Present Left Dorsalis Pedis:  Present  Sensory Foot Exam Completed.:  Yes Semmes-Weinstein Monofilament Test R Site 1-Great Toe:  Pos L Site 1-Great Toe:  Pos           Assessment & Plan:   Problem List Items Addressed This Visit      Cardiovascular and Mediastinum   Essential hypertension, benign    DASH guidelines; see AVS      Abdominal aortic atherosclerosis ([place])    Noted on previous CT scan; goal LDL less than 70        Respiratory   Malignant neoplasm of upper lobe of left lung ([place])    Managed by specialist        Digestive   Malignant neoplasm of transverse colon ([place])    Managed by specialist        Endocrine   Controlled diabetes mellitus with diabetic nephropathy ([place])    Foot exam by MD today; monitor A1c every 3 months if 7 or higher, every 6 months when controlled and under 7        Genitourinary   Chronic kidney disease (CKD), stage II (mild) (Chronic)    Avoid NSAIDS        Other   Morbid obesity ([place])    BMI greater than 35 with diabetes; patient is unsuccessful with weight loss      Iron deficiency anemia    Appears to have resolved, no further bleeding      Anxiety about health    Doing better; supportive listening          Follow up plan: Return in about 3 months (around 07/01/2017) for twenty minute follow-up with fasting labs.  An after-visit summary was printed and given to the patient at [facility].  Please see  the patient instructions which may contain other information and recommendations beyond what is mentioned above in the assessment and plan.  No orders of the defined types were placed in this encounter.   No orders of the defined types were placed in this encounter.

## 2017-03-22 NOTE — Telephone Encounter (Signed)
Per Nira Conn 03/22/17 scheduler mess patient was a NO SHOW  And to Reschedule patient on 03/24/17 for lab/ MD Patient is aware.

## 2017-03-22 NOTE — Progress Notes (Signed)
Patient offers no complaints today. 

## 2017-03-22 NOTE — Patient Instructions (Addendum)
Try to follow the DASH guidelines (DASH stands for Dietary Approaches to Stop Hypertension). Try to limit the sodium in your diet to no more than 1,500mg  of sodium per day. Certainly try to not exceed 2,000 mg per day at the very most. Do not add salt when cooking or at the table.  Check the sodium amount on labels when shopping, and choose items lower in sodium when given a choice. Avoid or limit foods that already contain a lot of sodium. Eat a diet rich in fruits and vegetables and whole grains, and try to lose weight if overweight or obese Monitor your blood sugar and call me if consistently over 200   DASH Eating Plan DASH stands for "Dietary Approaches to Stop Hypertension." The DASH eating plan is a healthy eating plan that has been shown to reduce high blood pressure (hypertension). It may also reduce your risk for type 2 diabetes, heart disease, and stroke. The DASH eating plan may also help with weight loss. What are tips for following this plan? General guidelines  Avoid eating more than 2,300 mg (milligrams) of salt (sodium) a day. If you have hypertension, you may need to reduce your sodium intake to 1,500 mg a day.  Limit alcohol intake to no more than 1 drink a day for nonpregnant women and 2 drinks a day for men. One drink equals 12 oz of beer, 5 oz of wine, or 1 oz of hard liquor.  Work with your health care provider to maintain a healthy body weight or to lose weight. Ask what an ideal weight is for you.  Get at least 30 minutes of exercise that causes your heart to beat faster (aerobic exercise) most days of the week. Activities may include walking, swimming, or biking.  Work with your health care provider or diet and nutrition specialist (dietitian) to adjust your eating plan to your individual calorie needs. Reading food labels  Check food labels for the amount of sodium per serving. Choose foods with less than 5 percent of the Daily Value of sodium. Generally, foods with  less than 300 mg of sodium per serving fit into this eating plan.  To find whole grains, look for the word "whole" as the first word in the ingredient list. Shopping  Buy products labeled as "low-sodium" or "no salt added."  Buy fresh foods. Avoid canned foods and premade or frozen meals. Cooking  Avoid adding salt when cooking. Use salt-free seasonings or herbs instead of table salt or sea salt. Check with your health care provider or pharmacist before using salt substitutes.  Do not fry foods. Cook foods using healthy methods such as baking, boiling, grilling, and broiling instead.  Cook with heart-healthy oils, such as olive, canola, soybean, or sunflower oil. Meal planning   Eat a balanced diet that includes: ? 5 or more servings of fruits and vegetables each day. At each meal, try to fill half of your plate with fruits and vegetables. ? Up to 6-8 servings of whole grains each day. ? Less than 6 oz of lean meat, poultry, or fish each day. A 3-oz serving of meat is about the same size as a deck of cards. One egg equals 1 oz. ? 2 servings of low-fat dairy each day. ? A serving of nuts, seeds, or beans 5 times each week. ? Heart-healthy fats. Healthy fats called Omega-3 fatty acids are found in foods such as flaxseeds and coldwater fish, like sardines, salmon, and mackerel.  Limit how much you  eat of the following: ? Canned or prepackaged foods. ? Food that is high in trans fat, such as fried foods. ? Food that is high in saturated fat, such as fatty meat. ? Sweets, desserts, sugary drinks, and other foods with added sugar. ? Full-fat dairy products.  Do not salt foods before eating.  Try to eat at least 2 vegetarian meals each week.  Eat more home-cooked food and less restaurant, buffet, and fast food.  When eating at a restaurant, ask that your food be prepared with less salt or no salt, if possible. What foods are recommended? The items listed may not be a complete list.  Talk with your dietitian about what dietary choices are best for you. Grains Whole-grain or whole-wheat bread. Whole-grain or whole-wheat pasta. Brown rice. Modena Morrow. Bulgur. Whole-grain and low-sodium cereals. Pita bread. Low-fat, low-sodium crackers. Whole-wheat flour tortillas. Vegetables Fresh or frozen vegetables (raw, steamed, roasted, or grilled). Low-sodium or reduced-sodium tomato and vegetable juice. Low-sodium or reduced-sodium tomato sauce and tomato paste. Low-sodium or reduced-sodium canned vegetables. Fruits All fresh, dried, or frozen fruit. Canned fruit in natural juice (without added sugar). Meat and other protein foods Skinless chicken or [place]. Ground chicken or [place]. Pork with fat trimmed off. Fish and seafood. Egg whites. Dried beans, peas, or lentils. Unsalted nuts, nut butters, and seeds. Unsalted canned beans. Lean cuts of beef with fat trimmed off. Low-sodium, lean deli meat. Dairy Low-fat (1%) or fat-free (skim) milk. Fat-free, low-fat, or reduced-fat cheeses. Nonfat, low-sodium ricotta or cottage cheese. Low-fat or nonfat yogurt. Low-fat, low-sodium cheese. Fats and oils Soft margarine without trans fats. Vegetable oil. Low-fat, reduced-fat, or light mayonnaise and salad dressings (reduced-sodium). Canola, safflower, olive, soybean, and sunflower oils. Avocado. Seasoning and other foods Herbs. Spices. Seasoning mixes without salt. Unsalted popcorn and pretzels. Fat-free sweets. What foods are not recommended? The items listed may not be a complete list. Talk with your dietitian about what dietary choices are best for you. Grains Baked goods made with fat, such as croissants, muffins, or some breads. Dry pasta or rice meal packs. Vegetables Creamed or fried vegetables. Vegetables in a cheese sauce. Regular canned vegetables (not low-sodium or reduced-sodium). Regular canned tomato sauce and paste (not low-sodium or reduced-sodium). Regular tomato and vegetable  juice (not low-sodium or reduced-sodium). Angie Fava. Olives. Fruits Canned fruit in a light or heavy syrup. Fried fruit. Fruit in cream or butter sauce. Meat and other protein foods Fatty cuts of meat. Ribs. Fried meat. Berniece Salines. Sausage. Bologna and other processed lunch meats. Salami. Fatback. Hotdogs. Bratwurst. Salted nuts and seeds. Canned beans with added salt. Canned or smoked fish. Whole eggs or egg yolks. Chicken or [place] with skin. Dairy Whole or 2% milk, cream, and half-and-half. Whole or full-fat cream cheese. Whole-fat or sweetened yogurt. Full-fat cheese. Nondairy creamers. Whipped toppings. Processed cheese and cheese spreads. Fats and oils Butter. Stick margarine. Lard. Shortening. Ghee. Bacon fat. Tropical oils, such as coconut, palm kernel, or palm oil. Seasoning and other foods Salted popcorn and pretzels. Onion salt, garlic salt, seasoned salt, table salt, and sea salt. Worcestershire sauce. Tartar sauce. Barbecue sauce. Teriyaki sauce. Soy sauce, including reduced-sodium. Steak sauce. Canned and packaged gravies. Fish sauce. Oyster sauce. Cocktail sauce. Horseradish that you find on the shelf. Ketchup. Mustard. Meat flavorings and tenderizers. Bouillon cubes. Hot sauce and Tabasco sauce. Premade or packaged marinades. Premade or packaged taco seasonings. Relishes. Regular salad dressings. Where to find more information:  National Heart, Lung, and Blood Institute: https://wilson-eaton.com/  American  Heart Association: www.heart.org Summary  The DASH eating plan is a healthy eating plan that has been shown to reduce high blood pressure (hypertension). It may also reduce your risk for type 2 diabetes, heart disease, and stroke.  With the DASH eating plan, you should limit salt (sodium) intake to 2,300 mg a day. If you have hypertension, you may need to reduce your sodium intake to 1,500 mg a day.  When on the DASH eating plan, aim to eat more fresh fruits and vegetables, whole grains,  lean proteins, low-fat dairy, and heart-healthy fats.  Work with your health care provider or diet and nutrition specialist (dietitian) to adjust your eating plan to your individual calorie needs. This information is not intended to replace advice given to you by your health care provider. Make sure you discuss any questions you have with your health care provider. Document Released: 02/19/2011 Document Revised: 02/24/2016 Document Reviewed: 02/24/2016 Elsevier Interactive Patient Education  [employer].

## 2017-03-23 NOTE — Telephone Encounter (Signed)
Correction to previous note.Marland KitchenMarland KitchenMarland KitchenPlease Disregard!

## 2017-03-28 DIAGNOSIS — I10 Essential (primary) hypertension: Secondary | ICD-10-CM | POA: Insufficient documentation

## 2017-03-28 NOTE — Assessment & Plan Note (Signed)
Managed by specialist 

## 2017-03-28 NOTE — Assessment & Plan Note (Signed)
DASH guidelines; see AVS

## 2017-03-28 NOTE — Assessment & Plan Note (Signed)
Avoid NSAIDS

## 2017-03-28 NOTE — Assessment & Plan Note (Signed)
BMI greater than 35 with diabetes; patient is unsuccessful with weight loss

## 2017-03-28 NOTE — Assessment & Plan Note (Signed)
Noted on previous CT scan; goal LDL less than 70

## 2017-03-28 NOTE — Assessment & Plan Note (Signed)
Doing better; supportive listening

## 2017-03-28 NOTE — Assessment & Plan Note (Signed)
Appears to have resolved, no further bleeding

## 2017-03-29 ENCOUNTER — Inpatient Hospital Stay: Payer: Medicare Other

## 2017-03-29 ENCOUNTER — Other Ambulatory Visit: Payer: Self-pay

## 2017-03-29 ENCOUNTER — Encounter: Payer: Self-pay | Admitting: Hematology and Oncology

## 2017-03-29 ENCOUNTER — Inpatient Hospital Stay (HOSPITAL_BASED_OUTPATIENT_CLINIC_OR_DEPARTMENT_OTHER): Payer: Medicare Other | Admitting: Hematology and Oncology

## 2017-03-29 VITALS — BP 132/67 | HR 85 | Temp 97.2°F | Wt 272.5 lb

## 2017-03-29 DIAGNOSIS — C184 Malignant neoplasm of transverse colon: Secondary | ICD-10-CM

## 2017-03-29 DIAGNOSIS — D509 Iron deficiency anemia, unspecified: Secondary | ICD-10-CM

## 2017-03-29 DIAGNOSIS — Z5111 Encounter for antineoplastic chemotherapy: Secondary | ICD-10-CM

## 2017-03-29 DIAGNOSIS — Z7189 Other specified counseling: Secondary | ICD-10-CM

## 2017-03-29 DIAGNOSIS — D500 Iron deficiency anemia secondary to blood loss (chronic): Secondary | ICD-10-CM

## 2017-03-29 LAB — CBC WITH DIFFERENTIAL/PLATELET
Basophils Absolute: 0 10*3/uL (ref 0–0.1)
Basophils Relative: 1 %
Eosinophils Absolute: 0.1 10*3/uL (ref 0–0.7)
Eosinophils Relative: 3 %
HCT: 35.5 % — ABNORMAL LOW (ref 40.0–52.0)
Hemoglobin: 12 g/dL — ABNORMAL LOW (ref 13.0–18.0)
Lymphocytes Relative: 30 %
Lymphs Abs: 1.2 10*3/uL (ref 1.0–3.6)
MCH: 30.5 pg (ref 26.0–34.0)
MCHC: 33.9 g/dL (ref 32.0–36.0)
MCV: 90.1 fL (ref 80.0–100.0)
Monocytes Absolute: 0.3 10*3/uL (ref 0.2–1.0)
Monocytes Relative: 8 %
Neutro Abs: 2.4 10*3/uL (ref 1.4–6.5)
Neutrophils Relative %: 58 %
Platelets: 158 10*3/uL (ref 150–440)
RBC: 3.94 MIL/uL — ABNORMAL LOW (ref 4.40–5.90)
RDW: 15.8 % — ABNORMAL HIGH (ref 11.5–14.5)
WBC: 4 10*3/uL (ref 3.8–10.6)

## 2017-03-29 LAB — COMPREHENSIVE METABOLIC PANEL
ALT: 20 U/L (ref 17–63)
AST: 22 U/L (ref 15–41)
Albumin: 3.8 g/dL (ref 3.5–5.0)
Alkaline Phosphatase: 44 U/L (ref 38–126)
Anion gap: 7 (ref 5–15)
BUN: 18 mg/dL (ref 6–20)
CO2: 25 mmol/L (ref 22–32)
Calcium: 8.9 mg/dL (ref 8.9–10.3)
Chloride: 105 mmol/L (ref 101–111)
Creatinine, Ser: 1.18 mg/dL (ref 0.61–1.24)
GFR calc Af Amer: >60 mL/min (ref >60)
GFR calc non Af Amer: >60 mL/min (ref >60)
Glucose, Bld: 191 mg/dL — ABNORMAL HIGH (ref 65–99)
Potassium: 4.2 mmol/L (ref 3.5–5.1)
Sodium: 137 mmol/L (ref 135–145)
Total Bilirubin: 0.6 mg/dL (ref 0.3–1.2)
Total Protein: 6.8 g/dL (ref 6.5–8.1)

## 2017-03-29 LAB — MAGNESIUM: Magnesium: 2 mg/dL (ref 1.7–2.4)

## 2017-03-29 LAB — FERRITIN: Ferritin: 21 ng/mL — ABNORMAL LOW (ref 24–336)

## 2017-03-29 MED ORDER — SODIUM CHLORIDE 0.9 % IV SOLN
2400.0000 mg/m2 | INTRAVENOUS | Status: DC
  Administered 2017-03-29: 5950 mg via INTRAVENOUS
  Filled 2017-03-29: qty 119

## 2017-03-29 MED ORDER — DEXAMETHASONE SODIUM PHOSPHATE 10 MG/ML IJ SOLN
10.0000 mg | Freq: Once | INTRAMUSCULAR | Status: AC
  Administered 2017-03-29: 10 mg via INTRAVENOUS
  Filled 2017-03-29: qty 1

## 2017-03-29 MED ORDER — DEXTROSE 5 % IV SOLN
85.0000 mg/m2 | Freq: Once | INTRAVENOUS | Status: AC
  Administered 2017-03-29: 210 mg via INTRAVENOUS
  Filled 2017-03-29: qty 40

## 2017-03-29 MED ORDER — PALONOSETRON HCL INJECTION 0.25 MG/5ML
0.2500 mg | Freq: Once | INTRAVENOUS | Status: AC
  Administered 2017-03-29: 0.25 mg via INTRAVENOUS
  Filled 2017-03-29: qty 5

## 2017-03-29 MED ORDER — DEXTROSE 5 % IV SOLN
1000.0000 mg | Freq: Once | INTRAVENOUS | Status: AC
  Administered 2017-03-29: 1000 mg via INTRAVENOUS
  Filled 2017-03-29: qty 50

## 2017-03-29 MED ORDER — DEXTROSE 5 % IV SOLN
Freq: Once | INTRAVENOUS | Status: AC
  Administered 2017-03-29: 10:00:00 via INTRAVENOUS
  Filled 2017-03-29: qty 1000

## 2017-03-29 MED ORDER — FLUOROURACIL CHEMO INJECTION 2.5 GM/50ML
400.0000 mg/m2 | Freq: Once | INTRAVENOUS | Status: AC
  Administered 2017-03-29: 1000 mg via INTRAVENOUS
  Filled 2017-03-29: qty 20

## 2017-03-29 MED ORDER — SODIUM CHLORIDE 0.9 % IV SOLN: 10.0000 mg | Freq: Once | INTRAVENOUS | Status: DC

## 2017-03-29 NOTE — Progress Notes (Signed)
[facility] day:  03/29/2017  Chief Complaint: Donald Mcdowell. is a 72 y.o. male with stage IIIB colon cancer who is seen for assessment prior to cycle #2 FOLFOX chemotherapy.  HPI:   The patient was last seen in the medical oncology clinic on 03/22/2017 by [name], NP.  At that time, he was seen for nadir assessment following cycle #1 FOLFOX chemotherapy.  He was doing well. He denied any pain.  Exam was normal.  WBC was 5600 with an ANC of 3100. Hemoglobin was 13.6, hematocrit 41.2, and platelets 156,000.  During the interim, patient continues to do well overall. Patient has not experienced any nausea, vomiting, or diarrhea. He states, "It is just like it was before I came here".  He denies palmar-plantar erythrodysesthesia, neuropathy, and cold sensitivity associated with the current treatment regimen. Patient denies fevers, sweats, and weight loss. He maintains a stable appetite, with no weight loss demonstrated.   Patient has had no interval infections. He denies pain.   Past Medical History:  Diagnosis Date  . Abdominal aortic atherosclerosis ([place]) 09/15/2016   CT scan June 2018  . Allergy   . Cancer ([place])   . Coronary artery calcification seen on CAT scan 09/15/2016   Previous smoker; noted on CT scan June 2018  . Diabetes mellitus without complication ([place])   . GERD (gastroesophageal reflux disease)   . Gout   . Hyperlipidemia   . Hypertension   . Kidney function abnormal    stage 2  . Kidney stones   . Obesity 08/20/2015    Past Surgical History:  Procedure Laterality Date  . COLONOSCOPY WITH PROPOFOL N/A 11/05/2016   Procedure: COLONOSCOPY WITH PROPOFOL;  Surgeon: Lucilla Lame, MD;  Location: [facility];  Service: Gastroenterology;  Laterality: N/A;  . ELECTROMAGNETIC NAVIGATION BROCHOSCOPY N/A 12/16/2016   Procedure: ELECTROMAGNETIC NAVIGATION BRONCHOSCOPY;  Surgeon: Flora Lipps, MD;  Location: [facility];  Service:  Cardiopulmonary;  Laterality: N/A;  . ESOPHAGOGASTRODUODENOSCOPY (EGD) WITH PROPOFOL N/A 11/05/2016   Procedure: ESOPHAGOGASTRODUODENOSCOPY (EGD) WITH PROPOFOL;  Surgeon: Lucilla Lame, MD;  Location: [facility];  Service: Gastroenterology;  Laterality: N/A;  Diabetic - oral meds  . kidney stones    . PARTIAL COLECTOMY N/A 12/28/2016   Procedure: PARTIAL COLECTOMY RIGHT;  Surgeon: Jules Husbands, MD;  Location: [facility];  Service: General;  Laterality: N/A;  . POLYPECTOMY N/A 11/05/2016   Procedure: POLYPECTOMY;  Surgeon: Lucilla Lame, MD;  Location: [facility];  Service: Gastroenterology;  Laterality: N/A;  . PORTACATH PLACEMENT Right 01/18/2017   Procedure: INSERTION PORT-A-CATH;  Surgeon: Jules Husbands, MD;  Location: [facility];  Service: General;  Laterality: Right;    Family History  Problem Relation Age of Onset  . Alzheimer's disease Mother   . Heart disease Father   . Diabetes Father   . Cancer Father        Prostate  . Cancer Sister        skin cancer    Social History:  reports that he quit smoking about 9 months ago. His smoking use included cigarettes. he has never used smokeless tobacco. He reports that he does not drink alcohol or use drugs.  Patient worked for a [employer].  He retired in 1996.  He was in his 39s. Patient was a 1-2 pack per day smoker since he was a child.  He stopped smoking in 07/2016.  He drank alcohol in the past.  He has 3 living sisters (1 sister died).  He has no children. Patient married to [place]. The patient is alone today.  Communicate with neighbor [name]  [phone].  Allergies: No Known Allergies  Current Medications: Current Outpatient Medications  Medication Sig Dispense Refill  . allopurinol (ZYLOPRIM) 300 MG tablet Take 1 tablet (300 mg total) by mouth daily. 90 tablet 3  . Cyanocobalamin (VITAMIN B-12 PO) Take 1 tablet by mouth daily.    Marland Kitchen glipiZIDE (GLUCOTROL XL) 10 MG 24 hr tablet Take 1 tablet (10 mg  total) by mouth 2 (two) times daily. 180 tablet 1  . glucose blood (ACCU-CHEK COMPACT STRIPS) test strip 100 each by Other route as needed for other. Use as instructed, check FSBS once a day; LON 99 months, E11.9 100 each 1  . lidocaine-prilocaine (EMLA) cream Apply to affected area once 30 g 3  . Magnesium Oxide 250 MG TABS Take 250 mg by mouth every morning.     . metFORMIN (GLUCOPHAGE) 1000 MG tablet Take 1 tablet (1,000 mg total) by mouth 2 (two) times daily with a meal. 180 tablet 1  . mupirocin ointment (BACTROBAN) 2 %   0  . neomycin (MYCIFRADIN) 500 MG tablet   0  . pantoprazole (PROTONIX) 20 MG tablet Take 1 tablet (20 mg total) by mouth daily. 30 tablet 2  . quinapril (ACCUPRIL) 5 MG tablet TAKE 1 TABLET BY MOUTH ONCE DAILY 30 tablet 5  . rosuvastatin (CRESTOR) 10 MG tablet Take 1 tablet (10 mg total) by mouth at bedtime. 90 tablet 1  . saw palmetto 160 MG capsule Take 160 mg 2 (two) times daily by mouth.    . tamsulosin (FLOMAX) 0.4 MG CAPS capsule Take 0.4 mg by mouth daily.     No current facility-administered medications for this visit.     Review of Systems:  GENERAL:  Feels "well".  No fevers or sweats.  Intentional weight loss (272 pounds in 04/2016). Weight stable.  PERFORMANCE STATUS (ECOG):  1 HEENT:  No visual changes, runny nose, sore throat, mouth sores or tenderness. Lungs: No shortness of breath or cough.  No hemoptysis. Cardiac:  Murmur. No chest pain, palpitations, orthopnea, or PND. EF 55-60%. GI:  No nausea, vomiting, constipation, or melena. GU:  No urgency, frequency, dysuria, or hematuria. Musculoskeletal:  No back pain.  No joint pain.  No muscle tenderness. Extremities:  No pain or swelling. Skin:  No rashes or skin changes. Neuro:  No headache, numbness or weakness, balance or coordination issues. Endocrine:  Diabetes.  No thyroid issues, hot flashes or night sweats. Psych:  No mood changes, depression or anxiety. Pain:  No focal pain. Review of  systems:  All other systems reviewed and found to be negative.  Physical Exam: Blood pressure 132/67, pulse 85, temperature (!) 97.2 F (36.2 C), temperature source Tympanic, weight 272 lb 8 oz (123.6 kg). GENERAL:  Well developed, well nourished, heavyset gentleman sitting comfortably in the exam room in no acute distress. MENTAL STATUS:  Alert and oriented to person, place and time. HEAD:  Waring a cap.  Thin gray hair.  Male pattern baldness.  Normocephalic, atraumatic, face symmetric, no Cushingoid features. EYES:  Glasses.  Blue eyes.  Pupils equal round and reactive to light and accomodation.  No conjunctivitis or scleral icterus. ENT:  Oropharynx clear without lesion.  Tongue normal. Mucous membranes moist.  RESPIRATORY:  Clear to auscultation without rales, wheezes or rhonchi. CARDIOVASCULAR:  Regular rate and rhythm without murmur,  rub or gallop. CHEST WALL:  Port-a-cath in place.  ABDOMEN:  Fully round.  Soft, non-tender, with active bowel sounds, and no appreciable hepatosplenomegaly.  No masses. SKIN:  No rashes, ulcers or lesions. EXTREMITIES: No edema, no skin discoloration or tenderness.  No palpable cords. LYMPH NODES: No palpable cervical, supraclavicular, axillary or inguinal adenopathy  NEUROLOGICAL: Unremarkable. PSYCH:  Appropriate.   Infusion on 03/29/2017  Component Date Value Ref Range Status  . Magnesium 03/29/2017 2.0  1.7 - 2.4 mg/dL Final   Performed at [facility], [address], [place], [place] [address]  . Sodium 03/29/2017 137  135 - 145 mmol/L Final  . Potassium 03/29/2017 4.2  3.5 - 5.1 mmol/L Final  . Chloride 03/29/2017 105  101 - 111 mmol/L Final  . CO2 03/29/2017 25  22 - 32 mmol/L Final  . Glucose, Bld 03/29/2017 191* 65 - 99 mg/dL Final  . BUN 03/29/2017 18  6 - 20 mg/dL Final  . Creatinine, Ser 03/29/2017 1.18  0.61 - 1.24 mg/dL Final  . Calcium 03/29/2017 8.9  8.9 - 10.3 mg/dL Final  . Total Protein 03/29/2017 6.8  6.5 - 8.1 g/dL  Final  . Albumin 03/29/2017 3.8  3.5 - 5.0 g/dL Final  . AST 03/29/2017 22  15 - 41 U/L Final  . ALT 03/29/2017 20  17 - 63 U/L Final  . Alkaline Phosphatase 03/29/2017 44  38 - 126 U/L Final  . Total Bilirubin 03/29/2017 0.6  0.3 - 1.2 mg/dL Final  . GFR calc non Af Amer 03/29/2017 >60  >60 mL/min Final  . GFR calc Af Amer 03/29/2017 >60  >60 mL/min Final   Comment: (NOTE) The eGFR has been calculated using the CKD EPI equation. This calculation has not been validated in all clinical situations. eGFR's persistently <60 mL/min signify possible Chronic Kidney Disease.   Georgiann Hahn gap 03/29/2017 7  5 - 15 Final   Performed at [facility], [address] [place], [place] [address]  . WBC 03/29/2017 4.0  3.8 - 10.6 K/uL Final  . RBC 03/29/2017 3.94* 4.40 - 5.90 MIL/uL Final  . Hemoglobin 03/29/2017 12.0* 13.0 - 18.0 g/dL Final  . HCT 03/29/2017 35.5* 40.0 - 52.0 % Final  . MCV 03/29/2017 90.1  80.0 - 100.0 fL Final  . MCH 03/29/2017 30.5  26.0 - 34.0 pg Final  . MCHC 03/29/2017 33.9  32.0 - 36.0 g/dL Final  . RDW 03/29/2017 15.8* 11.5 - 14.5 % Final  . Platelets 03/29/2017 158  150 - 440 K/uL Final  . Neutrophils Relative % 03/29/2017 58  % Final  . Neutro Abs 03/29/2017 2.4  1.4 - 6.5 K/uL Final  . Lymphocytes Relative 03/29/2017 30  % Final  . Lymphs Abs 03/29/2017 1.2  1.0 - 3.6 K/uL Final  . Monocytes Relative 03/29/2017 8  % Final  . Monocytes Absolute 03/29/2017 0.3  0.2 - 1.0 K/uL Final  . Eosinophils Relative 03/29/2017 3  % Final  . Eosinophils Absolute 03/29/2017 0.1  0 - 0.7 K/uL Final  . Basophils Relative 03/29/2017 1  % Final  . Basophils Absolute 03/29/2017 0.0  0 - 0.1 K/uL Final   Performed at [facility], [address] [place], [place] [address]  . Ferritin 03/29/2017 21* 24 - 336 ng/mL Final   Performed at [facility], [address] [place], [place] [address]    Assessment:  Donald Mcdowell. is a 72 y.o. male with stage IIIB  transverse colon cancer s/p right partial colectomy on 12/28/2016.  Pathology revealed a 5.5 cm grade II invasive adenocarcinoma of the transverse colon, extending through the muscularis propria and into the adjacent adipose tissue, including the greater omentum. There was metastatic adenocarcinoma in multiple lymph nodes and tumor deposits, at least 5 involved with a total of at least 18 lymph nodes (5 of 18).  Pathologic stage was T3N2a.  CEA was 1.4 on 11/09/2016.  Abdomen and pelvic CT on 11/09/2016 revealed mid transverse colon segment of luminal narrowing with wall thickening compatible with colon cancer.  There was a single enlarged adjacent mesenteric lymph node and prominent subcentimeter lymph nodes anterior to the pancreas which may be metastatic.  There was no evidence of distant metastasis.  There was a left kidney lower pole complex cyst with thin internal septations (Bosniak IIF).  His prostate was enlarged and extended into the floor of bladder.  There was right kidney lower pole punctate nonobstructing nephrolithiasis.  Chest CT on 11/18/2016 revealed no metastatic disease.  There was abrupt termination of the left upper lobe beyond which the distal bronchus appeared dilated with mild branching into the lung parenchyma.  Imaging suggested a bronchocele in the medial aspect of the left upper lobe. Differential included an endobronchial polyp, endobronchial tumor, bronchial stricture or aspirated foreign body.   Electromagnetic navigational bronchoscopy (ENB) on 12/16/2016 revealed non-small cell carcinoma, favor squamous cell lung carcinoma.  Tumor was positive for p40 and negative for cdx-2 and TTF-1.  He received SBRT to the left upper lobe of 5000 cGy in 5 fractions from 02/16/2017 - 03/02/2017.  PET scan on 01/26/2017 revealed a 2.4 x 0.9 cm hypermetabolic medial left upper lobe pulmonary nodule, corresponding to known primary bronchogenic neoplasm. No findings suspicious for metastatic  disease.  EGD on 11/05/2016 revealed gastritis.  Pathology confirmed chronic mild gastritis and features of a healing erosion in the stomach.  There was no H pylori, dysplasia or malignancy.  Echocardiogram  On 12/15/2016 revealed mild to moderate aortic valve stenosis. There was abnormal LV relaxation consistent with grade I diastolic dysfunction. Estimated EF was 55-60%.  He is s/p cycle #1 FOLFOX chemotherapy (03/15/2017).  He has tolerated chemotherapy well.  He has iron deficiency anemia.  He is on oral iron.  Ferritin was 6 on 09/04/2016.  Hematocrit was 29.7 and hemoglobin 8.8 on 09/04/2016.  Hematocrit was 28.1, hemoglobin 9.3, and MCV 87.9 on 11/09/2016.   Symptomatically, he is doing well. He denies pain.  Exam is normal.  WBC is 4000 with an ANC of 2400. Hemoglobin is 12.0, hematocrit 35.5, and platelets 158,000.  Plan: 1.  Labs today: CBC with diff, CMP, Mg 2.  Blood counts stable and adequate for treatment. Proceed with cycle #2 FOLFOX chemotherapy. 3.  RTC in 2 days for pump disconnect. 4.  RTC in 1 week for labs (CBC with diff). 5.  RTC in 2 weeks for MD assessment, labs (CBC with diff, CMP, Mg, ferritin), and cycle #3 FOLFOX chemotherapy.   [name], NP  03/29/2017, 9:19 AM   I saw and evaluated the patient, participating in the key portions of the service and reviewing pertinent diagnostic studies and records.  I reviewed the nurse practitioner's note and agree with the findings and the plan.  The assessment and plan were discussed with the patient.  Several questions were asked by the patient and answered.   Nolon Stalls, MD 03/29/2017,9:19 AM

## 2017-03-30 ENCOUNTER — Other Ambulatory Visit: Payer: Self-pay | Admitting: *Deleted

## 2017-03-30 DIAGNOSIS — C184 Malignant neoplasm of transverse colon: Secondary | ICD-10-CM

## 2017-03-31 ENCOUNTER — Other Ambulatory Visit: Payer: Self-pay

## 2017-03-31 ENCOUNTER — Encounter: Payer: Self-pay | Admitting: Radiation Oncology

## 2017-03-31 ENCOUNTER — Ambulatory Visit
Admission: RE | Admit: 2017-03-31 | Discharge: 2017-03-31 | Disposition: A | Discharge status: Home / Self Care | Payer: Medicare Other | Source: Ambulatory Visit | Attending: Radiation Oncology | Admitting: Radiation Oncology

## 2017-03-31 ENCOUNTER — Other Ambulatory Visit: Payer: Self-pay | Admitting: *Deleted

## 2017-03-31 ENCOUNTER — Inpatient Hospital Stay: Payer: Medicare Other

## 2017-03-31 VITALS — BP 118/61 | HR 93 | Temp 98.7°F | Resp 18

## 2017-03-31 VITALS — BP 126/62 | HR 93 | Temp 97.3°F | Resp 20 | Wt 274.1 lb

## 2017-03-31 DIAGNOSIS — C3411 Malignant neoplasm of upper lobe, right bronchus or lung: Secondary | ICD-10-CM | POA: Diagnosis not present

## 2017-03-31 DIAGNOSIS — C3412 Malignant neoplasm of upper lobe, left bronchus or lung: Secondary | ICD-10-CM

## 2017-03-31 DIAGNOSIS — C184 Malignant neoplasm of transverse colon: Secondary | ICD-10-CM | POA: Insufficient documentation

## 2017-03-31 DIAGNOSIS — Z9221 Personal history of antineoplastic chemotherapy: Secondary | ICD-10-CM | POA: Diagnosis not present

## 2017-03-31 DIAGNOSIS — Z87891 Personal history of nicotine dependence: Secondary | ICD-10-CM | POA: Diagnosis not present

## 2017-03-31 DIAGNOSIS — Z923 Personal history of irradiation: Secondary | ICD-10-CM | POA: Diagnosis not present

## 2017-03-31 MED ORDER — SODIUM CHLORIDE 0.9% FLUSH
10.0000 mL | INTRAVENOUS | Status: DC | PRN
  Administered 2017-03-31: 10 mL
  Filled 2017-03-31: qty 10

## 2017-03-31 MED ORDER — HEPARIN SOD (PORK) LOCK FLUSH 100 UNIT/ML IV SOLN
500.0000 [IU] | Freq: Once | INTRAVENOUS | Status: AC | PRN
  Administered 2017-03-31: 500 [IU]
  Filled 2017-03-31: qty 5

## 2017-03-31 NOTE — Progress Notes (Signed)
Radiation Oncology Follow up Note  Name: Donald Mcdowell.   Date:   03/31/2017 MRN:  [id number] DOB: 1945-08-10    This 72 y.o. male presents to the clinic today for one-month follow-up status post SB RT to his left upper lobe for stage I squamous cell carcinoma in a patient with locally advanced rectal cancer.Marland Kitchen  REFERRING PROVIDER: Arnetha Courser, MD  HPI: Patient is a 72 year old male now one month out having completed SB RT to his left upper lobe for stage I squamous cell carcinoma. Patient also has known locally advanced rectal cancer. He is seen today in routine follow-up and is doing well from a pulmonary standpoint he does have a clear slightly productive cough no hemoptysis chest tightness or increased dyspnea on exertion.Marland Kitchen He currently is on cycle 2 of FOLFOX chemotherapy for stage IIIB colon cancer.  COMPLICATIONS OF TREATMENT: none  FOLLOW UP COMPLIANCE: keeps appointments   PHYSICAL EXAM:  BP 126/62   Pulse 93   Temp (!) 97.3 F (36.3 C)   Resp 20   Wt 274 lb 2.3 oz (124.3 kg)   BMI 38.24 kg/m  Patient has a continuous infusion pump present working well. Well-developed well-nourished patient in NAD. HEENT reveals PERLA, EOMI, discs not visualized.  Oral cavity is clear. No oral mucosal lesions are identified. Neck is clear without evidence of cervical or supraclavicular adenopathy. Lungs are clear to A&P. Cardiac examination is essentially unremarkable with regular rate and rhythm without murmur rub or thrill. Abdomen is benign with no organomegaly or masses noted. Motor sensory and DTR levels are equal and symmetric in the upper and lower extremities. Cranial nerves II through XII are grossly intact. Proprioception is intact. No peripheral adenopathy or edema is identified. No motor or sensory levels are noted. Crude visual fields are within normal range.  RADIOLOGY RESULTS: No current films for review I have ordered a CT scan of the chest in about 2-1/2 months  PLAN:  Present time from a pulmonary standpoint patient is doing well with no side effects or complaints. I've ordered a 3 month follow-up with a repeat CT scan of the chest prior to that visit. Will cancel that CT scan if one has been done in reference to his ongoing chemotherapy for locally advanced transverse colon adenocarcinoma. Patient otherwise is doing well. He knows to call with any concerns.  I would like to take this opportunity to thank you for allowing me to participate in the care of your patient.Noreene Filbert, MD

## 2017-04-01 ENCOUNTER — Other Ambulatory Visit: Payer: Self-pay | Admitting: Family Medicine

## 2017-04-01 NOTE — Telephone Encounter (Signed)
Rx's approved.

## 2017-04-04 ENCOUNTER — Encounter: Payer: Self-pay | Admitting: Hematology and Oncology

## 2017-04-05 ENCOUNTER — Inpatient Hospital Stay: Payer: Medicare Other

## 2017-04-05 DIAGNOSIS — C184 Malignant neoplasm of transverse colon: Secondary | ICD-10-CM

## 2017-04-05 LAB — CBC WITH DIFFERENTIAL/PLATELET
Basophils Absolute: 0 10*3/uL (ref 0–0.1)
Basophils Relative: 1 %
Eosinophils Absolute: 0.1 10*3/uL (ref 0–0.7)
Eosinophils Relative: 4 %
HCT: 37.5 % — ABNORMAL LOW (ref 40.0–52.0)
Hemoglobin: 12.4 g/dL — ABNORMAL LOW (ref 13.0–18.0)
Lymphocytes Relative: 40 %
Lymphs Abs: 1.5 10*3/uL (ref 1.0–3.6)
MCH: 30.2 pg (ref 26.0–34.0)
MCHC: 33 g/dL (ref 32.0–36.0)
MCV: 91.3 fL (ref 80.0–100.0)
Monocytes Absolute: 0.4 10*3/uL (ref 0.2–1.0)
Monocytes Relative: 10 %
Neutro Abs: 1.8 10*3/uL (ref 1.4–6.5)
Neutrophils Relative %: 47 %
Platelets: 182 10*3/uL (ref 150–440)
RBC: 4.1 MIL/uL — ABNORMAL LOW (ref 4.40–5.90)
RDW: 15.6 % — ABNORMAL HIGH (ref 11.5–14.5)
WBC: 3.9 10*3/uL (ref 3.8–10.6)

## 2017-04-09 ENCOUNTER — Other Ambulatory Visit: Payer: Self-pay | Admitting: *Deleted

## 2017-04-09 DIAGNOSIS — D124 Benign neoplasm of descending colon: Secondary | ICD-10-CM

## 2017-04-12 ENCOUNTER — Other Ambulatory Visit: Payer: Self-pay

## 2017-04-12 ENCOUNTER — Inpatient Hospital Stay: Payer: Medicare Other

## 2017-04-12 ENCOUNTER — Inpatient Hospital Stay (HOSPITAL_BASED_OUTPATIENT_CLINIC_OR_DEPARTMENT_OTHER): Payer: Medicare Other | Admitting: Hematology and Oncology

## 2017-04-12 ENCOUNTER — Encounter: Payer: Self-pay | Admitting: Hematology and Oncology

## 2017-04-12 VITALS — BP 150/72 | HR 97 | Temp 96.8°F | Wt 275.2 lb

## 2017-04-12 DIAGNOSIS — C184 Malignant neoplasm of transverse colon: Secondary | ICD-10-CM | POA: Diagnosis not present

## 2017-04-12 DIAGNOSIS — D509 Iron deficiency anemia, unspecified: Secondary | ICD-10-CM | POA: Diagnosis not present

## 2017-04-12 DIAGNOSIS — D124 Benign neoplasm of descending colon: Secondary | ICD-10-CM

## 2017-04-12 DIAGNOSIS — C3412 Malignant neoplasm of upper lobe, left bronchus or lung: Secondary | ICD-10-CM | POA: Diagnosis not present

## 2017-04-12 DIAGNOSIS — Z5111 Encounter for antineoplastic chemotherapy: Secondary | ICD-10-CM

## 2017-04-12 LAB — COMPREHENSIVE METABOLIC PANEL
ALT: 25 U/L (ref 17–63)
AST: 27 U/L (ref 15–41)
Albumin: 3.8 g/dL (ref 3.5–5.0)
Alkaline Phosphatase: 48 U/L (ref 38–126)
Anion gap: 9 (ref 5–15)
BUN: 18 mg/dL (ref 6–20)
CO2: 23 mmol/L (ref 22–32)
Calcium: 9.2 mg/dL (ref 8.9–10.3)
Chloride: 106 mmol/L (ref 101–111)
Creatinine, Ser: 1.16 mg/dL (ref 0.61–1.24)
GFR calc Af Amer: >60 mL/min (ref >60)
GFR calc non Af Amer: >60 mL/min (ref >60)
Glucose, Bld: 213 mg/dL — ABNORMAL HIGH (ref 65–99)
Potassium: 4.3 mmol/L (ref 3.5–5.1)
Sodium: 138 mmol/L (ref 135–145)
Total Bilirubin: 0.4 mg/dL (ref 0.3–1.2)
Total Protein: 6.8 g/dL (ref 6.5–8.1)

## 2017-04-12 LAB — CBC WITH DIFFERENTIAL/PLATELET
Basophils Absolute: 0 10*3/uL (ref 0–0.1)
Basophils Relative: 1 %
Eosinophils Absolute: 0.1 10*3/uL (ref 0–0.7)
Eosinophils Relative: 2 %
HCT: 37.4 % — ABNORMAL LOW (ref 40.0–52.0)
Hemoglobin: 12.2 g/dL — ABNORMAL LOW (ref 13.0–18.0)
Lymphocytes Relative: 26 %
Lymphs Abs: 1.2 10*3/uL (ref 1.0–3.6)
MCH: 29.9 pg (ref 26.0–34.0)
MCHC: 32.6 g/dL (ref 32.0–36.0)
MCV: 91.6 fL (ref 80.0–100.0)
Monocytes Absolute: 0.4 10*3/uL (ref 0.2–1.0)
Monocytes Relative: 8 %
Neutro Abs: 2.9 10*3/uL (ref 1.4–6.5)
Neutrophils Relative %: 63 %
Platelets: 128 10*3/uL — ABNORMAL LOW (ref 150–440)
RBC: 4.08 MIL/uL — ABNORMAL LOW (ref 4.40–5.90)
RDW: 16.8 % — ABNORMAL HIGH (ref 11.5–14.5)
WBC: 4.5 10*3/uL (ref 3.8–10.6)

## 2017-04-12 LAB — FERRITIN: Ferritin: 20 ng/mL — ABNORMAL LOW (ref 24–336)

## 2017-04-12 LAB — MAGNESIUM: Magnesium: 1.8 mg/dL (ref 1.7–2.4)

## 2017-04-12 MED ORDER — DEXTROSE 5 % IV SOLN
Freq: Once | INTRAVENOUS | Status: AC
  Administered 2017-04-12: 10:00:00 via INTRAVENOUS
  Filled 2017-04-12: qty 1000

## 2017-04-12 MED ORDER — LEUCOVORIN CALCIUM INJECTION 350 MG
1000.0000 mg | Freq: Once | INTRAMUSCULAR | Status: AC
  Administered 2017-04-12: 1000 mg via INTRAVENOUS
  Filled 2017-04-12: qty 50

## 2017-04-12 MED ORDER — HEPARIN SOD (PORK) LOCK FLUSH 100 UNIT/ML IV SOLN: 500.0000 [IU] | Freq: Once | INTRAVENOUS | Status: DC

## 2017-04-12 MED ORDER — FLUOROURACIL CHEMO INJECTION 5 GM/100ML
2400.0000 mg/m2 | INTRAVENOUS | Status: DC
  Administered 2017-04-12: 5950 mg via INTRAVENOUS
  Filled 2017-04-12: qty 119

## 2017-04-12 MED ORDER — FLUOROURACIL CHEMO INJECTION 2.5 GM/50ML
400.0000 mg/m2 | Freq: Once | INTRAVENOUS | Status: AC
  Administered 2017-04-12: 1000 mg via INTRAVENOUS
  Filled 2017-04-12: qty 20

## 2017-04-12 MED ORDER — PALONOSETRON HCL INJECTION 0.25 MG/5ML
0.2500 mg | Freq: Once | INTRAVENOUS | Status: AC
  Administered 2017-04-12: 0.25 mg via INTRAVENOUS
  Filled 2017-04-12: qty 5

## 2017-04-12 MED ORDER — HEPARIN SOD (PORK) LOCK FLUSH 100 UNIT/ML IV SOLN: 500.0000 [IU] | Freq: Once | INTRAVENOUS | Status: DC | PRN

## 2017-04-12 MED ORDER — OXALIPLATIN CHEMO INJECTION 100 MG/20ML
85.0000 mg/m2 | Freq: Once | INTRAVENOUS | Status: AC
  Administered 2017-04-12: 210 mg via INTRAVENOUS
  Filled 2017-04-12: qty 42

## 2017-04-12 MED ORDER — SODIUM CHLORIDE 0.9% FLUSH
10.0000 mL | INTRAVENOUS | Status: DC | PRN
  Filled 2017-04-12: qty 10

## 2017-04-12 MED ORDER — SODIUM CHLORIDE 0.9% FLUSH
10.0000 mL | INTRAVENOUS | Status: DC | PRN
  Administered 2017-04-12: 10 mL via INTRAVENOUS
  Filled 2017-04-12: qty 10

## 2017-04-12 MED ORDER — DEXAMETHASONE SODIUM PHOSPHATE 10 MG/ML IJ SOLN
10.0000 mg | Freq: Once | INTRAMUSCULAR | Status: AC
  Administered 2017-04-12: 10 mg via INTRAVENOUS
  Filled 2017-04-12: qty 1

## 2017-04-12 NOTE — Progress Notes (Signed)
[facility] day:  04/12/2017  Chief Complaint: Donald [place]. is a 72 y.o. male with stage IIIB colon cancer who is seen for assessment prior to cycle #3 FOLFOX chemotherapy.  HPI:   The patient was last seen in the medical oncology clinic on 03/29/2017.  At that time, he was doing well. He denied pain.  Exam was normal.  WBC was 4000 with an ANC of 2400. Hemoglobin was 12.0, hematocrit 35.5, and platelets 158,000.  He received cycle #2 FOLFOX.  During the interim, he has done "alright I reckon".  He notes cold sensitivity lasts for 4-5 days.  He denies any diarrhea or mouth sores.  He has a follow-up chest CT scheduled by Dr. Oren Bracket on 07/09/2017.   Past Medical History:  Diagnosis Date  . Abdominal aortic atherosclerosis ([place]) 09/15/2016   CT scan June 2018  . Allergy   . Cancer ([place])   . Coronary artery calcification seen on CAT scan 09/15/2016   Previous smoker; noted on CT scan June 2018  . Diabetes mellitus without complication ([place])   . GERD (gastroesophageal reflux disease)   . Gout   . Hyperlipidemia   . Hypertension   . Kidney function abnormal    stage 2  . Kidney stones   . Obesity 08/20/2015    Past Surgical History:  Procedure Laterality Date  . COLONOSCOPY WITH PROPOFOL N/A 11/05/2016   Procedure: COLONOSCOPY WITH PROPOFOL;  Surgeon: Lucilla Lame, MD;  Location: [facility];  Service: Gastroenterology;  Laterality: N/A;  . ELECTROMAGNETIC NAVIGATION BROCHOSCOPY N/A 12/16/2016   Procedure: ELECTROMAGNETIC NAVIGATION BRONCHOSCOPY;  Surgeon: Flora Lipps, MD;  Location: [facility];  Service: Cardiopulmonary;  Laterality: N/A;  . ESOPHAGOGASTRODUODENOSCOPY (EGD) WITH PROPOFOL N/A 11/05/2016   Procedure: ESOPHAGOGASTRODUODENOSCOPY (EGD) WITH PROPOFOL;  Surgeon: Lucilla Lame, MD;  Location: [facility];  Service: Gastroenterology;  Laterality: N/A;  Diabetic - oral meds  . kidney stones    . PARTIAL COLECTOMY N/A  12/28/2016   Procedure: PARTIAL COLECTOMY RIGHT;  Surgeon: Jules Husbands, MD;  Location: [facility];  Service: General;  Laterality: N/A;  . POLYPECTOMY N/A 11/05/2016   Procedure: POLYPECTOMY;  Surgeon: Lucilla Lame, MD;  Location: [facility];  Service: Gastroenterology;  Laterality: N/A;  . PORTACATH PLACEMENT Right 01/18/2017   Procedure: INSERTION PORT-A-CATH;  Surgeon: Jules Husbands, MD;  Location: [facility];  Service: General;  Laterality: Right;    Family History  Problem Relation Age of Onset  . Alzheimer's disease Mother   . Heart disease Father   . Diabetes Father   . Cancer Father        Prostate  . Cancer Sister        skin cancer    Social History:  reports that he quit smoking about 10 months ago. His smoking use included cigarettes. he has never used smokeless tobacco. He reports that he does not drink alcohol or use drugs.  Patient worked for a [employer].  He retired in 1996.  He was in his 60s. Patient was a 1-2 pack per day smoker since he was a child.  He stopped smoking in 07/2016.  He drank alcohol in the past.  He has 3 living sisters (1 sister died).  He has no children. Patient married to [place].  Communicate with neighbor [name]  [phone]. The patient is alone today.    Allergies: No Known Allergies  Current Medications: Current Outpatient Medications  Medication Sig Dispense Refill  . allopurinol (ZYLOPRIM) 300 MG tablet Take 1 tablet (300 mg total) by mouth daily. 90 tablet 3  . Cyanocobalamin (VITAMIN B-12 PO) Take 1 tablet by mouth daily.    Marland Kitchen GLIPIZIDE XL 10 MG 24 hr tablet TAKE 1 TABLET BY MOUTH TWICE DAILY 180 tablet 1  . glucose blood (ACCU-CHEK COMPACT PLUS) test strip Check blood sugars once a day if desired; LON 99 months; Dx E11.21 102 each 1  . glucose blood (ACCU-CHEK COMPACT STRIPS) test strip 100 each by Other route as needed for other. Use as instructed, check FSBS once a day; LON 99 months, E11.9 100 each 1  .  lidocaine-prilocaine (EMLA) cream Apply to affected area once 30 g 3  . Magnesium Oxide 250 MG TABS Take 250 mg by mouth every morning.     . metFORMIN (GLUCOPHAGE) 1000 MG tablet Take 1 tablet (1,000 mg total) by mouth 2 (two) times daily with a meal. 180 tablet 1  . mupirocin ointment (BACTROBAN) 2 %   0  . pantoprazole (PROTONIX) 20 MG tablet Take 1 tablet (20 mg total) by mouth daily. 30 tablet 2  . quinapril (ACCUPRIL) 5 MG tablet TAKE 1 TABLET BY MOUTH ONCE DAILY 30 tablet 5  . rosuvastatin (CRESTOR) 10 MG tablet Take 1 tablet (10 mg total) by mouth at bedtime. 90 tablet 1  . saw palmetto 160 MG capsule Take 160 mg 2 (two) times daily by mouth.    . tamsulosin (FLOMAX) 0.4 MG CAPS capsule Take 0.4 mg by mouth daily.     No current facility-administered medications for this visit.     Review of Systems:  GENERAL:  Feels "alright".  No fevers or sweats.  Intentional weight loss (272 pounds in 04/2016). Weight up 3 pounds.  PERFORMANCE STATUS (ECOG):  1 HEENT:  No visual changes, runny nose, sore throat, mouth sores or tenderness. Lungs: No shortness of breath or cough.  No hemoptysis. Cardiac: No chest pain, palpitations, orthopnea, or PND. EF 55-60%. GI:  No nausea, vomiting, constipation, or melena. GU:  No urgency, frequency, dysuria, or hematuria. Musculoskeletal:  No back pain.  No joint pain.  No muscle tenderness. Extremities:  No pain or swelling. Skin:  No rashes or skin changes. Neuro:  Cold neuropathy (see HPI).  No headache, numbness or weakness, balance or coordination issues. Endocrine:  Diabetes.  No thyroid issues, hot flashes or night sweats. Psych:  No mood changes, depression or anxiety. Pain:  No focal pain. Review of systems:  All other systems reviewed and found to be negative.  Physical Exam: Blood pressure (!) 150/72, pulse 97, temperature (!) 96.8 F (36 C), temperature source Tympanic, weight 275 lb 3.2 oz (124.8 kg). GENERAL:  Well developed, well  nourished, heavyset gentleman sitting comfortably in the exam room in no acute distress. MENTAL STATUS:  Alert and oriented to person, place and time. HEAD:  Wearing a black cap.  Thin gray hair.  Male pattern baldness.  Normocephalic, atraumatic, face symmetric, no Cushingoid features. EYES:  Glasses.  Blue eyes.  Pupils equal round and reactive to light and accomodation.  No conjunctivitis or scleral icterus. ENT:  Oropharynx clear without lesion.  Tongue normal. Mucous membranes moist.  RESPIRATORY:  Clear to auscultation without rales, wheezes or rhonchi. CARDIOVASCULAR:  Regular rate and rhythm without murmur, rub or gallop. CHEST WALL:  Port-a-cath in place.  ABDOMEN:  Fully round.  Soft, non-tender, with active bowel sounds, and no appreciable hepatosplenomegaly.  No  masses. SKIN:  No rashes, ulcers or lesions. EXTREMITIES: No edema, no skin discoloration or tenderness.  No palpable cords. LYMPH NODES: No palpable cervical, supraclavicular, axillary or inguinal adenopathy  NEUROLOGICAL: Unremarkable. PSYCH:  Appropriate.   Infusion on 04/12/2017  Component Date Value Ref Range Status  . Ferritin 04/12/2017 20* 24 - 336 ng/mL Final   Performed at [facility], [address] [place], [place] [address]  . Magnesium 04/12/2017 1.8  1.7 - 2.4 mg/dL Final   Performed at [facility], [address], [place], [place] [address]  . Sodium 04/12/2017 138  135 - 145 mmol/L Final  . Potassium 04/12/2017 4.3  3.5 - 5.1 mmol/L Final  . Chloride 04/12/2017 106  101 - 111 mmol/L Final  . CO2 04/12/2017 23  22 - 32 mmol/L Final  . Glucose, Bld 04/12/2017 213* 65 - 99 mg/dL Final  . BUN 04/12/2017 18  6 - 20 mg/dL Final  . Creatinine, Ser 04/12/2017 1.16  0.61 - 1.24 mg/dL Final  . Calcium 04/12/2017 9.2  8.9 - 10.3 mg/dL Final  . Total Protein 04/12/2017 6.8  6.5 - 8.1 g/dL Final  . Albumin 04/12/2017 3.8  3.5 - 5.0 g/dL Final  . AST 04/12/2017 27  15 - 41 U/L Final  . ALT  04/12/2017 25  17 - 63 U/L Final  . Alkaline Phosphatase 04/12/2017 48  38 - 126 U/L Final  . Total Bilirubin 04/12/2017 0.4  0.3 - 1.2 mg/dL Final  . GFR calc non Af Amer 04/12/2017 >60  >60 mL/min Final  . GFR calc Af Amer 04/12/2017 >60  >60 mL/min Final   Comment: (NOTE) The eGFR has been calculated using the CKD EPI equation. This calculation has not been validated in all clinical situations. eGFR's persistently <60 mL/min signify possible Chronic Kidney Disease.   Georgiann Hahn gap 04/12/2017 9  5 - 15 Final   Performed at [facility], [address] [place],  [address]  . WBC 04/12/2017 4.5  3.8 - 10.6 K/uL Final  . RBC 04/12/2017 4.08* 4.40 - 5.90 MIL/uL Final  . Hemoglobin 04/12/2017 12.2* 13.0 - 18.0 g/dL Final  . HCT 04/12/2017 37.4* 40.0 - 52.0 % Final  . MCV 04/12/2017 91.6  80.0 - 100.0 fL Final  . MCH 04/12/2017 29.9  26.0 - 34.0 pg Final  . MCHC 04/12/2017 32.6  32.0 - 36.0 g/dL Final  . RDW 04/12/2017 16.8* 11.5 - 14.5 % Final  . Platelets 04/12/2017 128* 150 - 440 K/uL Final  . Neutrophils Relative % 04/12/2017 63  % Final  . Neutro Abs 04/12/2017 2.9  1.4 - 6.5 K/uL Final  . Lymphocytes Relative 04/12/2017 26  % Final  . Lymphs Abs 04/12/2017 1.2  1.0 - 3.6 K/uL Final  . Monocytes Relative 04/12/2017 8  % Final  . Monocytes Absolute 04/12/2017 0.4  0.2 - 1.0 K/uL Final  . Eosinophils Relative 04/12/2017 2  % Final  . Eosinophils Absolute 04/12/2017 0.1  0 - 0.7 K/uL Final  . Basophils Relative 04/12/2017 1  % Final  . Basophils Absolute 04/12/2017 0.0  0 - 0.1 K/uL Final   Performed at [facility], [address] [place],  [address]    Assessment:  Donald Mcdowell. is a 72 y.o. male with stage IIIB transverse colon cancer s/p right partial colectomy on 12/28/2016.  Pathology revealed a 5.5 cm grade II invasive adenocarcinoma of the transverse colon, extending through the muscularis propria and  into the adjacent adipose tissue,  including the greater omentum. There was metastatic adenocarcinoma in multiple lymph nodes and tumor deposits, at least 5 involved with a total of at least 18 lymph nodes (5 of 18).  Pathologic stage was T3N2a.  CEA was 1.4 on 11/09/2016.  Abdomen and pelvic CT on 11/09/2016 revealed mid transverse colon segment of luminal narrowing with wall thickening compatible with colon cancer.  There was a single enlarged adjacent mesenteric lymph node and prominent subcentimeter lymph nodes anterior to the pancreas which may be metastatic.  There was no evidence of distant metastasis.  There was a left kidney lower pole complex cyst with thin internal septations (Bosniak IIF).  His prostate was enlarged and extended into the floor of bladder.  There was right kidney lower pole punctate nonobstructing nephrolithiasis.  Chest CT on 11/18/2016 revealed no metastatic disease.  There was abrupt termination of the left upper lobe beyond which the distal bronchus appeared dilated with mild branching into the lung parenchyma.  Imaging suggested a bronchocele in the medial aspect of the left upper lobe. Differential included an endobronchial polyp, endobronchial tumor, bronchial stricture or aspirated foreign body.   Electromagnetic navigational bronchoscopy (ENB) on 12/16/2016 revealed non-small cell carcinoma, favor squamous cell lung carcinoma.  Tumor was positive for p40 and negative for cdx-2 and TTF-1.  He received SBRT to the left upper lobe of 5000 cGy in 5 fractions from 02/16/2017 - 03/02/2017.  PET scan on 01/26/2017 revealed a 2.4 x 0.9 cm hypermetabolic medial left upper lobe pulmonary nodule, corresponding to known primary bronchogenic neoplasm. No findings suspicious for metastatic disease.  EGD on 11/05/2016 revealed gastritis.  Pathology confirmed chronic mild gastritis and features of a healing erosion in the stomach.  There was no H pylori, dysplasia or malignancy.  Echocardiogram  On 12/15/2016 revealed  mild to moderate aortic valve stenosis. There was abnormal LV relaxation consistent with grade I diastolic dysfunction. Estimated EF was 55-60%.  He is s/p 2 cycles of FOLFOX chemotherapy (03/15/2017 - 03/29/2017).  He has tolerated chemotherapy well.  He has iron deficiency anemia.  He is on oral iron.  Ferritin was 6 on 09/04/2016.  Hematocrit was 29.7 and hemoglobin 8.8 on 09/04/2016.  Hematocrit was 28.1, hemoglobin 9.3, and MCV 87.9 on 11/09/2016.   Ferritin has been followed: 28 on 10/13/2016, 15 on 01/14/2017, 21 on 03/29/2016, and 20 on 04/12/2017.  Symptomatically, he is doing well.  Exam is stable.  WBC is 4500 with an [place] of 2900. Hemoglobin is 12.2, hematocrit 37.4, and platelets 128,000.  Plan: 1.  Labs today: CBC with diff, CMP, Mg 2.  Cycle #3 FOLFOX chemotherapy. 3.  RTC in 2 days for pump disconnect. 4.  RTC in 2 weeks for MD assessment, labs (CBC with diff, CMP, Mg, ferritin), and cycle #4 FOLFOX chemotherapy.   Lequita Asal, MD  04/12/2017, 3:35 PM

## 2017-04-14 ENCOUNTER — Inpatient Hospital Stay: Payer: Medicare Other

## 2017-04-14 VITALS — BP 118/64 | HR 108 | Temp 97.0°F | Resp 19

## 2017-04-14 DIAGNOSIS — C184 Malignant neoplasm of transverse colon: Secondary | ICD-10-CM | POA: Diagnosis not present

## 2017-04-14 MED ORDER — HEPARIN SOD (PORK) LOCK FLUSH 100 UNIT/ML IV SOLN
500.0000 [IU] | Freq: Once | INTRAVENOUS | Status: AC | PRN
  Administered 2017-04-14: 500 [IU]
  Filled 2017-04-14: qty 5

## 2017-04-14 MED ORDER — SODIUM CHLORIDE 0.9% FLUSH
10.0000 mL | INTRAVENOUS | Status: DC | PRN
  Administered 2017-04-14: 10 mL
  Filled 2017-04-14: qty 10

## 2017-04-26 ENCOUNTER — Inpatient Hospital Stay (HOSPITAL_BASED_OUTPATIENT_CLINIC_OR_DEPARTMENT_OTHER): Payer: Medicare Other | Admitting: Hematology and Oncology

## 2017-04-26 ENCOUNTER — Encounter: Payer: Self-pay | Admitting: Hematology and Oncology

## 2017-04-26 ENCOUNTER — Telehealth: Payer: Self-pay | Admitting: *Deleted

## 2017-04-26 ENCOUNTER — Inpatient Hospital Stay: Payer: Medicare Other | Attending: Hematology and Oncology

## 2017-04-26 ENCOUNTER — Inpatient Hospital Stay: Payer: Medicare Other

## 2017-04-26 VITALS — BP 117/70 | HR 90 | Temp 96.7°F | Resp 18 | Wt 271.6 lb

## 2017-04-26 DIAGNOSIS — Z5111 Encounter for antineoplastic chemotherapy: Secondary | ICD-10-CM

## 2017-04-26 DIAGNOSIS — D509 Iron deficiency anemia, unspecified: Secondary | ICD-10-CM | POA: Diagnosis not present

## 2017-04-26 DIAGNOSIS — Z452 Encounter for adjustment and management of vascular access device: Secondary | ICD-10-CM | POA: Diagnosis not present

## 2017-04-26 DIAGNOSIS — C184 Malignant neoplasm of transverse colon: Secondary | ICD-10-CM | POA: Insufficient documentation

## 2017-04-26 DIAGNOSIS — C3412 Malignant neoplasm of upper lobe, left bronchus or lung: Secondary | ICD-10-CM

## 2017-04-26 DIAGNOSIS — G629 Polyneuropathy, unspecified: Secondary | ICD-10-CM | POA: Diagnosis not present

## 2017-04-26 DIAGNOSIS — Z7189 Other specified counseling: Secondary | ICD-10-CM

## 2017-04-26 DIAGNOSIS — D500 Iron deficiency anemia secondary to blood loss (chronic): Secondary | ICD-10-CM

## 2017-04-26 LAB — COMPREHENSIVE METABOLIC PANEL
ALT: 29 U/L (ref 17–63)
AST: 32 U/L (ref 15–41)
Albumin: 3.8 g/dL (ref 3.5–5.0)
Alkaline Phosphatase: 52 U/L (ref 38–126)
Anion gap: 5 (ref 5–15)
BUN: 20 mg/dL (ref 6–20)
CO2: 23 mmol/L (ref 22–32)
Calcium: 9.1 mg/dL (ref 8.9–10.3)
Chloride: 109 mmol/L (ref 101–111)
Creatinine, Ser: 1.2 mg/dL (ref 0.61–1.24)
GFR calc Af Amer: >60 mL/min (ref >60)
GFR calc non Af Amer: 60 mL/min — ABNORMAL LOW (ref >60)
Glucose, Bld: 254 mg/dL — ABNORMAL HIGH (ref 65–99)
Potassium: 4.3 mmol/L (ref 3.5–5.1)
Sodium: 137 mmol/L (ref 135–145)
Total Bilirubin: 0.6 mg/dL (ref 0.3–1.2)
Total Protein: 6.9 g/dL (ref 6.5–8.1)

## 2017-04-26 LAB — CBC WITH DIFFERENTIAL/PLATELET
Basophils Absolute: 0 10*3/uL (ref 0–0.1)
Basophils Relative: 1 %
Eosinophils Absolute: 0.1 10*3/uL (ref 0–0.7)
Eosinophils Relative: 2 %
HCT: 35.8 % — ABNORMAL LOW (ref 40.0–52.0)
Hemoglobin: 12 g/dL — ABNORMAL LOW (ref 13.0–18.0)
Lymphocytes Relative: 26 %
Lymphs Abs: 1.2 10*3/uL (ref 1.0–3.6)
MCH: 30.6 pg (ref 26.0–34.0)
MCHC: 33.4 g/dL (ref 32.0–36.0)
MCV: 91.6 fL (ref 80.0–100.0)
Monocytes Absolute: 0.3 10*3/uL (ref 0.2–1.0)
Monocytes Relative: 8 %
Neutro Abs: 2.9 10*3/uL (ref 1.4–6.5)
Neutrophils Relative %: 63 %
Platelets: 113 10*3/uL — ABNORMAL LOW (ref 150–440)
RBC: 3.91 MIL/uL — ABNORMAL LOW (ref 4.40–5.90)
RDW: 17.2 % — ABNORMAL HIGH (ref 11.5–14.5)
WBC: 4.5 10*3/uL (ref 3.8–10.6)

## 2017-04-26 LAB — FERRITIN: Ferritin: 24 ng/mL (ref 24–336)

## 2017-04-26 LAB — MAGNESIUM: Magnesium: 1.7 mg/dL (ref 1.7–2.4)

## 2017-04-26 MED ORDER — PALONOSETRON HCL INJECTION 0.25 MG/5ML
0.2500 mg | Freq: Once | INTRAVENOUS | Status: AC
  Administered 2017-04-26: 0.25 mg via INTRAVENOUS
  Filled 2017-04-26: qty 5

## 2017-04-26 MED ORDER — SODIUM CHLORIDE 0.9 % IV SOLN
2400.0000 mg/m2 | INTRAVENOUS | Status: DC
  Administered 2017-04-26: 5950 mg via INTRAVENOUS
  Filled 2017-04-26: qty 100

## 2017-04-26 MED ORDER — OXALIPLATIN CHEMO INJECTION 100 MG/20ML
85.0000 mg/m2 | Freq: Once | INTRAVENOUS | Status: AC
  Administered 2017-04-26: 210 mg via INTRAVENOUS
  Filled 2017-04-26: qty 40

## 2017-04-26 MED ORDER — SODIUM CHLORIDE 0.9% FLUSH
10.0000 mL | Freq: Once | INTRAVENOUS | Status: AC
  Administered 2017-04-26: 10 mL via INTRAVENOUS
  Filled 2017-04-26: qty 10

## 2017-04-26 MED ORDER — DEXTROSE 5 % IV SOLN
Freq: Once | INTRAVENOUS | Status: AC
  Administered 2017-04-26: 10:00:00 via INTRAVENOUS
  Filled 2017-04-26: qty 1000

## 2017-04-26 MED ORDER — DEXAMETHASONE SODIUM PHOSPHATE 10 MG/ML IJ SOLN
10.0000 mg | Freq: Once | INTRAMUSCULAR | Status: AC
  Administered 2017-04-26: 10 mg via INTRAVENOUS
  Filled 2017-04-26: qty 1

## 2017-04-26 MED ORDER — SODIUM CHLORIDE 0.9 % IV SOLN: 10.0000 mg | Freq: Once | INTRAVENOUS | Status: DC

## 2017-04-26 MED ORDER — FLUOROURACIL CHEMO INJECTION 2.5 GM/50ML
400.0000 mg/m2 | Freq: Once | INTRAVENOUS | Status: AC
  Administered 2017-04-26: 1000 mg via INTRAVENOUS
  Filled 2017-04-26: qty 20

## 2017-04-26 MED ORDER — LEUCOVORIN CALCIUM INJECTION 350 MG
1000.0000 mg | Freq: Once | INTRAVENOUS | Status: AC
  Administered 2017-04-26: 1000 mg via INTRAVENOUS
  Filled 2017-04-26: qty 35

## 2017-04-26 MED ORDER — HEPARIN SOD (PORK) LOCK FLUSH 100 UNIT/ML IV SOLN: 500.0000 [IU] | Freq: Once | INTRAVENOUS | Status: DC

## 2017-04-26 NOTE — Progress Notes (Signed)
[facility] day:  04/26/2017  Chief Complaint: Donald Mcdowell. is a 72 y.o. male with stage IIIB colon cancer who is seen for assessment prior to cycle #4 FOLFOX chemotherapy.  HPI:   The patient was last seen in the medical oncology clinic on 04/12/2017.  At that time, he was doing well.  Exam was stable.  WBC was 4500 with an ANC of 2900. Hemoglobin was  12.2, hematocrit 37.4, and platelets 128,000.  He received cycle #3 FOLFOX.  During the interim, patient has felt "pretty good". Patient denies nausea, vomiting, and changes in his bowel habits. His only complaint today is having dried blood in his nose. He denies cough, cold, and fevers.  Patient experiences cold sensitivity in digits 3, 4, and 5 on his RIGHT hand. Patient is eating well. His weight is down 4 pounds. He denies pain in the clinic today.    Past Medical History:  Diagnosis Date  . Abdominal aortic atherosclerosis ([place]) 09/15/2016   CT scan June 2018  . Allergy   . Cancer ([place])   . Coronary artery calcification seen on CAT scan 09/15/2016   Previous smoker; noted on CT scan June 2018  . Diabetes mellitus without complication ([place])   . GERD (gastroesophageal reflux disease)   . Gout   . Hyperlipidemia   . Hypertension   . Kidney function abnormal    stage 2  . Kidney stones   . Obesity 08/20/2015    Past Surgical History:  Procedure Laterality Date  . COLONOSCOPY WITH PROPOFOL N/A 11/05/2016   Procedure: COLONOSCOPY WITH PROPOFOL;  Surgeon: Lucilla Lame, MD;  Location: [facility];  Service: Gastroenterology;  Laterality: N/A;  . ELECTROMAGNETIC NAVIGATION BROCHOSCOPY N/A 12/16/2016   Procedure: ELECTROMAGNETIC NAVIGATION BRONCHOSCOPY;  Surgeon: Flora Lipps, MD;  Location: [facility];  Service: Cardiopulmonary;  Laterality: N/A;  . ESOPHAGOGASTRODUODENOSCOPY (EGD) WITH PROPOFOL N/A 11/05/2016   Procedure: ESOPHAGOGASTRODUODENOSCOPY (EGD) WITH PROPOFOL;  Surgeon:  Lucilla Lame, MD;  Location: [facility];  Service: Gastroenterology;  Laterality: N/A;  Diabetic - oral meds  . kidney stones    . PARTIAL COLECTOMY N/A 12/28/2016   Procedure: PARTIAL COLECTOMY RIGHT;  Surgeon: Jules Husbands, MD;  Location: [facility];  Service: General;  Laterality: N/A;  . POLYPECTOMY N/A 11/05/2016   Procedure: POLYPECTOMY;  Surgeon: Lucilla Lame, MD;  Location: [facility];  Service: Gastroenterology;  Laterality: N/A;  . PORTACATH PLACEMENT Right 01/18/2017   Procedure: INSERTION PORT-A-CATH;  Surgeon: Jules Husbands, MD;  Location: [facility];  Service: General;  Laterality: Right;    Family History  Problem Relation Age of Onset  . Alzheimer's disease Mother   . Heart disease Father   . Diabetes Father   . Cancer Father        Prostate  . Cancer Sister        skin cancer    Social History:  reports that he quit smoking about 10 months ago. His smoking use included cigarettes. he has never used smokeless tobacco. He reports that he does not drink alcohol or use drugs.  Patient worked for a [employer].  He retired in 1996.  He was in his 57s. Patient was a 1-2 pack per day smoker since he was a child.  He stopped smoking in 07/2016.  He drank alcohol in the past.  He has 3 living sisters (1 sister died).  He has no children. Patient married to [place].  Communicate with neighbor [name]  [phone]. The patient is alone today.    Allergies: No Known Allergies  Current Medications: Current Outpatient Medications  Medication Sig Dispense Refill  . allopurinol (ZYLOPRIM) 300 MG tablet Take 1 tablet (300 mg total) by mouth daily. 90 tablet 3  . Cyanocobalamin (VITAMIN B-12 PO) Take 1 tablet by mouth daily.    Marland Kitchen GLIPIZIDE XL 10 MG 24 hr tablet TAKE 1 TABLET BY MOUTH TWICE DAILY 180 tablet 1  . glucose blood (ACCU-CHEK COMPACT PLUS) test strip Check blood sugars once a day if desired; LON 99 months; Dx E11.21 102 each 1  . glucose blood  (ACCU-CHEK COMPACT STRIPS) test strip 100 each by Other route as needed for other. Use as instructed, check FSBS once a day; LON 99 months, E11.9 100 each 1  . lidocaine-prilocaine (EMLA) cream Apply to affected area once 30 g 3  . Magnesium Oxide 250 MG TABS Take 250 mg by mouth every morning.     . metFORMIN (GLUCOPHAGE) 1000 MG tablet Take 1 tablet (1,000 mg total) by mouth 2 (two) times daily with a meal. 180 tablet 1  . mupirocin ointment (BACTROBAN) 2 %   0  . pantoprazole (PROTONIX) 20 MG tablet Take 1 tablet (20 mg total) by mouth daily. 30 tablet 2  . quinapril (ACCUPRIL) 5 MG tablet TAKE 1 TABLET BY MOUTH ONCE DAILY 30 tablet 5  . rosuvastatin (CRESTOR) 10 MG tablet Take 1 tablet (10 mg total) by mouth at bedtime. 90 tablet 1  . saw palmetto 160 MG capsule Take 160 mg 2 (two) times daily by mouth.    . tamsulosin (FLOMAX) 0.4 MG CAPS capsule Take 0.4 mg by mouth daily.     No current facility-administered medications for this visit.    Facility-Administered Medications Ordered in Other Visits  Medication Dose Route Frequency Provider Last Rate Last Dose  . heparin lock flush 100 unit/mL  500 Units Intravenous Once Lequita Asal, MD        Review of Systems:  GENERAL:  Feels "pretty".  No fevers or sweats.  Weight down 4 pounds. Marland Kitchen  PERFORMANCE STATUS (ECOG):  1 HEENT:  No visual changes, runny nose, sore throat, mouth sores or tenderness.  Dried blood in nose. Lungs: No shortness of breath or cough.  No hemoptysis. Cardiac: No chest pain, palpitations, orthopnea, or PND. GI:  No nausea, vomiting, constipation, or melena. GU:  No urgency, frequency, dysuria, or hematuria. Musculoskeletal:  No back pain.  No joint pain.  No muscle tenderness. Extremities:  No pain or swelling. Skin:  No rashes or skin changes. Neuro:  Cold neuropathy (see HPI).  No headache, numbness or weakness, balance or coordination issues. Endocrine:  Diabetes.  No thyroid issues, hot flashes or night  sweats. Psych:  No mood changes, depression or anxiety. Pain:  No focal pain. Review of systems:  All other systems reviewed and found to be negative.  Physical Exam: Blood pressure 117/70, pulse 90, temperature (!) 96.7 F (35.9 C), temperature source Tympanic, resp. rate 18, weight 271 lb 9 oz (123.2 kg). GENERAL:  Well developed, well nourished, heavyset gentleman sitting comfortably in the exam room in no acute distress. MENTAL STATUS:  Alert and oriented to person, place and time. HEAD:  Wearing a black cap.  Thin gray hair.  Male pattern baldness.  Normocephalic, atraumatic, face symmetric, no Cushingoid features. EYES:  Glasses.  Blue eyes.  Pupils equal round and reactive to light and accomodation.  No conjunctivitis or scleral icterus. ENT:  Oropharynx clear without lesion.  Tongue normal. Mucous membranes moist.  RESPIRATORY:  Clear to auscultation without rales, wheezes or rhonchi. CARDIOVASCULAR:  Regular rate and rhythm without murmur, rub or gallop. ABDOMEN:  Fully round.  Soft, non-tender, with active bowel sounds, and no appreciable hepatosplenomegaly.  No masses. SKIN:  No rashes, ulcers or lesions. EXTREMITIES: No edema, no skin discoloration or tenderness.  No palpable cords. LYMPH NODES: No palpable cervical, supraclavicular, axillary or inguinal adenopathy  NEUROLOGICAL: Unremarkable. PSYCH:  Appropriate.   Infusion on 04/26/2017  Component Date Value Ref Range Status  . Magnesium 04/26/2017 1.7  1.7 - 2.4 mg/dL Final   Performed at [facility], [address], [place], [place] [address]  . Sodium 04/26/2017 137  135 - 145 mmol/L Final  . Potassium 04/26/2017 4.3  3.5 - 5.1 mmol/L Final  . Chloride 04/26/2017 PENDING  101 - 111 mmol/L Incomplete  . CO2 04/26/2017 23  22 - 32 mmol/L Final  . Glucose, Bld 04/26/2017 254* 65 - 99 mg/dL Final  . BUN 04/26/2017 20  6 - 20 mg/dL Final  . Creatinine, Ser 04/26/2017 1.20  0.61 - 1.24 mg/dL Final  . Calcium  04/26/2017 9.1  8.9 - 10.3 mg/dL Final  . Total Protein 04/26/2017 6.9  6.5 - 8.1 g/dL Final  . Albumin 04/26/2017 3.8  3.5 - 5.0 g/dL Final  . AST 04/26/2017 32  15 - 41 U/L Final  . ALT 04/26/2017 29  17 - 63 U/L Final  . Alkaline Phosphatase 04/26/2017 52  38 - 126 U/L Final  . Total Bilirubin 04/26/2017 0.6  0.3 - 1.2 mg/dL Final  . GFR calc non Af Amer 04/26/2017 60* >60 mL/min Final  . GFR calc Af Amer 04/26/2017 >60  >60 mL/min Final   Comment: (NOTE) The eGFR has been calculated using the CKD EPI equation. This calculation has not been validated in all clinical situations. eGFR's persistently <60 mL/min signify possible Chronic Kidney Disease. Performed at [facility], [address] [place], [place] [address]   . Anion gap 04/26/2017 PENDING  5 - 15 Incomplete  . WBC 04/26/2017 4.5  3.8 - 10.6 K/uL Final  . RBC 04/26/2017 3.91* 4.40 - 5.90 MIL/uL Final  . Hemoglobin 04/26/2017 12.0* 13.0 - 18.0 g/dL Final  . HCT 04/26/2017 35.8* 40.0 - 52.0 % Final  . MCV 04/26/2017 91.6  80.0 - 100.0 fL Final  . MCH 04/26/2017 30.6  26.0 - 34.0 pg Final  . MCHC 04/26/2017 33.4  32.0 - 36.0 g/dL Final  . RDW 04/26/2017 17.2* 11.5 - 14.5 % Final  . Platelets 04/26/2017 113* 150 - 440 K/uL Final  . Neutrophils Relative % 04/26/2017 63  % Final  . Neutro Abs 04/26/2017 2.9  1.4 - 6.5 K/uL Final  . Lymphocytes Relative 04/26/2017 26  % Final  . Lymphs Abs 04/26/2017 1.2  1.0 - 3.6 K/uL Final  . Monocytes Relative 04/26/2017 8  % Final  . Monocytes Absolute 04/26/2017 0.3  0.2 - 1.0 K/uL Final  . Eosinophils Relative 04/26/2017 2  % Final  . Eosinophils Absolute 04/26/2017 0.1  0 - 0.7 K/uL Final  . Basophils Relative 04/26/2017 1  % Final  . Basophils Absolute 04/26/2017 0.0  0 - 0.1 K/uL Final   Performed at [facility], [address], [place],  [address]    Assessment:  Blakely Gluth. is a 72 y.o. male with stage IIIB  transverse colon cancer s/p right  partial colectomy on 12/28/2016.  Pathology revealed a 5.5 cm grade II invasive adenocarcinoma of the transverse colon, extending through the muscularis propria and into the adjacent adipose tissue, including the greater omentum. There was metastatic adenocarcinoma in multiple lymph nodes and tumor deposits, at least 5 involved with a total of at least 18 lymph nodes (5 of 18).  Pathologic stage was T3N2a.  CEA was 1.4 on 11/09/2016.  Abdomen and pelvic CT on 11/09/2016 revealed mid transverse colon segment of luminal narrowing with wall thickening compatible with colon cancer.  There was a single enlarged adjacent mesenteric lymph node and prominent subcentimeter lymph nodes anterior to the pancreas which may be metastatic.  There was no evidence of distant metastasis.  There was a left kidney lower pole complex cyst with thin internal septations (Bosniak IIF).  His prostate was enlarged and extended into the floor of bladder.  There was right kidney lower pole punctate nonobstructing nephrolithiasis.  Chest CT on 11/18/2016 revealed no metastatic disease.  There was abrupt termination of the left upper lobe beyond which the distal bronchus appeared dilated with mild branching into the lung parenchyma.  Imaging suggested a bronchocele in the medial aspect of the left upper lobe. Differential included an endobronchial polyp, endobronchial tumor, bronchial stricture or aspirated foreign body.   Electromagnetic navigational bronchoscopy (ENB) on 12/16/2016 revealed non-small cell carcinoma, favor squamous cell lung carcinoma.  Tumor was positive for p40 and negative for cdx-2 and TTF-1.  He received SBRT to the left upper lobe of 5000 cGy in 5 fractions from 02/16/2017 - 03/02/2017.  PET scan on 01/26/2017 revealed a 2.4 x 0.9 cm hypermetabolic medial left upper lobe pulmonary nodule, corresponding to known primary bronchogenic neoplasm. No findings suspicious for metastatic disease.  EGD on 11/05/2016  revealed gastritis.  Pathology confirmed chronic mild gastritis and features of a healing erosion in the stomach.  There was no H pylori, dysplasia or malignancy.  Echocardiogram  On 12/15/2016 revealed mild to moderate aortic valve stenosis. There was abnormal LV relaxation consistent with grade I diastolic dysfunction. Estimated EF was 55-60%.  He is s/p 3 cycles of FOLFOX chemotherapy (03/15/2017 - 04/12/2017).  He has tolerated chemotherapy well.  He has iron deficiency anemia.  He is on oral iron.  Ferritin was 6 on 09/04/2016.  Hematocrit was 29.7 and hemoglobin 8.8 on 09/04/2016.  Hematocrit was 28.1, hemoglobin 9.3, and MCV 87.9 on 11/09/2016.   Ferritin has been followed: 28 on 10/13/2016, 15 on 01/14/2017, 21 on 03/29/2016, 20 on 04/12/2017, and 24 on 021/01/2018.  Symptomatically, he is doing well.  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  Exam is stable.  WBC is 4500 with an [place] of 2900. Hemoglobin is 12.0, hematocrit 35.8, and platelets 113,000.  Plan: 1.  Labs today: CBC with diff, CMP, Mg, ferritin. 2.  Labs reviewed. Blood counts stable and adequate for treatment. Proceed with cycle # 4 FOLFOX chemotherapy. 3.  RTC in 2 days for pump disconnect. 4.  RTC in 2 weeks for MD assessment, labs (CBC with diff, CMP, Mg), and cycle #5 FOLFOX chemotherapy.   [name], NP  04/26/2017, 9:01 AM  I saw and evaluated the patient, participating in the key portions of the service and reviewing pertinent diagnostic studies and records.  I reviewed the nurse practitioner's note and agree with the findings and the plan.  The assessment and plan were discussed with the patient.  A  few questions were asked by the patient and answered.   Nolon Stalls, MD 04/26/2017,9:01 AM

## 2017-04-26 NOTE — Telephone Encounter (Signed)
Called patient and spoke to spouse to inform her that patient will need to eat iron rich foods.  Gave examples - red meat, dark, leafy vegetables, etc.  Verbalized understanding.

## 2017-04-26 NOTE — Progress Notes (Signed)
Blood return noted before, during and after Adrucil push.

## 2017-04-26 NOTE — Progress Notes (Signed)
Patient offers no complaints today. 

## 2017-04-26 NOTE — Telephone Encounter (Signed)
-----   Message from Lequita Asal, MD sent at 04/26/2017 12:36 PM EST ----- Regarding: Please encourage oral iron and iron rich foods   ----- Message ----- From: Interface, Lab In [facility] Sent: 04/26/2017   8:24 AM To: Lequita Asal, MD

## 2017-04-27 ENCOUNTER — Encounter: Payer: Self-pay | Admitting: Urgent Care

## 2017-04-28 ENCOUNTER — Inpatient Hospital Stay: Payer: Medicare Other

## 2017-04-28 DIAGNOSIS — C184 Malignant neoplasm of transverse colon: Secondary | ICD-10-CM

## 2017-04-28 DIAGNOSIS — Z5111 Encounter for antineoplastic chemotherapy: Secondary | ICD-10-CM | POA: Diagnosis not present

## 2017-04-28 MED ORDER — HEPARIN SOD (PORK) LOCK FLUSH 100 UNIT/ML IV SOLN
500.0000 [IU] | Freq: Once | INTRAVENOUS | Status: AC | PRN
  Administered 2017-04-28: 500 [IU]

## 2017-04-28 MED ORDER — SODIUM CHLORIDE 0.9% FLUSH
10.0000 mL | INTRAVENOUS | Status: DC | PRN
  Administered 2017-04-28: 10 mL
  Filled 2017-04-28: qty 10

## 2017-05-08 NOTE — Progress Notes (Signed)
[facility] day:  05/10/2017  Chief Complaint: Donald Mcdowell. is a 72 y.o. male with stage IIIB colon cancer who is seen for assessment prior to cycle #5 FOLFOX chemotherapy.  HPI:   The patient was last seen in the medical oncology clinic on 04/26/2017.  At that time, he was doing well.  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  Exam was stable.  WBC is 4500 with an [place] of 2900. Hemoglobin is 12.0, hematocrit 35.8, and platelets 113,000.  He received cycle #4 FOLFOX.  During the interim, patient is doing well with his chemotherapy. Patient denies any acute concerns. 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 Patient denies nausea, vomiting, and diarrhea. He has not experienced any B symptoms or recent infections.  Patient continues to eat well.  His weight remains stable. Patient denies pain in the clinic.    Past Medical History:  Diagnosis Date  . Abdominal aortic atherosclerosis ([place]) 09/15/2016   CT scan June 2018  . Allergy   . Cancer ([place])   . Coronary artery calcification seen on CAT scan 09/15/2016   Previous smoker; noted on CT scan June 2018  . Diabetes mellitus without complication ([place])   . GERD (gastroesophageal reflux disease)   . Gout   . Hyperlipidemia   . Hypertension   . Kidney function abnormal    stage 2  . Kidney stones   . Obesity 08/20/2015    Past Surgical History:  Procedure Laterality Date  . COLONOSCOPY WITH PROPOFOL N/A 11/05/2016   Procedure: COLONOSCOPY WITH PROPOFOL;  Surgeon: Lucilla Lame, MD;  Location: [facility];  Service: Gastroenterology;  Laterality: N/A;  . ELECTROMAGNETIC NAVIGATION BROCHOSCOPY N/A 12/16/2016   Procedure: ELECTROMAGNETIC NAVIGATION BRONCHOSCOPY;  Surgeon: Flora Lipps, MD;  Location: [facility];  Service: Cardiopulmonary;  Laterality: N/A;  . ESOPHAGOGASTRODUODENOSCOPY (EGD) WITH PROPOFOL N/A  11/05/2016   Procedure: ESOPHAGOGASTRODUODENOSCOPY (EGD) WITH PROPOFOL;  Surgeon: Lucilla Lame, MD;  Location: [facility];  Service: Gastroenterology;  Laterality: N/A;  Diabetic - oral meds  . kidney stones    . PARTIAL COLECTOMY N/A 12/28/2016   Procedure: PARTIAL COLECTOMY RIGHT;  Surgeon: Jules Husbands, MD;  Location: [facility];  Service: General;  Laterality: N/A;  . POLYPECTOMY N/A 11/05/2016   Procedure: POLYPECTOMY;  Surgeon: Lucilla Lame, MD;  Location: [facility];  Service: Gastroenterology;  Laterality: N/A;  . PORTACATH PLACEMENT Right 01/18/2017   Procedure: INSERTION PORT-A-CATH;  Surgeon: Jules Husbands, MD;  Location: [facility];  Service: General;  Laterality: Right;    Family History  Problem Relation Age of Onset  . Alzheimer's disease Mother   . Heart disease Father   . Diabetes Father   . Cancer Father        Prostate  . Cancer Sister        skin cancer    Social History:  reports that he quit smoking about 10 months ago. His smoking use included cigarettes. he has never used smokeless tobacco. He reports that he does not drink alcohol or use drugs.  Patient worked for a [employer].  He retired in 1996.  He was in his 18s. Patient was a 1-2 pack per day smoker since he was a child.  He stopped smoking in 07/2016.  He drank alcohol in the past.  He has 3 living sisters (1 sister died).  He has no children. Patient married to Donald Mcdowell.  Communicate with neighbor Donald Mcdowell  [phone]. The patient is alone today.    Allergies: No Known Allergies  Current Medications: Current Outpatient Medications  Medication Sig Dispense Refill  . allopurinol (ZYLOPRIM) 300 MG tablet Take 1 tablet (300 mg total) by mouth daily. 90 tablet 3  . Cyanocobalamin (VITAMIN B-12 PO) Take 1 tablet by mouth daily.    Marland Kitchen GLIPIZIDE XL 10 MG 24 hr tablet TAKE 1 TABLET BY MOUTH TWICE DAILY 180 tablet 1  . glucose blood (ACCU-CHEK COMPACT PLUS) test strip Check blood sugars  once a day if desired; LON 99 months; Dx E11.21 102 each 1  . glucose blood (ACCU-CHEK COMPACT STRIPS) test strip 100 each by Other route as needed for other. Use as instructed, check FSBS once a day; LON 99 months, E11.9 100 each 1  . lidocaine-prilocaine (EMLA) cream Apply to affected area once 30 g 3  . Magnesium Oxide 250 MG TABS Take 250 mg by mouth every morning.     . metFORMIN (GLUCOPHAGE) 1000 MG tablet Take 1 tablet (1,000 mg total) by mouth 2 (two) times daily with a meal. 180 tablet 1  . mupirocin ointment (BACTROBAN) 2 %   0  . pantoprazole (PROTONIX) 20 MG tablet Take 1 tablet (20 mg total) by mouth daily. 30 tablet 2  . quinapril (ACCUPRIL) 5 MG tablet TAKE 1 TABLET BY MOUTH ONCE DAILY 30 tablet 5  . rosuvastatin (CRESTOR) 10 MG tablet Take 1 tablet (10 mg total) by mouth at bedtime. 90 tablet 1  . saw palmetto 160 MG capsule Take 160 mg 2 (two) times daily by mouth.    . tamsulosin (FLOMAX) 0.4 MG CAPS capsule Take 0.4 mg by mouth daily.     No current facility-administered medications for this visit.     Review of Systems:  GENERAL:  Feels "alright".  No fevers or sweats.  Weight stable.  PERFORMANCE STATUS (ECOG):  1 HEENT:  No visual changes, runny nose, sore throat, mouth sores or tenderness.  Dried blood in nose. Lungs: No shortness of breath or cough.  No hemoptysis. Cardiac: No chest pain, palpitations, orthopnea, or PND. GI:  No nausea, vomiting, constipation, or melena. GU:  No urgency, frequency, dysuria, or hematuria. Musculoskeletal:  No back pain.  No joint pain.  No muscle tenderness. Extremities:  No pain or swelling. Skin:  No rashes or skin changes. 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  No headache, numbness or weakness, balance or coordination issues. Endocrine:  Diabetes.  No thyroid issues, hot flashes or night sweats. Psych:  No mood changes, depression or anxiety. Pain:  No focal pain. Review of systems:  All other systems  reviewed and found to be negative.  Physical Exam: Blood pressure 120/66, pulse (!) 105, temperature 98.1 F (36.7 C), temperature source Tympanic, resp. rate 20, weight 271 lb 5 oz (123.1 kg). GENERAL:  Well developed, well nourished, heavyset gentleman sitting comfortably in the exam room in no acute distress. MENTAL STATUS:  Alert and oriented to person, place and time. HEAD:  Thin gray hair.  Male pattern baldness.  Normocephalic, atraumatic, face symmetric, no Cushingoid features. EYES:  Glasses.  Blue eyes.  Pupils equal round and reactive to light and accomodation.  No conjunctivitis or scleral icterus. ENT:  Oropharynx clear without lesion.  Tongue normal. Mucous membranes moist.  RESPIRATORY:  Clear to auscultation without rales, wheezes or rhonchi. CARDIOVASCULAR:  Regular rate  and rhythm without murmur, rub or gallop. ABDOMEN:  Fully round.  Soft, non-tender, with active bowel sounds, and no appreciable hepatosplenomegaly.  No masses. SKIN:  No rashes, ulcers or lesions. EXTREMITIES: No edema, no skin discoloration or tenderness.  No palpable cords. LYMPH NODES: No palpable cervical, supraclavicular, axillary or inguinal adenopathy  NEUROLOGICAL: Unremarkable. PSYCH:  Appropriate.   Infusion on 05/10/2017  Component Date Value Ref Range Status  . Magnesium 05/10/2017 1.7  1.7 - 2.4 mg/dL Final   Performed at [facility], [address], [place], [place] [address]  . WBC 05/10/2017 4.1  3.8 - 10.6 K/uL Final  . RBC 05/10/2017 3.78* 4.40 - 5.90 MIL/uL Final  . Hemoglobin 05/10/2017 12.1* 13.0 - 18.0 g/dL Final  . HCT 05/10/2017 35.4* 40.0 - 52.0 % Final  . MCV 05/10/2017 93.7  80.0 - 100.0 fL Final  . MCH 05/10/2017 32.0  26.0 - 34.0 pg Final  . MCHC 05/10/2017 34.1  32.0 - 36.0 g/dL Final  . RDW 05/10/2017 17.3* 11.5 - 14.5 % Final  . Platelets 05/10/2017 104* 150 - 440 K/uL Final  . Neutrophils Relative % 05/10/2017 62  % Final  . Neutro Abs 05/10/2017 2.6  1.4 -  6.5 K/uL Final  . Lymphocytes Relative 05/10/2017 26  % Final  . Lymphs Abs 05/10/2017 1.1  1.0 - 3.6 K/uL Final  . Monocytes Relative 05/10/2017 8  % Final  . Monocytes Absolute 05/10/2017 0.3  0.2 - 1.0 K/uL Final  . Eosinophils Relative 05/10/2017 3  % Final  . Eosinophils Absolute 05/10/2017 0.1  0 - 0.7 K/uL Final  . Basophils Relative 05/10/2017 1  % Final  . Basophils Absolute 05/10/2017 0.0  0 - 0.1 K/uL Final   Performed at [facility], [address], [place], [place] [address]  . Sodium 05/10/2017 136  135 - 145 mmol/L Final  . Potassium 05/10/2017 4.1  3.5 - 5.1 mmol/L Final  . Chloride 05/10/2017 107  101 - 111 mmol/L Final  . CO2 05/10/2017 20* 22 - 32 mmol/L Final  . Glucose, Bld 05/10/2017 299* 65 - 99 mg/dL Final  . BUN 05/10/2017 19  6 - 20 mg/dL Final  . Creatinine, Ser 05/10/2017 1.23  0.61 - 1.24 mg/dL Final  . Calcium 05/10/2017 9.1  8.9 - 10.3 mg/dL Final  . Total Protein 05/10/2017 6.7  6.5 - 8.1 g/dL Final  . Albumin 05/10/2017 3.7  3.5 - 5.0 g/dL Final  . AST 05/10/2017 37  15 - 41 U/L Final  . ALT 05/10/2017 36  17 - 63 U/L Final  . Alkaline Phosphatase 05/10/2017 59  38 - 126 U/L Final  . Total Bilirubin 05/10/2017 0.4  0.3 - 1.2 mg/dL Final  . GFR calc non Af Amer 05/10/2017 58* >60 mL/min Final  . GFR calc Af Amer 05/10/2017 >60  >60 mL/min Final   Comment: (NOTE) The eGFR has been calculated using the CKD EPI equation. This calculation has not been validated in all clinical situations. eGFR's persistently <60 mL/min signify possible Chronic Kidney Disease.   Georgiann Hahn gap 05/10/2017 9  5 - 15 Final   Performed at [facility], [address] [place], [place] [address]    Assessment:  Donald Severs. is a 72 y.o. male with stage IIIB transverse colon cancer s/p right partial colectomy on 12/28/2016.  Pathology revealed a 5.5 cm grade II invasive adenocarcinoma of the transverse colon, extending through the muscularis propria and  into  the adjacent adipose tissue, including the greater omentum. There was metastatic adenocarcinoma in multiple lymph nodes and tumor deposits, at least 5 involved with a total of at least 18 lymph nodes (5 of 18).  Pathologic stage was T3N2a.  CEA was 1.4 on 11/09/2016.  Abdomen and pelvic CT on 11/09/2016 revealed mid transverse colon segment of luminal narrowing with wall thickening compatible with colon cancer.  There was a single enlarged adjacent mesenteric lymph node and prominent subcentimeter lymph nodes anterior to the pancreas which may be metastatic.  There was no evidence of distant metastasis.  There was a left kidney lower pole complex cyst with thin internal septations (Bosniak IIF).  His prostate was enlarged and extended into the floor of bladder.  There was right kidney lower pole punctate nonobstructing nephrolithiasis.  Chest CT on 11/18/2016 revealed no metastatic disease.  There was abrupt termination of the left upper lobe beyond which the distal bronchus appeared dilated with mild branching into the lung parenchyma.  Imaging suggested a bronchocele in the medial aspect of the left upper lobe. Differential included an endobronchial polyp, endobronchial tumor, bronchial stricture or aspirated foreign body.   Electromagnetic navigational bronchoscopy (ENB) on 12/16/2016 revealed non-small cell carcinoma, favor squamous cell lung carcinoma.  Tumor was positive for p40 and negative for cdx-2 and TTF-1.  He received SBRT to the left upper lobe of 5000 cGy in 5 fractions from 02/16/2017 - 03/02/2017.  PET scan on 01/26/2017 revealed a 2.4 x 0.9 cm hypermetabolic medial left upper lobe pulmonary nodule, corresponding to known primary bronchogenic neoplasm. No findings suspicious for metastatic disease.  EGD on 11/05/2016 revealed gastritis.  Pathology confirmed chronic mild gastritis and features of a healing erosion in the stomach.  There was no H pylori, dysplasia or  malignancy.  Echocardiogram  On 12/15/2016 revealed mild to moderate aortic valve stenosis. There was abnormal LV relaxation consistent with grade I diastolic dysfunction. Estimated EF was 55-60%.  He is s/p 4 cycles of FOLFOX chemotherapy (03/15/2017 - 04/26/2017).  He has tolerated chemotherapy well.  He has iron deficiency anemia.  He is on oral iron.  Ferritin was 6 on 09/04/2016.  Hematocrit was 29.7 and hemoglobin 8.8 on 09/04/2016.  Hematocrit was 28.1, hemoglobin 9.3, and MCV 87.9 on 11/09/2016.   Ferritin has been followed: 28 on 10/13/2016, 15 on 01/14/2017, 21 on 03/29/2016, 20 on 04/12/2017, and 24 on 021/01/2018.  Symptomatically, he is doing well.  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  Exam is stable.  WBC is 4100 with an [place] of 2600. Hemoglobin is 12.1, hematocrit 35.4, and platelets 104,000. Mg normal at 1.7.  Plan: 1.  Labs today: CBC with diff, CMP, Mg. 2.  Labs reviewed. Platelets trending down; 104,000 today. Discuss potential need to hold chemo is platelets drop < 100,000. Blood counts stable and adequate for treatment. Proceed with cycle # 5 FOLFOX chemotherapy. 3.  RTC in 2 days for pump disconnect. 4.  RTC in 2 weeks for MD assessment, labs (CBC with diff, CMP, Mg), and cycle #6 FOLFOX chemotherapy.   [name], NP  05/10/2017, 8:51 AM  I saw and evaluated the patient, participating in the key portions of the service and reviewing pertinent diagnostic studies and records.  I reviewed the nurse practitioner's note and agree with the findings and the plan.  The assessment and plan were discussed with the patient.  A few questions were asked by the patient and answered.   Melissa  Mike Gip, MD 05/10/2017,8:51 AM

## 2017-05-10 ENCOUNTER — Encounter: Payer: Self-pay | Admitting: Hematology and Oncology

## 2017-05-10 ENCOUNTER — Inpatient Hospital Stay (HOSPITAL_BASED_OUTPATIENT_CLINIC_OR_DEPARTMENT_OTHER): Payer: Medicare Other | Admitting: Hematology and Oncology

## 2017-05-10 ENCOUNTER — Inpatient Hospital Stay: Payer: Medicare Other

## 2017-05-10 VITALS — HR 82

## 2017-05-10 VITALS — BP 120/66 | HR 105 | Temp 98.1°F | Resp 20 | Wt 271.3 lb

## 2017-05-10 DIAGNOSIS — Z7189 Other specified counseling: Secondary | ICD-10-CM

## 2017-05-10 DIAGNOSIS — C3412 Malignant neoplasm of upper lobe, left bronchus or lung: Secondary | ICD-10-CM | POA: Diagnosis not present

## 2017-05-10 DIAGNOSIS — G629 Polyneuropathy, unspecified: Secondary | ICD-10-CM | POA: Diagnosis not present

## 2017-05-10 DIAGNOSIS — Z5111 Encounter for antineoplastic chemotherapy: Secondary | ICD-10-CM

## 2017-05-10 DIAGNOSIS — C184 Malignant neoplasm of transverse colon: Secondary | ICD-10-CM

## 2017-05-10 DIAGNOSIS — D509 Iron deficiency anemia, unspecified: Secondary | ICD-10-CM | POA: Diagnosis not present

## 2017-05-10 LAB — COMPREHENSIVE METABOLIC PANEL
ALT: 36 U/L (ref 17–63)
AST: 37 U/L (ref 15–41)
Albumin: 3.7 g/dL (ref 3.5–5.0)
Alkaline Phosphatase: 59 U/L (ref 38–126)
Anion gap: 9 (ref 5–15)
BUN: 19 mg/dL (ref 6–20)
CO2: 20 mmol/L — ABNORMAL LOW (ref 22–32)
Calcium: 9.1 mg/dL (ref 8.9–10.3)
Chloride: 107 mmol/L (ref 101–111)
Creatinine, Ser: 1.23 mg/dL (ref 0.61–1.24)
GFR calc Af Amer: >60 mL/min (ref >60)
GFR calc non Af Amer: 58 mL/min — ABNORMAL LOW (ref >60)
Glucose, Bld: 299 mg/dL — ABNORMAL HIGH (ref 65–99)
Potassium: 4.1 mmol/L (ref 3.5–5.1)
Sodium: 136 mmol/L (ref 135–145)
Total Bilirubin: 0.4 mg/dL (ref 0.3–1.2)
Total Protein: 6.7 g/dL (ref 6.5–8.1)

## 2017-05-10 LAB — MAGNESIUM: Magnesium: 1.7 mg/dL (ref 1.7–2.4)

## 2017-05-10 LAB — CBC WITH DIFFERENTIAL/PLATELET
Basophils Absolute: 0 10*3/uL (ref 0–0.1)
Basophils Relative: 1 %
Eosinophils Absolute: 0.1 10*3/uL (ref 0–0.7)
Eosinophils Relative: 3 %
HCT: 35.4 % — ABNORMAL LOW (ref 40.0–52.0)
Hemoglobin: 12.1 g/dL — ABNORMAL LOW (ref 13.0–18.0)
Lymphocytes Relative: 26 %
Lymphs Abs: 1.1 10*3/uL (ref 1.0–3.6)
MCH: 32 pg (ref 26.0–34.0)
MCHC: 34.1 g/dL (ref 32.0–36.0)
MCV: 93.7 fL (ref 80.0–100.0)
Monocytes Absolute: 0.3 10*3/uL (ref 0.2–1.0)
Monocytes Relative: 8 %
Neutro Abs: 2.6 10*3/uL (ref 1.4–6.5)
Neutrophils Relative %: 62 %
Platelets: 104 10*3/uL — ABNORMAL LOW (ref 150–440)
RBC: 3.78 MIL/uL — ABNORMAL LOW (ref 4.40–5.90)
RDW: 17.3 % — ABNORMAL HIGH (ref 11.5–14.5)
WBC: 4.1 10*3/uL (ref 3.8–10.6)

## 2017-05-10 MED ORDER — DEXTROSE 5 % IV SOLN
Freq: Once | INTRAVENOUS | Status: AC
  Administered 2017-05-10: 09:00:00 via INTRAVENOUS
  Filled 2017-05-10: qty 1000

## 2017-05-10 MED ORDER — SODIUM CHLORIDE 0.9 % IV SOLN: 10.0000 mg | Freq: Once | INTRAVENOUS | Status: DC

## 2017-05-10 MED ORDER — OXALIPLATIN CHEMO INJECTION 100 MG/20ML
85.0000 mg/m2 | Freq: Once | INTRAVENOUS | Status: DC
  Filled 2017-05-10: qty 42

## 2017-05-10 MED ORDER — FLUOROURACIL CHEMO INJECTION 2.5 GM/50ML
400.0000 mg/m2 | Freq: Once | INTRAVENOUS | Status: AC
  Administered 2017-05-10: 1000 mg via INTRAVENOUS
  Filled 2017-05-10: qty 20

## 2017-05-10 MED ORDER — FLUOROURACIL CHEMO INJECTION 5 GM/100ML
2400.0000 mg/m2 | INTRAVENOUS | Status: DC
  Administered 2017-05-10: 5950 mg via INTRAVENOUS
  Filled 2017-05-10: qty 100

## 2017-05-10 MED ORDER — DEXTROSE 5 % IV SOLN
1000.0000 mg | Freq: Once | INTRAVENOUS | Status: AC
  Administered 2017-05-10: 1000 mg via INTRAVENOUS
  Filled 2017-05-10: qty 50

## 2017-05-10 MED ORDER — OXALIPLATIN CHEMO INJECTION 100 MG/20ML
200.0000 mg | Freq: Once | INTRAVENOUS | Status: AC
  Administered 2017-05-10: 200 mg via INTRAVENOUS
  Filled 2017-05-10: qty 40

## 2017-05-10 MED ORDER — DEXAMETHASONE SODIUM PHOSPHATE 10 MG/ML IJ SOLN
10.0000 mg | Freq: Once | INTRAMUSCULAR | Status: AC
  Administered 2017-05-10: 10 mg via INTRAVENOUS
  Filled 2017-05-10: qty 1

## 2017-05-10 MED ORDER — PALONOSETRON HCL INJECTION 0.25 MG/5ML
0.2500 mg | Freq: Once | INTRAVENOUS | Status: AC
  Administered 2017-05-10: 0.25 mg via INTRAVENOUS
  Filled 2017-05-10: qty 5

## 2017-05-10 NOTE — Progress Notes (Signed)
Oxaliplatin dose reduced from 210mg  to 200mg  for to reduce vial waste. Dose is within 10% of calculated doe.

## 2017-05-10 NOTE — Progress Notes (Signed)
Blood return noted before, during and after Adrucil push.

## 2017-05-12 ENCOUNTER — Inpatient Hospital Stay: Payer: Medicare Other

## 2017-05-12 DIAGNOSIS — C801 Malignant (primary) neoplasm, unspecified: Secondary | ICD-10-CM

## 2017-05-12 DIAGNOSIS — Z5111 Encounter for antineoplastic chemotherapy: Secondary | ICD-10-CM | POA: Diagnosis not present

## 2017-05-12 MED ORDER — SODIUM CHLORIDE 0.9% FLUSH
10.0000 mL | INTRAVENOUS | Status: DC | PRN
  Administered 2017-05-12: 10 mL via INTRAVENOUS
  Filled 2017-05-12: qty 10

## 2017-05-12 MED ORDER — HEPARIN SOD (PORK) LOCK FLUSH 100 UNIT/ML IV SOLN
500.0000 [IU] | Freq: Once | INTRAVENOUS | Status: AC
  Administered 2017-05-12: 500 [IU] via INTRAVENOUS
  Filled 2017-05-12: qty 5

## 2017-05-22 ENCOUNTER — Other Ambulatory Visit: Payer: Self-pay | Admitting: Family Medicine

## 2017-05-24 ENCOUNTER — Encounter: Payer: Self-pay | Admitting: Hematology and Oncology

## 2017-05-24 ENCOUNTER — Inpatient Hospital Stay: Payer: Medicare Other

## 2017-05-24 ENCOUNTER — Inpatient Hospital Stay: Payer: Medicare Other | Attending: Hematology and Oncology

## 2017-05-24 ENCOUNTER — Inpatient Hospital Stay (HOSPITAL_BASED_OUTPATIENT_CLINIC_OR_DEPARTMENT_OTHER): Payer: Medicare Other | Admitting: Hematology and Oncology

## 2017-05-24 VITALS — BP 115/72 | HR 102 | Temp 98.1°F | Resp 20 | Wt 275.4 lb

## 2017-05-24 VITALS — HR 94

## 2017-05-24 DIAGNOSIS — R0602 Shortness of breath: Secondary | ICD-10-CM | POA: Diagnosis not present

## 2017-05-24 DIAGNOSIS — D509 Iron deficiency anemia, unspecified: Secondary | ICD-10-CM | POA: Diagnosis not present

## 2017-05-24 DIAGNOSIS — C184 Malignant neoplasm of transverse colon: Secondary | ICD-10-CM

## 2017-05-24 DIAGNOSIS — Z452 Encounter for adjustment and management of vascular access device: Secondary | ICD-10-CM | POA: Diagnosis not present

## 2017-05-24 DIAGNOSIS — Z7189 Other specified counseling: Secondary | ICD-10-CM

## 2017-05-24 DIAGNOSIS — C3412 Malignant neoplasm of upper lobe, left bronchus or lung: Secondary | ICD-10-CM | POA: Insufficient documentation

## 2017-05-24 DIAGNOSIS — Z5111 Encounter for antineoplastic chemotherapy: Secondary | ICD-10-CM | POA: Insufficient documentation

## 2017-05-24 LAB — COMPREHENSIVE METABOLIC PANEL
ALT: 29 U/L (ref 17–63)
AST: 32 U/L (ref 15–41)
Albumin: 3.5 g/dL (ref 3.5–5.0)
Alkaline Phosphatase: 57 U/L (ref 38–126)
Anion gap: 9 (ref 5–15)
BUN: 16 mg/dL (ref 6–20)
CO2: 22 mmol/L (ref 22–32)
Calcium: 9.2 mg/dL (ref 8.9–10.3)
Chloride: 105 mmol/L (ref 101–111)
Creatinine, Ser: 1.07 mg/dL (ref 0.61–1.24)
GFR calc Af Amer: >60 mL/min (ref >60)
GFR calc non Af Amer: >60 mL/min (ref >60)
Glucose, Bld: 269 mg/dL — ABNORMAL HIGH (ref 65–99)
Potassium: 4.3 mmol/L (ref 3.5–5.1)
Sodium: 136 mmol/L (ref 135–145)
Total Bilirubin: 0.5 mg/dL (ref 0.3–1.2)
Total Protein: 6.7 g/dL (ref 6.5–8.1)

## 2017-05-24 LAB — CBC WITH DIFFERENTIAL/PLATELET
Basophils Absolute: 0 10*3/uL (ref 0–0.1)
Basophils Relative: 1 %
Eosinophils Absolute: 0.1 10*3/uL (ref 0–0.7)
Eosinophils Relative: 2 %
HCT: 35 % — ABNORMAL LOW (ref 40.0–52.0)
Hemoglobin: 12 g/dL — ABNORMAL LOW (ref 13.0–18.0)
Lymphocytes Relative: 30 %
Lymphs Abs: 1.2 10*3/uL (ref 1.0–3.6)
MCH: 32.5 pg (ref 26.0–34.0)
MCHC: 34.2 g/dL (ref 32.0–36.0)
MCV: 95.1 fL (ref 80.0–100.0)
Monocytes Absolute: 0.4 10*3/uL (ref 0.2–1.0)
Monocytes Relative: 10 %
Neutro Abs: 2.2 10*3/uL (ref 1.4–6.5)
Neutrophils Relative %: 57 %
Platelets: 110 10*3/uL — ABNORMAL LOW (ref 150–440)
RBC: 3.68 MIL/uL — ABNORMAL LOW (ref 4.40–5.90)
RDW: 18.5 % — ABNORMAL HIGH (ref 11.5–14.5)
WBC: 3.9 10*3/uL (ref 3.8–10.6)

## 2017-05-24 LAB — MAGNESIUM: Magnesium: 1.8 mg/dL (ref 1.7–2.4)

## 2017-05-24 MED ORDER — SODIUM CHLORIDE 0.9 % IV SOLN: 10.0000 mg | Freq: Once | INTRAVENOUS | Status: DC

## 2017-05-24 MED ORDER — OXALIPLATIN CHEMO INJECTION 100 MG/20ML
200.0000 mg | Freq: Once | INTRAVENOUS | Status: AC
  Administered 2017-05-24: 200 mg via INTRAVENOUS
  Filled 2017-05-24: qty 40

## 2017-05-24 MED ORDER — FLUOROURACIL CHEMO INJECTION 2.5 GM/50ML
400.0000 mg/m2 | Freq: Once | INTRAVENOUS | Status: AC
  Administered 2017-05-24: 1000 mg via INTRAVENOUS
  Filled 2017-05-24: qty 20

## 2017-05-24 MED ORDER — SODIUM CHLORIDE 0.9 % IV SOLN
2400.0000 mg/m2 | INTRAVENOUS | Status: DC
  Administered 2017-05-24: 5950 mg via INTRAVENOUS
  Filled 2017-05-24: qty 100

## 2017-05-24 MED ORDER — DEXAMETHASONE SODIUM PHOSPHATE 10 MG/ML IJ SOLN
10.0000 mg | Freq: Once | INTRAMUSCULAR | Status: AC
  Administered 2017-05-24: 10 mg via INTRAVENOUS
  Filled 2017-05-24: qty 1

## 2017-05-24 MED ORDER — DEXTROSE 5 % IV SOLN
Freq: Once | INTRAVENOUS | Status: AC
  Administered 2017-05-24: 09:00:00 via INTRAVENOUS
  Filled 2017-05-24: qty 1000

## 2017-05-24 MED ORDER — PALONOSETRON HCL INJECTION 0.25 MG/5ML
0.2500 mg | Freq: Once | INTRAVENOUS | Status: AC
  Administered 2017-05-24: 0.25 mg via INTRAVENOUS
  Filled 2017-05-24: qty 5

## 2017-05-24 MED ORDER — LEUCOVORIN CALCIUM INJECTION 350 MG
1000.0000 mg | Freq: Once | INTRAVENOUS | Status: AC
  Administered 2017-05-24: 1000 mg via INTRAVENOUS
  Filled 2017-05-24: qty 50

## 2017-05-24 NOTE — Progress Notes (Signed)
[facility] day:  05/24/2017  Chief Complaint: Donald Mcdowell. is a 72 y.o. male with stage IIIB colon cancer who is seen for assessment prior to cycle #6 FOLFOX chemotherapy.  HPI:   The patient was last seen in the medical oncology clinic on 05/10/2017.  At that time, he was doing well.  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  Exam was stable.  WBC was 4100 with an [place] of 2600. Hemoglobin was 12.1, hematocrit 35.4, and platelets 104,000. Magnesium was normal.  He received cycle #5 FOLFOX.   During the interim, patient has been doing well. He is smiling and making jokes in clinic today. Patient continues to experience a cold neuropathy in the 3rd-5th digits on his RIGHT hand. Patient notes that it "seems to be lasting longer".  He denies any neuropathy without cold.  Patient denies any nausea, vomiting, or diarrhea. He denies any B symptoms or interval infections.  Patient is eating well. His weight is up 4 pounds. Patient denies pain in the clinic today.    Past Medical History:  Diagnosis Date  . Abdominal aortic atherosclerosis ([place]) 09/15/2016   CT scan June 2018  . Allergy   . Cancer ([place])   . Coronary artery calcification seen on CAT scan 09/15/2016   Previous smoker; noted on CT scan June 2018  . Diabetes mellitus without complication ([place])   . GERD (gastroesophageal reflux disease)   . Gout   . Hyperlipidemia   . Hypertension   . Kidney function abnormal    stage 2  . Kidney stones   . Obesity 08/20/2015    Past Surgical History:  Procedure Laterality Date  . COLONOSCOPY WITH PROPOFOL N/A 11/05/2016   Procedure: COLONOSCOPY WITH PROPOFOL;  Surgeon: Lucilla Lame, MD;  Location: [facility];  Service: Gastroenterology;  Laterality: N/A;  . ELECTROMAGNETIC NAVIGATION BROCHOSCOPY N/A 12/16/2016   Procedure: ELECTROMAGNETIC NAVIGATION BRONCHOSCOPY;  Surgeon: Flora Lipps, MD;  Location: [facility]  [facility];  Service: Cardiopulmonary;  Laterality: N/A;  . ESOPHAGOGASTRODUODENOSCOPY (EGD) WITH PROPOFOL N/A 11/05/2016   Procedure: ESOPHAGOGASTRODUODENOSCOPY (EGD) WITH PROPOFOL;  Surgeon: Lucilla Lame, MD;  Location: [facility];  Service: Gastroenterology;  Laterality: N/A;  Diabetic - oral meds  . kidney stones    . PARTIAL COLECTOMY N/A 12/28/2016   Procedure: PARTIAL COLECTOMY RIGHT;  Surgeon: Jules Husbands, MD;  Location: [facility] ORS;  Service: General;  Laterality: N/A;  . POLYPECTOMY N/A 11/05/2016   Procedure: POLYPECTOMY;  Surgeon: Lucilla Lame, MD;  Location: [facility];  Service: Gastroenterology;  Laterality: N/A;  . PORTACATH PLACEMENT Right 01/18/2017   Procedure: INSERTION PORT-A-CATH;  Surgeon: Jules Husbands, MD;  Location: [facility];  Service: General;  Laterality: Right;    Family History  Problem Relation Age of Onset  . Alzheimer's disease Mother   . Heart disease Father   . Diabetes Father   . Cancer Father        Prostate  . Cancer Sister        skin cancer    Social History:  reports that he quit smoking about 11 months ago. His smoking use included cigarettes. he has never used smokeless tobacco. He reports that he does not drink alcohol or use drugs.  Patient worked for a [employer].  He retired in 1996.  He was in his 70s. Patient was a 1-2 pack per day smoker since he was a child.  He stopped smoking in 07/2016.  He drank alcohol in the past.  He has 3 living sisters (1 sister died).  He has no children. Patient married to [place].  Communicate with neighbor [name]  [phone]. The patient is by his neighbor, [name], today.    Allergies: No Known Allergies  Current Medications: Current Outpatient Medications  Medication Sig Dispense Refill  . allopurinol (ZYLOPRIM) 300 MG tablet Take 1 tablet (300 mg total) by mouth daily. 90 tablet 3  . Cyanocobalamin (VITAMIN B-12 PO) Take 1 tablet by mouth daily.    Marland Kitchen GLIPIZIDE XL 10 MG 24 hr  tablet TAKE 1 TABLET BY MOUTH TWICE DAILY 180 tablet 1  . glucose blood (ACCU-CHEK COMPACT PLUS) test strip Check blood sugars once a day if desired; LON 99 months; Dx E11.21 102 each 1  . glucose blood (ACCU-CHEK COMPACT STRIPS) test strip 100 each by Other route as needed for other. Use as instructed, check FSBS once a day; LON 99 months, E11.9 100 each 1  . lidocaine-prilocaine (EMLA) cream Apply to affected area once 30 g 3  . Magnesium Oxide 250 MG TABS Take 250 mg by mouth every morning.     . metFORMIN (GLUCOPHAGE) 1000 MG tablet Take 1 tablet (1,000 mg total) by mouth 2 (two) times daily with a meal. 180 tablet 1  . mupirocin ointment (BACTROBAN) 2 %   0  . pantoprazole (PROTONIX) 20 MG tablet TAKE 1 TABLET BY MOUTH ONCE DAILY 90 tablet 0  . quinapril (ACCUPRIL) 5 MG tablet TAKE 1 TABLET BY MOUTH ONCE DAILY 30 tablet 5  . rosuvastatin (CRESTOR) 10 MG tablet Take 1 tablet (10 mg total) by mouth at bedtime. 90 tablet 1  . saw palmetto 160 MG capsule Take 160 mg 2 (two) times daily by mouth.    . tamsulosin (FLOMAX) 0.4 MG CAPS capsule Take 0.4 mg by mouth daily.     No current facility-administered medications for this visit.     Review of Systems:  GENERAL:  Feels "good, I guess".  No fevers or sweats.  Weight up 4 pounds.  PERFORMANCE STATUS (ECOG):  1 HEENT:  No visual changes, runny nose, sore throat, mouth sores or tenderness.  Dried blood in nose. Lungs: No shortness of breath or cough.  No hemoptysis. Cardiac: No chest pain, palpitations, orthopnea, or PND. GI:  No nausea, vomiting, constipation, or melena. GU:  No urgency, frequency, dysuria, or hematuria. Musculoskeletal:  No back pain.  No joint pain.  No muscle tenderness. Extremities:  No pain or swelling. Skin:  No rashes or skin changes. 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  No headache, numbness or weakness, balance or coordination issues. Endocrine:  Diabetes.  No thyroid issues, hot flashes or  night sweats. Psych:  No mood changes, depression or anxiety. Pain:  No focal pain. Review of systems:  All other systems reviewed and found to be negative.  Physical Exam: Blood pressure 115/72, pulse (!) 102, temperature 98.1 F (36.7 C), temperature source Tympanic, resp. rate 20, weight 275 lb 6 oz (124.9 kg). GENERAL:  Well developed, well nourished, heavyset gentleman sitting comfortably in the exam room in no acute distress. MENTAL STATUS:  Alert and oriented to person, place and time. HEAD:  Thin gray hair.  Male pattern baldness.  Normocephalic, atraumatic, face symmetric, no Cushingoid features. EYES:  Glasses.  Blue eyes.  Pupils equal round and reactive to light and accomodation.  No conjunctivitis or scleral icterus.  ENT:  Oropharynx clear without lesion.  Tongue normal. Mucous membranes moist.  RESPIRATORY:  Clear to auscultation without rales, wheezes or rhonchi. CARDIOVASCULAR:  Regular rate and rhythm without murmur, rub or gallop. ABDOMEN:  Fully round.  Soft, non-tender, with active bowel sounds, and no appreciable hepatosplenomegaly.  No masses. SKIN:  No rashes, ulcers or lesions. EXTREMITIES: No edema, no skin discoloration or tenderness.  No palpable cords. LYMPH NODES: No palpable cervical, supraclavicular, axillary or inguinal adenopathy  NEUROLOGICAL: Unremarkable. PSYCH:  Appropriate.   Infusion on 05/24/2017  Component Date Value Ref Range Status  . Magnesium 05/24/2017 1.8  1.7 - 2.4 mg/dL Final   Performed at [facility], [address] [place], [place] [address]  . Sodium 05/24/2017 136  135 - 145 mmol/L Final  . Potassium 05/24/2017 4.3  3.5 - 5.1 mmol/L Final  . Chloride 05/24/2017 105  101 - 111 mmol/L Final  . CO2 05/24/2017 22  22 - 32 mmol/L Final  . Glucose, Bld 05/24/2017 269* 65 - 99 mg/dL Final  . BUN 05/24/2017 16  6 - 20 mg/dL Final  . Creatinine, Ser 05/24/2017 1.07  0.61 - 1.24 mg/dL Final  . Calcium 05/24/2017 9.2  8.9 - 10.3  mg/dL Final  . Total Protein 05/24/2017 6.7  6.5 - 8.1 g/dL Final  . Albumin 05/24/2017 3.5  3.5 - 5.0 g/dL Final  . AST 05/24/2017 32  15 - 41 U/L Final  . ALT 05/24/2017 29  17 - 63 U/L Final  . Alkaline Phosphatase 05/24/2017 57  38 - 126 U/L Final  . Total Bilirubin 05/24/2017 0.5  0.3 - 1.2 mg/dL Final  . GFR calc non Af Amer 05/24/2017 >60  >60 mL/min Final  . GFR calc Af Amer 05/24/2017 >60  >60 mL/min Final   Comment: (NOTE) The eGFR has been calculated using the CKD EPI equation. This calculation has not been validated in all clinical situations. eGFR's persistently <60 mL/min signify possible Chronic Kidney Disease.   Georgiann Hahn gap 05/24/2017 9  5 - 15 Final   Performed at [facility], [address] [place], [place] [address]  . WBC 05/24/2017 3.9  3.8 - 10.6 K/uL Final  . RBC 05/24/2017 3.68* 4.40 - 5.90 MIL/uL Final  . Hemoglobin 05/24/2017 12.0* 13.0 - 18.0 g/dL Final  . HCT 05/24/2017 35.0* 40.0 - 52.0 % Final  . MCV 05/24/2017 95.1  80.0 - 100.0 fL Final  . MCH 05/24/2017 32.5  26.0 - 34.0 pg Final  . MCHC 05/24/2017 34.2  32.0 - 36.0 g/dL Final  . RDW 05/24/2017 18.5* 11.5 - 14.5 % Final  . Platelets 05/24/2017 110* 150 - 440 K/uL Final  . Neutrophils Relative % 05/24/2017 57  % Final  . Neutro Abs 05/24/2017 2.2  1.4 - 6.5 K/uL Final  . Lymphocytes Relative 05/24/2017 30  % Final  . Lymphs Abs 05/24/2017 1.2  1.0 - 3.6 K/uL Final  . Monocytes Relative 05/24/2017 10  % Final  . Monocytes Absolute 05/24/2017 0.4  0.2 - 1.0 K/uL Final  . Eosinophils Relative 05/24/2017 2  % Final  . Eosinophils Absolute 05/24/2017 0.1  0 - 0.7 K/uL Final  . Basophils Relative 05/24/2017 1  % Final  . Basophils Absolute 05/24/2017 0.0  0 - 0.1 K/uL Final   Performed at [facility], [address] [place], [place] [address]    Assessment:  Kinser Fellman. is a 72 y.o. male with stage IIIB transverse colon  cancer s/p right partial colectomy on 12/28/2016.   Pathology revealed a 5.5 cm grade II invasive adenocarcinoma of the transverse colon, extending through the muscularis propria and into the adjacent adipose tissue, including the greater omentum. There was metastatic adenocarcinoma in multiple lymph nodes and tumor deposits, at least 5 involved with a total of at least 18 lymph nodes (5 of 18).  Pathologic stage was T3N2a.  CEA was 1.4 on 11/09/2016.  Abdomen and pelvic CT on 11/09/2016 revealed mid transverse colon segment of luminal narrowing with wall thickening compatible with colon cancer.  There was a single enlarged adjacent mesenteric lymph node and prominent subcentimeter lymph nodes anterior to the pancreas which may be metastatic.  There was no evidence of distant metastasis.  There was a left kidney lower pole complex cyst with thin internal septations (Bosniak IIF).  His prostate was enlarged and extended into the floor of bladder.  There was right kidney lower pole punctate nonobstructing nephrolithiasis.  Chest CT on 11/18/2016 revealed no metastatic disease.  There was abrupt termination of the left upper lobe beyond which the distal bronchus appeared dilated with mild branching into the lung parenchyma.  Imaging suggested a bronchocele in the medial aspect of the left upper lobe. Differential included an endobronchial polyp, endobronchial tumor, bronchial stricture or aspirated foreign body.   Electromagnetic navigational bronchoscopy (ENB) on 12/16/2016 revealed non-small cell carcinoma, favor squamous cell lung carcinoma.  Tumor was positive for p40 and negative for cdx-2 and TTF-1.  He received SBRT to the left upper lobe of 5000 cGy in 5 fractions from 02/16/2017 - 03/02/2017.  PET scan on 01/26/2017 revealed a 2.4 x 0.9 cm hypermetabolic medial left upper lobe pulmonary nodule, corresponding to known primary bronchogenic neoplasm. No findings suspicious for metastatic disease.  EGD on 11/05/2016 revealed gastritis.  Pathology  confirmed chronic mild gastritis and features of a healing erosion in the stomach.  There was no H pylori, dysplasia or malignancy.  Echocardiogram  On 12/15/2016 revealed mild to moderate aortic valve stenosis. There was abnormal LV relaxation consistent with grade I diastolic dysfunction. Estimated EF was 55-60%.  He is s/p 5 cycles of FOLFOX chemotherapy (03/15/2017 - 05/10/2017).  He has tolerated chemotherapy well.  He has iron deficiency anemia.  He is on oral iron.  Ferritin was 6 on 09/04/2016.  Hematocrit was 29.7 and hemoglobin 8.8 on 09/04/2016.  Hematocrit was 28.1, hemoglobin 9.3, and MCV 87.9 on 11/09/2016.   Ferritin has been followed: 28 on 10/13/2016, 15 on 01/14/2017, 21 on 03/29/2016, 20 on 04/12/2017, and 24 on 021/01/2018.  Symptomatically, he is doing well.  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  He denies nausea, vomiting, and diarrhea. Exam is stable.  WBC is 3900 with an [place] of 2200. Hemoglobin is 12.0, hematocrit 35.0, and platelets 110,000. Mg normal at 1.8.  Plan: 1.  Labs today: CBC with diff, CMP, Mg. 2.  Labs reviewed. Blood counts stable and adequate for treatment. Proceed with cycle # 6 FOLFOX chemotherapy. 3.  RTC in 2 days for pump disconnect. 4.  RTC in 1 week for labs (CBC with diff). 5.  RTC in 2 weeks for MD assessment, labs (CBC with diff, CMP, Mg), and cycle #7 FOLFOX chemotherapy.   [name], NP  05/24/2017, 9:07 AM  I saw and evaluated the patient, participating in the key portions of the service and reviewing pertinent diagnostic studies and records.  I reviewed the nurse practitioner's note and agree  with the findings and the plan.  The assessment and plan were discussed with the patient.  A few questions were asked by the patient and answered.   Nolon Stalls, MD 05/24/2017,9:07 AM

## 2017-05-24 NOTE — Progress Notes (Signed)
Patient offers no complaints.

## 2017-05-26 ENCOUNTER — Inpatient Hospital Stay: Payer: Medicare Other

## 2017-05-26 VITALS — BP 114/61 | HR 109 | Temp 98.6°F

## 2017-05-26 DIAGNOSIS — C184 Malignant neoplasm of transverse colon: Secondary | ICD-10-CM

## 2017-05-26 DIAGNOSIS — Z5111 Encounter for antineoplastic chemotherapy: Secondary | ICD-10-CM | POA: Diagnosis not present

## 2017-05-26 MED ORDER — HEPARIN SOD (PORK) LOCK FLUSH 100 UNIT/ML IV SOLN
500.0000 [IU] | Freq: Once | INTRAVENOUS | Status: AC | PRN
  Administered 2017-05-26: 500 [IU]
  Filled 2017-05-26: qty 5

## 2017-05-26 MED ORDER — SODIUM CHLORIDE 0.9% FLUSH
10.0000 mL | INTRAVENOUS | Status: DC | PRN
  Administered 2017-05-26: 10 mL
  Filled 2017-05-26: qty 10

## 2017-05-31 ENCOUNTER — Inpatient Hospital Stay: Payer: Medicare Other

## 2017-05-31 DIAGNOSIS — Z5111 Encounter for antineoplastic chemotherapy: Secondary | ICD-10-CM | POA: Diagnosis not present

## 2017-05-31 DIAGNOSIS — C184 Malignant neoplasm of transverse colon: Secondary | ICD-10-CM

## 2017-05-31 LAB — CBC WITH DIFFERENTIAL/PLATELET
BASOS ABS: 0 10*3/uL (ref 0–0.1)
BASOS PCT: 1 %
EOS ABS: 0.1 10*3/uL (ref 0–0.7)
EOS PCT: 2 %
HEMATOCRIT: 34 % — AB (ref 40.0–52.0)
Hemoglobin: 11.7 g/dL — ABNORMAL LOW (ref 13.0–18.0)
Lymphocytes Relative: 42 %
Lymphs Abs: 1.5 10*3/uL (ref 1.0–3.6)
MCH: 32.7 pg (ref 26.0–34.0)
MCHC: 34.3 g/dL (ref 32.0–36.0)
MCV: 95.3 fL (ref 80.0–100.0)
MONO ABS: 0.3 10*3/uL (ref 0.2–1.0)
MONOS PCT: 8 %
NEUTROS ABS: 1.7 10*3/uL (ref 1.4–6.5)
Neutrophils Relative %: 47 %
PLATELETS: 122 10*3/uL — AB (ref 150–440)
RBC: 3.57 MIL/uL — ABNORMAL LOW (ref 4.40–5.90)
RDW: 17.7 % — AB (ref 11.5–14.5)
WBC: 3.6 10*3/uL — ABNORMAL LOW (ref 3.8–10.6)

## 2017-06-07 ENCOUNTER — Other Ambulatory Visit: Payer: Self-pay

## 2017-06-07 ENCOUNTER — Other Ambulatory Visit: Payer: Self-pay | Admitting: Hematology and Oncology

## 2017-06-07 ENCOUNTER — Inpatient Hospital Stay: Payer: Medicare Other

## 2017-06-07 ENCOUNTER — Inpatient Hospital Stay (HOSPITAL_BASED_OUTPATIENT_CLINIC_OR_DEPARTMENT_OTHER): Payer: Medicare Other | Admitting: Hematology and Oncology

## 2017-06-07 VITALS — BP 118/68 | HR 98 | Temp 96.7°F | Resp 20 | Wt 275.0 lb

## 2017-06-07 DIAGNOSIS — C3412 Malignant neoplasm of upper lobe, left bronchus or lung: Secondary | ICD-10-CM

## 2017-06-07 DIAGNOSIS — C184 Malignant neoplasm of transverse colon: Secondary | ICD-10-CM | POA: Diagnosis not present

## 2017-06-07 DIAGNOSIS — D509 Iron deficiency anemia, unspecified: Secondary | ICD-10-CM | POA: Diagnosis not present

## 2017-06-07 DIAGNOSIS — Z7189 Other specified counseling: Secondary | ICD-10-CM

## 2017-06-07 DIAGNOSIS — Z5111 Encounter for antineoplastic chemotherapy: Secondary | ICD-10-CM | POA: Diagnosis not present

## 2017-06-07 DIAGNOSIS — R0602 Shortness of breath: Secondary | ICD-10-CM | POA: Diagnosis not present

## 2017-06-07 LAB — CBC WITH DIFFERENTIAL/PLATELET
Basophils Absolute: 0 10*3/uL (ref 0–0.1)
Basophils Relative: 1 %
Eosinophils Absolute: 0.1 10*3/uL (ref 0–0.7)
Eosinophils Relative: 2 %
HCT: 34.4 % — ABNORMAL LOW (ref 40.0–52.0)
Hemoglobin: 11.8 g/dL — ABNORMAL LOW (ref 13.0–18.0)
Lymphocytes Relative: 29 %
Lymphs Abs: 1.3 10*3/uL (ref 1.0–3.6)
MCH: 33.4 pg (ref 26.0–34.0)
MCHC: 34.2 g/dL (ref 32.0–36.0)
MCV: 97.6 fL (ref 80.0–100.0)
Monocytes Absolute: 0.5 10*3/uL (ref 0.2–1.0)
Monocytes Relative: 10 %
Neutro Abs: 2.7 10*3/uL (ref 1.4–6.5)
Neutrophils Relative %: 58 %
Platelets: 101 10*3/uL — ABNORMAL LOW (ref 150–440)
RBC: 3.52 MIL/uL — ABNORMAL LOW (ref 4.40–5.90)
RDW: 19.3 % — ABNORMAL HIGH (ref 11.5–14.5)
WBC: 4.5 10*3/uL (ref 3.8–10.6)

## 2017-06-07 LAB — COMPREHENSIVE METABOLIC PANEL
ALT: 37 U/L (ref 17–63)
AST: 41 U/L (ref 15–41)
Albumin: 3.5 g/dL (ref 3.5–5.0)
Alkaline Phosphatase: 60 U/L (ref 38–126)
Anion gap: 9 (ref 5–15)
BUN: 18 mg/dL (ref 6–20)
CO2: 22 mmol/L (ref 22–32)
Calcium: 9 mg/dL (ref 8.9–10.3)
Chloride: 105 mmol/L (ref 101–111)
Creatinine, Ser: 1.06 mg/dL (ref 0.61–1.24)
GFR calc Af Amer: >60 mL/min (ref >60)
GFR calc non Af Amer: >60 mL/min (ref >60)
Glucose, Bld: 275 mg/dL — ABNORMAL HIGH (ref 65–99)
Potassium: 4.4 mmol/L (ref 3.5–5.1)
Sodium: 136 mmol/L (ref 135–145)
Total Bilirubin: 0.8 mg/dL (ref 0.3–1.2)
Total Protein: 6.5 g/dL (ref 6.5–8.1)

## 2017-06-07 LAB — MAGNESIUM: Magnesium: 1.8 mg/dL (ref 1.7–2.4)

## 2017-06-07 MED ORDER — SODIUM CHLORIDE 0.9 % IV SOLN
2400.0000 mg/m2 | INTRAVENOUS | Status: DC
  Administered 2017-06-07: 5950 mg via INTRAVENOUS
  Filled 2017-06-07: qty 100

## 2017-06-07 MED ORDER — SODIUM CHLORIDE 0.9% FLUSH
10.0000 mL | INTRAVENOUS | Status: DC | PRN
  Administered 2017-06-07: 10 mL via INTRAVENOUS
  Filled 2017-06-07: qty 10

## 2017-06-07 MED ORDER — OXALIPLATIN CHEMO INJECTION 100 MG/20ML
200.0000 mg | Freq: Once | INTRAVENOUS | Status: AC
  Administered 2017-06-07: 200 mg via INTRAVENOUS
  Filled 2017-06-07: qty 40

## 2017-06-07 MED ORDER — DEXTROSE 5 % IV SOLN
Freq: Once | INTRAVENOUS | Status: AC
  Administered 2017-06-07: 11:00:00 via INTRAVENOUS
  Filled 2017-06-07: qty 1000

## 2017-06-07 MED ORDER — FLUOROURACIL CHEMO INJECTION 2.5 GM/50ML
400.0000 mg/m2 | Freq: Once | INTRAVENOUS | Status: AC
  Administered 2017-06-07: 1000 mg via INTRAVENOUS
  Filled 2017-06-07: qty 20

## 2017-06-07 MED ORDER — LEUCOVORIN CALCIUM INJECTION 350 MG
1000.0000 mg | Freq: Once | INTRAVENOUS | Status: AC
  Administered 2017-06-07: 1000 mg via INTRAVENOUS
  Filled 2017-06-07: qty 50

## 2017-06-07 MED ORDER — PALONOSETRON HCL INJECTION 0.25 MG/5ML
0.2500 mg | Freq: Once | INTRAVENOUS | Status: AC
  Administered 2017-06-07: 0.25 mg via INTRAVENOUS
  Filled 2017-06-07: qty 5

## 2017-06-07 MED ORDER — HEPARIN SOD (PORK) LOCK FLUSH 100 UNIT/ML IV SOLN
500.0000 [IU] | Freq: Once | INTRAVENOUS | Status: DC
  Filled 2017-06-07: qty 5

## 2017-06-07 MED ORDER — DEXAMETHASONE SODIUM PHOSPHATE 10 MG/ML IJ SOLN
10.0000 mg | Freq: Once | INTRAMUSCULAR | Status: AC
  Administered 2017-06-07: 10 mg via INTRAVENOUS
  Filled 2017-06-07: qty 1

## 2017-06-07 NOTE — Progress Notes (Signed)
Here for follow up. Intermittent sob per pt  Denies currently.. Pulse ox 98 % on RA  In NAD at this time.

## 2017-06-07 NOTE — Progress Notes (Signed)
[facility] day:  06/07/2017  Chief Complaint: Donald Quale. is a 72 y.o. male with stage IIIB colon cancer who is seen for assessment prior to cycle #7 FOLFOX chemotherapy.  HPI:   The patient was last seen in the medical oncology clinic on 05/24/2017.  At that time, he was doing well.  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  He denied nausea, vomiting, and diarrhea. Exam was stable.  WBC was 3900 with an [place] of 2200. Hemoglobin was 12.0, hematocrit 35.0, and platelets 110,000. Magnesium was normal at 1.8.  He received cycle #6 FOLFOX.  CBC on 05/31/2017 revealed a hematocrit of 34, hemoglobin 11.7, MCV 95.3, platelets 122,000, WBC 3600 with an ANC of 1700.  During the interim, patient has been doing well. Patient states, "I am short winded at times". Patient unable to identify precipitating factors. He notes that he was short of breath this morning in the shower. He denies nausea, vomiting, and diarrhea. 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 He notes that it is lasting longer following this cycle. The neuropathy does not limit his day to day function.   Patient denies any B symptoms or interval infections. Patient is eating well. His weight is stable. Patient denies pain in the clinic today.    Past Medical History:  Diagnosis Date  . Abdominal aortic atherosclerosis ([place]) 09/15/2016   CT scan June 2018  . Allergy   . Cancer ([place])   . Coronary artery calcification seen on CAT scan 09/15/2016   Previous smoker; noted on CT scan June 2018  . Diabetes mellitus without complication ([place])   . GERD (gastroesophageal reflux disease)   . Gout   . Hyperlipidemia   . Hypertension   . Kidney function abnormal    stage 2  . Kidney stones   . Obesity 08/20/2015    Past Surgical History:  Procedure Laterality Date  . COLONOSCOPY WITH PROPOFOL N/A 11/05/2016   Procedure: COLONOSCOPY  WITH PROPOFOL;  Surgeon: Lucilla Lame, MD;  Location: [facility];  Service: Gastroenterology;  Laterality: N/A;  . ELECTROMAGNETIC NAVIGATION BROCHOSCOPY N/A 12/16/2016   Procedure: ELECTROMAGNETIC NAVIGATION BRONCHOSCOPY;  Surgeon: Flora Lipps, MD;  Location: [facility];  Service: Cardiopulmonary;  Laterality: N/A;  . ESOPHAGOGASTRODUODENOSCOPY (EGD) WITH PROPOFOL N/A 11/05/2016   Procedure: ESOPHAGOGASTRODUODENOSCOPY (EGD) WITH PROPOFOL;  Surgeon: Lucilla Lame, MD;  Location: [facility];  Service: Gastroenterology;  Laterality: N/A;  Diabetic - oral meds  . kidney stones    . PARTIAL COLECTOMY N/A 12/28/2016   Procedure: PARTIAL COLECTOMY RIGHT;  Surgeon: Jules Husbands, MD;  Location: [facility];  Service: General;  Laterality: N/A;  . POLYPECTOMY N/A 11/05/2016   Procedure: POLYPECTOMY;  Surgeon: Lucilla Lame, MD;  Location: [facility];  Service: Gastroenterology;  Laterality: N/A;  . PORTACATH PLACEMENT Right 01/18/2017   Procedure: INSERTION PORT-A-CATH;  Surgeon: Jules Husbands, MD;  Location: [facility];  Service: General;  Laterality: Right;    Family History  Problem Relation Age of Onset  . Alzheimer's disease Mother   . Heart disease Father   . Diabetes Father   . Cancer Father        Prostate  . Cancer Sister        skin cancer    Social History:  reports that he quit smoking about a year ago. His smoking use included cigarettes. He has  never used smokeless tobacco. He reports that he does not drink alcohol or use drugs.  Patient worked for a [employer].  He retired in 1996.  He was in his 39s. Patient was a 1-2 pack per day smoker since he was a child.  He stopped smoking in 07/2016.  He drank alcohol in the past.  He has 3 living sisters (1 sister died).  He has no children. Patient married to [place].  Communicate with neighbor [name]  [phone]. The patient is alone today.   Allergies: No Known Allergies  Current Medications: Current  Outpatient Medications  Medication Sig Dispense Refill  . allopurinol (ZYLOPRIM) 300 MG tablet Take 1 tablet (300 mg total) by mouth daily. 90 tablet 3  . Cyanocobalamin (VITAMIN B-12 PO) Take 1 tablet by mouth daily.    Marland Kitchen GLIPIZIDE XL 10 MG 24 hr tablet TAKE 1 TABLET BY MOUTH TWICE DAILY 180 tablet 1  . glucose blood (ACCU-CHEK COMPACT PLUS) test strip Check blood sugars once a day if desired; LON 99 months; Dx E11.21 102 each 1  . glucose blood (ACCU-CHEK COMPACT STRIPS) test strip 100 each by Other route as needed for other. Use as instructed, check FSBS once a day; LON 99 months, E11.9 100 each 1  . lidocaine-prilocaine (EMLA) cream Apply to affected area once 30 g 3  . Magnesium Oxide 250 MG TABS Take 250 mg by mouth every morning.     . metFORMIN (GLUCOPHAGE) 1000 MG tablet Take 1 tablet (1,000 mg total) by mouth 2 (two) times daily with a meal. 180 tablet 1  . pantoprazole (PROTONIX) 20 MG tablet TAKE 1 TABLET BY MOUTH ONCE DAILY 90 tablet 0  . quinapril (ACCUPRIL) 5 MG tablet TAKE 1 TABLET BY MOUTH ONCE DAILY 30 tablet 5  . rosuvastatin (CRESTOR) 10 MG tablet Take 1 tablet (10 mg total) by mouth at bedtime. 90 tablet 1  . saw palmetto 160 MG capsule Take 160 mg 2 (two) times daily by mouth.    . tamsulosin (FLOMAX) 0.4 MG CAPS capsule Take 0.4 mg by mouth daily.    . mupirocin ointment (BACTROBAN) 2 %   0   No current facility-administered medications for this visit.    Facility-Administered Medications Ordered in Other Visits  Medication Dose Route Frequency Provider Last Rate Last Dose  . heparin lock flush 100 unit/mL  500 Units Intravenous Once Corcoran, Melissa C, MD      . sodium chloride flush (NS) 0.9 % injection 10 mL  10 mL Intravenous PRN Nolon Stalls C, MD   10 mL at 06/07/17 0900    Review of Systems:  GENERAL:  Feels "good".  No fevers or sweats.  Weight stable.  PERFORMANCE STATUS (ECOG):  1 HEENT:  No visual changes, runny nose, sore throat, mouth sores or  tenderness.  Dried blood in nose. Lungs: Shortness of breath at times.  No cough.  No pleuritic chest pain or hemoptysis. Cardiac: No chest pain, palpitations, orthopnea, or PND. GI:  No nausea, vomiting, constipation, or melena. GU:  No urgency, frequency, dysuria, or hematuria. Musculoskeletal:  No back pain.  No joint pain.  No muscle tenderness. Extremities:  No pain or swelling. Skin:  No rashes or skin changes. Neuro:  Cold neuropathy in 3rd-5th digits of right hand (lasting longer).  No headache, numbness or weakness, balance or coordination issues. Endocrine:  Diabetes.  No thyroid issues, hot flashes or night sweats. Psych:  No mood changes, depression or anxiety. Pain:  No focal pain. Review of systems:  All other systems reviewed and found to be negative.  Physical Exam: Blood pressure 118/68, pulse 98, temperature (!) 96.7 F (35.9 C), temperature source Tympanic, resp. rate 20, weight 275 lb (124.7 kg). GENERAL:  Well developed, well nourished, heavyset gentleman sitting comfortably in the exam room in no acute distress. MENTAL STATUS:  Alert and oriented to person, place and time. HEAD:  Thin gray hair.  Male pattern baldness.  Normocephalic, atraumatic, face symmetric, no Cushingoid features. EYES:  Glasses.  Blue eyes.  Pupils equal round and reactive to light and accomodation.  No conjunctivitis or scleral icterus. ENT:  Oropharynx clear without lesion.  Tongue normal. Mucous membranes moist.  RESPIRATORY:  Clear to auscultation without rales, wheezes or rhonchi. CARDIOVASCULAR:  Regular rate and rhythm without murmur, rub or gallop. ABDOMEN:  Fully round.  Soft, non-tender, with active bowel sounds, and no appreciable hepatosplenomegaly.  No masses. SKIN:  No rashes, ulcers or lesions. EXTREMITIES: No edema, no skin discoloration or tenderness.  No palpable cords. LYMPH NODES: No palpable cervical, supraclavicular, axillary or inguinal adenopathy  NEUROLOGICAL:  Unremarkable. PSYCH:  Appropriate.   Infusion on 06/07/2017  Component Date Value Ref Range Status  . Magnesium 06/07/2017 1.8  1.7 - 2.4 mg/dL Final   Performed at [facility], [address], [place], [place] [address]  . Sodium 06/07/2017 136  135 - 145 mmol/L Final  . Potassium 06/07/2017 4.4  3.5 - 5.1 mmol/L Final  . Chloride 06/07/2017 105  101 - 111 mmol/L Final  . CO2 06/07/2017 22  22 - 32 mmol/L Final  . Glucose, Bld 06/07/2017 275* 65 - 99 mg/dL Final  . BUN 06/07/2017 18  6 - 20 mg/dL Final  . Creatinine, Ser 06/07/2017 1.06  0.61 - 1.24 mg/dL Final  . Calcium 06/07/2017 9.0  8.9 - 10.3 mg/dL Final  . Total Protein 06/07/2017 6.5  6.5 - 8.1 g/dL Final  . Albumin 06/07/2017 3.5  3.5 - 5.0 g/dL Final  . AST 06/07/2017 41  15 - 41 U/L Final  . ALT 06/07/2017 37  17 - 63 U/L Final  . Alkaline Phosphatase 06/07/2017 60  38 - 126 U/L Final  . Total Bilirubin 06/07/2017 0.8  0.3 - 1.2 mg/dL Final  . GFR calc non Af Amer 06/07/2017 >60  >60 mL/min Final  . GFR calc Af Amer 06/07/2017 >60  >60 mL/min Final   Comment: (NOTE) The eGFR has been calculated using the CKD EPI equation. This calculation has not been validated in all clinical situations. eGFR's persistently <60 mL/min signify possible Chronic Kidney Disease.   Georgiann Hahn gap 06/07/2017 9  5 - 15 Final   Performed at [facility], [address] [place], [place] [address]  . WBC 06/07/2017 4.5  3.8 - 10.6 K/uL Final  . RBC 06/07/2017 3.52* 4.40 - 5.90 MIL/uL Final  . Hemoglobin 06/07/2017 11.8* 13.0 - 18.0 g/dL Final  . HCT 06/07/2017 34.4* 40.0 - 52.0 % Final  . MCV 06/07/2017 97.6  80.0 - 100.0 fL Final  . MCH 06/07/2017 33.4  26.0 - 34.0 pg Final  . MCHC 06/07/2017 34.2  32.0 - 36.0 g/dL Final  . RDW 06/07/2017 19.3* 11.5 - 14.5 % Final  . Platelets 06/07/2017 101* 150 - 440 K/uL Final  . Neutrophils Relative % 06/07/2017 58  % Final  . Neutro Abs 06/07/2017 2.7  1.4 - 6.5 K/uL Final  .  Lymphocytes Relative 06/07/2017 29  %  Final  . Lymphs Abs 06/07/2017 1.3  1.0 - 3.6 K/uL Final  . Monocytes Relative 06/07/2017 10  % Final  . Monocytes Absolute 06/07/2017 0.5  0.2 - 1.0 K/uL Final  . Eosinophils Relative 06/07/2017 2  % Final  . Eosinophils Absolute 06/07/2017 0.1  0 - 0.7 K/uL Final  . Basophils Relative 06/07/2017 1  % Final  . Basophils Absolute 06/07/2017 0.0  0 - 0.1 K/uL Final   Performed at [facility], [address] [place], [place] [address]    Assessment:  Donald Stucky. is a 72 y.o. male with stage IIIB transverse colon cancer s/p right partial colectomy on 12/28/2016.  Pathology revealed a 5.5 cm grade II invasive adenocarcinoma of the transverse colon, extending through the muscularis propria and into the adjacent adipose tissue, including the greater omentum. There was metastatic adenocarcinoma in multiple lymph nodes and tumor deposits, at least 5 involved with a total of at least 18 lymph nodes (5 of 18).  Pathologic stage was T3N2a.  CEA was 1.4 on 11/09/2016.  Abdomen and pelvic CT on 11/09/2016 revealed mid transverse colon segment of luminal narrowing with wall thickening compatible with colon cancer.  There was a single enlarged adjacent mesenteric lymph node and prominent subcentimeter lymph nodes anterior to the pancreas which may be metastatic.  There was no evidence of distant metastasis.  There was a left kidney lower pole complex cyst with thin internal septations (Bosniak IIF).  His prostate was enlarged and extended into the floor of bladder.  There was right kidney lower pole punctate nonobstructing nephrolithiasis.  Chest CT on 11/18/2016 revealed no metastatic disease.  There was abrupt termination of the left upper lobe beyond which the distal bronchus appeared dilated with mild branching into the lung parenchyma.  Imaging suggested a bronchocele in the medial aspect of the left upper lobe. Differential included an endobronchial  polyp, endobronchial tumor, bronchial stricture or aspirated foreign body.   Electromagnetic navigational bronchoscopy (ENB) on 12/16/2016 revealed non-small cell carcinoma, favor squamous cell lung carcinoma.  Tumor was positive for p40 and negative for cdx-2 and TTF-1.  He received SBRT to the left upper lobe of 5000 cGy in 5 fractions from 02/16/2017 - 03/02/2017.  PET scan on 01/26/2017 revealed a 2.4 x 0.9 cm hypermetabolic medial left upper lobe pulmonary nodule, corresponding to known primary bronchogenic neoplasm. No findings suspicious for metastatic disease.  EGD on 11/05/2016 revealed gastritis.  Pathology confirmed chronic mild gastritis and features of a healing erosion in the stomach.  There was no H pylori, dysplasia or malignancy.  Echocardiogram  On 12/15/2016 revealed mild to moderate aortic valve stenosis. There was abnormal LV relaxation consistent with grade I diastolic dysfunction. Estimated EF was 55-60%.  He is s/p 6 cycles of FOLFOX chemotherapy (03/15/2017 - 05/24/2017).  He has tolerated chemotherapy well.  He has iron deficiency anemia.  He is on oral iron.  Ferritin was 6 on 09/04/2016.  Hematocrit was 29.7 and hemoglobin 8.8 on 09/04/2016.  Hematocrit was 28.1, hemoglobin 9.3, and MCV 87.9 on 11/09/2016.   Ferritin has been followed: 28 on 10/13/2016, 15 on 01/14/2017, 21 on 03/29/2016, 20 on 04/12/2017, and 24 on 021/01/2018.  Symptomatically, he is doing well.  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  He is experiencing intermittent episodes of shortness of breath. He denies nausea, vomiting, and diarrhea.  Exam is stable.  WBC is 4500 with an [place] of 2700. Hemoglobin is 11.8,  hematocrit 34.4, and platelets 101,000. Mg normal at 1.8.  Plan: 1.  Labs today: CBC with diff, CMP, Mg. 2.  Labs reviewed. Blood counts stable and adequate for treatment. Proceed with cycle # 7 FOLFOX chemotherapy. 3.  Discuss plan for interval chest imaging  on 07/09/2017 and follow up with radiation oncology on 07/14/2017. 4.  RTC in 2 days for pump disconnect. 5.  RTC in 1 week for labs (CBC with diff). 6.  RTC in 2 weeks for MD assessment, labs (CBC with diff, CMP, Mg), and cycle #8 FOLFOX chemotherapy.   [name], NP  06/07/2017, 9:53 AM  I saw and evaluated the patient, participating in the key portions of the service and reviewing pertinent diagnostic studies and records.  I reviewed the nurse practitioner's note and agree with the findings and the plan.  The assessment and plan were discussed with the patient.  A few questions were asked by the patient and answered.   Nolon Stalls, MD 06/07/2017,9:53 AM

## 2017-06-09 ENCOUNTER — Inpatient Hospital Stay: Payer: Medicare Other

## 2017-06-09 VITALS — BP 137/63 | HR 102 | Temp 96.8°F | Resp 20

## 2017-06-09 DIAGNOSIS — C184 Malignant neoplasm of transverse colon: Secondary | ICD-10-CM

## 2017-06-09 DIAGNOSIS — Z5111 Encounter for antineoplastic chemotherapy: Secondary | ICD-10-CM | POA: Diagnosis not present

## 2017-06-09 MED ORDER — HEPARIN SOD (PORK) LOCK FLUSH 100 UNIT/ML IV SOLN
500.0000 [IU] | Freq: Once | INTRAVENOUS | Status: AC | PRN
  Administered 2017-06-09: 500 [IU]
  Filled 2017-06-09: qty 5

## 2017-06-09 MED ORDER — SODIUM CHLORIDE 0.9% FLUSH
10.0000 mL | INTRAVENOUS | Status: DC | PRN
  Administered 2017-06-09: 10 mL
  Filled 2017-06-09: qty 10

## 2017-06-13 ENCOUNTER — Encounter: Payer: Self-pay | Admitting: Hematology and Oncology

## 2017-06-14 ENCOUNTER — Inpatient Hospital Stay: Payer: Medicare Other | Attending: Hematology and Oncology

## 2017-06-14 DIAGNOSIS — C3412 Malignant neoplasm of upper lobe, left bronchus or lung: Secondary | ICD-10-CM | POA: Insufficient documentation

## 2017-06-14 DIAGNOSIS — R0602 Shortness of breath: Secondary | ICD-10-CM | POA: Insufficient documentation

## 2017-06-14 DIAGNOSIS — Z452 Encounter for adjustment and management of vascular access device: Secondary | ICD-10-CM | POA: Insufficient documentation

## 2017-06-14 DIAGNOSIS — D509 Iron deficiency anemia, unspecified: Secondary | ICD-10-CM | POA: Insufficient documentation

## 2017-06-14 DIAGNOSIS — G629 Polyneuropathy, unspecified: Secondary | ICD-10-CM | POA: Insufficient documentation

## 2017-06-14 DIAGNOSIS — Z5111 Encounter for antineoplastic chemotherapy: Secondary | ICD-10-CM | POA: Diagnosis not present

## 2017-06-14 DIAGNOSIS — C184 Malignant neoplasm of transverse colon: Secondary | ICD-10-CM | POA: Insufficient documentation

## 2017-06-14 LAB — CBC WITH DIFFERENTIAL/PLATELET
Basophils Absolute: 0 10*3/uL (ref 0–0.1)
Basophils Relative: 1 %
Eosinophils Absolute: 0.1 10*3/uL (ref 0–0.7)
Eosinophils Relative: 2 %
HCT: 35.5 % — ABNORMAL LOW (ref 40.0–52.0)
Hemoglobin: 12.1 g/dL — ABNORMAL LOW (ref 13.0–18.0)
Lymphocytes Relative: 33 %
Lymphs Abs: 1.1 10*3/uL (ref 1.0–3.6)
MCH: 33.9 pg (ref 26.0–34.0)
MCHC: 34.1 g/dL (ref 32.0–36.0)
MCV: 99.3 fL (ref 80.0–100.0)
Monocytes Absolute: 0.3 10*3/uL (ref 0.2–1.0)
Monocytes Relative: 9 %
Neutro Abs: 1.9 10*3/uL (ref 1.4–6.5)
Neutrophils Relative %: 55 %
Platelets: 115 10*3/uL — ABNORMAL LOW (ref 150–440)
RBC: 3.57 MIL/uL — ABNORMAL LOW (ref 4.40–5.90)
RDW: 18.7 % — ABNORMAL HIGH (ref 11.5–14.5)
WBC: 3.4 10*3/uL — ABNORMAL LOW (ref 3.8–10.6)

## 2017-06-19 NOTE — Progress Notes (Signed)
[facility] day:  06/21/2017  Chief Complaint: Donald Macomber. is a 72 y.o. male with stage IIIB colon cancer who is seen for assessment prior to cycle #8 FOLFOX chemotherapy.  HPI:   The patient was last seen in the medical oncology clinic on 06/07/2017.  At that time, patient complained of being "short winded" at times. 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 Neuropathy was not reported to limit day to day function.  Exam was stable. WBC was 4500 with an Morrison of 2700. He received cycle #7 FOLFOX chemotherapy.   Patient is scheduled for interval chest CT on 07/09/2017, and will follow up with radiation oncology on 07/14/2017.   During the interim, patient is doing well. He has no acute concerns. Patient feels "pretty good". Patient continues to have exertional dyspnea. He denies nausea, vomiting, and diarrhea. 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 Patient states, "it is lasting longer". Patient's function is not limited. Patient denies B symptoms and interval infections.   Patient eating well. His weight is down 3 pounds. Patient denies pain.    Past Medical History:  Diagnosis Date  . Abdominal aortic atherosclerosis ([place]) 09/15/2016   CT scan June 2018  . Allergy   . Cancer ([place])   . Coronary artery calcification seen on CAT scan 09/15/2016   Previous smoker; noted on CT scan June 2018  . Diabetes mellitus without complication ([place])   . GERD (gastroesophageal reflux disease)   . Gout   . Hyperlipidemia   . Hypertension   . Kidney function abnormal    stage 2  . Kidney stones   . Obesity 08/20/2015    Past Surgical History:  Procedure Laterality Date  . COLONOSCOPY WITH PROPOFOL N/A 11/05/2016   Procedure: COLONOSCOPY WITH PROPOFOL;  Surgeon: Lucilla Lame, MD;  Location: [facility];  Service: Gastroenterology;  Laterality: N/A;  . ELECTROMAGNETIC NAVIGATION BROCHOSCOPY N/A  12/16/2016   Procedure: ELECTROMAGNETIC NAVIGATION BRONCHOSCOPY;  Surgeon: Flora Lipps, MD;  Location: [facility];  Service: Cardiopulmonary;  Laterality: N/A;  . ESOPHAGOGASTRODUODENOSCOPY (EGD) WITH PROPOFOL N/A 11/05/2016   Procedure: ESOPHAGOGASTRODUODENOSCOPY (EGD) WITH PROPOFOL;  Surgeon: Lucilla Lame, MD;  Location: [facility];  Service: Gastroenterology;  Laterality: N/A;  Diabetic - oral meds  . kidney stones    . PARTIAL COLECTOMY N/A 12/28/2016   Procedure: PARTIAL COLECTOMY RIGHT;  Surgeon: Jules Husbands, MD;  Location: [facility];  Service: General;  Laterality: N/A;  . POLYPECTOMY N/A 11/05/2016   Procedure: POLYPECTOMY;  Surgeon: Lucilla Lame, MD;  Location: [facility];  Service: Gastroenterology;  Laterality: N/A;  . PORTACATH PLACEMENT Right 01/18/2017   Procedure: INSERTION PORT-A-CATH;  Surgeon: Jules Husbands, MD;  Location: [facility];  Service: General;  Laterality: Right;    Family History  Problem Relation Age of Onset  . Alzheimer's disease Mother   . Heart disease Father   . Diabetes Father   . Cancer Father        Prostate  . Cancer Sister        skin cancer    Social History:  reports that he quit smoking about a year ago. His smoking use included cigarettes. He has never used smokeless tobacco. He reports that he does not drink alcohol or use drugs.  Patient worked for a [employer].  He retired in 1996.  He was in his 64s. Patient was a  1-2 pack per day smoker since he was a child.  He stopped smoking in 07/2016.  He drank alcohol in the past.  He has 3 living sisters (1 sister died).  He has no children. Patient married to Donald Mcdowell.  Communicate with neighbor [name]  [phone]. The patient is alone today.   Allergies: No Known Allergies  Current Medications: Current Outpatient Medications  Medication Sig Dispense Refill  . allopurinol (ZYLOPRIM) 300 MG tablet Take 1 tablet (300 mg total) by mouth daily. 90 tablet 3  .  Cyanocobalamin (VITAMIN B-12 PO) Take 1 tablet by mouth daily.    Marland Kitchen GLIPIZIDE XL 10 MG 24 hr tablet TAKE 1 TABLET BY MOUTH TWICE DAILY 180 tablet 1  . glucose blood (ACCU-CHEK COMPACT PLUS) test strip Check blood sugars once a day if desired; LON 99 months; Dx E11.21 102 each 1  . glucose blood (ACCU-CHEK COMPACT STRIPS) test strip 100 each by Other route as needed for other. Use as instructed, check FSBS once a day; LON 99 months, E11.9 100 each 1  . lidocaine-prilocaine (EMLA) cream Apply to affected area once 30 g 3  . Magnesium Oxide 250 MG TABS Take 250 mg by mouth every morning.     . metFORMIN (GLUCOPHAGE) 1000 MG tablet Take 1 tablet (1,000 mg total) by mouth 2 (two) times daily with a meal. 180 tablet 1  . mupirocin ointment (BACTROBAN) 2 %   0  . pantoprazole (PROTONIX) 20 MG tablet TAKE 1 TABLET BY MOUTH ONCE DAILY 90 tablet 0  . quinapril (ACCUPRIL) 5 MG tablet TAKE 1 TABLET BY MOUTH ONCE DAILY 30 tablet 5  . rosuvastatin (CRESTOR) 10 MG tablet Take 1 tablet (10 mg total) by mouth at bedtime. 90 tablet 1  . saw palmetto 160 MG capsule Take 160 mg 2 (two) times daily by mouth.    . tamsulosin (FLOMAX) 0.4 MG CAPS capsule Take 0.4 mg by mouth daily.     No current facility-administered medications for this visit.    Facility-Administered Medications Ordered in Other Visits  Medication Dose Route Frequency Provider Last Rate Last Dose  . heparin lock flush 100 unit/mL  500 Units Intravenous Once Lequita Asal, MD        Review of Systems:  GENERAL:  Feels "pretty good".  No fevers or sweats.  Weight down 3 pounds.   PERFORMANCE STATUS (ECOG):  1 HEENT:  No visual changes, runny nose, sore throat, mouth sores or tenderness.  Dried blood in nose. Lungs: Shortness of breath at times.  No cough.  No pleuritic chest pain or hemoptysis. Cardiac: No chest pain, palpitations, orthopnea, or PND. GI:  No nausea, vomiting, constipation, or melena. GU:  No urgency, frequency, dysuria,  or hematuria. Musculoskeletal:  No back pain.  No joint pain.  No muscle tenderness. Extremities:  No pain or swelling. Skin:  No rashes or skin changes. Neuro:  Cold neuropathy in 3rd-5th digits of right hand (lasting longer).  No headache, numbness or weakness, balance or coordination issues. Endocrine:  Diabetes.  No thyroid issues, hot flashes or night sweats. Psych:  No mood changes, depression or anxiety.  Difficulty sleeping. Pain:  No focal pain. Review of systems:  All other systems reviewed and found to be negative.  Physical Exam: Blood pressure 138/71, pulse 98, temperature (!) 96.4 F (35.8 C), temperature source Tympanic, resp. rate 18, weight 272 lb 7 oz (123.6 kg). GENERAL:  Well developed, well nourished, heavyset gentleman sitting comfortably in the  exam room in no acute distress. MENTAL STATUS:  Alert and oriented to person, place and time. HEAD:  Thin gray hair.  Male pattern baldness.  Normocephalic, atraumatic, face symmetric, no Cushingoid features. EYES:  Glasses.  Blue eyes.  Pupils equal round and reactive to light and accomodation.  No conjunctivitis or scleral icterus. ENT:  Oropharynx clear without lesion.  Tongue normal. Mucous membranes moist.  RESPIRATORY:  Clear to auscultation without rales, wheezes or rhonchi. CARDIOVASCULAR:  Regular rate and rhythm without murmur, rub or gallop. ABDOMEN:  Fully round.  Soft, non-tender, with active bowel sounds, and no appreciable hepatosplenomegaly.  No masses. SKIN:  No rashes, ulcers or lesions. EXTREMITIES: No edema, no skin discoloration or tenderness.  No palpable cords. LYMPH NODES: No palpable cervical, supraclavicular, axillary or inguinal adenopathy  NEUROLOGICAL: Unremarkable. PSYCH:  Appropriate.   Infusion on 06/21/2017  Component Date Value Ref Range Status  . Magnesium 06/21/2017 1.6* 1.7 - 2.4 mg/dL Final   Performed at [facility], [address], [place], [place] [address]  . Sodium  06/21/2017 136  135 - 145 mmol/L Final  . Potassium 06/21/2017 4.1  3.5 - 5.1 mmol/L Final  . Chloride 06/21/2017 107  101 - 111 mmol/L Final  . CO2 06/21/2017 21* 22 - 32 mmol/L Final  . Glucose, Bld 06/21/2017 304* 65 - 99 mg/dL Final  . BUN 06/21/2017 20  6 - 20 mg/dL Final  . Creatinine, Ser 06/21/2017 0.98  0.61 - 1.24 mg/dL Final  . Calcium 06/21/2017 9.1  8.9 - 10.3 mg/dL Final  . Total Protein 06/21/2017 6.3* 6.5 - 8.1 g/dL Final  . Albumin 06/21/2017 3.5  3.5 - 5.0 g/dL Final  . AST 06/21/2017 34  15 - 41 U/L Final  . ALT 06/21/2017 33  17 - 63 U/L Final  . Alkaline Phosphatase 06/21/2017 58  38 - 126 U/L Final  . Total Bilirubin 06/21/2017 0.5  0.3 - 1.2 mg/dL Final  . GFR calc non Af Amer 06/21/2017 >60  >60 mL/min Final  . GFR calc Af Amer 06/21/2017 >60  >60 mL/min Final   Comment: (NOTE) The eGFR has been calculated using the CKD EPI equation. This calculation has not been validated in all clinical situations. eGFR's persistently <60 mL/min signify possible Chronic Kidney Disease.   Georgiann Hahn gap 06/21/2017 8  5 - 15 Final   Performed at [facility], [address] [place], [place] [address]  . WBC 06/21/2017 3.9  3.8 - 10.6 K/uL Final  . RBC 06/21/2017 3.44* 4.40 - 5.90 MIL/uL Final  . Hemoglobin 06/21/2017 11.8* 13.0 - 18.0 g/dL Final  . HCT 06/21/2017 34.3* 40.0 - 52.0 % Final  . MCV 06/21/2017 99.8  80.0 - 100.0 fL Final  . MCH 06/21/2017 34.3* 26.0 - 34.0 pg Final  . MCHC 06/21/2017 34.3  32.0 - 36.0 g/dL Final  . RDW 06/21/2017 19.5* 11.5 - 14.5 % Final  . Platelets 06/21/2017 95* 150 - 440 K/uL Final  . Neutrophils Relative % 06/21/2017 61  % Final  . Neutro Abs 06/21/2017 2.4  1.4 - 6.5 K/uL Final  . Lymphocytes Relative 06/21/2017 27  % Final  . Lymphs Abs 06/21/2017 1.1  1.0 - 3.6 K/uL Final  . Monocytes Relative 06/21/2017 9  % Final  . Monocytes Absolute 06/21/2017 0.3  0.2 - 1.0 K/uL Final  . Eosinophils Relative 06/21/2017 2  % Final  .  Eosinophils Absolute 06/21/2017 0.1  0 - 0.7 K/uL Final  .  Basophils Relative 06/21/2017 1  % Final  . Basophils Absolute 06/21/2017 0.0  0 - 0.1 K/uL Final   Performed at [facility], [address] [place], [place] [address]    Assessment:  Donald Ramroop. is a 72 y.o. male with stage IIIB transverse colon cancer s/p right partial colectomy on 12/28/2016.  Pathology revealed a 5.5 cm grade II invasive adenocarcinoma of the transverse colon, extending through the muscularis propria and into the adjacent adipose tissue, including the greater omentum. There was metastatic adenocarcinoma in multiple lymph nodes and tumor deposits, at least 5 involved with a total of at least 18 lymph nodes (5 of 18).  Pathologic stage was T3N2a.  CEA was 1.4 on 11/09/2016.  Abdomen and pelvic CT on 11/09/2016 revealed mid transverse colon segment of luminal narrowing with wall thickening compatible with colon cancer.  There was a single enlarged adjacent mesenteric lymph node and prominent subcentimeter lymph nodes anterior to the pancreas which may be metastatic.  There was no evidence of distant metastasis.  There was a left kidney lower pole complex cyst with thin internal septations (Bosniak IIF).  His prostate was enlarged and extended into the floor of bladder.  There was right kidney lower pole punctate nonobstructing nephrolithiasis.  Chest CT on 11/18/2016 revealed no metastatic disease.  There was abrupt termination of the left upper lobe beyond which the distal bronchus appeared dilated with mild branching into the lung parenchyma.  Imaging suggested a bronchocele in the medial aspect of the left upper lobe. Differential included an endobronchial polyp, endobronchial tumor, bronchial stricture or aspirated foreign body.   Electromagnetic navigational bronchoscopy (ENB) on 12/16/2016 revealed non-small cell carcinoma, favor squamous cell lung carcinoma.  Tumor was positive for p40 and negative for  cdx-2 and TTF-1.  He received SBRT to the left upper lobe of 5000 cGy in 5 fractions from 02/16/2017 - 03/02/2017.  PET scan on 01/26/2017 revealed a 2.4 x 0.9 cm hypermetabolic medial left upper lobe pulmonary nodule, corresponding to known primary bronchogenic neoplasm. No findings suspicious for metastatic disease.  EGD on 11/05/2016 revealed gastritis.  Pathology confirmed chronic mild gastritis and features of a healing erosion in the stomach.  There was no H pylori, dysplasia or malignancy.  Echocardiogram  On 12/15/2016 revealed mild to moderate aortic valve stenosis. There was abnormal LV relaxation consistent with grade I diastolic dysfunction. Estimated EF was 55-60%.  He is s/p 7 cycles of FOLFOX chemotherapy (03/15/2017 - 06/07/2017).  He has tolerated chemotherapy well.  He has iron deficiency anemia.  He is on oral iron.  Ferritin was 6 on 09/04/2016.  Hematocrit was 29.7 and hemoglobin 8.8 on 09/04/2016.  Hematocrit was 28.1, hemoglobin 9.3, and MCV 87.9 on 11/09/2016.   Ferritin has been followed: 28 on 10/13/2016, 15 on 01/14/2017, 21 on 03/29/2016, 20 on 04/12/2017, and 24 on 0/01/2018.  Symptomatically, he is doing well.  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  He is experiencing intermittent episodes of shortness of breath. He denies nausea, vomiting, and diarrhea.  Exam is stable.  WBC is 3900 with an ANC of 2400. Hemoglobin is 11.8, hematocrit 34.3, and platelets 95,000. Magnesium is 1.6 (low).  Plan: 1.  Labs today: CBC with diff, CMP, Mg. 2.  Labs reviewed. Platelets low at 95,000. Hold cycle # 8 FOLFOX chemotherapy. 3.  Discuss plan for interval chest imaging on 07/09/2017 and follow up with radiation oncology on 07/14/2017. 4.  Discuss HYPERglycemia. Will  communicate with PCP.  5.  Discuss low magnesium. Magnesium 1.6 today. Will increase magnesium oxide to 400 mg daily.  6.  RTC in 1 week for MD assessment,  labs (CBC with diff, CMP,  MG), and cycle #8 FOLFOX chemotherapy.   [name], NP  06/21/2017, 9:27 AM  I saw and evaluated the patient, participating in the key portions of the service and reviewing pertinent diagnostic studies and records.  I reviewed the nurse practitioner's note and agree with the findings and the plan.  The assessment and plan were discussed with the patient.  A few questions were asked by the patient and answered.   Nolon Stalls, MD 06/21/2017,9:27 AM

## 2017-06-21 ENCOUNTER — Inpatient Hospital Stay: Payer: Medicare Other

## 2017-06-21 ENCOUNTER — Other Ambulatory Visit: Payer: Self-pay | Admitting: Hematology and Oncology

## 2017-06-21 ENCOUNTER — Encounter: Payer: Self-pay | Admitting: Hematology and Oncology

## 2017-06-21 ENCOUNTER — Inpatient Hospital Stay (HOSPITAL_BASED_OUTPATIENT_CLINIC_OR_DEPARTMENT_OTHER): Payer: Medicare Other | Admitting: Hematology and Oncology

## 2017-06-21 VITALS — BP 138/71 | HR 98 | Temp 96.4°F | Resp 18 | Wt 272.4 lb

## 2017-06-21 DIAGNOSIS — C3412 Malignant neoplasm of upper lobe, left bronchus or lung: Secondary | ICD-10-CM

## 2017-06-21 DIAGNOSIS — D509 Iron deficiency anemia, unspecified: Secondary | ICD-10-CM

## 2017-06-21 DIAGNOSIS — R0602 Shortness of breath: Secondary | ICD-10-CM

## 2017-06-21 DIAGNOSIS — C184 Malignant neoplasm of transverse colon: Secondary | ICD-10-CM | POA: Diagnosis not present

## 2017-06-21 DIAGNOSIS — Z7189 Other specified counseling: Secondary | ICD-10-CM

## 2017-06-21 DIAGNOSIS — D696 Thrombocytopenia, unspecified: Secondary | ICD-10-CM

## 2017-06-21 DIAGNOSIS — Z5111 Encounter for antineoplastic chemotherapy: Secondary | ICD-10-CM | POA: Diagnosis not present

## 2017-06-21 DIAGNOSIS — G629 Polyneuropathy, unspecified: Secondary | ICD-10-CM | POA: Diagnosis not present

## 2017-06-21 LAB — CBC WITH DIFFERENTIAL/PLATELET
Basophils Absolute: 0 10*3/uL (ref 0–0.1)
Basophils Relative: 1 %
Eosinophils Absolute: 0.1 10*3/uL (ref 0–0.7)
Eosinophils Relative: 2 %
HCT: 34.3 % — ABNORMAL LOW (ref 40.0–52.0)
Hemoglobin: 11.8 g/dL — ABNORMAL LOW (ref 13.0–18.0)
Lymphocytes Relative: 27 %
Lymphs Abs: 1.1 10*3/uL (ref 1.0–3.6)
MCH: 34.3 pg — ABNORMAL HIGH (ref 26.0–34.0)
MCHC: 34.3 g/dL (ref 32.0–36.0)
MCV: 99.8 fL (ref 80.0–100.0)
Monocytes Absolute: 0.3 10*3/uL (ref 0.2–1.0)
Monocytes Relative: 9 %
Neutro Abs: 2.4 10*3/uL (ref 1.4–6.5)
Neutrophils Relative %: 61 %
Platelets: 95 10*3/uL — ABNORMAL LOW (ref 150–440)
RBC: 3.44 MIL/uL — ABNORMAL LOW (ref 4.40–5.90)
RDW: 19.5 % — ABNORMAL HIGH (ref 11.5–14.5)
WBC: 3.9 10*3/uL (ref 3.8–10.6)

## 2017-06-21 LAB — COMPREHENSIVE METABOLIC PANEL
ALT: 33 U/L (ref 17–63)
AST: 34 U/L (ref 15–41)
Albumin: 3.5 g/dL (ref 3.5–5.0)
Alkaline Phosphatase: 58 U/L (ref 38–126)
Anion gap: 8 (ref 5–15)
BUN: 20 mg/dL (ref 6–20)
CO2: 21 mmol/L — ABNORMAL LOW (ref 22–32)
Calcium: 9.1 mg/dL (ref 8.9–10.3)
Chloride: 107 mmol/L (ref 101–111)
Creatinine, Ser: 0.98 mg/dL (ref 0.61–1.24)
GFR calc Af Amer: >60 mL/min (ref >60)
GFR calc non Af Amer: >60 mL/min (ref >60)
Glucose, Bld: 304 mg/dL — ABNORMAL HIGH (ref 65–99)
Potassium: 4.1 mmol/L (ref 3.5–5.1)
Sodium: 136 mmol/L (ref 135–145)
Total Bilirubin: 0.5 mg/dL (ref 0.3–1.2)
Total Protein: 6.3 g/dL — ABNORMAL LOW (ref 6.5–8.1)

## 2017-06-21 LAB — MAGNESIUM: Magnesium: 1.6 mg/dL — ABNORMAL LOW (ref 1.7–2.4)

## 2017-06-21 MED ORDER — HEPARIN SOD (PORK) LOCK FLUSH 100 UNIT/ML IV SOLN: 500.0000 [IU] | Freq: Once | INTRAVENOUS | Status: AC

## 2017-06-21 MED ORDER — SODIUM CHLORIDE 0.9% FLUSH
10.0000 mL | Freq: Once | INTRAVENOUS | Status: AC
  Administered 2017-06-21: 10 mL via INTRAVENOUS
  Filled 2017-06-21: qty 10

## 2017-06-21 MED ORDER — MAGNESIUM OXIDE 400 MG PO TABS: 400.0000 mg | ORAL_TABLET | Freq: Every day | ORAL | 1 refills | Status: DC

## 2017-06-21 NOTE — Progress Notes (Signed)
Patient offers no complaints today. 

## 2017-06-26 NOTE — Progress Notes (Signed)
[facility] day:  06/28/2017  Chief Complaint: Donald Kober. is a 72 y.o. male with stage IIIB colon cancer who is seen for assessment prior to cycle #8 FOLFOX chemotherapy.  HPI:   The patient was last seen in the medical oncology clinic on 06/21/2017.  At that time, he was doing well. 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 He denied functional limitations. Patient had intermittent episodes of shortness of breath. Exam was stable. WBC is 3900 with an ANC of 2400. Platelets 95,000. Magnesium was low at 1.6. Oral magnesium was increased to 400 mg daily (was on 250 mg). Chemotherapy held due to thrombocytopenia.   Symptomatically, patient has more symptoms following his last cycle of chemotherapy. Patient notes that he is "walking differently". He states, "people tell me I am walking like an old man. I am not walking like I used to". Patient denies balance or coordination issues. He denies nausea, vomiting, and significant bowel changes. Patient notes memory changes. He states, "I have a bad case of CRS (can't remember shit)". Patient has continued cold neuropathy in his fingers. He denies neuropathy in his toes.   Patient is eating well. His weight is up 3 pounds. Patient denies pain in the clinic today.    Past Medical History:  Diagnosis Date  . Abdominal aortic atherosclerosis ([place]) 09/15/2016   CT scan June 2018  . Allergy   . Cancer ([place])   . Coronary artery calcification seen on CAT scan 09/15/2016   Previous smoker; noted on CT scan June 2018  . Diabetes mellitus without complication ([place])   . GERD (gastroesophageal reflux disease)   . Gout   . Hyperlipidemia   . Hypertension   . Kidney function abnormal    stage 2  . Kidney stones   . Obesity 08/20/2015    Past Surgical History:  Procedure Laterality Date  . COLONOSCOPY WITH PROPOFOL N/A 11/05/2016   Procedure: COLONOSCOPY WITH PROPOFOL;   Surgeon: Lucilla Lame, MD;  Location: [place];  Service: Gastroenterology;  Laterality: N/A;  . ELECTROMAGNETIC NAVIGATION BROCHOSCOPY N/A 12/16/2016   Procedure: ELECTROMAGNETIC NAVIGATION BRONCHOSCOPY;  Surgeon: Flora Lipps, MD;  Location: [facility];  Service: Cardiopulmonary;  Laterality: N/A;  . ESOPHAGOGASTRODUODENOSCOPY (EGD) WITH PROPOFOL N/A 11/05/2016   Procedure: ESOPHAGOGASTRODUODENOSCOPY (EGD) WITH PROPOFOL;  Surgeon: Lucilla Lame, MD;  Location: [place];  Service: Gastroenterology;  Laterality: N/A;  Diabetic - oral meds  . kidney stones    . PARTIAL COLECTOMY N/A 12/28/2016   Procedure: PARTIAL COLECTOMY RIGHT;  Surgeon: Jules Husbands, MD;  Location: [facility];  Service: General;  Laterality: N/A;  . POLYPECTOMY N/A 11/05/2016   Procedure: POLYPECTOMY;  Surgeon: Lucilla Lame, MD;  Location: [place];  Service: Gastroenterology;  Laterality: N/A;  . PORTACATH PLACEMENT Right 01/18/2017   Procedure: INSERTION PORT-A-CATH;  Surgeon: Jules Husbands, MD;  Location: [facility];  Service: General;  Laterality: Right;    Family History  Problem Relation Age of Onset  . Alzheimer's disease Mother   . Heart disease Father   . Diabetes Father   . Cancer Father        Prostate  . Cancer Sister        skin cancer    Social History:  reports that he quit smoking about a year ago. His smoking use included cigarettes. He has never used smokeless tobacco. He reports that he does not drink alcohol  or use drugs.  Patient worked for a [employer].  He retired in 1996.  He was in his 8s. Patient was a 1-2 pack per day smoker since he was a child.  He stopped smoking in 07/2016.  He drank alcohol in the past.  He has 3 living sisters (1 sister died).  He has no children. Patient married to Rudolph.  Communicate with neighbor [name]  [phone]. The patient is alone today.   Allergies: No Known Allergies  Current Medications: Current Outpatient  Medications  Medication Sig Dispense Refill  . allopurinol (ZYLOPRIM) 300 MG tablet Take 1 tablet (300 mg total) by mouth daily. 90 tablet 3  . Cyanocobalamin (VITAMIN B-12 PO) Take 1 tablet by mouth daily.    Marland Kitchen GLIPIZIDE XL 10 MG 24 hr tablet TAKE 1 TABLET BY MOUTH TWICE DAILY 180 tablet 1  . glucose blood (ACCU-CHEK COMPACT PLUS) test strip Check blood sugars once a day if desired; LON 99 months; Dx E11.21 102 each 1  . glucose blood (ACCU-CHEK COMPACT STRIPS) test strip 100 each by Other route as needed for other. Use as instructed, check FSBS once a day; LON 99 months, E11.9 100 each 1  . lidocaine-prilocaine (EMLA) cream Apply to affected area once 30 g 3  . magnesium oxide (MAG-OX) 400 MG tablet Take 1 tablet (400 mg total) by mouth daily. 90 tablet 1  . metFORMIN (GLUCOPHAGE) 1000 MG tablet Take 1 tablet (1,000 mg total) by mouth 2 (two) times daily with a meal. 180 tablet 1  . pantoprazole (PROTONIX) 20 MG tablet TAKE 1 TABLET BY MOUTH ONCE DAILY 90 tablet 0  . quinapril (ACCUPRIL) 5 MG tablet TAKE 1 TABLET BY MOUTH ONCE DAILY 30 tablet 5  . rosuvastatin (CRESTOR) 10 MG tablet Take 1 tablet (10 mg total) by mouth at bedtime. 90 tablet 1  . saw palmetto 160 MG capsule Take 160 mg 2 (two) times daily by mouth.    . tamsulosin (FLOMAX) 0.4 MG CAPS capsule Take 0.4 mg by mouth daily.    . mupirocin ointment (BACTROBAN) 2 %   0   No current facility-administered medications for this visit.    Facility-Administered Medications Ordered in Other Visits  Medication Dose Route Frequency Provider Last Rate Last Dose  . heparin lock flush 100 unit/mL  500 Units Intravenous Once Corcoran, Melissa C, MD      . heparin lock flush 100 unit/mL  500 Units Intravenous Once Corcoran, Melissa C, MD      . sodium chloride flush (NS) 0.9 % injection 10 mL  10 mL Intravenous PRN Lequita Asal, MD   10 mL at 06/28/17 8756    Review of Systems:  GENERAL:  Feels tired all of the time.  No fevers,  sweats or weight loss.  Weight up 3 pounds. PERFORMANCE STATUS (ECOG):  1 HEENT:  Change in taste.  No visual changes, runny nose, sore throat, mouth sores or tenderness. Lungs: No shortness of breath or cough.  No hemoptysis. Cardiac:  No chest pain, palpitations, orthopnea, or PND. GI:  No nausea, vomiting, diarrhea, constipation, melena or hematochezia. GU:  No urgency, frequency, dysuria, or hematuria. Musculoskeletal:  No back pain.  No joint pain.  No muscle tenderness. Extremities:  No pain or swelling. Skin:  No rashes or skin changes. Neuro:  Cold neuropathy.  Memory issues.  No headache, numbness or weakness, balance or coordination issues. Endocrine:  Diabetes.  Blood sugar 200s.  No thyroid issues, hot  flashes or night sweats. Psych:  No mood changes, depression or anxiety.  Difficulty sleeping. Pain:  No focal pain. Review of systems:  All other systems reviewed and found to be negative.   Physical Exam: Blood pressure 137/65, pulse 87, temperature (!) 94.9 F (34.9 C), temperature source Tympanic, resp. rate 20, weight 275 lb (124.7 kg). GENERAL:  Well developed, well nourished, heavyset gentleman sitting comfortably in the exam room in no acute distress. MENTAL STATUS:  Alert and oriented to person, place and time. HEAD:  Thin gray hair.  Male pattern baldness.  Normocephalic, atraumatic, face symmetric, no Cushingoid features. EYES:  Glasses.  Blue eyes.  Pupils equal round and reactive to light and accomodation.  No conjunctivitis or scleral icterus. ENT:  Oropharynx clear without lesion.  Tongue normal. Mucous membranes moist.  RESPIRATORY:  Clear to auscultation without rales, wheezes or rhonchi. CARDIOVASCULAR:  Regular rate and rhythm without murmur, rub or gallop. ABDOMEN:  Fully round.  Soft, non-tender, with active bowel sounds, and no hepatosplenomegaly.  No masses. SKIN:  No rashes, ulcers or lesions. EXTREMITIES: No edema, no skin discoloration or tenderness.   No palpable cords. LYMPH NODES: No palpable cervical, supraclavicular, axillary or inguinal adenopathy  NEUROLOGICAL: Unremarkable. PSYCH:  Appropriate.    Infusion on 06/28/2017  Component Date Value Ref Range Status  . Magnesium 06/28/2017 1.5* 1.7 - 2.4 mg/dL Final   Performed at [facility], [address], [place], [place] [address]  . Sodium 06/28/2017 135  135 - 145 mmol/L Final  . Potassium 06/28/2017 4.4  3.5 - 5.1 mmol/L Final  . Chloride 06/28/2017 105  101 - 111 mmol/L Final  . CO2 06/28/2017 22  22 - 32 mmol/L Final  . Glucose, Bld 06/28/2017 280* 65 - 99 mg/dL Final  . BUN 06/28/2017 20  6 - 20 mg/dL Final  . Creatinine, Ser 06/28/2017 0.96  0.61 - 1.24 mg/dL Final  . Calcium 06/28/2017 9.3  8.9 - 10.3 mg/dL Final  . Total Protein 06/28/2017 6.7  6.5 - 8.1 g/dL Final  . Albumin 06/28/2017 3.7  3.5 - 5.0 g/dL Final  . AST 06/28/2017 35  15 - 41 U/L Final  . ALT 06/28/2017 31  17 - 63 U/L Final  . Alkaline Phosphatase 06/28/2017 67  38 - 126 U/L Final  . Total Bilirubin 06/28/2017 0.5  0.3 - 1.2 mg/dL Final  . GFR calc non Af Amer 06/28/2017 >60  >60 mL/min Final  . GFR calc Af Amer 06/28/2017 >60  >60 mL/min Final   Comment: (NOTE) The eGFR has been calculated using the CKD EPI equation. This calculation has not been validated in all clinical situations. eGFR's persistently <60 mL/min signify possible Chronic Kidney Disease.   Georgiann Hahn gap 06/28/2017 8  5 - 15 Final   Performed at [facility], [address] [place], [place] [address]  . WBC 06/28/2017 3.8  3.8 - 10.6 K/uL Final  . RBC 06/28/2017 3.53* 4.40 - 5.90 MIL/uL Final  . Hemoglobin 06/28/2017 12.2* 13.0 - 18.0 g/dL Final  . HCT 06/28/2017 35.6* 40.0 - 52.0 % Final  . MCV 06/28/2017 100.8* 80.0 - 100.0 fL Final  . MCH 06/28/2017 34.6* 26.0 - 34.0 pg Final  . MCHC 06/28/2017 34.3  32.0 - 36.0 g/dL Final  . RDW 06/28/2017 18.9* 11.5 - 14.5 % Final  . Platelets 06/28/2017 128* 150 - 440 K/uL  Final  . Neutrophils Relative % 06/28/2017 46  % Final  . Neutro Abs  06/28/2017 1.8  1.4 - 6.5 K/uL Final  . Lymphocytes Relative 06/28/2017 39  % Final  . Lymphs Abs 06/28/2017 1.5  1.0 - 3.6 K/uL Final  . Monocytes Relative 06/28/2017 12  % Final  . Monocytes Absolute 06/28/2017 0.4  0.2 - 1.0 K/uL Final  . Eosinophils Relative 06/28/2017 2  % Final  . Eosinophils Absolute 06/28/2017 0.1  0 - 0.7 K/uL Final  . Basophils Relative 06/28/2017 1  % Final  . Basophils Absolute 06/28/2017 0.0  0 - 0.1 K/uL Final   Performed at [facility], [address] [place], [place] [address]    Assessment:  Donald Heemstra. is a 72 y.o. male with stage IIIB transverse colon cancer s/p right partial colectomy on 12/28/2016.  Pathology revealed a 5.5 cm grade II invasive adenocarcinoma of the transverse colon, extending through the muscularis propria and into the adjacent adipose tissue, including the greater omentum. There was metastatic adenocarcinoma in multiple lymph nodes and tumor deposits, at least 5 involved with a total of at least 18 lymph nodes (5 of 18).  Pathologic stage was T3N2a.  CEA was 1.4 on 11/09/2016.  Abdomen and pelvic CT on 11/09/2016 revealed mid transverse colon segment of luminal narrowing with wall thickening compatible with colon cancer.  There was a single enlarged adjacent mesenteric lymph node and prominent subcentimeter lymph nodes anterior to the pancreas which may be metastatic.  There was no evidence of distant metastasis.  There was a left kidney lower pole complex cyst with thin internal septations (Bosniak IIF).  His prostate was enlarged and extended into the floor of bladder.  There was right kidney lower pole punctate nonobstructing nephrolithiasis.  Chest CT on 11/18/2016 revealed no metastatic disease.  There was abrupt termination of the left upper lobe beyond which the distal bronchus appeared dilated with mild branching into the lung parenchyma.  Imaging  suggested a bronchocele in the medial aspect of the left upper lobe. Differential included an endobronchial polyp, endobronchial tumor, bronchial stricture or aspirated foreign body.   Electromagnetic navigational bronchoscopy (ENB) on 12/16/2016 revealed non-small cell carcinoma, favor squamous cell lung carcinoma.  Tumor was positive for p40 and negative for cdx-2 and TTF-1.  He received SBRT to the left upper lobe of 5000 cGy in 5 fractions from 02/16/2017 - 03/02/2017.  PET scan on 01/26/2017 revealed a 2.4 x 0.9 cm hypermetabolic medial left upper lobe pulmonary nodule, corresponding to known primary bronchogenic neoplasm. No findings suspicious for metastatic disease.  EGD on 11/05/2016 revealed gastritis.  Pathology confirmed chronic mild gastritis and features of a healing erosion in the stomach.  There was no H pylori, dysplasia or malignancy.  Echocardiogram  On 12/15/2016 revealed mild to moderate aortic valve stenosis. There was abnormal LV relaxation consistent with grade I diastolic dysfunction. Estimated EF was 55-60%.  He is s/p 7 cycles of FOLFOX chemotherapy (03/15/2017 - 06/07/2017).  Cycle #8 was held on 06/21/2017 due to low platelet count of 95,000. He has otherwise tolerated chemotherapy well.  He has iron deficiency anemia.  He is on oral iron.  Ferritin was 6 on 09/04/2016.  Hematocrit was 29.7 and hemoglobin 8.8 on 09/04/2016.  Hematocrit was 28.1, hemoglobin 9.3, and MCV 87.9 on 11/09/2016.   Ferritin has been followed: 28 on 10/13/2016, 15 on 01/14/2017, 21 on 03/29/2016, 20 on 04/12/2017, and 24 on 0/01/2018.  Symptomatically, he feels fatigued.  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  Patient has  issues with his memory, which he attributes to chemotherapy. He feels that he is walking differently. He denies nausea, vomiting, and diarrhea.  He has a cold neuropathy.  Exam is stable.  WBC is 3800 with an ANC of 1800. Platelets 128,000.  Magnesium is 1.5 (low).  Plan: 1.  Labs today: CBC with diff, CMP, Mg. 2.  Labs reviewed. Platelet count has recovered. Blood counts stable and adequate enough for treatment. Will proceed with cycle #8 FOLFOX today as planned.     3.  Discuss plan for interval chest imaging on 07/09/2017 and follow up with radiation oncology on 07/14/2017. 4.  Discuss low magnesium. Magnesium 1.5 today. Will replace with 2 grams intravenous magnesium today. Patient to continue oral magnesium at home.  5.  Discuss HYPERglycemia. Note sent to Dr. Sanda Klein. 6.  RTC on 07/12/2017 for MD assessment,  labs (CBC with diff, CMP, Mg, ferritin), and cycle #9 FOLFOX chemotherapy.   [name], NP  06/28/2017, 10:12 AM  I saw and evaluated the patient, participating in the key portions of the service and reviewing pertinent diagnostic studies and records.  I reviewed the nurse practitioner's note and agree with the findings and the plan.  The assessment and plan were discussed with the patient.  Several questions were asked by the patient and answered.   Nolon Stalls, MD 06/28/2017,10:12 AM

## 2017-06-28 ENCOUNTER — Other Ambulatory Visit: Payer: Self-pay

## 2017-06-28 ENCOUNTER — Inpatient Hospital Stay: Payer: Medicare Other

## 2017-06-28 ENCOUNTER — Inpatient Hospital Stay (HOSPITAL_BASED_OUTPATIENT_CLINIC_OR_DEPARTMENT_OTHER): Payer: Medicare Other | Admitting: Hematology and Oncology

## 2017-06-28 ENCOUNTER — Encounter: Payer: Self-pay | Admitting: Hematology and Oncology

## 2017-06-28 VITALS — BP 137/65 | HR 87 | Temp 94.9°F | Resp 20 | Wt 275.0 lb

## 2017-06-28 DIAGNOSIS — G608 Other hereditary and idiopathic neuropathies: Secondary | ICD-10-CM

## 2017-06-28 DIAGNOSIS — G629 Polyneuropathy, unspecified: Secondary | ICD-10-CM

## 2017-06-28 DIAGNOSIS — C3412 Malignant neoplasm of upper lobe, left bronchus or lung: Secondary | ICD-10-CM | POA: Diagnosis not present

## 2017-06-28 DIAGNOSIS — C184 Malignant neoplasm of transverse colon: Secondary | ICD-10-CM

## 2017-06-28 DIAGNOSIS — D509 Iron deficiency anemia, unspecified: Secondary | ICD-10-CM | POA: Diagnosis not present

## 2017-06-28 DIAGNOSIS — Z5111 Encounter for antineoplastic chemotherapy: Secondary | ICD-10-CM

## 2017-06-28 DIAGNOSIS — Z7189 Other specified counseling: Secondary | ICD-10-CM

## 2017-06-28 LAB — COMPREHENSIVE METABOLIC PANEL
ALT: 31 U/L (ref 17–63)
AST: 35 U/L (ref 15–41)
Albumin: 3.7 g/dL (ref 3.5–5.0)
Alkaline Phosphatase: 67 U/L (ref 38–126)
Anion gap: 8 (ref 5–15)
BUN: 20 mg/dL (ref 6–20)
CO2: 22 mmol/L (ref 22–32)
Calcium: 9.3 mg/dL (ref 8.9–10.3)
Chloride: 105 mmol/L (ref 101–111)
Creatinine, Ser: 0.96 mg/dL (ref 0.61–1.24)
GFR calc Af Amer: >60 mL/min (ref >60)
GFR calc non Af Amer: >60 mL/min (ref >60)
Glucose, Bld: 280 mg/dL — ABNORMAL HIGH (ref 65–99)
Potassium: 4.4 mmol/L (ref 3.5–5.1)
Sodium: 135 mmol/L (ref 135–145)
Total Bilirubin: 0.5 mg/dL (ref 0.3–1.2)
Total Protein: 6.7 g/dL (ref 6.5–8.1)

## 2017-06-28 LAB — CBC WITH DIFFERENTIAL/PLATELET
Basophils Absolute: 0 10*3/uL (ref 0–0.1)
Basophils Relative: 1 %
Eosinophils Absolute: 0.1 10*3/uL (ref 0–0.7)
Eosinophils Relative: 2 %
HCT: 35.6 % — ABNORMAL LOW (ref 40.0–52.0)
Hemoglobin: 12.2 g/dL — ABNORMAL LOW (ref 13.0–18.0)
Lymphocytes Relative: 39 %
Lymphs Abs: 1.5 10*3/uL (ref 1.0–3.6)
MCH: 34.6 pg — ABNORMAL HIGH (ref 26.0–34.0)
MCHC: 34.3 g/dL (ref 32.0–36.0)
MCV: 100.8 fL — ABNORMAL HIGH (ref 80.0–100.0)
Monocytes Absolute: 0.4 10*3/uL (ref 0.2–1.0)
Monocytes Relative: 12 %
Neutro Abs: 1.8 10*3/uL (ref 1.4–6.5)
Neutrophils Relative %: 46 %
Platelets: 128 10*3/uL — ABNORMAL LOW (ref 150–440)
RBC: 3.53 MIL/uL — ABNORMAL LOW (ref 4.40–5.90)
RDW: 18.9 % — ABNORMAL HIGH (ref 11.5–14.5)
WBC: 3.8 10*3/uL (ref 3.8–10.6)

## 2017-06-28 LAB — MAGNESIUM: Magnesium: 1.5 mg/dL — ABNORMAL LOW (ref 1.7–2.4)

## 2017-06-28 MED ORDER — SODIUM CHLORIDE 0.9% FLUSH
10.0000 mL | INTRAVENOUS | Status: DC | PRN
  Administered 2017-06-28: 10 mL via INTRAVENOUS
  Filled 2017-06-28: qty 10

## 2017-06-28 MED ORDER — MAGNESIUM SULFATE 2 GM/50ML IV SOLN
2.0000 g | Freq: Once | INTRAVENOUS | Status: AC
  Administered 2017-06-28: 2 g via INTRAVENOUS
  Filled 2017-06-28: qty 50

## 2017-06-28 MED ORDER — HEPARIN SOD (PORK) LOCK FLUSH 100 UNIT/ML IV SOLN: 500.0000 [IU] | Freq: Once | INTRAVENOUS | Status: DC

## 2017-06-28 MED ORDER — DEXAMETHASONE SODIUM PHOSPHATE 10 MG/ML IJ SOLN
10.0000 mg | Freq: Once | INTRAMUSCULAR | Status: AC
  Administered 2017-06-28: 10 mg via INTRAVENOUS
  Filled 2017-06-28: qty 1

## 2017-06-28 MED ORDER — OXALIPLATIN CHEMO INJECTION 100 MG/20ML
200.0000 mg | Freq: Once | INTRAVENOUS | Status: AC
  Administered 2017-06-28: 200 mg via INTRAVENOUS
  Filled 2017-06-28: qty 40

## 2017-06-28 MED ORDER — SODIUM CHLORIDE 0.9 % IV SOLN
Freq: Once | INTRAVENOUS | Status: DC
  Filled 2017-06-28: qty 1000

## 2017-06-28 MED ORDER — DEXTROSE 5 % IV SOLN
Freq: Once | INTRAVENOUS | Status: AC
  Administered 2017-06-28: 11:00:00 via INTRAVENOUS
  Filled 2017-06-28: qty 1000

## 2017-06-28 MED ORDER — SODIUM CHLORIDE 0.9 % IV SOLN
2400.0000 mg/m2 | INTRAVENOUS | Status: DC
  Administered 2017-06-28: 5950 mg via INTRAVENOUS
  Filled 2017-06-28: qty 100

## 2017-06-28 MED ORDER — DEXTROSE 5 % IV SOLN
1000.0000 mg | Freq: Once | INTRAVENOUS | Status: AC
  Administered 2017-06-28: 1000 mg via INTRAVENOUS
  Filled 2017-06-28: qty 50

## 2017-06-28 MED ORDER — FLUOROURACIL CHEMO INJECTION 2.5 GM/50ML
400.0000 mg/m2 | Freq: Once | INTRAVENOUS | Status: AC
  Administered 2017-06-28: 1000 mg via INTRAVENOUS
  Filled 2017-06-28: qty 20

## 2017-06-28 MED ORDER — PALONOSETRON HCL INJECTION 0.25 MG/5ML
0.2500 mg | Freq: Once | INTRAVENOUS | Status: AC
  Administered 2017-06-28: 0.25 mg via INTRAVENOUS
  Filled 2017-06-28: qty 5

## 2017-06-28 NOTE — Progress Notes (Signed)
Here for follow up. Over doing " ok I reckon "

## 2017-06-30 ENCOUNTER — Inpatient Hospital Stay: Payer: Medicare Other

## 2017-06-30 VITALS — BP 129/70 | HR 102 | Temp 97.9°F | Resp 20

## 2017-06-30 DIAGNOSIS — C184 Malignant neoplasm of transverse colon: Secondary | ICD-10-CM

## 2017-06-30 DIAGNOSIS — Z5111 Encounter for antineoplastic chemotherapy: Secondary | ICD-10-CM | POA: Diagnosis not present

## 2017-06-30 MED ORDER — HEPARIN SOD (PORK) LOCK FLUSH 100 UNIT/ML IV SOLN
500.0000 [IU] | Freq: Once | INTRAVENOUS | Status: AC | PRN
  Administered 2017-06-30: 500 [IU]
  Filled 2017-06-30: qty 5

## 2017-06-30 MED ORDER — SODIUM CHLORIDE 0.9% FLUSH
10.0000 mL | INTRAVENOUS | Status: DC | PRN
  Administered 2017-06-30: 10 mL
  Filled 2017-06-30: qty 10

## 2017-07-03 DIAGNOSIS — E1121 Type 2 diabetes mellitus with diabetic nephropathy: Secondary | ICD-10-CM | POA: Diagnosis not present

## 2017-07-09 ENCOUNTER — Ambulatory Visit
Admission: RE | Admit: 2017-07-09 | Discharge: 2017-07-09 | Disposition: A | Discharge status: Home / Self Care | Payer: Medicare Other | Source: Ambulatory Visit | Attending: Radiation Oncology | Admitting: Radiation Oncology

## 2017-07-09 DIAGNOSIS — I700 Atherosclerosis of aorta: Secondary | ICD-10-CM | POA: Insufficient documentation

## 2017-07-09 DIAGNOSIS — C3412 Malignant neoplasm of upper lobe, left bronchus or lung: Secondary | ICD-10-CM

## 2017-07-09 DIAGNOSIS — R911 Solitary pulmonary nodule: Secondary | ICD-10-CM | POA: Diagnosis not present

## 2017-07-09 MED ORDER — IOPAMIDOL (ISOVUE-370) INJECTION 76%
75.0000 mL | Freq: Once | INTRAVENOUS | Status: AC | PRN
  Administered 2017-07-09: 75 mL via INTRAVENOUS

## 2017-07-10 NOTE — Progress Notes (Signed)
[facility] day:  07/12/2017  Chief Complaint: Donald Bertz. is a 72 y.o. male with stage IIIB colon cancer who is seen for assessment prior to cycle #9 FOLFOX chemotherapy.  HPI:   The patient was last seen in the medical oncology clinic on 06/28/2017.  At that time, patient described that me was "walking different" following his last cycle of chemotherapy. No balance or coordination issues. He denied nausea, vomiting, and diarrhea. He also complained of memory issues.  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  Exam was stable. WBC was 3800 with an ANC of 1800. Platelets 128,000. He received cycle #8 FOLFOX. Mg is 1.5 (low); replaced with 2 g IV Mg.   Interval chest CT on 07/09/2017 that revealed no residual LUL pulmonary nodule following SBRT treatment. There were no new pulmonary nodules observed. Small distal paraesophageal nodes stable in appearance.  Study negative for thoracic metastatic disease.   Patient is scheduled to follow up with Dr. Baruch Gouty (radiation oncology) on 07/14/2017.   In the interim, patient has been doing well overall. Patient has no nausea, vomiting, and changes in her bowel habits. Patient has a chronic productive cough. Patient states, "I cough up this thick clear stuff". Patient denies palmar-plantar erythrodysesthesia, neuropathy, and cold sensitivity associated with the current treatment regimen. He has no B symptoms. No recent infections.   Patient is eating well. His weight is down 2 pounds. Patient denies pain in the clinic today.    Past Medical History:  Diagnosis Date  . Abdominal aortic atherosclerosis ([place]) 09/15/2016   CT scan June 2018  . Allergy   . Cancer ([place])   . Coronary artery calcification seen on CAT scan 09/15/2016   Previous smoker; noted on CT scan June 2018  . Diabetes mellitus without complication ([place])   . GERD (gastroesophageal reflux disease)   . Gout   . Hyperlipidemia   .  Hypertension   . Kidney function abnormal    stage 2  . Kidney stones   . Obesity 08/20/2015    Past Surgical History:  Procedure Laterality Date  . COLONOSCOPY WITH PROPOFOL N/A 11/05/2016   Procedure: COLONOSCOPY WITH PROPOFOL;  Surgeon: Lucilla Lame, MD;  Location: [facility];  Service: Gastroenterology;  Laterality: N/A;  . ELECTROMAGNETIC NAVIGATION BROCHOSCOPY N/A 12/16/2016   Procedure: ELECTROMAGNETIC NAVIGATION BRONCHOSCOPY;  Surgeon: Flora Lipps, MD;  Location: [facility];  Service: Cardiopulmonary;  Laterality: N/A;  . ESOPHAGOGASTRODUODENOSCOPY (EGD) WITH PROPOFOL N/A 11/05/2016   Procedure: ESOPHAGOGASTRODUODENOSCOPY (EGD) WITH PROPOFOL;  Surgeon: Lucilla Lame, MD;  Location: [facility];  Service: Gastroenterology;  Laterality: N/A;  Diabetic - oral meds  . kidney stones    . PARTIAL COLECTOMY N/A 12/28/2016   Procedure: PARTIAL COLECTOMY RIGHT;  Surgeon: Jules Husbands, MD;  Location: [facility];  Service: General;  Laterality: N/A;  . POLYPECTOMY N/A 11/05/2016   Procedure: POLYPECTOMY;  Surgeon: Lucilla Lame, MD;  Location: [facility];  Service: Gastroenterology;  Laterality: N/A;  . PORTACATH PLACEMENT Right 01/18/2017   Procedure: INSERTION PORT-A-CATH;  Surgeon: Jules Husbands, MD;  Location: [facility];  Service: General;  Laterality: Right;    Family History  Problem Relation Age of Onset  . Alzheimer's disease Mother   . Heart disease Father   . Diabetes Father   . Cancer Father        Prostate  . Cancer Sister        skin cancer  Social History:  reports that he quit smoking about 12 months ago. His smoking use included cigarettes. He has never used smokeless tobacco. He reports that he does not drink alcohol or use drugs.  Patient worked for a [employer].  He retired in 1996.  He was in his 14s. Patient was a 1-2 pack per day smoker since he was a child.  He stopped smoking in 07/2016.  He drank alcohol in the past.  He has 3 living  sisters (1 sister died).  He has no children. Patient married to Soham.  Communicate with neighbor [name]  [phone]. The patient is alone today.   Allergies: No Known Allergies  Current Medications: Current Outpatient Medications  Medication Sig Dispense Refill  . allopurinol (ZYLOPRIM) 300 MG tablet Take 1 tablet (300 mg total) by mouth daily. 90 tablet 3  . Cyanocobalamin (VITAMIN B-12 PO) Take 1 tablet by mouth daily.    Marland Kitchen GLIPIZIDE XL 10 MG 24 hr tablet TAKE 1 TABLET BY MOUTH TWICE DAILY 180 tablet 1  . glucose blood (ACCU-CHEK COMPACT PLUS) test strip Check blood sugars once a day if desired; LON 99 months; Dx E11.21 102 each 1  . glucose blood (ACCU-CHEK COMPACT STRIPS) test strip 100 each by Other route as needed for other. Use as instructed, check FSBS once a day; LON 99 months, E11.9 100 each 1  . magnesium oxide (MAG-OX) 400 MG tablet Take 1 tablet (400 mg total) by mouth daily. 90 tablet 1  . metFORMIN (GLUCOPHAGE) 1000 MG tablet Take 1 tablet (1,000 mg total) by mouth 2 (two) times daily with a meal. 180 tablet 1  . mupirocin ointment (BACTROBAN) 2 %   0  . pantoprazole (PROTONIX) 20 MG tablet TAKE 1 TABLET BY MOUTH ONCE DAILY 90 tablet 0  . quinapril (ACCUPRIL) 5 MG tablet TAKE 1 TABLET BY MOUTH ONCE DAILY 30 tablet 5  . rosuvastatin (CRESTOR) 10 MG tablet Take 1 tablet (10 mg total) by mouth at bedtime. 90 tablet 1  . saw palmetto 160 MG capsule Take 160 mg 2 (two) times daily by mouth.    . tamsulosin (FLOMAX) 0.4 MG CAPS capsule Take 0.4 mg by mouth daily.    Marland Kitchen lidocaine-prilocaine (EMLA) cream Apply to affected area once (Patient not taking: Reported on 07/12/2017) 30 g 3   No current facility-administered medications for this visit.    Facility-Administered Medications Ordered in Other Visits  Medication Dose Route Frequency Provider Last Rate Last Dose  . heparin lock flush 100 unit/mL  500 Units Intravenous Once Lequita Asal, MD        Review of  Systems:  GENERAL:  Feels good.  No fevers, sweats.  Weight down 2 pounds. PERFORMANCE STATUS (ECOG):  1 HEENT:  No visual changes, runny nose, sore throat, mouth sores or tenderness. Lungs: No shortness of breath.  Clear productive cough at times.  No hemoptysis. Cardiac:  No chest pain, palpitations, orthopnea, or PND. GI:  No nausea, vomiting, diarrhea, constipation, melena or hematochezia. GU:  No urgency, frequency, dysuria, or hematuria. Musculoskeletal:  No back pain.  No joint pain.  No muscle tenderness. Extremities:  No pain or swelling. Skin:  No rashes or skin changes. Neuro:  Cold neuropathy post chemotherapy.  Memory issues.  No headache, numbness or weakness, balance or coordination issues. Endocrine:  Diabetes.  No thyroid issues, hot flashes or night sweats. Psych:  No mood changes, depression or anxiety.  Sleep issues. Pain:  No focal  pain. Review of systems:  All other systems reviewed and found to be negative.   Physical Exam: Blood pressure 130/73, pulse 86, temperature 97.8 F (36.6 C), temperature source Tympanic, resp. rate 18, height _0  (1.803 m), weight 273 lb 9.6 oz (124.1 kg), SpO2 96 %. GENERAL:  Well developed, well nourished, heavyset gentleman sitting comfortably in the exam room in no acute distress. MENTAL STATUS:  Alert and oriented to person, place and time. HEAD:  Thin gray hair.  Male pattern baldness.  Normocephalic, atraumatic, face symmetric, no Cushingoid features. EYES:  Glasses.  Blue eyes.  Pupils equal round and reactive to light and accomodation.  No conjunctivitis or scleral icterus. ENT:  Oropharynx clear without lesion.  Tongue normal. Mucous membranes moist.  RESPIRATORY:  Clear to auscultation without rales, wheezes or rhonchi. CARDIOVASCULAR:  Regular rate and rhythm without murmur, rub or gallop. ABDOMEN: Fully round.  Soft, non-tender, with active bowel sounds, and no hepatosplenomegaly.  No masses. SKIN:  No rashes, ulcers or  lesions. EXTREMITIES: No edema, no skin discoloration or tenderness.  No palpable cords. LYMPH NODES: No palpable cervical, supraclavicular, axillary or inguinal adenopathy  NEUROLOGICAL: Unremarkable. PSYCH:  Appropriate.    Infusion on 07/12/2017  Component Date Value Ref Range Status  . Magnesium 07/12/2017 1.7  1.7 - 2.4 mg/dL Final   Performed at [facility], [address], [place], [place] [address]  . Sodium 07/12/2017 136  135 - 145 mmol/L Final  . Potassium 07/12/2017 4.4  3.5 - 5.1 mmol/L Final  . Chloride 07/12/2017 105  101 - 111 mmol/L Final  . CO2 07/12/2017 22  22 - 32 mmol/L Final  . Glucose, Bld 07/12/2017 322* 65 - 99 mg/dL Final  . BUN 07/12/2017 18  6 - 20 mg/dL Final  . Creatinine, Ser 07/12/2017 0.94  0.61 - 1.24 mg/dL Final  . Calcium 07/12/2017 9.1  8.9 - 10.3 mg/dL Final  . Total Protein 07/12/2017 6.7  6.5 - 8.1 g/dL Final  . Albumin 07/12/2017 3.6  3.5 - 5.0 g/dL Final  . AST 07/12/2017 41  15 - 41 U/L Final  . ALT 07/12/2017 36  17 - 63 U/L Final  . Alkaline Phosphatase 07/12/2017 69  38 - 126 U/L Final  . Total Bilirubin 07/12/2017 0.4  0.3 - 1.2 mg/dL Final  . GFR calc non Af Amer 07/12/2017 >60  >60 mL/min Final  . GFR calc Af Amer 07/12/2017 >60  >60 mL/min Final   Comment: (NOTE) The eGFR has been calculated using the CKD EPI equation. This calculation has not been validated in all clinical situations. eGFR's persistently <60 mL/min signify possible Chronic Kidney Disease.   Georgiann Hahn gap 07/12/2017 9  5 - 15 Final   Performed at [facility], [address] [place], [place] [address]  . WBC 07/12/2017 3.7* 3.8 - 10.6 K/uL Final  . RBC 07/12/2017 3.47* 4.40 - 5.90 MIL/uL Final  . Hemoglobin 07/12/2017 12.1* 13.0 - 18.0 g/dL Final  . HCT 07/12/2017 35.1* 40.0 - 52.0 % Final  . MCV 07/12/2017 100.9* 80.0 - 100.0 fL Final  . MCH 07/12/2017 34.8* 26.0 - 34.0 pg Final  . MCHC 07/12/2017 34.5  32.0 - 36.0 g/dL Final  . RDW 07/12/2017  17.7* 11.5 - 14.5 % Final  . Platelets 07/12/2017 103* 150 - 440 K/uL Final  . Neutrophils Relative % 07/12/2017 55  % Final  . Neutro Abs 07/12/2017 2.1  1.4 - 6.5 K/uL Final  . Lymphocytes  Relative 07/12/2017 33  % Final  . Lymphs Abs 07/12/2017 1.2  1.0 - 3.6 K/uL Final  . Monocytes Relative 07/12/2017 9  % Final  . Monocytes Absolute 07/12/2017 0.3  0.2 - 1.0 K/uL Final  . Eosinophils Relative 07/12/2017 2  % Final  . Eosinophils Absolute 07/12/2017 0.1  0 - 0.7 K/uL Final  . Basophils Relative 07/12/2017 1  % Final  . Basophils Absolute 07/12/2017 0.0  0 - 0.1 K/uL Final   Performed at [facility], [address] [place], [place] [address]    Assessment:  Donald Landi. is a 72 y.o. male with stage IIIB transverse colon cancer s/p right partial colectomy on 12/28/2016.  Pathology revealed a 5.5 cm grade II invasive adenocarcinoma of the transverse colon, extending through the muscularis propria and into the adjacent adipose tissue, including the greater omentum. There was metastatic adenocarcinoma in multiple lymph nodes and tumor deposits, at least 5 involved with a total of at least 18 lymph nodes (5 of 18).  Pathologic stage was T3N2a.  CEA was 1.4 on 11/09/2016.  Abdomen and pelvic CT on 11/09/2016 revealed mid transverse colon segment of luminal narrowing with wall thickening compatible with colon cancer.  There was a single enlarged adjacent mesenteric lymph node and prominent subcentimeter lymph nodes anterior to the pancreas which may be metastatic.  There was no evidence of distant metastasis.  There was a left kidney lower pole complex cyst with thin internal septations (Bosniak IIF).  His prostate was enlarged and extended into the floor of bladder.  There was right kidney lower pole punctate nonobstructing nephrolithiasis.  Chest CT on 11/18/2016 revealed no metastatic disease.  There was abrupt termination of the left upper lobe beyond which the distal bronchus  appeared dilated with mild branching into the lung parenchyma.  Imaging suggested a bronchocele in the medial aspect of the left upper lobe. Differential included an endobronchial polyp, endobronchial tumor, bronchial stricture or aspirated foreign body.   Electromagnetic navigational bronchoscopy (ENB) on 12/16/2016 revealed non-small cell carcinoma, favor squamous cell lung carcinoma.  Tumor was positive for p40 and negative for cdx-2 and TTF-1.  He received SBRT to the left upper lobe of 5000 cGy in 5 fractions from 02/16/2017 - 03/02/2017.  Chest CT on 07/09/2017  revealed no residual LUL pulmonary nodule following SBRT treatment. There were no new pulmonary nodules observed.Small distal paraesophageal nodes stable in appearance. Study negative for thoracic metastatic disease.   PET scan on 01/26/2017 revealed a 2.4 x 0.9 cm hypermetabolic medial left upper lobe pulmonary nodule, corresponding to known primary bronchogenic neoplasm. No findings suspicious for metastatic disease.  EGD on 11/05/2016 revealed gastritis.  Pathology confirmed chronic mild gastritis and features of a healing erosion in the stomach.  There was no H pylori, dysplasia or malignancy.  Echocardiogram  On 12/15/2016 revealed mild to moderate aortic valve stenosis. There was abnormal LV relaxation consistent with grade I diastolic dysfunction. Estimated EF was 55-60%.  He is s/p 8 cycles of FOLFOX chemotherapy (03/15/2017 - 06/28/2017).  Cycle #8 was held on 06/21/2017 due to low platelet count of 95,000. He has otherwise tolerated chemotherapy well.  He has iron deficiency anemia.  He is on oral iron.  Ferritin was 6 on 09/04/2016.  Hematocrit was 29.7 and hemoglobin 8.8 on 09/04/2016.  Hematocrit was 28.1, hemoglobin 9.3, and MCV 87.9 on 11/09/2016.   Ferritin has been followed: 28 on 10/13/2016, 15 on 01/14/2017, 21 on 03/29/2016, 20 on 04/12/2017, and 24  on 0/01/2018.  Symptomatically, he is doing well.  His neuropathy  has resolved. He denies nausea, vomiting, and diarrhea.  Exam is stable.  WBC is 3700 with an [place] of 2100. Platelets 103,000.  Blood sugar is 322.  Plan: 1.  Labs today: CBC with diff, CMP, Mg, ferritin 2.  Review chest CT. No residual LUL pulmonary nodule following SBRT treatments. No new nodules. Distal paraesophageal nodes stable. Negative for thoracic metastatic disease.    3.  Labs reviewed. Blood counts stable and adequate enough for treatment. Will proceed with cycle #9 FOLFOX. 4.  Follow up with radiation oncology as planned on 07/14/2017. 5.  Discuss routine post-treatment surveillance schedule post completion of chemotherapy. Patient will be seen in the medical oncology clinic every 3 months for the first year, every 4 months for year 2, every 6 months for years 3-5, and then annually thereafter.  6.  Discuss HYPERglycemia. Note sent to Dr. Sanda Klein. 7.  RTC in 2 days for disconnect. 8.  RTC on 07/26/2017 for MD assessment,  labs (CBC with diff, CMP, Mg, ferritin), and cycle #10 FOLFOX chemotherapy.   [name], NP  07/12/2017, 10:07 AM  I saw and evaluated the patient, participating in the key portions of the service and reviewing pertinent diagnostic studies and records.  I reviewed the nurse practitioner's note and agree with the findings and the plan.  The assessment and plan were discussed with the patient.  A few questions were asked by the patient and answered.   Nolon Stalls, MD 07/12/2017,10:07 AM

## 2017-07-12 ENCOUNTER — Inpatient Hospital Stay: Payer: Medicare Other

## 2017-07-12 ENCOUNTER — Inpatient Hospital Stay (HOSPITAL_BASED_OUTPATIENT_CLINIC_OR_DEPARTMENT_OTHER): Payer: Medicare Other | Admitting: Hematology and Oncology

## 2017-07-12 ENCOUNTER — Other Ambulatory Visit: Payer: Self-pay | Admitting: Hematology and Oncology

## 2017-07-12 ENCOUNTER — Encounter: Payer: Self-pay | Admitting: Hematology and Oncology

## 2017-07-12 VITALS — BP 130/73 | HR 86 | Temp 97.8°F | Resp 18 | Ht 71.0 in | Wt 273.6 lb

## 2017-07-12 DIAGNOSIS — C184 Malignant neoplasm of transverse colon: Secondary | ICD-10-CM

## 2017-07-12 DIAGNOSIS — C3412 Malignant neoplasm of upper lobe, left bronchus or lung: Secondary | ICD-10-CM

## 2017-07-12 DIAGNOSIS — Z5111 Encounter for antineoplastic chemotherapy: Secondary | ICD-10-CM

## 2017-07-12 DIAGNOSIS — R739 Hyperglycemia, unspecified: Secondary | ICD-10-CM

## 2017-07-12 LAB — CBC WITH DIFFERENTIAL/PLATELET
Basophils Absolute: 0 10*3/uL (ref 0–0.1)
Basophils Relative: 1 %
Eosinophils Absolute: 0.1 10*3/uL (ref 0–0.7)
Eosinophils Relative: 2 %
HCT: 35.1 % — ABNORMAL LOW (ref 40.0–52.0)
Hemoglobin: 12.1 g/dL — ABNORMAL LOW (ref 13.0–18.0)
Lymphocytes Relative: 33 %
Lymphs Abs: 1.2 10*3/uL (ref 1.0–3.6)
MCH: 34.8 pg — ABNORMAL HIGH (ref 26.0–34.0)
MCHC: 34.5 g/dL (ref 32.0–36.0)
MCV: 100.9 fL — ABNORMAL HIGH (ref 80.0–100.0)
Monocytes Absolute: 0.3 10*3/uL (ref 0.2–1.0)
Monocytes Relative: 9 %
Neutro Abs: 2.1 10*3/uL (ref 1.4–6.5)
Neutrophils Relative %: 55 %
Platelets: 103 10*3/uL — ABNORMAL LOW (ref 150–440)
RBC: 3.47 MIL/uL — ABNORMAL LOW (ref 4.40–5.90)
RDW: 17.7 % — ABNORMAL HIGH (ref 11.5–14.5)
WBC: 3.7 10*3/uL — ABNORMAL LOW (ref 3.8–10.6)

## 2017-07-12 LAB — COMPREHENSIVE METABOLIC PANEL
ALT: 36 U/L (ref 17–63)
AST: 41 U/L (ref 15–41)
Albumin: 3.6 g/dL (ref 3.5–5.0)
Alkaline Phosphatase: 69 U/L (ref 38–126)
Anion gap: 9 (ref 5–15)
BUN: 18 mg/dL (ref 6–20)
CO2: 22 mmol/L (ref 22–32)
Calcium: 9.1 mg/dL (ref 8.9–10.3)
Chloride: 105 mmol/L (ref 101–111)
Creatinine, Ser: 0.94 mg/dL (ref 0.61–1.24)
GFR calc Af Amer: >60 mL/min (ref >60)
GFR calc non Af Amer: >60 mL/min (ref >60)
Glucose, Bld: 322 mg/dL — ABNORMAL HIGH (ref 65–99)
Potassium: 4.4 mmol/L (ref 3.5–5.1)
Sodium: 136 mmol/L (ref 135–145)
Total Bilirubin: 0.4 mg/dL (ref 0.3–1.2)
Total Protein: 6.7 g/dL (ref 6.5–8.1)

## 2017-07-12 LAB — FERRITIN: Ferritin: 49 ng/mL (ref 24–336)

## 2017-07-12 LAB — MAGNESIUM: Magnesium: 1.7 mg/dL (ref 1.7–2.4)

## 2017-07-12 MED ORDER — DEXAMETHASONE SODIUM PHOSPHATE 10 MG/ML IJ SOLN
10.0000 mg | Freq: Once | INTRAMUSCULAR | Status: AC
  Administered 2017-07-12: 10 mg via INTRAVENOUS
  Filled 2017-07-12: qty 1

## 2017-07-12 MED ORDER — OXALIPLATIN CHEMO INJECTION 100 MG/20ML
200.0000 mg | Freq: Once | INTRAVENOUS | Status: AC
  Administered 2017-07-12: 200 mg via INTRAVENOUS
  Filled 2017-07-12: qty 40

## 2017-07-12 MED ORDER — LEUCOVORIN CALCIUM INJECTION 350 MG
1000.0000 mg | Freq: Once | INTRAMUSCULAR | Status: AC
  Administered 2017-07-12: 1000 mg via INTRAVENOUS
  Filled 2017-07-12: qty 50

## 2017-07-12 MED ORDER — DEXTROSE 5 % IV SOLN
Freq: Once | INTRAVENOUS | Status: AC
  Administered 2017-07-12: 11:00:00 via INTRAVENOUS
  Filled 2017-07-12: qty 1000

## 2017-07-12 MED ORDER — FLUOROURACIL CHEMO INJECTION 2.5 GM/50ML
400.0000 mg/m2 | Freq: Once | INTRAVENOUS | Status: AC
  Administered 2017-07-12: 1000 mg via INTRAVENOUS
  Filled 2017-07-12: qty 20

## 2017-07-12 MED ORDER — PALONOSETRON HCL INJECTION 0.25 MG/5ML
0.2500 mg | Freq: Once | INTRAVENOUS | Status: AC
  Administered 2017-07-12: 0.25 mg via INTRAVENOUS
  Filled 2017-07-12: qty 5

## 2017-07-12 MED ORDER — SODIUM CHLORIDE 0.9 % IV SOLN
2400.0000 mg/m2 | INTRAVENOUS | Status: DC
  Administered 2017-07-12: 5950 mg via INTRAVENOUS
  Filled 2017-07-12: qty 100

## 2017-07-12 NOTE — Progress Notes (Signed)
No new changes noted today 

## 2017-07-13 ENCOUNTER — Other Ambulatory Visit: Payer: Self-pay | Admitting: *Deleted

## 2017-07-13 ENCOUNTER — Encounter: Payer: Self-pay | Admitting: Urgent Care

## 2017-07-13 DIAGNOSIS — C182 Malignant neoplasm of ascending colon: Secondary | ICD-10-CM

## 2017-07-14 ENCOUNTER — Ambulatory Visit
Admission: RE | Admit: 2017-07-14 | Discharge: 2017-07-14 | Disposition: A | Discharge status: Home / Self Care | Payer: Medicare Other | Source: Ambulatory Visit | Attending: Radiation Oncology | Admitting: Radiation Oncology

## 2017-07-14 ENCOUNTER — Other Ambulatory Visit: Payer: Self-pay | Admitting: *Deleted

## 2017-07-14 ENCOUNTER — Other Ambulatory Visit: Payer: Self-pay

## 2017-07-14 ENCOUNTER — Encounter: Payer: Self-pay | Admitting: Radiation Oncology

## 2017-07-14 ENCOUNTER — Inpatient Hospital Stay: Payer: Medicare Other | Attending: Hematology and Oncology

## 2017-07-14 VITALS — BP 124/62 | HR 94 | Temp 97.2°F | Resp 18 | Wt 270.8 lb

## 2017-07-14 VITALS — BP 118/60 | HR 89 | Temp 98.0°F | Resp 20

## 2017-07-14 DIAGNOSIS — D509 Iron deficiency anemia, unspecified: Secondary | ICD-10-CM | POA: Diagnosis not present

## 2017-07-14 DIAGNOSIS — Z5111 Encounter for antineoplastic chemotherapy: Secondary | ICD-10-CM | POA: Diagnosis not present

## 2017-07-14 DIAGNOSIS — Z452 Encounter for adjustment and management of vascular access device: Secondary | ICD-10-CM | POA: Diagnosis not present

## 2017-07-14 DIAGNOSIS — E1165 Type 2 diabetes mellitus with hyperglycemia: Secondary | ICD-10-CM | POA: Insufficient documentation

## 2017-07-14 DIAGNOSIS — C184 Malignant neoplasm of transverse colon: Secondary | ICD-10-CM | POA: Diagnosis present

## 2017-07-14 DIAGNOSIS — C3412 Malignant neoplasm of upper lobe, left bronchus or lung: Secondary | ICD-10-CM | POA: Diagnosis not present

## 2017-07-14 DIAGNOSIS — Z87891 Personal history of nicotine dependence: Secondary | ICD-10-CM | POA: Diagnosis not present

## 2017-07-14 DIAGNOSIS — Z923 Personal history of irradiation: Secondary | ICD-10-CM | POA: Insufficient documentation

## 2017-07-14 DIAGNOSIS — R05 Cough: Secondary | ICD-10-CM | POA: Diagnosis not present

## 2017-07-14 MED ORDER — HEPARIN SOD (PORK) LOCK FLUSH 100 UNIT/ML IV SOLN
500.0000 [IU] | Freq: Once | INTRAVENOUS | Status: AC | PRN
  Administered 2017-07-14: 500 [IU]

## 2017-07-14 MED ORDER — SODIUM CHLORIDE 0.9% FLUSH
10.0000 mL | INTRAVENOUS | Status: DC | PRN
  Administered 2017-07-14: 10 mL
  Filled 2017-07-14: qty 10

## 2017-07-14 MED ORDER — HEPARIN SOD (PORK) LOCK FLUSH 100 UNIT/ML IV SOLN
INTRAVENOUS | Status: AC
  Filled 2017-07-14: qty 5

## 2017-07-14 NOTE — Progress Notes (Signed)
Radiation Oncology Follow up Note  Name: Donald Mcdowell.   Date:   07/14/2017 MRN:  [id number] DOB: 1945-11-09    This 72 y.o. male presents to the clinic today for four-month follow-up status post SB RT to his left upper lobe for squamous cell carcinoma stage I.  REFERRING PROVIDER: Arnetha Courser, MD  HPI: patient is a 72 year old male now out 4 months having completed SB RT to his left upper lobe for stage I squamous cell carcinoma. Patient also has no known locally advanced rectal cancer currently on FOLFOX chemotherapy. He is seen today in routine follow-up is doing fairly well. He states his breathing is stable he specifically denies cough hemoptysis or chest tightness..patient recently had a CT scan of his chest showing no residual left upper lobe pulmonary nodule and no new pulmonary nodules or other evidence of metastatic disease in his thorax.  COMPLICATIONS OF TREATMENT: none  FOLLOW UP COMPLIANCE: keeps appointments   PHYSICAL EXAM:  BP 124/62   Pulse 94   Temp (!) 97.2 F (36.2 C)   Resp 18   Wt 270 lb 13.4 oz (122.8 kg)   BMI 37.77 kg/m  Well-developed well-nourished patient in NAD. HEENT reveals PERLA, EOMI, discs not visualized.  Oral cavity is clear. No oral mucosal lesions are identified. Neck is clear without evidence of cervical or supraclavicular adenopathy. Lungs are clear to A&P. Cardiac examination is essentially unremarkable with regular rate and rhythm without murmur rub or thrill. Abdomen is benign with no organomegaly or masses noted. Motor sensory and DTR levels are equal and symmetric in the upper and lower extremities. Cranial nerves II through XII are grossly intact. Proprioception is intact. No peripheral adenopathy or edema is identified. No motor or sensory levels are noted. Crude visual fields are within normal range.  RADIOLOGY RESULTS: CT scans reviewed and compatible with the above-stated findings  PLAN: present time is a complete response from  SB RT. I'm please was overall progress. He continues chemotherapy for his locally advanced rectal cancer. I have asked to see him back in 6 months for follow-up will obtain a CT scan prior to that visit and then ago once your follow-up visits. Patient Has my treatment plan well.  I would like to take this opportunity to thank you for allowing me to participate in the care of your patient.Noreene Filbert, MD

## 2017-07-18 DIAGNOSIS — R739 Hyperglycemia, unspecified: Secondary | ICD-10-CM | POA: Insufficient documentation

## 2017-07-20 ENCOUNTER — Ambulatory Visit: Payer: Medicare Other | Admitting: Family Medicine

## 2017-07-21 ENCOUNTER — Telehealth: Payer: Self-pay

## 2017-07-21 ENCOUNTER — Encounter: Payer: Self-pay | Admitting: Family Medicine

## 2017-07-21 ENCOUNTER — Ambulatory Visit: Payer: Medicare Other | Admitting: Family Medicine

## 2017-07-21 ENCOUNTER — Telehealth: Payer: Self-pay | Admitting: Family Medicine

## 2017-07-21 VITALS — BP 136/68 | HR 91 | Temp 97.8°F | Resp 14 | Ht 71.0 in | Wt 270.7 lb

## 2017-07-21 DIAGNOSIS — R5383 Other fatigue: Secondary | ICD-10-CM | POA: Diagnosis not present

## 2017-07-21 DIAGNOSIS — N182 Chronic kidney disease, stage 2 (mild): Secondary | ICD-10-CM | POA: Diagnosis not present

## 2017-07-21 DIAGNOSIS — F418 Other specified anxiety disorders: Secondary | ICD-10-CM | POA: Diagnosis not present

## 2017-07-21 DIAGNOSIS — M1000 Idiopathic gout, unspecified site: Secondary | ICD-10-CM | POA: Diagnosis not present

## 2017-07-21 DIAGNOSIS — C3412 Malignant neoplasm of upper lobe, left bronchus or lung: Secondary | ICD-10-CM

## 2017-07-21 DIAGNOSIS — D509 Iron deficiency anemia, unspecified: Secondary | ICD-10-CM | POA: Diagnosis not present

## 2017-07-21 DIAGNOSIS — E1121 Type 2 diabetes mellitus with diabetic nephropathy: Secondary | ICD-10-CM | POA: Diagnosis not present

## 2017-07-21 DIAGNOSIS — I700 Atherosclerosis of aorta: Secondary | ICD-10-CM | POA: Diagnosis not present

## 2017-07-21 DIAGNOSIS — C184 Malignant neoplasm of transverse colon: Secondary | ICD-10-CM

## 2017-07-21 DIAGNOSIS — I10 Essential (primary) hypertension: Secondary | ICD-10-CM | POA: Diagnosis not present

## 2017-07-21 DIAGNOSIS — G620 Drug-induced polyneuropathy: Secondary | ICD-10-CM

## 2017-07-21 DIAGNOSIS — T451X5A Adverse effect of antineoplastic and immunosuppressive drugs, initial encounter: Secondary | ICD-10-CM

## 2017-07-21 DIAGNOSIS — R4589 Other symptoms and signs involving emotional state: Secondary | ICD-10-CM

## 2017-07-21 DIAGNOSIS — E559 Vitamin D deficiency, unspecified: Secondary | ICD-10-CM

## 2017-07-21 DIAGNOSIS — I2510 Atherosclerotic heart disease of native coronary artery without angina pectoris: Secondary | ICD-10-CM

## 2017-07-21 MED ORDER — PANTOPRAZOLE SODIUM 20 MG PO TBEC: 20.0000 mg | DELAYED_RELEASE_TABLET | Freq: Every day | ORAL | 1 refills | Status: DC

## 2017-07-21 MED ORDER — ALLOPURINOL 300 MG PO TABS: 300.0000 mg | ORAL_TABLET | Freq: Every day | ORAL | 1 refills | Status: DC

## 2017-07-21 MED ORDER — QUINAPRIL HCL 5 MG PO TABS: 5.0000 mg | ORAL_TABLET | Freq: Every day | ORAL | 1 refills | Status: DC

## 2017-07-21 MED ORDER — ROSUVASTATIN CALCIUM 10 MG PO TABS: 10.0000 mg | ORAL_TABLET | Freq: Every day | ORAL | 1 refills | Status: DC

## 2017-07-21 MED ORDER — DOXYCYCLINE HYCLATE 100 MG PO TABS: 100.0000 mg | ORAL_TABLET | Freq: Two times a day (BID) | ORAL | 0 refills | Status: DC

## 2017-07-21 MED ORDER — GLIPIZIDE ER 10 MG PO TB24: 10.0000 mg | ORAL_TABLET | Freq: Two times a day (BID) | ORAL | 1 refills | Status: DC

## 2017-07-21 MED ORDER — METFORMIN HCL 1000 MG PO TABS: 1000.0000 mg | ORAL_TABLET | Freq: Two times a day (BID) | ORAL | 1 refills | Status: DC

## 2017-07-21 MED ORDER — REPAGLINIDE 0.5 MG PO TABS: 0.5000 mg | ORAL_TABLET | Freq: Three times a day (TID) | ORAL | 2 refills | Status: DC

## 2017-07-21 MED ORDER — COLESEVELAM HCL 625 MG PO TABS: 625.0000 mg | ORAL_TABLET | Freq: Two times a day (BID) | ORAL | 5 refills | Status: DC

## 2017-07-21 NOTE — Assessment & Plan Note (Signed)
Discussed from CT scan

## 2017-07-21 NOTE — Assessment & Plan Note (Signed)
Check lipids 

## 2017-07-21 NOTE — Assessment & Plan Note (Addendum)
Check level 

## 2017-07-21 NOTE — Assessment & Plan Note (Signed)
Monitor creatinine

## 2017-07-21 NOTE — Progress Notes (Signed)
BP 136/68   Pulse 91   Temp 97.8 F (36.6 C) (Oral)   Resp 14   Ht 5\' 11"  (1.803 m)   Wt 270 lb 11.2 oz (122.8 kg)   SpO2 96%   BMI 37.75 kg/m    Subjective:    Patient ID: Donald Pou., male    DOB: Oct 01, 1945, 72 y.o.   MRN: [id number]  HPI: Donald Kronberg. is a 72 y.o. male  Chief Complaint  Patient presents with  . Follow-up  . Medication Refill    HPI Patient is here for follow-up He has type 2 diabetes mellitus; sugars have been up and down; 315 this morning; chemo will shoot it up to 384; he have taken Januvia before and had side effects; he is not interested in insulin; he does not want Actos Hypertension; on meds Gout; no flares High cholesterol; eats what he wants Morbid obesity (BMI > 35 with comorbidities)  He has colon cancer and is undergoing treatment for that, as well as lung cancer; he is tired of everything; food doesn't taste right; he has talked to his chemo doctor about things; he knows he is still control of his therapy and decisions Stopped up, never had allergies in his life, he's 62 and never had allergies Short of breath and hurting in the chest; like he has dust in his chest and lungs, can't get a clean breath of air Discussed the chest CT from 07/09/2017; extensive athero; fatty liver; "coughing this shit up"; central airway thickening  Depression screen [name] 2/9 07/21/2017 03/31/2017 03/22/2017 03/01/2017 01/20/2017  Decreased Interest 0 0 0 2 0  Down, Depressed, Hopeless 0 0 0 3 0  PHQ - 2 Score 0 0 0 5 0  Altered sleeping - - - 3 -  Tired, decreased energy - - - 3 -  Change in appetite - - - 0 -  Feeling bad or failure about yourself  - - - 3 -  Trouble concentrating - - - 1 -  Moving slowly or fidgety/restless - - - 0 -  Suicidal thoughts - - - 3 -  PHQ-9 Score - - - 18 -  Difficult doing work/chores - - - Very difficult -    Relevant past medical, surgical, family and social history reviewed Past Medical History:  Diagnosis  Date  . Abdominal aortic atherosclerosis ([place]) 09/15/2016   CT scan June 2018  . Allergy   . Cancer ([place])   . Coronary artery calcification seen on CAT scan 09/15/2016   Previous smoker; noted on CT scan June 2018  . Diabetes mellitus without complication ([place])   . GERD (gastroesophageal reflux disease)   . Gout   . Hyperlipidemia   . Hypertension   . Kidney function abnormal    stage 2  . Kidney stones   . Obesity 08/20/2015   Past Surgical History:  Procedure Laterality Date  . COLONOSCOPY WITH PROPOFOL N/A 11/05/2016   Procedure: COLONOSCOPY WITH PROPOFOL;  Surgeon: Lucilla Lame, MD;  Location: [place];  Service: Gastroenterology;  Laterality: N/A;  . ELECTROMAGNETIC NAVIGATION BROCHOSCOPY N/A 12/16/2016   Procedure: ELECTROMAGNETIC NAVIGATION BRONCHOSCOPY;  Surgeon: Flora Lipps, MD;  Location: [facility];  Service: Cardiopulmonary;  Laterality: N/A;  . ESOPHAGOGASTRODUODENOSCOPY (EGD) WITH PROPOFOL N/A 11/05/2016   Procedure: ESOPHAGOGASTRODUODENOSCOPY (EGD) WITH PROPOFOL;  Surgeon: Lucilla Lame, MD;  Location: [place];  Service: Gastroenterology;  Laterality: N/A;  Diabetic - oral meds  . kidney stones    .  PARTIAL COLECTOMY N/A 12/28/2016   Procedure: PARTIAL COLECTOMY RIGHT;  Surgeon: Jules Husbands, MD;  Location: [facility];  Service: General;  Laterality: N/A;  . POLYPECTOMY N/A 11/05/2016   Procedure: POLYPECTOMY;  Surgeon: Lucilla Lame, MD;  Location: [place];  Service: Gastroenterology;  Laterality: N/A;  . PORTACATH PLACEMENT Right 01/18/2017   Procedure: INSERTION PORT-A-CATH;  Surgeon: Jules Husbands, MD;  Location: [facility];  Service: General;  Laterality: Right;   Family History  Problem Relation Age of Onset  . Alzheimer's disease Mother   . Heart disease Father   . Diabetes Father   . Cancer Father        Prostate  . Cancer Sister        skin cancer   Social History   Tobacco Use  . Smoking status: Former Smoker    Types:  Cigarettes    Last attempt to quit: 06/2016    Years since quitting: 1.1  . Smokeless tobacco: Never Used  Substance Use Topics  . Alcohol use: No    Alcohol/week: 0.0 oz  . Drug use: No    Interim medical history since last visit reviewed. Allergies and medications reviewed  Review of Systems  Constitutional: Negative for unexpected weight change.  HENT: Positive for postnasal drip.   Respiratory: Positive for shortness of breath.   Musculoskeletal: Positive for arthralgias (knees).  Skin:       Skin on the hands is red  Neurological:       Cold neuropathy from chemo   Per HPI unless specifically indicated above     Objective:    BP 136/68   Pulse 91   Temp 97.8 F (36.6 C) (Oral)   Resp 14   Ht 5\' 11"  (1.803 m)   Wt 270 lb 11.2 oz (122.8 kg)   SpO2 96%   BMI 37.75 kg/m   Wt Readings from Last 3 Encounters:  07/21/17 270 lb 11.2 oz (122.8 kg)  07/14/17 270 lb 13.4 oz (122.8 kg)  07/12/17 273 lb 9.6 oz (124.1 kg)    Physical Exam  Constitutional: He appears well-developed and well-nourished. No distress.  Morbidly obese  HENT:  Head: Normocephalic and atraumatic.  Eyes: EOM are normal. No scleral icterus.  Neck: No thyromegaly present.  Cardiovascular: Normal rate and regular rhythm.  Pulmonary/Chest: Effort normal. He has no decreased breath sounds. He has wheezes (expiratory).  Abdominal: Soft. Bowel sounds are normal. He exhibits no distension.  Musculoskeletal: He exhibits no edema.  Neurological: Coordination normal.  Skin: Skin is warm and dry. No pallor.  Psychiatric: He has a normal mood and affect. His behavior is normal. Judgment and thought content normal.   Diabetic Foot Form - Detailed   Diabetic Foot Exam - detailed Diabetic Foot exam was performed with the following findings:  Yes 07/21/2017  8:29 AM  Visual Foot Exam completed.:  Yes  Pulse Foot Exam completed.:  Yes  Right Dorsalis Pedis:  Present Left Dorsalis Pedis:  Present  Sensory  Foot Exam Completed.:  Yes Semmes-Weinstein Monofilament Test R Site 1-Great Toe:  Pos L Site 1-Great Toe:  Pos        Results for orders placed or performed in visit on 07/12/17  Ferritin  Result Value Ref Range   Ferritin 49 24 - 336 ng/mL  Magnesium  Result Value Ref Range   Magnesium 1.7 1.7 - 2.4 mg/dL  Comprehensive metabolic panel  Result Value Ref Range   Sodium 136 135 - 145  mmol/L   Potassium 4.4 3.5 - 5.1 mmol/L   Chloride 105 101 - 111 mmol/L   CO2 22 22 - 32 mmol/L   Glucose, Bld 322 (H) 65 - 99 mg/dL   BUN 18 6 - 20 mg/dL   Creatinine, Ser 0.94 0.61 - 1.24 mg/dL   Calcium 9.1 8.9 - 10.3 mg/dL   Total Protein 6.7 6.5 - 8.1 g/dL   Albumin 3.6 3.5 - 5.0 g/dL   AST 41 15 - 41 U/L   ALT 36 17 - 63 U/L   Alkaline Phosphatase 69 38 - 126 U/L   Total Bilirubin 0.4 0.3 - 1.2 mg/dL   GFR calc non Af Amer >60 >60 mL/min   GFR calc Af Amer >60 >60 mL/min   Anion gap 9 5 - 15  CBC with Differential  Result Value Ref Range   WBC 3.7 (L) 3.8 - 10.6 K/uL   RBC 3.47 (L) 4.40 - 5.90 MIL/uL   Hemoglobin 12.1 (L) 13.0 - 18.0 g/dL   HCT 35.1 (L) 40.0 - 52.0 %   MCV 100.9 (H) 80.0 - 100.0 fL   MCH 34.8 (H) 26.0 - 34.0 pg   MCHC 34.5 32.0 - 36.0 g/dL   RDW 17.7 (H) 11.5 - 14.5 %   Platelets 103 (L) 150 - 440 K/uL   Neutrophils Relative % 55 %   Neutro Abs 2.1 1.4 - 6.5 K/uL   Lymphocytes Relative 33 %   Lymphs Abs 1.2 1.0 - 3.6 K/uL   Monocytes Relative 9 %   Monocytes Absolute 0.3 0.2 - 1.0 K/uL   Eosinophils Relative 2 %   Eosinophils Absolute 0.1 0 - 0.7 K/uL   Basophils Relative 1 %   Basophils Absolute 0.0 0 - 0.1 K/uL      Assessment & Plan:   Problem List Items Addressed This Visit      Cardiovascular and Mediastinum   Essential hypertension, benign    Fair control today      Relevant Medications   colesevelam (WELCHOL) 625 MG tablet   rosuvastatin (CRESTOR) 10 MG tablet   quinapril (ACCUPRIL) 5 MG tablet   Coronary artery calcification seen on CAT  scan    Discussed from CT scan      Relevant Medications   colesevelam (WELCHOL) 625 MG tablet   rosuvastatin (CRESTOR) 10 MG tablet   quinapril (ACCUPRIL) 5 MG tablet   Abdominal aortic atherosclerosis (HCC)    Check lipids      Relevant Medications   colesevelam (WELCHOL) 625 MG tablet   rosuvastatin (CRESTOR) 10 MG tablet   quinapril (ACCUPRIL) 5 MG tablet   Other Relevant Orders   Lipid panel     Respiratory   Malignant neoplasm of upper lobe of left lung (HCC)    Under the care of oncologist      Relevant Medications   doxycycline (VIBRA-TABS) 100 MG tablet   allopurinol (ZYLOPRIM) 300 MG tablet     Digestive   Malignant neoplasm of transverse colon (HCC)    Under the care of oncologist      Relevant Medications   doxycycline (VIBRA-TABS) 100 MG tablet   allopurinol (ZYLOPRIM) 300 MG tablet     Endocrine   Controlled diabetes mellitus with diabetic nephropathy (HCC) - Primary    He does not want to take insulin or Actos or Januvia; we'll see if the welchol is affordable and if not, try something else      Relevant Medications   rosuvastatin (CRESTOR)  10 MG tablet   quinapril (ACCUPRIL) 5 MG tablet   metFORMIN (GLUCOPHAGE) 1000 MG tablet   glipiZIDE (GLIPIZIDE XL) 10 MG 24 hr tablet   Other Relevant Orders   Hemoglobin A1c   Lipid panel   Urine Microalbumin w/creat. ratio     Genitourinary   Chronic kidney disease (CKD), stage II (mild) (Chronic)    Monitor creatinine        Other   Vitamin D deficiency    Check vit D level      Relevant Orders   VITAMIN D 25 Hydroxy (Vit-D Deficiency, Fractures)   Morbid obesity (HCC)    BMI >35 with comorbidities      Relevant Medications   metFORMIN (GLUCOPHAGE) 1000 MG tablet   glipiZIDE (GLIPIZIDE XL) 10 MG 24 hr tablet   Other Relevant Orders   TSH   Iron deficiency anemia    Monitored by cancer doctor      Hypomagnesemia    Check level      Gout (Chronic)    No flares      Relevant  Orders   Uric acid   Anxiety about health    Not interested in medicine when recommended; he declines seeing a counselor       Other Visit Diagnoses    Other fatigue       offered B12 shot; check B12 level   Relevant Orders   Vitamin B12   Neuropathy due to chemotherapeutic drug ([place])       check B12   Relevant Orders   Vitamin B12       Follow up plan: Return in about 1 month (around 08/21/2017) for follow-up visit with Dr. Sanda Klein (nonfasting).  An after-visit summary was printed and given to the patient at [place].  Please see the patient instructions which may contain other information and recommendations beyond what is mentioned above in the assessment and plan.  Meds ordered this encounter  Medications  . colesevelam (WELCHOL) 625 MG tablet    Sig: Take 1 tablet (625 mg total) by mouth 2 (two) times daily with a meal.    Dispense:  60 tablet    Refill:  5    Please let patient know ahead of time how much this will cost him; if too expensive, call me or send me a note  . doxycycline (VIBRA-TABS) 100 MG tablet    Sig: Take 1 tablet (100 mg total) by mouth 2 (two) times daily.    Dispense:  20 tablet    Refill:  0  . rosuvastatin (CRESTOR) 10 MG tablet    Sig: Take 1 tablet (10 mg total) by mouth at bedtime.    Dispense:  90 tablet    Refill:  1  . quinapril (ACCUPRIL) 5 MG tablet    Sig: Take 1 tablet (5 mg total) by mouth daily.    Dispense:  90 tablet    Refill:  1    Please consider 90 day supplies to promote better adherence  . pantoprazole (PROTONIX) 20 MG tablet    Sig: Take 1 tablet (20 mg total) by mouth daily.    Dispense:  90 tablet    Refill:  1  . metFORMIN (GLUCOPHAGE) 1000 MG tablet    Sig: Take 1 tablet (1,000 mg total) by mouth 2 (two) times daily with a meal.    Dispense:  180 tablet    Refill:  1    Please consider 90 day supplies to promote better adherence  .  glipiZIDE (GLIPIZIDE XL) 10 MG 24 hr tablet    Sig: Take 1 tablet (10 mg total) by  mouth 2 (two) times daily.    Dispense:  180 tablet    Refill:  1  . allopurinol (ZYLOPRIM) 300 MG tablet    Sig: Take 1 tablet (300 mg total) by mouth daily.    Dispense:  90 tablet    Refill:  1    Orders Placed This Encounter  Procedures  . Vitamin B12  . VITAMIN D 25 Hydroxy (Vit-D Deficiency, Fractures)  . TSH  . Hemoglobin A1c  . Lipid panel  . Uric acid  . Urine Microalbumin w/creat. ratio   Face-to-face time with patient was more than 40 minutes, >50% time spent counseling and coordination of care

## 2017-07-21 NOTE — Assessment & Plan Note (Signed)
Under the care of oncologist

## 2017-07-21 NOTE — Telephone Encounter (Signed)
Copied from [place] [id number]. Topic: General - Other >> Jul 21, 2017  4:12 PM Yvette Rack wrote: Reason for CRM: Becky with [employer] called in regarding well call. Jacqlyn Larsen stated they have one additional question to be answered so they can make a determination. Becky asked if the pt have primary hypercholesterolemia. Cb# [phone] Option 5.  Spoke to Horine and she to give the requested information and she said that we will have an answer tomorrow.

## 2017-07-21 NOTE — Assessment & Plan Note (Signed)
Fair control today

## 2017-07-21 NOTE — Telephone Encounter (Signed)
Talked with patient about the diabetes medicine Discussed the doxy; take with full glass of water

## 2017-07-21 NOTE — Assessment & Plan Note (Signed)
BMI >35 with comorbidities

## 2017-07-21 NOTE — Patient Instructions (Addendum)
Try going dairy-free for one month to see if that helps your drainage and congestion Start antibiotics Please do eat yogurt or kimchi or take a probiotic daily for the next month We want to replace the healthy germs in the gut If you notice foul, watery diarrhea in the next two months, schedule an appointment RIGHT AWAY or go to an urgent care or the emergency room if a holiday or over a weekend  I am right here with you. Please let me know anything that's bothering you. I am on your team.

## 2017-07-21 NOTE — Assessment & Plan Note (Signed)
He does not want to take insulin or Actos or Januvia; we'll see if the welchol is affordable and if not, try something else

## 2017-07-21 NOTE — Assessment & Plan Note (Signed)
No flares.

## 2017-07-21 NOTE — Assessment & Plan Note (Signed)
Monitored by cancer doctor

## 2017-07-21 NOTE — Assessment & Plan Note (Signed)
Check vit D level. 

## 2017-07-21 NOTE — Assessment & Plan Note (Addendum)
Not interested in medicine when recommended; he declines seeing a counselor

## 2017-07-21 NOTE — Telephone Encounter (Signed)
Copied from [place] [id number]. Topic: Inquiry >> Jul 21, 2017  4:51 PM Neva Seat wrote: Pt is needing to talk to Dr. Sanda Klein if possible, asap.  Pt has a concern regarding today's medication prescribed.

## 2017-07-22 ENCOUNTER — Other Ambulatory Visit: Payer: Self-pay | Admitting: Family Medicine

## 2017-07-22 LAB — MICROALBUMIN / CREATININE URINE RATIO
Creatinine, Urine: 149 mg/dL (ref 20–320)
Microalb Creat Ratio: 32 mcg/mg creat — ABNORMAL HIGH (ref <30)
Microalb, Ur: 4.7 mg/dL

## 2017-07-22 LAB — LIPID PANEL
CHOLESTEROL: 113 mg/dL (ref <200)
HDL: 48 mg/dL (ref >40)
LDL Cholesterol (Calc): 45 mg/dL (calc)
Non-HDL Cholesterol (Calc): 65 mg/dL (calc) (ref <130)
Total CHOL/HDL Ratio: 2.4 (calc) (ref <5.0)
Triglycerides: 117 mg/dL (ref <150)

## 2017-07-22 LAB — VITAMIN B12: Vitamin B-12: 625 pg/mL (ref 200–1100)

## 2017-07-22 LAB — TSH: TSH: 2.66 m[IU]/L (ref 0.40–4.50)

## 2017-07-22 LAB — HEMOGLOBIN A1C
EAG (MMOL/L): 13 (calc)
HEMOGLOBIN A1C: 9.8 %{Hb} — AB (ref <5.7)
MEAN PLASMA GLUCOSE: 235 (calc)

## 2017-07-22 LAB — VITAMIN D 25 HYDROXY (VIT D DEFICIENCY, FRACTURES): VIT D 25 HYDROXY: 19 ng/mL — AB (ref 30–100)

## 2017-07-22 LAB — URIC ACID: Uric Acid, Serum: 3.6 mg/dL — ABNORMAL LOW (ref 4.0–8.0)

## 2017-07-22 MED ORDER — VITAMIN D (ERGOCALCIFEROL) 1.25 MG (50000 UNIT) PO CAPS: 50000.0000 [IU] | ORAL_CAPSULE | ORAL | 1 refills | Status: DC

## 2017-07-22 NOTE — Progress Notes (Signed)
Start Rx vit D

## 2017-07-23 ENCOUNTER — Telehealth: Payer: Self-pay | Admitting: Family Medicine

## 2017-07-23 ENCOUNTER — Other Ambulatory Visit: Payer: Self-pay | Admitting: Hematology and Oncology

## 2017-07-23 ENCOUNTER — Encounter: Payer: Self-pay | Admitting: Family Medicine

## 2017-07-23 NOTE — Telephone Encounter (Signed)
Will they cover starlix?

## 2017-07-23 NOTE — Telephone Encounter (Signed)
Copied from [place] [phone]. Topic: Quick Communication - See Telephone Encounter >> Jul 23, 2017 11:50 AM Synthia Innocent wrote: CRM for notification. See Telephone encounter for: 07/23/17. PA for repaglinide (PRANDIN) 0.5 MG tablet has been denied

## 2017-07-25 NOTE — Progress Notes (Signed)
[place] [facility] day:  07/26/2017  Chief Complaint: Donald Mcdowell. is a 72 y.o. male with stage IIIB colon cancer who is seen for 2 week assessment prior to cycle #10 FOLFOX chemotherapy.  HPI:   The patient was last seen in the medical oncology clinic on 07/12/2017.  At that time, patient was doing well overall. He denied nausea, vomiting, and changes to his bowel habits. He described a chronic productive cough (clear sputum). Neuropathy had resolved.  Exam was stable.  WBC was 3700 ([place] 2100). Platelets 103,000.  Blood sugar is 322. He received cycle #9 FOLFOX.   Patient was seen in follow up consult by Dr. Noreene Filbert (radiation oncology). He was pleased with overall progress. Patient to follow up with radiation oncology in 6 months, with CT imaging just prior to being seen.   He was seen by Dr. Sanda Klein (PCP) on 07/21/2017. Patient complains of shortness of breath. He was wheezing and had congestion. He was put on a 10 day course of Doxycycline. Poor glycemic control was discussed. Patient refused to consider insulin, Actos, or Januvia. Welchol was prescribed, however insurance would not cover. PCP checking into approval for Starlix. Anxiety related to health status was discussed. PCP offered anxiolytic and counseling, however patient declined both interventions.   In the interim, patient is doing well. He continues to have cough. He is taking Doxycycline. Patient denies nausea, vomiting, diarrhea, and fevers. He has no B symptoms. He has an intermittent cold neuropathy. He is eating "ok". His weight is down 2 pounds.   Patient denies pain in the clinic today.    Past Medical History:  Diagnosis Date  . Abdominal aortic atherosclerosis ([place]) 09/15/2016   CT scan June 2018  . Allergy   . Cancer ([place])   . Coronary artery calcification seen on CAT scan 09/15/2016   Previous smoker; noted on CT scan June 2018  . Diabetes mellitus without complication  ([place])   . GERD (gastroesophageal reflux disease)   . Gout   . Hyperlipidemia   . Hypertension   . Kidney function abnormal    stage 2  . Kidney stones   . Obesity 08/20/2015    Past Surgical History:  Procedure Laterality Date  . COLONOSCOPY WITH PROPOFOL N/A 11/05/2016   Procedure: COLONOSCOPY WITH PROPOFOL;  Surgeon: Lucilla Lame, MD;  Location: [place] [facility];  Service: Gastroenterology;  Laterality: N/A;  . ELECTROMAGNETIC NAVIGATION BROCHOSCOPY N/A 12/16/2016   Procedure: ELECTROMAGNETIC NAVIGATION BRONCHOSCOPY;  Surgeon: Flora Lipps, MD;  Location: [facility];  Service: Cardiopulmonary;  Laterality: N/A;  . ESOPHAGOGASTRODUODENOSCOPY (EGD) WITH PROPOFOL N/A 11/05/2016   Procedure: ESOPHAGOGASTRODUODENOSCOPY (EGD) WITH PROPOFOL;  Surgeon: Lucilla Lame, MD;  Location: [facility];  Service: Gastroenterology;  Laterality: N/A;  Diabetic - oral meds  . kidney stones    . PARTIAL COLECTOMY N/A 12/28/2016   Procedure: PARTIAL COLECTOMY RIGHT;  Surgeon: Jules Husbands, MD;  Location: [facility];  Service: General;  Laterality: N/A;  . POLYPECTOMY N/A 11/05/2016   Procedure: POLYPECTOMY;  Surgeon: Lucilla Lame, MD;  Location: [facility];  Service: Gastroenterology;  Laterality: N/A;  . PORTACATH PLACEMENT Right 01/18/2017   Procedure: INSERTION PORT-A-CATH;  Surgeon: Jules Husbands, MD;  Location: [facility];  Service: General;  Laterality: Right;    Family History  Problem Relation Age of Onset  . Alzheimer's disease Mother   . Heart disease Father   . Diabetes Father   . Cancer Father  Prostate  . Cancer Sister        skin cancer    Social History:  reports that he quit smoking about 13 months ago. His smoking use included cigarettes. He has never used smokeless tobacco. He reports that he does not drink alcohol or use drugs.  Patient worked for a [employer].  He retired in 1996.  He was in his 77s. Patient was a 1-2 pack per day smoker since he was a  child.  He stopped smoking in 07/2016.  He drank alcohol in the past.  He has 3 living sisters (1 sister died).  He has no children. Patient married to Murray.  Communicate with neighbor [name]  [phone]. The patient is alone today.   Allergies: No Known Allergies  Current Medications: Current Outpatient Medications  Medication Sig Dispense Refill  . allopurinol (ZYLOPRIM) 300 MG tablet Take 1 tablet (300 mg total) by mouth daily. 90 tablet 1  . Cyanocobalamin (VITAMIN B-12 PO) Take 1 tablet by mouth daily.    Marland Kitchen doxycycline (VIBRA-TABS) 100 MG tablet Take 1 tablet (100 mg total) by mouth 2 (two) times daily. 20 tablet 0  . glipiZIDE (GLIPIZIDE XL) 10 MG 24 hr tablet Take 1 tablet (10 mg total) by mouth 2 (two) times daily. 180 tablet 1  . glucose blood (ACCU-CHEK COMPACT PLUS) test strip Check blood sugars once a day if desired; LON 99 months; Dx E11.21 102 each 1  . glucose blood (ACCU-CHEK COMPACT STRIPS) test strip 100 each by Other route as needed for other. Use as instructed, check FSBS once a day; LON 99 months, E11.9 100 each 1  . magnesium oxide (MAG-OX) 400 MG tablet Take 1 tablet (400 mg total) by mouth daily. 90 tablet 1  . metFORMIN (GLUCOPHAGE) 1000 MG tablet Take 1 tablet (1,000 mg total) by mouth 2 (two) times daily with a meal. 180 tablet 1  . mupirocin ointment (BACTROBAN) 2 %   0  . pantoprazole (PROTONIX) 20 MG tablet Take 1 tablet (20 mg total) by mouth daily. 90 tablet 1  . quinapril (ACCUPRIL) 5 MG tablet Take 1 tablet (5 mg total) by mouth daily. 90 tablet 1  . repaglinide (PRANDIN) 0.5 MG tablet Take 1 tablet (0.5 mg total) by mouth 3 (three) times daily before meals. 90 tablet 2  . rosuvastatin (CRESTOR) 10 MG tablet Take 1 tablet (10 mg total) by mouth at bedtime. 90 tablet 1  . saw palmetto 160 MG capsule Take 160 mg 2 (two) times daily by mouth.    . tamsulosin (FLOMAX) 0.4 MG CAPS capsule Take 0.4 mg by mouth daily.    . Vitamin D, Ergocalciferol,  (DRISDOL) 50000 units CAPS capsule Take 1 capsule (50,000 Units total) by mouth every 7 (seven) days. 4 capsule 1  . lidocaine-prilocaine (EMLA) cream Apply to affected area once (Patient not taking: Reported on 07/12/2017) 30 g 3   No current facility-administered medications for this visit.    Facility-Administered Medications Ordered in Other Visits  Medication Dose Route Frequency Provider Last Rate Last Dose  . heparin lock flush 100 unit/mL  500 Units Intravenous Once Corcoran, Melissa C, MD      . sodium chloride flush (NS) 0.9 % injection 10 mL  10 mL Intravenous PRN Lequita Asal, MD   10 mL at 07/26/17 4193    Review of Systems  Constitutional: Positive for malaise/fatigue and weight loss (down 2 pounds). Negative for diaphoresis and fever.  HENT: Negative.  Negative for congestion, ear pain, nosebleeds and sore throat.   Eyes: Negative.  Negative for blurred vision, double vision, photophobia and redness.  Respiratory: Positive for cough, sputum production (clear) and shortness of breath. Negative for hemoptysis.   Cardiovascular: Negative.  Negative for chest pain, palpitations, orthopnea, leg swelling and PND.  Gastrointestinal: Negative.  Negative for abdominal pain, blood in stool, constipation, diarrhea, melena, nausea and vomiting.  Genitourinary: Negative.  Negative for dysuria, frequency, hematuria and urgency.  Musculoskeletal: Negative.  Negative for back pain, falls, joint pain and myalgias.  Skin: Negative.  Negative for itching and rash.  Neurological: Positive for tingling (intermittent cold neuropathy associated with chemotherapy). Negative for dizziness, tremors, weakness and headaches.  Endo/Heme/Allergies: Negative.  Does not bruise/bleed easily.       Diabetes; poor control  Psychiatric/Behavioral: Negative for depression and suicidal ideas. The patient is nervous/anxious (related to health state). The patient does not have insomnia.        Some mild memory  issues  All other systems reviewed and are negative.  Physical Exam: Blood pressure 122/73, pulse (!) 101, temperature 98 F (36.7 C), temperature source Tympanic, weight 271 lb 1 oz (123 kg). GENERAL:  Well developed, well nourished, heavyset gentleman sitting comfortably in the exam room in no acute distress. MENTAL STATUS:  Alert and oriented to person, place and time. HEAD:  Wearing a black cap.  Thin gray hair.  Male pattern baldness.  Normocephalic, atraumatic, face symmetric, no Cushingoid features. EYES:  Glasses. Blue eyes.  Pupils equal round and reactive to light and accomodation.  No conjunctivitis or scleral icterus. ENT:  Oropharynx clear without lesion.  Tongue normal. Mucous membranes moist.  RESPIRATORY:  Clear to auscultation without rales, wheezes or rhonchi. CARDIOVASCULAR:  Regular rate and rhythm without murmur, rub or gallop. ABDOMEN:  Soft, non-tender, with active bowel sounds, and no hepatosplenomegaly.  No masses. SKIN:  No rashes, ulcers or lesions. EXTREMITIES: No edema, no skin discoloration or tenderness.  No palpable cords. LYMPH NODES: No palpable cervical, supraclavicular, axillary or inguinal adenopathy  NEUROLOGICAL: Unremarkable. PSYCH:  Appropriate.   Infusion on 07/26/2017  Component Date Value Ref Range Status  . Magnesium 07/26/2017 1.5* 1.7 - 2.4 mg/dL Final   Performed at [facility], [address], [place], [place] [address]  . Sodium 07/26/2017 137  135 - 145 mmol/L Final  . Potassium 07/26/2017 4.2  3.5 - 5.1 mmol/L Final  . Chloride 07/26/2017 105  101 - 111 mmol/L Final  . CO2 07/26/2017 22  22 - 32 mmol/L Final  . Glucose, Bld 07/26/2017 243* 65 - 99 mg/dL Final  . BUN 07/26/2017 21* 6 - 20 mg/dL Final  . Creatinine, Ser 07/26/2017 1.12  0.61 - 1.24 mg/dL Final  . Calcium 07/26/2017 9.3  8.9 - 10.3 mg/dL Final  . Total Protein 07/26/2017 6.8  6.5 - 8.1 g/dL Final  . Albumin 07/26/2017 3.6  3.5 - 5.0 g/dL Final  . AST  07/26/2017 38  15 - 41 U/L Final  . ALT 07/26/2017 35  17 - 63 U/L Final  . Alkaline Phosphatase 07/26/2017 66  38 - 126 U/L Final  . Total Bilirubin 07/26/2017 0.5  0.3 - 1.2 mg/dL Final  . GFR calc non Af Amer 07/26/2017 >60  >60 mL/min Final  . GFR calc Af Amer 07/26/2017 >60  >60 mL/min Final   Comment: (NOTE) The eGFR has been calculated using the CKD EPI equation. This calculation has not been validated in all  clinical situations. eGFR's persistently <60 mL/min signify possible Chronic Kidney Disease.   Georgiann Hahn gap 07/26/2017 10  5 - 15 Final   Performed at [facility], [address] [place], [place] [address]  . WBC 07/26/2017 4.0  3.8 - 10.6 K/uL Final  . RBC 07/26/2017 3.56* 4.40 - 5.90 MIL/uL Final  . Hemoglobin 07/26/2017 12.3* 13.0 - 18.0 g/dL Final  . HCT 07/26/2017 35.6* 40.0 - 52.0 % Final  . MCV 07/26/2017 100.0  80.0 - 100.0 fL Final  . MCH 07/26/2017 34.6* 26.0 - 34.0 pg Final  . MCHC 07/26/2017 34.6  32.0 - 36.0 g/dL Final  . RDW 07/26/2017 17.3* 11.5 - 14.5 % Final  . Platelets 07/26/2017 81* 150 - 440 K/uL Final  . Neutrophils Relative % 07/26/2017 51  % Final  . Neutro Abs 07/26/2017 2.0  1.4 - 6.5 K/uL Final  . Lymphocytes Relative 07/26/2017 37  % Final  . Lymphs Abs 07/26/2017 1.5  1.0 - 3.6 K/uL Final  . Monocytes Relative 07/26/2017 9  % Final  . Monocytes Absolute 07/26/2017 0.4  0.2 - 1.0 K/uL Final  . Eosinophils Relative 07/26/2017 2  % Final  . Eosinophils Absolute 07/26/2017 0.1  0 - 0.7 K/uL Final  . Basophils Relative 07/26/2017 1  % Final  . Basophils Absolute 07/26/2017 0.0  0 - 0.1 K/uL Final   Performed at [facility], [address] [place], [place] [address]    Assessment:  Eliazer Hemphill. is a 72 y.o. male with stage IIIB transverse colon cancer s/p right partial colectomy on 12/28/2016.  Pathology revealed a 5.5 cm grade II invasive adenocarcinoma of the transverse colon, extending through the muscularis propria  and into the adjacent adipose tissue, including the greater omentum. There was metastatic adenocarcinoma in multiple lymph nodes and tumor deposits, at least 5 involved with a total of at least 18 lymph nodes (5 of 18).  Pathologic stage was T3N2a.  CEA was 1.4 on 11/09/2016.  Abdomen and pelvic CT on 11/09/2016 revealed mid transverse colon segment of luminal narrowing with wall thickening compatible with colon cancer.  There was a single enlarged adjacent mesenteric lymph node and prominent subcentimeter lymph nodes anterior to the pancreas which may be metastatic.  There was no evidence of distant metastasis.  There was a left kidney lower pole complex cyst with thin internal septations (Bosniak IIF).  His prostate was enlarged and extended into the floor of bladder.  There was right kidney lower pole punctate nonobstructing nephrolithiasis.  Chest CT on 11/18/2016 revealed no metastatic disease.  There was abrupt termination of the left upper lobe beyond which the distal bronchus appeared dilated with mild branching into the lung parenchyma.  Imaging suggested a bronchocele in the medial aspect of the left upper lobe. Differential included an endobronchial polyp, endobronchial tumor, bronchial stricture or aspirated foreign body.   Electromagnetic navigational bronchoscopy (ENB) on 12/16/2016 revealed non-small cell carcinoma, favor squamous cell lung carcinoma.  Tumor was positive for p40 and negative for cdx-2 and TTF-1.  He received SBRT to the left upper lobe of 5000 cGy in 5 fractions from 02/16/2017 - 03/02/2017.  Chest CT on 07/09/2017  revealed no residual LUL pulmonary nodule following SBRT treatment. There were no new pulmonary nodules observed.Small distal paraesophageal nodes stable in appearance. Study negative for thoracic metastatic disease.   PET scan on 01/26/2017 revealed a 2.4 x 0.9 cm hypermetabolic medial left upper lobe pulmonary nodule, corresponding to known primary  bronchogenic  neoplasm. No findings suspicious for metastatic disease.  EGD on 11/05/2016 revealed gastritis.  Pathology confirmed chronic mild gastritis and features of a healing erosion in the stomach.  There was no H pylori, dysplasia or malignancy.  Echocardiogram  On 12/15/2016 revealed mild to moderate aortic valve stenosis. There was abnormal LV relaxation consistent with grade I diastolic dysfunction. Estimated EF was 55-60%.  He is s/p 9 cycles of FOLFOX chemotherapy (03/15/2017 - 07/12/2017).  Cycle #8 was held on 06/21/2017 due to low platelet count of 95,000. He has otherwise tolerated chemotherapy well.  He has iron deficiency anemia.  He is on oral iron.  Ferritin was 6 on 09/04/2016.  Hematocrit was 29.7 and hemoglobin 8.8 on 09/04/2016.  Hematocrit was 28.1, hemoglobin 9.3, and MCV 87.9 on 11/09/2016.   Ferritin has been followed: 28 on 10/13/2016, 15 on 01/14/2017, 21 on 03/29/2016, 20 on 04/12/2017, 24 on 04/26/2017, and 49 on 07/12/2017.   Symptomatically, he has a cough. He continues on Doxycycline. He has an intermittent cold neuropathy. He denies nausea, vomiting, and diarrhea.  Exam is stable.  WBC is 4000 ([place] 2000). Hemoglobin 12.3, hematocrit 35.6, and platelets 81,000.  Magnesium 1.5. Blood sugar 243.  Plan: 1.  Labs today: CBC with diff, CMP, Mg 2.  Discuss interval follow up with radiation oncology. Doing well.  CT imaging in 6 months to be ordered by Dr. Baruch Gouty. 3.  Discuss follow up with PCP. Diabetes poorly controlled. Declined offered interventions.  4.  Labs reviewed. Platelets low at 81,000. Will hold cycle #10 FOLFOX. 5.  Discuss routine post-treatment surveillance schedule post completion of chemotherapy. Patient will be seen in the medical oncology clinic every 3 months for the first year, every 4 months for year 2, every 6 months for years 3-5, and then annually thereafter.  6.  Discuss low magnesium.  Magnesium is 1.5 today. Refused IV Mg today. Will take oral Mg at  home.  7.  RTC on 08/02/2017 for MD assessment,  labs (CBC with diff, CMP, Mg, ferritin), and cycle #10 FOLFOX chemotherapy.   [name], NP  07/26/2017, 10:16 AM  I saw and evaluated the patient, participating in the key portions of the service and reviewing pertinent diagnostic studies and records.  I reviewed the nurse practitioner's note and agree with the findings and the plan.  The assessment and plan were discussed with the patient.  A few questions were asked by the patient and answered.   Nolon Stalls, MD 07/26/2017,10:16 AM

## 2017-07-26 ENCOUNTER — Inpatient Hospital Stay: Payer: Medicare Other

## 2017-07-26 ENCOUNTER — Inpatient Hospital Stay (HOSPITAL_BASED_OUTPATIENT_CLINIC_OR_DEPARTMENT_OTHER): Payer: Medicare Other | Admitting: Hematology and Oncology

## 2017-07-26 ENCOUNTER — Encounter: Payer: Self-pay | Admitting: Urgent Care

## 2017-07-26 ENCOUNTER — Encounter: Payer: Self-pay | Admitting: Hematology and Oncology

## 2017-07-26 VITALS — BP 122/73 | HR 101 | Temp 98.0°F | Wt 271.1 lb

## 2017-07-26 DIAGNOSIS — E1165 Type 2 diabetes mellitus with hyperglycemia: Secondary | ICD-10-CM | POA: Diagnosis not present

## 2017-07-26 DIAGNOSIS — Z5111 Encounter for antineoplastic chemotherapy: Secondary | ICD-10-CM

## 2017-07-26 DIAGNOSIS — C3412 Malignant neoplasm of upper lobe, left bronchus or lung: Secondary | ICD-10-CM | POA: Diagnosis not present

## 2017-07-26 DIAGNOSIS — R05 Cough: Secondary | ICD-10-CM | POA: Diagnosis not present

## 2017-07-26 DIAGNOSIS — D509 Iron deficiency anemia, unspecified: Secondary | ICD-10-CM

## 2017-07-26 DIAGNOSIS — C184 Malignant neoplasm of transverse colon: Secondary | ICD-10-CM

## 2017-07-26 DIAGNOSIS — C182 Malignant neoplasm of ascending colon: Secondary | ICD-10-CM

## 2017-07-26 LAB — COMPREHENSIVE METABOLIC PANEL
ALT: 35 U/L (ref 17–63)
AST: 38 U/L (ref 15–41)
Albumin: 3.6 g/dL (ref 3.5–5.0)
Alkaline Phosphatase: 66 U/L (ref 38–126)
Anion gap: 10 (ref 5–15)
BUN: 21 mg/dL — ABNORMAL HIGH (ref 6–20)
CO2: 22 mmol/L (ref 22–32)
Calcium: 9.3 mg/dL (ref 8.9–10.3)
Chloride: 105 mmol/L (ref 101–111)
Creatinine, Ser: 1.12 mg/dL (ref 0.61–1.24)
GFR calc Af Amer: >60 mL/min (ref >60)
GFR calc non Af Amer: >60 mL/min (ref >60)
Glucose, Bld: 243 mg/dL — ABNORMAL HIGH (ref 65–99)
Potassium: 4.2 mmol/L (ref 3.5–5.1)
Sodium: 137 mmol/L (ref 135–145)
Total Bilirubin: 0.5 mg/dL (ref 0.3–1.2)
Total Protein: 6.8 g/dL (ref 6.5–8.1)

## 2017-07-26 LAB — CBC WITH DIFFERENTIAL/PLATELET
Basophils Absolute: 0 10*3/uL (ref 0–0.1)
Basophils Relative: 1 %
Eosinophils Absolute: 0.1 10*3/uL (ref 0–0.7)
Eosinophils Relative: 2 %
HCT: 35.6 % — ABNORMAL LOW (ref 40.0–52.0)
Hemoglobin: 12.3 g/dL — ABNORMAL LOW (ref 13.0–18.0)
Lymphocytes Relative: 37 %
Lymphs Abs: 1.5 10*3/uL (ref 1.0–3.6)
MCH: 34.6 pg — ABNORMAL HIGH (ref 26.0–34.0)
MCHC: 34.6 g/dL (ref 32.0–36.0)
MCV: 100 fL (ref 80.0–100.0)
Monocytes Absolute: 0.4 10*3/uL (ref 0.2–1.0)
Monocytes Relative: 9 %
Neutro Abs: 2 10*3/uL (ref 1.4–6.5)
Neutrophils Relative %: 51 %
Platelets: 81 10*3/uL — ABNORMAL LOW (ref 150–440)
RBC: 3.56 MIL/uL — ABNORMAL LOW (ref 4.40–5.90)
RDW: 17.3 % — ABNORMAL HIGH (ref 11.5–14.5)
WBC: 4 10*3/uL (ref 3.8–10.6)

## 2017-07-26 LAB — MAGNESIUM: Magnesium: 1.5 mg/dL — ABNORMAL LOW (ref 1.7–2.4)

## 2017-07-26 MED ORDER — SODIUM CHLORIDE 0.9% FLUSH
10.0000 mL | INTRAVENOUS | Status: DC | PRN
  Administered 2017-07-26: 10 mL via INTRAVENOUS
  Filled 2017-07-26: qty 10

## 2017-07-26 MED ORDER — HEPARIN SOD (PORK) LOCK FLUSH 100 UNIT/ML IV SOLN
500.0000 [IU] | Freq: Once | INTRAVENOUS | Status: AC
  Administered 2017-07-26: 500 [IU] via INTRAVENOUS

## 2017-07-26 MED ORDER — HEPARIN SOD (PORK) LOCK FLUSH 100 UNIT/ML IV SOLN
INTRAVENOUS | Status: AC
  Filled 2017-07-26: qty 5

## 2017-07-26 NOTE — Telephone Encounter (Signed)
I contacted this patient insurance company and was informed that the generic for Starlix (nateglinide) is covered as long as the Rx written does not exeed 6 tablets/ day or 180 tablets/ month.  Please submit new prescription

## 2017-07-26 NOTE — Progress Notes (Signed)
Patient states he has cold sensitivity, otherwise, offers no complaints.

## 2017-07-28 MED ORDER — NATEGLINIDE 60 MG PO TABS: 60.0000 mg | ORAL_TABLET | Freq: Three times a day (TID) | ORAL | 0 refills | Status: DC

## 2017-07-30 ENCOUNTER — Encounter: Payer: Self-pay | Admitting: Family Medicine

## 2017-07-30 DIAGNOSIS — R339 Retention of urine, unspecified: Secondary | ICD-10-CM

## 2017-07-30 DIAGNOSIS — N401 Enlarged prostate with lower urinary tract symptoms: Principal | ICD-10-CM

## 2017-07-30 DIAGNOSIS — N281 Cyst of kidney, acquired: Secondary | ICD-10-CM

## 2017-07-30 DIAGNOSIS — N138 Other obstructive and reflux uropathy: Secondary | ICD-10-CM

## 2017-08-01 NOTE — Progress Notes (Signed)
[facility] day:  08/02/2017  Chief Complaint: Donald Siegert. is a 72 y.o. male with stage IIIB colon cancer who is seen for 1 week assessment prior to cycle #10 FOLFOX chemotherapy.  HPI:   The patient was last seen in the medical oncology clinic on 07/26/2017.  At that time, patient complained of a cough. He was on a course of Doxycycline.  He denies fevers. He had intermittent cold neuropathy. Exam was stable.  WBC was 4000 ([place] 2000).  Hemoglobin was 12.3, hematocrit 35.6, and platelets 81,000.  Magnesium was 1.5. Blood sugar 243. Cycle #10 FOLFOX was held due to thrombocytopenia. He declined IV Mg replacement and was to take oral supplement at home.  He was encouraged to continue his oral iron supplementation.   In the interim, patient is doing well today. Cough has improved following completion of the Doxycycline course. Patient never developed any fevers. Patient denies increased shortness of breath. He has not experienced any increased diaphoresis. Patient complains of neuropathy in his RIGHT foot.   Patient is eating well. Weight remains stable. Patient is taking oral iron BID and magnesium oxide 400 mg daily.   Patient denies pain in the clinic today.    Past Medical History:  Diagnosis Date  . Abdominal aortic atherosclerosis ([place]) 09/15/2016   CT scan June 2018  . Allergy   . Cancer ([place])   . Coronary artery calcification seen on CAT scan 09/15/2016   Previous smoker; noted on CT scan June 2018  . Diabetes mellitus without complication ([place])   . GERD (gastroesophageal reflux disease)   . Gout   . Hyperlipidemia   . Hypertension   . Kidney function abnormal    stage 2  . Kidney stones   . Obesity 08/20/2015    Past Surgical History:  Procedure Laterality Date  . COLONOSCOPY WITH PROPOFOL N/A 11/05/2016   Procedure: COLONOSCOPY WITH PROPOFOL;  Surgeon: Lucilla Lame, MD;  Location: [facility];  Service:  Gastroenterology;  Laterality: N/A;  . ELECTROMAGNETIC NAVIGATION BROCHOSCOPY N/A 12/16/2016   Procedure: ELECTROMAGNETIC NAVIGATION BRONCHOSCOPY;  Surgeon: Flora Lipps, MD;  Location: [facility];  Service: Cardiopulmonary;  Laterality: N/A;  . ESOPHAGOGASTRODUODENOSCOPY (EGD) WITH PROPOFOL N/A 11/05/2016   Procedure: ESOPHAGOGASTRODUODENOSCOPY (EGD) WITH PROPOFOL;  Surgeon: Lucilla Lame, MD;  Location: [facility];  Service: Gastroenterology;  Laterality: N/A;  Diabetic - oral meds  . kidney stones    . PARTIAL COLECTOMY N/A 12/28/2016   Procedure: PARTIAL COLECTOMY RIGHT;  Surgeon: Jules Husbands, MD;  Location: [facility];  Service: General;  Laterality: N/A;  . POLYPECTOMY N/A 11/05/2016   Procedure: POLYPECTOMY;  Surgeon: Lucilla Lame, MD;  Location: [place] [facility];  Service: Gastroenterology;  Laterality: N/A;  . PORTACATH PLACEMENT Right 01/18/2017   Procedure: INSERTION PORT-A-CATH;  Surgeon: Jules Husbands, MD;  Location: [facility];  Service: General;  Laterality: Right;    Family History  Problem Relation Age of Onset  . Alzheimer's disease Mother   . Heart disease Father   . Diabetes Father   . Cancer Father        Prostate  . Cancer Sister        skin cancer    Social History:  reports that he quit smoking about 13 months ago. His smoking use included cigarettes. He has never used smokeless tobacco. He reports that he does not drink alcohol or use drugs.  Patient worked for a [employer].  He  retired in 1996.  He was in his 22s. Patient was a 1-2 pack per day smoker since he was a child.  He stopped smoking in 07/2016.  He drank alcohol in the past.  He has 3 living sisters (1 sister died).  He has no children. Patient married to [place].  Communicate with neighbor [name]  [phone]. The patient is alone today.   Allergies: No Known Allergies  Current Medications: Current Outpatient Medications  Medication Sig Dispense Refill  . allopurinol (ZYLOPRIM)  300 MG tablet Take 1 tablet (300 mg total) by mouth daily. 90 tablet 1  . Cyanocobalamin (VITAMIN B-12 PO) Take 1 tablet by mouth daily.    Marland Kitchen doxycycline (VIBRA-TABS) 100 MG tablet Take 1 tablet (100 mg total) by mouth 2 (two) times daily. 20 tablet 0  . glipiZIDE (GLIPIZIDE XL) 10 MG 24 hr tablet Take 1 tablet (10 mg total) by mouth 2 (two) times daily. 180 tablet 1  . glucose blood (ACCU-CHEK COMPACT PLUS) test strip Check blood sugars once a day if desired; LON 99 months; Dx E11.21 102 each 1  . glucose blood (ACCU-CHEK COMPACT STRIPS) test strip 100 each by Other route as needed for other. Use as instructed, check FSBS once a day; LON 99 months, E11.9 100 each 1  . magnesium oxide (MAG-OX) 400 MG tablet Take 1 tablet (400 mg total) by mouth daily. 90 tablet 1  . metFORMIN (GLUCOPHAGE) 1000 MG tablet Take 1 tablet (1,000 mg total) by mouth 2 (two) times daily with a meal. 180 tablet 1  . nateglinide (STARLIX) 60 MG tablet Take 1 tablet (60 mg total) by mouth 3 (three) times daily with meals. 90 tablet 0  . pantoprazole (PROTONIX) 20 MG tablet Take 1 tablet (20 mg total) by mouth daily. 90 tablet 1  . quinapril (ACCUPRIL) 5 MG tablet Take 1 tablet (5 mg total) by mouth daily. 90 tablet 1  . rosuvastatin (CRESTOR) 10 MG tablet Take 1 tablet (10 mg total) by mouth at bedtime. 90 tablet 1  . saw palmetto 160 MG capsule Take 160 mg 2 (two) times daily by mouth.    . tamsulosin (FLOMAX) 0.4 MG CAPS capsule Take 0.4 mg by mouth daily.    . Vitamin D, Ergocalciferol, (DRISDOL) 50000 units CAPS capsule Take 1 capsule (50,000 Units total) by mouth every 7 (seven) days. 4 capsule 1  . lidocaine-prilocaine (EMLA) cream Apply to affected area once (Patient not taking: Reported on 07/12/2017) 30 g 3  . mupirocin ointment (BACTROBAN) 2 %   0   No current facility-administered medications for this visit.    Facility-Administered Medications Ordered in Other Visits  Medication Dose Route Frequency Provider  Last Rate Last Dose  . heparin lock flush 100 unit/mL  500 Units Intravenous Once Corcoran, Melissa C, MD      . heparin lock flush 100 unit/mL  500 Units Intravenous Once Lequita Asal, MD        Review of Systems  Constitutional: Positive for malaise/fatigue. Negative for diaphoresis, fever and weight loss.       Feels pretty good.  HENT: Negative.  Negative for congestion, nosebleeds, sinus pain and sore throat.   Eyes: Negative.  Negative for blurred vision, double vision, pain, discharge and redness.  Respiratory: Positive for cough (improved) and shortness of breath (exertional; improved). Negative for hemoptysis and sputum production.   Cardiovascular: Negative for chest pain, palpitations, orthopnea, leg swelling and PND.  Gastrointestinal: Negative for abdominal pain, blood  in stool, constipation, diarrhea, melena, nausea and vomiting.  Genitourinary: Negative for dysuria, frequency, hematuria and urgency.  Musculoskeletal: Negative for back pain, falls, joint pain and myalgias.  Skin: Negative for itching and rash.  Neurological: Positive for tingling (intermittent cold neuropathy associated with chemotherapy treatments. Diabetic neuropathy in RIGHT foot. ). Negative for dizziness, tremors, weakness and headaches.  Endo/Heme/Allergies: Does not bruise/bleed easily.       Diabetes; poor glycemic control  Psychiatric/Behavioral: Positive for memory loss (mild memory issues). Negative for depression and suicidal ideas. The patient is nervous/anxious (related to health status). The patient does not have insomnia.   All other systems reviewed and are negative.  Physical Exam: Blood pressure 117/72, pulse 93, temperature 97.9 F (36.6 C), temperature source Tympanic, resp. rate 18, height _0  (1.803 m), weight 271 lb 9.6 oz (123.2 kg), SpO2 98 %. GENERAL:  Well developed, well nourished, heavyset gentleman sitting comfortably in the exam room in no acute distress. MENTAL  STATUS:  Alert and oriented to person, place and time. HEAD:  Thin gray hair.  Normocephalic, atraumatic, face symmetric, no Cushingoid features. EYES:  Glasses.  Blue eyes.  Pupils equal round and reactive to light and accomodation.  No conjunctivitis or scleral icterus. ENT:  Oropharynx clear without lesion.  Tongue normal. Mucous membranes moist.  RESPIRATORY:  Clear to auscultation without rales, wheezes or rhonchi. CARDIOVASCULAR:  Regular rate and rhythm without murmur, rub or gallop. ABDOMEN:  Soft, non-tender, with active bowel sounds, and no appreciable hepatosplenomegaly.  No masses. SKIN:  No rashes, ulcers or lesions. EXTREMITIES: No edema, no skin discoloration or tenderness.  No palpable cords. LYMPH NODES: No palpable cervical, supraclavicular, axillary or inguinal adenopathy  NEUROLOGICAL: Unremarkable. PSYCH:  Appropriate.   Infusion on 08/02/2017  Component Date Value Ref Range Status  . Magnesium 08/02/2017 1.5* 1.7 - 2.4 mg/dL Final   Performed at [facility], [address] [place], [place] [address]  . Sodium 08/02/2017 135  135 - 145 mmol/L Final  . Potassium 08/02/2017 4.2  3.5 - 5.1 mmol/L Final  . Chloride 08/02/2017 104  101 - 111 mmol/L Final  . CO2 08/02/2017 21* 22 - 32 mmol/L Final  . Glucose, Bld 08/02/2017 227* 65 - 99 mg/dL Final  . BUN 08/02/2017 17  6 - 20 mg/dL Final  . Creatinine, Ser 08/02/2017 0.95  0.61 - 1.24 mg/dL Final  . Calcium 08/02/2017 9.6  8.9 - 10.3 mg/dL Final  . Total Protein 08/02/2017 6.9  6.5 - 8.1 g/dL Final  . Albumin 08/02/2017 3.6  3.5 - 5.0 g/dL Final  . AST 08/02/2017 34  15 - 41 U/L Final  . ALT 08/02/2017 27  17 - 63 U/L Final  . Alkaline Phosphatase 08/02/2017 64  38 - 126 U/L Final  . Total Bilirubin 08/02/2017 0.6  0.3 - 1.2 mg/dL Final  . GFR calc non Af Amer 08/02/2017 >60  >60 mL/min Final  . GFR calc Af Amer 08/02/2017 >60  >60 mL/min Final   Comment: (NOTE) The eGFR has been calculated using the CKD  EPI equation. This calculation has not been validated in all clinical situations. eGFR's persistently <60 mL/min signify possible Chronic Kidney Disease.   Georgiann Hahn gap 08/02/2017 10  5 - 15 Final   Performed at [facility], [address] [place], [place] [address]  . WBC 08/02/2017 4.1  3.8 - 10.6 K/uL Final  . RBC 08/02/2017 3.53* 4.40 - 5.90 MIL/uL Final  . Hemoglobin 08/02/2017  12.3* 13.0 - 18.0 g/dL Final  . HCT 08/02/2017 35.3* 40.0 - 52.0 % Final  . MCV 08/02/2017 100.2* 80.0 - 100.0 fL Final  . MCH 08/02/2017 34.9* 26.0 - 34.0 pg Final  . MCHC 08/02/2017 34.8  32.0 - 36.0 g/dL Final  . RDW 08/02/2017 16.5* 11.5 - 14.5 % Final  . Platelets 08/02/2017 131* 150 - 440 K/uL Final  . Neutrophils Relative % 08/02/2017 47  % Final  . Neutro Abs 08/02/2017 1.9  1.4 - 6.5 K/uL Final  . Lymphocytes Relative 08/02/2017 39  % Final  . Lymphs Abs 08/02/2017 1.6  1.0 - 3.6 K/uL Final  . Monocytes Relative 08/02/2017 11  % Final  . Monocytes Absolute 08/02/2017 0.5  0.2 - 1.0 K/uL Final  . Eosinophils Relative 08/02/2017 2  % Final  . Eosinophils Absolute 08/02/2017 0.1  0 - 0.7 K/uL Final  . Basophils Relative 08/02/2017 1  % Final  . Basophils Absolute 08/02/2017 0.0  0 - 0.1 K/uL Final   Performed at [name], [address] [place], [place] [address]    Assessment:  Donald Mcdowell. is a 72 y.o. male with stage IIIB transverse colon cancer s/p right partial colectomy on 12/28/2016.  Pathology revealed a 5.5 cm grade II invasive adenocarcinoma of the transverse colon, extending through the muscularis propria and into the adjacent adipose tissue, including the greater omentum. There was metastatic adenocarcinoma in multiple lymph nodes and tumor deposits, at least 5 involved with a total of at least 18 lymph nodes (5 of 18).  Pathologic stage was T3N2a.  CEA was 1.4 on 11/09/2016.  Abdomen and pelvic CT on 11/09/2016 revealed mid transverse colon segment of luminal  narrowing with wall thickening compatible with colon cancer.  There was a single enlarged adjacent mesenteric lymph node and prominent subcentimeter lymph nodes anterior to the pancreas which may be metastatic.  There was no evidence of distant metastasis.  There was a left kidney lower pole complex cyst with thin internal septations (Bosniak IIF).  His prostate was enlarged and extended into the floor of bladder.  There was right kidney lower pole punctate nonobstructing nephrolithiasis.  Chest CT on 11/18/2016 revealed no metastatic disease.  There was abrupt termination of the left upper lobe beyond which the distal bronchus appeared dilated with mild branching into the lung parenchyma.  Imaging suggested a bronchocele in the medial aspect of the left upper lobe. Differential included an endobronchial polyp, endobronchial tumor, bronchial stricture or aspirated foreign body.   Electromagnetic navigational bronchoscopy (ENB) on 12/16/2016 revealed non-small cell carcinoma, favor squamous cell lung carcinoma.  Tumor was positive for p40 and negative for cdx-2 and TTF-1.  He received SBRT to the left upper lobe of 5000 cGy in 5 fractions from 02/16/2017 - 03/02/2017.  Chest CT on 07/09/2017  revealed no residual LUL pulmonary nodule following SBRT treatment. There were no new pulmonary nodules observed.Small distal paraesophageal nodes stable in appearance. Study negative for thoracic metastatic disease.   PET scan on 01/26/2017 revealed a 2.4 x 0.9 cm hypermetabolic medial left upper lobe pulmonary nodule, corresponding to known primary bronchogenic neoplasm. No findings suspicious for metastatic disease.  EGD on 11/05/2016 revealed gastritis.  Pathology confirmed chronic mild gastritis and features of a healing erosion in the stomach.  There was no H pylori, dysplasia or malignancy.  Echocardiogram  On 12/15/2016 revealed mild to moderate aortic valve stenosis. There was abnormal LV relaxation  consistent with grade I diastolic dysfunction. Estimated  EF was 55-60%.  He is s/p 9 cycles of FOLFOX chemotherapy (03/15/2017 - 07/12/2017).  Cycle #8 was held on 06/21/2017 due to low platelet count of 95,000. Cycle #10 was held on 07/26/2017 due to platelet count of 81,000. He has otherwise tolerated chemotherapy well.  He has iron deficiency anemia.  He is on oral iron.  Ferritin was 6 on 09/04/2016.  Hematocrit was 29.7 and hemoglobin 8.8 on 09/04/2016.  Hematocrit was 28.1, hemoglobin 9.3, and MCV 87.9 on 11/09/2016.   Ferritin has been followed: 28 on 10/13/2016, 15 on 01/14/2017, 21 on 03/29/2016, 20 on 04/12/2017, 24 on 04/26/2017, and 49 on 07/12/2017.   Symptomatically, he is doing well. His cough continues to improve. He denies B symptoms. He complains of diabetic neuropathy in his RIGHT foot.  He has an intermittent cold neuropathy in his fingers. He denies nausea, vomiting, and diarrhea.  Exam is stable.  WBC is 4100 ([place] 1900). Hemoglobin 12.3, hematocrit 35.3, and platelets 131,000.  Magnesium 1.5 despite oral Mg supplement daily. Blood sugar is 227.  Plan: 1. Labs today: CBC with diff, CMP, Mg, ferritin 2. Labs reviewed. Blood counts stable and adequate enough for treatment. Will proceed with cycle #10 FOLFOX 3. Discuss routine post-treatment surveillance schedule post completion of chemotherapy. Patient will be seen in the medical oncology clinic every 3 months for the first year, every 4 months for year 2, every 6 months for years 3-5, and then annually thereafter.  4. Discuss blood sugars. Patient has been seen by PCP ([place]), however refused offered interventions.  5. Discuss low magnesium. Magnesium 1.5 today. Will replace with 2 grams intravenous magnesium today.  6. Discuss symptom management.  Patient has antiemetics and pain medications at home to use on a PRN basis. Patient  advising that the  prescribed interventions are adequate at this point. Continue all medications as  previously prescribed.  7. RTC in 2 days for pump disconnect.  8. RTC on 08/16/2017 for MD assessment,  labs (CBC with diff, CMP, Mg, ferritin), and cycle #11 FOLFOX chemotherapy.   [name], NP  08/02/2017, 10:54 AM  I saw and evaluated the patient, participating in the key portions of the service and reviewing pertinent diagnostic studies and records.  I reviewed the nurse practitioner's note and agree with the findings and the plan.  The assessment and plan were discussed with the patient.  Several questions were asked by the patient and answered.   Nolon Stalls, MD 08/02/2017,10:54 AM

## 2017-08-02 ENCOUNTER — Encounter: Payer: Self-pay | Admitting: Hematology and Oncology

## 2017-08-02 ENCOUNTER — Inpatient Hospital Stay: Payer: Medicare Other

## 2017-08-02 ENCOUNTER — Inpatient Hospital Stay (HOSPITAL_BASED_OUTPATIENT_CLINIC_OR_DEPARTMENT_OTHER): Payer: Medicare Other | Admitting: Hematology and Oncology

## 2017-08-02 VITALS — BP 117/72 | HR 93 | Temp 97.9°F | Resp 18 | Ht 71.0 in | Wt 271.6 lb

## 2017-08-02 DIAGNOSIS — C184 Malignant neoplasm of transverse colon: Secondary | ICD-10-CM | POA: Diagnosis not present

## 2017-08-02 DIAGNOSIS — E1142 Type 2 diabetes mellitus with diabetic polyneuropathy: Secondary | ICD-10-CM | POA: Diagnosis not present

## 2017-08-02 DIAGNOSIS — Z5111 Encounter for antineoplastic chemotherapy: Secondary | ICD-10-CM | POA: Diagnosis not present

## 2017-08-02 DIAGNOSIS — D509 Iron deficiency anemia, unspecified: Secondary | ICD-10-CM | POA: Diagnosis not present

## 2017-08-02 DIAGNOSIS — C3412 Malignant neoplasm of upper lobe, left bronchus or lung: Secondary | ICD-10-CM

## 2017-08-02 DIAGNOSIS — N200 Calculus of kidney: Secondary | ICD-10-CM

## 2017-08-02 LAB — CBC WITH DIFFERENTIAL/PLATELET
Basophils Absolute: 0 10*3/uL (ref 0–0.1)
Basophils Relative: 1 %
Eosinophils Absolute: 0.1 10*3/uL (ref 0–0.7)
Eosinophils Relative: 2 %
HCT: 35.3 % — ABNORMAL LOW (ref 40.0–52.0)
Hemoglobin: 12.3 g/dL — ABNORMAL LOW (ref 13.0–18.0)
Lymphocytes Relative: 39 %
Lymphs Abs: 1.6 10*3/uL (ref 1.0–3.6)
MCH: 34.9 pg — ABNORMAL HIGH (ref 26.0–34.0)
MCHC: 34.8 g/dL (ref 32.0–36.0)
MCV: 100.2 fL — ABNORMAL HIGH (ref 80.0–100.0)
Monocytes Absolute: 0.5 10*3/uL (ref 0.2–1.0)
Monocytes Relative: 11 %
Neutro Abs: 1.9 10*3/uL (ref 1.4–6.5)
Neutrophils Relative %: 47 %
Platelets: 131 10*3/uL — ABNORMAL LOW (ref 150–440)
RBC: 3.53 MIL/uL — ABNORMAL LOW (ref 4.40–5.90)
RDW: 16.5 % — ABNORMAL HIGH (ref 11.5–14.5)
WBC: 4.1 10*3/uL (ref 3.8–10.6)

## 2017-08-02 LAB — COMPREHENSIVE METABOLIC PANEL
ALT: 27 U/L (ref 17–63)
AST: 34 U/L (ref 15–41)
Albumin: 3.6 g/dL (ref 3.5–5.0)
Alkaline Phosphatase: 64 U/L (ref 38–126)
Anion gap: 10 (ref 5–15)
BUN: 17 mg/dL (ref 6–20)
CO2: 21 mmol/L — ABNORMAL LOW (ref 22–32)
Calcium: 9.6 mg/dL (ref 8.9–10.3)
Chloride: 104 mmol/L (ref 101–111)
Creatinine, Ser: 0.95 mg/dL (ref 0.61–1.24)
GFR calc Af Amer: >60 mL/min (ref >60)
GFR calc non Af Amer: >60 mL/min (ref >60)
Glucose, Bld: 227 mg/dL — ABNORMAL HIGH (ref 65–99)
Potassium: 4.2 mmol/L (ref 3.5–5.1)
Sodium: 135 mmol/L (ref 135–145)
Total Bilirubin: 0.6 mg/dL (ref 0.3–1.2)
Total Protein: 6.9 g/dL (ref 6.5–8.1)

## 2017-08-02 LAB — FERRITIN: Ferritin: 50 ng/mL (ref 24–336)

## 2017-08-02 LAB — MAGNESIUM: Magnesium: 1.5 mg/dL — ABNORMAL LOW (ref 1.7–2.4)

## 2017-08-02 MED ORDER — SODIUM CHLORIDE 0.9% FLUSH
10.0000 mL | Freq: Once | INTRAVENOUS | Status: AC
  Administered 2017-08-02: 10 mL via INTRAVENOUS
  Filled 2017-08-02: qty 10

## 2017-08-02 MED ORDER — MAGNESIUM SULFATE 2 GM/50ML IV SOLN
2.0000 g | Freq: Once | INTRAVENOUS | Status: AC
  Administered 2017-08-02: 2 g via INTRAVENOUS
  Filled 2017-08-02: qty 50

## 2017-08-02 MED ORDER — SODIUM CHLORIDE 0.9 % IV SOLN
Freq: Once | INTRAVENOUS | Status: AC
  Administered 2017-08-02: 11:00:00 via INTRAVENOUS
  Filled 2017-08-02: qty 1000

## 2017-08-02 MED ORDER — OXALIPLATIN CHEMO INJECTION 100 MG/20ML
200.0000 mg | Freq: Once | INTRAVENOUS | Status: AC
  Administered 2017-08-02: 200 mg via INTRAVENOUS
  Filled 2017-08-02: qty 40

## 2017-08-02 MED ORDER — SODIUM CHLORIDE 0.9 % IV SOLN: 10.0000 mg | Freq: Once | INTRAVENOUS | Status: DC

## 2017-08-02 MED ORDER — LEUCOVORIN CALCIUM INJECTION 350 MG
1000.0000 mg | Freq: Once | INTRAVENOUS | Status: AC
  Administered 2017-08-02: 1000 mg via INTRAVENOUS
  Filled 2017-08-02: qty 50

## 2017-08-02 MED ORDER — HEPARIN SOD (PORK) LOCK FLUSH 100 UNIT/ML IV SOLN: 500.0000 [IU] | Freq: Once | INTRAVENOUS | Status: DC

## 2017-08-02 MED ORDER — SODIUM CHLORIDE 0.9 % IV SOLN
2400.0000 mg/m2 | INTRAVENOUS | Status: DC
  Administered 2017-08-02: 5950 mg via INTRAVENOUS
  Filled 2017-08-02: qty 119

## 2017-08-02 MED ORDER — PALONOSETRON HCL INJECTION 0.25 MG/5ML
0.2500 mg | Freq: Once | INTRAVENOUS | Status: AC
  Administered 2017-08-02: 0.25 mg via INTRAVENOUS
  Filled 2017-08-02: qty 5

## 2017-08-02 MED ORDER — DEXTROSE 5 % IV SOLN
Freq: Once | INTRAVENOUS | Status: AC
  Administered 2017-08-02: 13:00:00 via INTRAVENOUS
  Filled 2017-08-02: qty 1000

## 2017-08-02 MED ORDER — DEXAMETHASONE SODIUM PHOSPHATE 10 MG/ML IJ SOLN
10.0000 mg | Freq: Once | INTRAMUSCULAR | Status: AC
  Administered 2017-08-02: 10 mg via INTRAVENOUS
  Filled 2017-08-02: qty 1

## 2017-08-02 MED ORDER — FLUOROURACIL CHEMO INJECTION 2.5 GM/50ML
400.0000 mg/m2 | Freq: Once | INTRAVENOUS | Status: AC
  Administered 2017-08-02: 1000 mg via INTRAVENOUS
  Filled 2017-08-02: qty 20

## 2017-08-02 NOTE — Addendum Note (Signed)
Addended by: Parveen Freehling, Satira Anis on: 08/02/2017 05:36 PM   Modules accepted: Orders

## 2017-08-02 NOTE — Progress Notes (Signed)
No new changes noted today 

## 2017-08-02 NOTE — Telephone Encounter (Signed)
I called patient; Dr. Jacqlyn Larsen is leaving, out of state as of June 14th He needs a new urologist, would like Dr. Lenise Arena, NP already put in the referral

## 2017-08-04 ENCOUNTER — Inpatient Hospital Stay: Payer: Medicare Other

## 2017-08-04 VITALS — BP 122/66 | HR 99 | Temp 95.5°F | Resp 18

## 2017-08-04 DIAGNOSIS — Z5111 Encounter for antineoplastic chemotherapy: Secondary | ICD-10-CM | POA: Diagnosis not present

## 2017-08-04 DIAGNOSIS — C184 Malignant neoplasm of transverse colon: Secondary | ICD-10-CM

## 2017-08-04 MED ORDER — HEPARIN SOD (PORK) LOCK FLUSH 100 UNIT/ML IV SOLN
500.0000 [IU] | Freq: Once | INTRAVENOUS | Status: AC | PRN
  Administered 2017-08-04: 500 [IU]

## 2017-08-04 MED ORDER — HEPARIN SOD (PORK) LOCK FLUSH 100 UNIT/ML IV SOLN
INTRAVENOUS | Status: AC
  Filled 2017-08-04: qty 5

## 2017-08-16 ENCOUNTER — Encounter: Payer: Self-pay | Admitting: Urgent Care

## 2017-08-16 ENCOUNTER — Inpatient Hospital Stay: Payer: Medicare Other | Attending: Hematology and Oncology

## 2017-08-16 ENCOUNTER — Inpatient Hospital Stay: Payer: Medicare Other

## 2017-08-16 ENCOUNTER — Inpatient Hospital Stay (HOSPITAL_BASED_OUTPATIENT_CLINIC_OR_DEPARTMENT_OTHER): Payer: Medicare Other | Admitting: Urgent Care

## 2017-08-16 ENCOUNTER — Other Ambulatory Visit: Payer: Self-pay | Admitting: Hematology and Oncology

## 2017-08-16 VITALS — BP 154/71 | HR 87 | Temp 97.0°F | Wt 275.1 lb

## 2017-08-16 DIAGNOSIS — D509 Iron deficiency anemia, unspecified: Secondary | ICD-10-CM

## 2017-08-16 DIAGNOSIS — D701 Agranulocytosis secondary to cancer chemotherapy: Secondary | ICD-10-CM | POA: Insufficient documentation

## 2017-08-16 DIAGNOSIS — C3412 Malignant neoplasm of upper lobe, left bronchus or lung: Secondary | ICD-10-CM

## 2017-08-16 DIAGNOSIS — Z452 Encounter for adjustment and management of vascular access device: Secondary | ICD-10-CM | POA: Insufficient documentation

## 2017-08-16 DIAGNOSIS — Z5189 Encounter for other specified aftercare: Secondary | ICD-10-CM | POA: Insufficient documentation

## 2017-08-16 DIAGNOSIS — C184 Malignant neoplasm of transverse colon: Secondary | ICD-10-CM | POA: Insufficient documentation

## 2017-08-16 DIAGNOSIS — T451X5A Adverse effect of antineoplastic and immunosuppressive drugs, initial encounter: Secondary | ICD-10-CM

## 2017-08-16 DIAGNOSIS — D6959 Other secondary thrombocytopenia: Secondary | ICD-10-CM

## 2017-08-16 DIAGNOSIS — Z5111 Encounter for antineoplastic chemotherapy: Secondary | ICD-10-CM | POA: Insufficient documentation

## 2017-08-16 LAB — CBC WITH DIFFERENTIAL/PLATELET
Basophils Absolute: 0 10*3/uL (ref 0–0.1)
Basophils Relative: 1 %
Eosinophils Absolute: 0.1 10*3/uL (ref 0–0.7)
Eosinophils Relative: 2 %
HCT: 34.8 % — ABNORMAL LOW (ref 40.0–52.0)
Hemoglobin: 12 g/dL — ABNORMAL LOW (ref 13.0–18.0)
Lymphocytes Relative: 37 %
Lymphs Abs: 1.4 10*3/uL (ref 1.0–3.6)
MCH: 35 pg — ABNORMAL HIGH (ref 26.0–34.0)
MCHC: 34.6 g/dL (ref 32.0–36.0)
MCV: 101.2 fL — ABNORMAL HIGH (ref 80.0–100.0)
Monocytes Absolute: 0.3 10*3/uL (ref 0.2–1.0)
Monocytes Relative: 8 %
Neutro Abs: 2 10*3/uL (ref 1.4–6.5)
Neutrophils Relative %: 52 %
Platelets: 94 10*3/uL — ABNORMAL LOW (ref 150–440)
RBC: 3.44 MIL/uL — ABNORMAL LOW (ref 4.40–5.90)
RDW: 16.2 % — ABNORMAL HIGH (ref 11.5–14.5)
WBC: 3.9 10*3/uL (ref 3.8–10.6)

## 2017-08-16 LAB — COMPREHENSIVE METABOLIC PANEL
ALT: 36 U/L (ref 17–63)
AST: 37 U/L (ref 15–41)
Albumin: 3.5 g/dL (ref 3.5–5.0)
Alkaline Phosphatase: 62 U/L (ref 38–126)
Anion gap: 10 (ref 5–15)
BUN: 18 mg/dL (ref 6–20)
CO2: 21 mmol/L — ABNORMAL LOW (ref 22–32)
Calcium: 9.3 mg/dL (ref 8.9–10.3)
Chloride: 104 mmol/L (ref 101–111)
Creatinine, Ser: 0.91 mg/dL (ref 0.61–1.24)
GFR calc Af Amer: >60 mL/min (ref >60)
GFR calc non Af Amer: >60 mL/min (ref >60)
Glucose, Bld: 276 mg/dL — ABNORMAL HIGH (ref 65–99)
Potassium: 4.1 mmol/L (ref 3.5–5.1)
Sodium: 135 mmol/L (ref 135–145)
Total Bilirubin: 0.4 mg/dL (ref 0.3–1.2)
Total Protein: 6.7 g/dL (ref 6.5–8.1)

## 2017-08-16 LAB — FERRITIN: Ferritin: 48 ng/mL (ref 24–336)

## 2017-08-16 LAB — MAGNESIUM: Magnesium: 1.6 mg/dL — ABNORMAL LOW (ref 1.7–2.4)

## 2017-08-16 MED ORDER — SODIUM CHLORIDE 0.9% FLUSH
10.0000 mL | INTRAVENOUS | Status: DC | PRN
  Administered 2017-08-16: 10 mL via INTRAVENOUS
  Filled 2017-08-16: qty 10

## 2017-08-16 MED ORDER — HEPARIN SOD (PORK) LOCK FLUSH 100 UNIT/ML IV SOLN
500.0000 [IU] | Freq: Once | INTRAVENOUS | Status: AC
  Administered 2017-08-16: 500 [IU] via INTRAVENOUS

## 2017-08-16 MED ORDER — HEPARIN SOD (PORK) LOCK FLUSH 100 UNIT/ML IV SOLN
INTRAVENOUS | Status: AC
  Filled 2017-08-16: qty 5

## 2017-08-16 NOTE — Progress Notes (Addendum)
[place] [facility] day:  08/16/2017  Chief Complaint: Donald Mcdowell. is a 72 y.o. male with stage IIIB colon cancer who is seen for 1 week assessment prior to cycle #11 FOLFOX chemotherapy.  HPI:   The patient was last seen in the medical oncology clinic on 08/02/2017.  At that time, patient was doing well.  Cough had improved following completion of the prescribed doxycycline course.  Patient denied fevers and increased shortness of breath.  He noted neuropathy in his RIGHT foot. Exam was stable.  WBC 4100 ([place] 1900). Hemoglobin 12.3, hematocrit 35.3, and platelets 131,000.  Magnesium 1.5 despite oral Mg supplement daily. Blood sugar was 227  During patient's last visit he was asking for a referral to urology.  Patient has known nephrolithiasis and was previously under the care of Dr. Jacqlyn Larsen at [facility].  Of note, Dr. Jacqlyn Larsen is leaving the practice and patient needing to establish care locally.  Referral entered and patient has been scheduled to see Dr. John Giovanni on 09/22/2017.  Symptomatically, patient complains of an upset stomach. He is experiencing nausea, however he has not vomited. Patient states, "I know it is because of that raw fish that I ate at [facility] [employer] last night". He denies diarrhea or fevers. He continues to have neuropathy (stable) in his RIGHT foot and BILATERAL hands, however notes that neither are affecting his day to day function or ability to perform ADL/IADLs.   Patient denies that he has experienced any B symptoms. He denies any interval infections. He continues to struggle with managing his blood sugars. He notes that he finally agreeed to allow Dr. Sanda Klein to start him on nateglinide (Starlix). He notes that he is really not checking his blood sugars at home because he "doesn't give a damn" about his diabetes.   Patient maintains an adequate appetite, and notes that he is eating well. Weight, compared to his last visit to the clinic, has  increased by 4 pounds.   Patient denies pain in the clinic today.  Past Medical History:  Diagnosis Date  . Abdominal aortic atherosclerosis ([place]) 09/15/2016   CT scan June 2018  . Allergy   . Cancer ([place])   . Coronary artery calcification seen on CAT scan 09/15/2016   Previous smoker; noted on CT scan June 2018  . Diabetes mellitus without complication ([place])   . GERD (gastroesophageal reflux disease)   . Gout   . Hyperlipidemia   . Hypertension   . Kidney function abnormal    stage 2  . Kidney stones   . Obesity 08/20/2015    Past Surgical History:  Procedure Laterality Date  . COLONOSCOPY WITH PROPOFOL N/A 11/05/2016   Procedure: COLONOSCOPY WITH PROPOFOL;  Surgeon: Lucilla Lame, MD;  Location: [place];  Service: Gastroenterology;  Laterality: N/A;  . ELECTROMAGNETIC NAVIGATION BROCHOSCOPY N/A 12/16/2016   Procedure: ELECTROMAGNETIC NAVIGATION BRONCHOSCOPY;  Surgeon: Flora Lipps, MD;  Location: [facility];  Service: Cardiopulmonary;  Laterality: N/A;  . ESOPHAGOGASTRODUODENOSCOPY (EGD) WITH PROPOFOL N/A 11/05/2016   Procedure: ESOPHAGOGASTRODUODENOSCOPY (EGD) WITH PROPOFOL;  Surgeon: Lucilla Lame, MD;  Location: [place];  Service: Gastroenterology;  Laterality: N/A;  Diabetic - oral meds  . kidney stones    . PARTIAL COLECTOMY N/A 12/28/2016   Procedure: PARTIAL COLECTOMY RIGHT;  Surgeon: Jules Husbands, MD;  Location: [facility];  Service: General;  Laterality: N/A;  . POLYPECTOMY N/A 11/05/2016   Procedure: POLYPECTOMY;  Surgeon: Lucilla Lame, MD;  Location:  [place];  Service: Gastroenterology;  Laterality: N/A;  . PORTACATH PLACEMENT Right 01/18/2017   Procedure: INSERTION PORT-A-CATH;  Surgeon: Jules Husbands, MD;  Location: [facility];  Service: General;  Laterality: Right;    Family History  Problem Relation Age of Onset  . Alzheimer's disease Mother   . Heart disease Father   . Diabetes Father   . Cancer Father        Prostate  . Cancer  Sister        skin cancer    Social History:  reports that he quit smoking about 14 months ago. His smoking use included cigarettes. He has never used smokeless tobacco. He reports that he does not drink alcohol or use drugs.  Patient worked for a [employer].  He retired in 1996.  He was in his 80s. Patient was a 1-2 pack per day smoker since he was a child.  He stopped smoking in 07/2016.  He drank alcohol in the past.  He has 3 living sisters (1 sister died).  He has no children. Patient married to [place].  Communicate with neighbor [name]  [phone]. The patient is alone today.   Allergies: No Known Allergies  Current Medications: Current Outpatient Medications  Medication Sig Dispense Refill  . allopurinol (ZYLOPRIM) 300 MG tablet Take 1 tablet (300 mg total) by mouth daily. 90 tablet 1  . Cyanocobalamin (VITAMIN B-12 PO) Take 1 tablet by mouth daily.    Marland Kitchen glipiZIDE (GLIPIZIDE XL) 10 MG 24 hr tablet Take 1 tablet (10 mg total) by mouth 2 (two) times daily. 180 tablet 1  . glucose blood (ACCU-CHEK COMPACT PLUS) test strip Check blood sugars once a day if desired; LON 99 months; Dx E11.21 102 each 1  . glucose blood (ACCU-CHEK COMPACT STRIPS) test strip 100 each by Other route as needed for other. Use as instructed, check FSBS once a day; LON 99 months, E11.9 100 each 1  . magnesium oxide (MAG-OX) 400 MG tablet Take 1 tablet (400 mg total) by mouth daily. 90 tablet 1  . metFORMIN (GLUCOPHAGE) 1000 MG tablet Take 1 tablet (1,000 mg total) by mouth 2 (two) times daily with a meal. 180 tablet 1  . mupirocin ointment (BACTROBAN) 2 %   0  . nateglinide (STARLIX) 60 MG tablet Take 1 tablet (60 mg total) by mouth 3 (three) times daily with meals. 90 tablet 0  . pantoprazole (PROTONIX) 20 MG tablet Take 1 tablet (20 mg total) by mouth daily. 90 tablet 1  . quinapril (ACCUPRIL) 5 MG tablet Take 1 tablet (5 mg total) by mouth daily. 90 tablet 1  . rosuvastatin (CRESTOR) 10 MG tablet  Take 1 tablet (10 mg total) by mouth at bedtime. 90 tablet 1  . saw palmetto 160 MG capsule Take 160 mg 2 (two) times daily by mouth.    . tamsulosin (FLOMAX) 0.4 MG CAPS capsule Take 0.4 mg by mouth daily.    . Vitamin D, Ergocalciferol, (DRISDOL) 50000 units CAPS capsule Take 1 capsule (50,000 Units total) by mouth every 7 (seven) days. 4 capsule 1  . lidocaine-prilocaine (EMLA) cream Apply to affected area once (Patient not taking: Reported on 07/12/2017) 30 g 3   No current facility-administered medications for this visit.    Facility-Administered Medications Ordered in Other Visits  Medication Dose Route Frequency Provider Last Rate Last Dose  . heparin lock flush 100 unit/mL  500 Units Intravenous Once Lequita Asal, MD  Review of Systems  Constitutional: Positive for malaise/fatigue. Negative for diaphoresis, fever and weight loss.  HENT: Negative.   Eyes: Negative.   Respiratory: Positive for shortness of breath (exertional). Negative for cough, hemoptysis and sputum production.   Cardiovascular: Negative for chest pain, palpitations, orthopnea, leg swelling and PND.  Gastrointestinal: Positive for nausea. Negative for abdominal pain, blood in stool, constipation, diarrhea, melena and vomiting.  Genitourinary: Negative for dysuria, frequency, hematuria and urgency.  Musculoskeletal: Negative for back pain, falls, joint pain and myalgias.  Skin: Negative for itching and rash.  Neurological: Positive for tingling (intermittent cold neuropathy in hands. Diabetic neuropathy in RIGHT foot.). Negative for dizziness, tremors, weakness and headaches.  Endo/Heme/Allergies: Does not bruise/bleed easily.       Poorly controlled CBGs; recently started on nateglinide  Psychiatric/Behavioral: Positive for memory loss (mild memory issues). Negative for depression and suicidal ideas. The patient is nervous/anxious (associated with current health status). The patient does not have  insomnia.   All other systems reviewed and are negative.  Performance status (ECOG): 1 - Symptomatic but completely ambulatory   BP (!) 154/71 (BP Location: Left Arm, Patient Position: Sitting)   Pulse 87   Temp (!) 97 F (36.1 C) (Tympanic)   Wt 275 lb 2 oz (124.8 kg)   BMI 38.37 kg/m    Physical Exam  Constitutional: He is oriented to person, place, and time. He appears well-developed and well-nourished.  HENT:  Head: Normocephalic and atraumatic.  Thin grey hair  Eyes: Pupils are equal, round, and reactive to light. EOM are normal. No scleral icterus.  Glasses. Blue eyes.  Neck: Normal range of motion. Neck supple. No tracheal deviation present. No thyromegaly present.  Cardiovascular: Normal rate, regular rhythm and normal heart sounds. Exam reveals no gallop and no friction rub.  No murmur heard. Pulmonary/Chest: Effort normal and breath sounds normal. No respiratory distress. He has no wheezes. He has no rales.  Abdominal: Soft. Bowel sounds are normal. He exhibits no distension. There is no tenderness.  Musculoskeletal: Normal range of motion. He exhibits no edema.  Lymphadenopathy:    He has no cervical adenopathy.    He has no axillary adenopathy.       Right: No inguinal and no supraclavicular adenopathy present.       Left: No inguinal and no supraclavicular adenopathy present.  Neurological: He is alert and oriented to person, place, and time.  Neuropathy in hands and RIGHT foot.   Skin: Skin is warm and dry. No rash noted. No erythema.  Psychiatric: He has a normal mood and affect. His behavior is normal. Judgment normal.    Infusion on 08/16/2017  Component Date Value Ref Range Status  . WBC 08/16/2017 3.9  3.8 - 10.6 K/uL Final  . RBC 08/16/2017 3.44* 4.40 - 5.90 MIL/uL Final  . Hemoglobin 08/16/2017 12.0* 13.0 - 18.0 g/dL Final  . HCT 08/16/2017 34.8* 40.0 - 52.0 % Final  . MCV 08/16/2017 101.2* 80.0 - 100.0 fL Final  . MCH 08/16/2017 35.0* 26.0 - 34.0 pg  Final  . MCHC 08/16/2017 34.6  32.0 - 36.0 g/dL Final  . RDW 08/16/2017 16.2* 11.5 - 14.5 % Final  . Platelets 08/16/2017 94* 150 - 440 K/uL Final  . Neutrophils Relative % 08/16/2017 52  % Final  . Neutro Abs 08/16/2017 2.0  1.4 - 6.5 K/uL Final  . Lymphocytes Relative 08/16/2017 37  % Final  . Lymphs Abs 08/16/2017 1.4  1.0 - 3.6 K/uL Final  .  Monocytes Relative 08/16/2017 8  % Final  . Monocytes Absolute 08/16/2017 0.3  0.2 - 1.0 K/uL Final  . Eosinophils Relative 08/16/2017 2  % Final  . Eosinophils Absolute 08/16/2017 0.1  0 - 0.7 K/uL Final  . Basophils Relative 08/16/2017 1  % Final  . Basophils Absolute 08/16/2017 0.0  0 - 0.1 K/uL Final   Performed at [facility], [address] [place], [place] [address]  . Ferritin 08/16/2017 48  24 - 336 ng/mL Final   Performed at [facility], [address] [place], [place] [address]  . Magnesium 08/16/2017 1.6* 1.7 - 2.4 mg/dL Final   Performed at [facility], [address] [place], [place] [address]  . Sodium 08/16/2017 135  135 - 145 mmol/L Final  . Potassium 08/16/2017 4.1  3.5 - 5.1 mmol/L Final  . Chloride 08/16/2017 104  101 - 111 mmol/L Final  . CO2 08/16/2017 21* 22 - 32 mmol/L Final  . Glucose, Bld 08/16/2017 276* 65 - 99 mg/dL Final  . BUN 08/16/2017 18  6 - 20 mg/dL Final  . Creatinine, Ser 08/16/2017 0.91  0.61 - 1.24 mg/dL Final  . Calcium 08/16/2017 9.3  8.9 - 10.3 mg/dL Final  . Total Protein 08/16/2017 6.7  6.5 - 8.1 g/dL Final  . Albumin 08/16/2017 3.5  3.5 - 5.0 g/dL Final  . AST 08/16/2017 37  15 - 41 U/L Final  . ALT 08/16/2017 36  17 - 63 U/L Final  . Alkaline Phosphatase 08/16/2017 62  38 - 126 U/L Final  . Total Bilirubin 08/16/2017 0.4  0.3 - 1.2 mg/dL Final  . GFR calc non Af Amer 08/16/2017 >60  >60 mL/min Final  . GFR calc Af Amer 08/16/2017 >60  >60 mL/min Final   Comment: (NOTE) The eGFR has been calculated using the CKD EPI equation. This calculation has not been validated  in all clinical situations. eGFR's persistently <60 mL/min signify possible Chronic Kidney Disease.   Georgiann Hahn gap 08/16/2017 10  5 - 15 Final   Performed at [facility], [address] [place], [place] [address]    Assessment:  Donald Mcdowell. is a 72 y.o. male with stage IIIB transverse colon cancer s/p right partial colectomy on 12/28/2016.  Pathology revealed a 5.5 cm grade II invasive adenocarcinoma of the transverse colon, extending through the muscularis propria and into the adjacent adipose tissue, including the greater omentum. There was metastatic adenocarcinoma in multiple lymph nodes and tumor deposits, at least 5 involved with a total of at least 18 lymph nodes (5 of 18).  Pathologic stage was T3N2a.  CEA was 1.4 on 11/09/2016.  Abdomen and pelvic CT on 11/09/2016 revealed mid transverse colon segment of luminal narrowing with wall thickening compatible with colon cancer.  There was a single enlarged adjacent mesenteric lymph node and prominent subcentimeter lymph nodes anterior to the pancreas which may be metastatic.  There was no evidence of distant metastasis.  There was a left kidney lower pole complex cyst with thin internal septations (Bosniak IIF).  His prostate was enlarged and extended into the floor of bladder.  There was right kidney lower pole punctate nonobstructing nephrolithiasis.  Chest CT on 11/18/2016 revealed no metastatic disease.  There was abrupt termination of the left upper lobe beyond which the distal bronchus appeared dilated with mild branching into the lung parenchyma.  Imaging suggested a bronchocele in the medial aspect of the left upper lobe. Differential included an  endobronchial polyp, endobronchial tumor, bronchial stricture or aspirated foreign body.   Electromagnetic navigational bronchoscopy (ENB) on 12/16/2016 revealed non-small cell carcinoma, favor squamous cell lung carcinoma.  Tumor was positive for p40 and negative for cdx-2 and  TTF-1.  He received SBRT to the left upper lobe of 5000 cGy in 5 fractions from 02/16/2017 - 03/02/2017.  Chest CT on 07/09/2017  revealed no residual LUL pulmonary nodule following SBRT treatment. There were no new pulmonary nodules observed.Small distal paraesophageal nodes stable in appearance. Study negative for thoracic metastatic disease.   PET scan on 01/26/2017 revealed a 2.4 x 0.9 cm hypermetabolic medial left upper lobe pulmonary nodule, corresponding to known primary bronchogenic neoplasm. No findings suspicious for metastatic disease.  EGD on 11/05/2016 revealed gastritis.  Pathology confirmed chronic mild gastritis and features of a healing erosion in the stomach.  There was no H pylori, dysplasia or malignancy.  Echocardiogram  On 12/15/2016 revealed mild to moderate aortic valve stenosis. There was abnormal LV relaxation consistent with grade I diastolic dysfunction. Estimated EF was 55-60%.  He is s/p 10 cycles of FOLFOX chemotherapy (03/15/2017 - 08/02/2017).  Cycle #8 was held on 06/21/2017 due to low platelet count of 95,000. Cycle #10 was held on 07/26/2017 due to platelet count of 81,000. He has otherwise tolerated chemotherapy well.  He has iron deficiency anemia.  He is on oral iron.  Ferritin was 6 on 09/04/2016.  Hematocrit was 29.7 and hemoglobin 8.8 on 09/04/2016.  Hematocrit was 28.1, hemoglobin 9.3, and MCV 87.9 on 11/09/2016.   Ferritin has been followed: 28 on 10/13/2016, 15 on 01/14/2017, 21 on 03/29/2016, 20 on 04/12/2017, 24 on 04/26/2017, 49 on 07/12/2017, and 50 on 08/02/2017.  Symptomatically, he complains of an upset stomach secondary to a food borne etiology. (+) nausea without vomiting. No diarrhea. He denies B symptoms. He complains of diabetic neuropathy in his RIGHT foot.  He has an intermittent cold neuropathy in his fingers. Exam is stable.  WBC is 3900 ([place] 2000). Hemoglobin 12.0, hematocrit 34.8, and platelets 94,000.  Magnesium 1.6 despite oral Mg  supplement daily. Blood sugar is 276.  Plan: 1. Labs today: CBC with diff, CMP, Mg, ferritin 2. Labs reviewed. Platelets low at 94, 000. ANC adequate at 2000. Will postpone cycle #11 FOLFOX 3. Review post-treatment surveillance schedule post completion of chemotherapy. Patient will be seen in the medical oncology clinic every 3 months for the first year, every 4 months for year 2, every 6 months for years 3-5, and then annually thereafter.  4. Discuss HYPERglycemia. Patient's blood glucose 276 today. Recently started on nateglinide. Follow up with Dr. Sanda Klein for continued monitoring and treatment.  5. Discuss low magnesium. Magnesium 1.6 today. Will increase Mag-Ox to 400 mg BID. If patient develops diarrhea, he was instructed to call the clinic to discuss.   6. Discuss follow up with urology. Patient has a past medical history significant for urolithiasis. Dr. Jacqlyn Larsen is leaving practice. We were able to secure patient an appointment with Dr. John Giovanni on 09/22/2017 7. Discuss symptom management.  Patient has antiemetics and pain medications at home to use on a PRN basis. Patient  advising that the  prescribed interventions are adequate at this point. Continue all medications as previously prescribed.  8. RTC on 08/23/2017 for MD assessment,  labs (CBC with diff, CMP, Mg,), and cycle +/- # 11 FOLFOX chemotherapy if counts have recovered.    [name], NP  08/16/2017, 8:15 AM

## 2017-08-16 NOTE — Progress Notes (Signed)
Patient offers no complaints today. 

## 2017-08-20 DIAGNOSIS — T451X5A Adverse effect of antineoplastic and immunosuppressive drugs, initial encounter: Secondary | ICD-10-CM

## 2017-08-20 DIAGNOSIS — D6959 Other secondary thrombocytopenia: Secondary | ICD-10-CM | POA: Insufficient documentation

## 2017-08-22 NOTE — Progress Notes (Signed)
[facility] day:  08/23/2017  Chief Complaint: Donald Slocumb. is a 71 y.o. male with stage IIIB colon cancer who is seen for assessment prior to cycle #11 FOLFOX chemotherapy.  HPI:   The patient was last seen in the medical oncology clinic on 08/16/2017.  At that time, patient was complaining of an upset stomach (nausea). He denied fevers and increased diarrhea. Stable neuropathy in is RIGHT foot and BILATERAL hands. He was struggling to control blood sugars. He had been recently started on nateglinide (Starlix).  He was not consistently checking blood sugars citing that he "didn't give a damn" about his diabetes.  Exam was stable.  WBC was 3900 ([place] 2000). Platelets low 94,000.  Mg was 1.6 despite oral Mg supplement daily. Blood sugar was 276. Chemotherapy was held due to platelet count.  Oral Mg was increased to BID.   Symptomatically, patient is doing well today, and does not express any acute concerns. Patient denies B symptoms. He has not experienced any significant interval infections. Patient continues to have a cold neuropathy in his hands, and "a little" numbness and tingling in his RIGHT foot. Patient notes that he is eating well. Weight has decreased by 2  pounds.   Patient denies pain in the clinic today.    Past Medical History:  Diagnosis Date  . Abdominal aortic atherosclerosis ([place]) 09/15/2016   CT scan June 2018  . Allergy   . Cancer ([place])   . Coronary artery calcification seen on CAT scan 09/15/2016   Previous smoker; noted on CT scan June 2018  . Diabetes mellitus without complication ([place])   . GERD (gastroesophageal reflux disease)   . Gout   . Hyperlipidemia   . Hypertension   . Kidney function abnormal    stage 2  . Kidney stones   . Obesity 08/20/2015    Past Surgical History:  Procedure Laterality Date  . COLONOSCOPY WITH PROPOFOL N/A 11/05/2016   Procedure: COLONOSCOPY WITH PROPOFOL;  Surgeon: Lucilla Lame, MD;   Location: [facility];  Service: Gastroenterology;  Laterality: N/A;  . ELECTROMAGNETIC NAVIGATION BROCHOSCOPY N/A 12/16/2016   Procedure: ELECTROMAGNETIC NAVIGATION BRONCHOSCOPY;  Surgeon: Flora Lipps, MD;  Location: [facility];  Service: Cardiopulmonary;  Laterality: N/A;  . ESOPHAGOGASTRODUODENOSCOPY (EGD) WITH PROPOFOL N/A 11/05/2016   Procedure: ESOPHAGOGASTRODUODENOSCOPY (EGD) WITH PROPOFOL;  Surgeon: Lucilla Lame, MD;  Location: [facility];  Service: Gastroenterology;  Laterality: N/A;  Diabetic - oral meds  . kidney stones    . PARTIAL COLECTOMY N/A 12/28/2016   Procedure: PARTIAL COLECTOMY RIGHT;  Surgeon: Jules Husbands, MD;  Location: [facility];  Service: General;  Laterality: N/A;  . POLYPECTOMY N/A 11/05/2016   Procedure: POLYPECTOMY;  Surgeon: Lucilla Lame, MD;  Location: [facility];  Service: Gastroenterology;  Laterality: N/A;  . PORTACATH PLACEMENT Right 01/18/2017   Procedure: INSERTION PORT-A-CATH;  Surgeon: Jules Husbands, MD;  Location: [facility];  Service: General;  Laterality: Right;    Family History  Problem Relation Age of Onset  . Alzheimer's disease Mother   . Heart disease Father   . Diabetes Father   . Cancer Father        Prostate  . Cancer Sister        skin cancer    Social History:  reports that he quit smoking about 14 months ago. His smoking use included cigarettes. He has never used smokeless tobacco. He reports that he does not drink alcohol  or use drugs.  Patient worked for a [employer].  He retired in 1996.  He was in his 73s. Patient was a 1-2 pack per day smoker since he was a child.  He stopped smoking in 07/2016.  He drank alcohol in the past.  He has 3 living sisters (1 sister died).  He has no children. Patient married to [place].  Communicate with neighbor [name]  [phone]. The patient is alone today.   Allergies: No Known Allergies  Current Medications: Current Outpatient Medications  Medication Sig  Dispense Refill  . allopurinol (ZYLOPRIM) 300 MG tablet Take 1 tablet (300 mg total) by mouth daily. 90 tablet 1  . Cyanocobalamin (VITAMIN B-12 PO) Take 1 tablet by mouth daily.    Marland Kitchen glipiZIDE (GLIPIZIDE XL) 10 MG 24 hr tablet Take 1 tablet (10 mg total) by mouth 2 (two) times daily. 180 tablet 1  . glucose blood (ACCU-CHEK COMPACT PLUS) test strip Check blood sugars once a day if desired; LON 99 months; Dx E11.21 102 each 1  . magnesium oxide (MAG-OX) 400 MG tablet Take 1 tablet (400 mg total) by mouth daily. 90 tablet 1  . metFORMIN (GLUCOPHAGE) 1000 MG tablet Take 1 tablet (1,000 mg total) by mouth 2 (two) times daily with a meal. 180 tablet 1  . nateglinide (STARLIX) 60 MG tablet Take 1 tablet (60 mg total) by mouth 3 (three) times daily with meals. 90 tablet 0  . pantoprazole (PROTONIX) 20 MG tablet Take 1 tablet (20 mg total) by mouth daily. 90 tablet 1  . quinapril (ACCUPRIL) 5 MG tablet Take 1 tablet (5 mg total) by mouth daily. 90 tablet 1  . rosuvastatin (CRESTOR) 10 MG tablet Take 1 tablet (10 mg total) by mouth at bedtime. 90 tablet 1  . saw palmetto 160 MG capsule Take 160 mg 2 (two) times daily by mouth.    . tamsulosin (FLOMAX) 0.4 MG CAPS capsule Take 0.4 mg by mouth daily.    Marland Kitchen lidocaine-prilocaine (EMLA) cream Apply to affected area once (Patient not taking: Reported on 07/12/2017) 30 g 3  . Vitamin D, Ergocalciferol, (DRISDOL) 50000 units CAPS capsule Take 1 capsule (50,000 Units total) by mouth every 7 (seven) days. (Patient not taking: Reported on 08/23/2017) 4 capsule 1   No current facility-administered medications for this visit.    Facility-Administered Medications Ordered in Other Visits  Medication Dose Route Frequency Provider Last Rate Last Dose  . heparin lock flush 100 unit/mL  500 Units Intravenous Once Corcoran, Melissa C, MD      . heparin lock flush 100 unit/mL  500 Units Intravenous Once Lequita Asal, MD        Review of Systems  Constitutional:  Positive for malaise/fatigue and weight loss (down 2 pounds). Negative for diaphoresis and fever.  HENT: Negative.  Negative for congestion, ear pain, nosebleeds, sinus pain and sore throat.   Eyes: Negative.  Negative for blurred vision, double vision, pain, discharge and redness.  Respiratory: Positive for shortness of breath (exertional). Negative for cough, hemoptysis and sputum production.   Cardiovascular: Negative for chest pain, palpitations, orthopnea, leg swelling and PND.  Gastrointestinal: Negative for abdominal pain, blood in stool, constipation, diarrhea, melena, nausea and vomiting.  Genitourinary: Negative for dysuria, frequency, hematuria and urgency.  Musculoskeletal: Negative for back pain, falls, joint pain and myalgias.  Skin: Negative for itching and rash.  Neurological: Positive for tingling (intermittent cold neuropathy in BILATERAL hands. Diabetic neuropathy in RIGHT foot. ). Negative  for dizziness, tremors, weakness and headaches.  Endo/Heme/Allergies: Does not bruise/bleed easily.       Diabetes - poorly controlled. Patient not checking blood sugars on a consistent basis. Recently started on nateglinide.  Psychiatric/Behavioral: Positive for memory loss (mild memory issues). Negative for depression and suicidal ideas. The patient is nervous/anxious (related to current health state). The patient does not have insomnia.   All other systems reviewed and are negative.  Performance status (ECOG): 1 - Symptomatic but completely ambulatory   BP 129/82 (BP Location: Left Arm, Patient Position: Sitting)   Pulse 87   Temp (!) 97 F (36.1 C) (Tympanic)   Resp 18   Ht _0  (1.803 m)   Wt 273 lb 14.4 oz (124.2 kg)   BMI 38.20 kg/m    Physical Exam  Constitutional: He is oriented to person, place, and time. He appears well-developed and well-nourished.  HENT:  Head: Normocephalic and atraumatic.  Wearing a black cap.  Thin gray hair  Eyes: Pupils are equal, round, and  reactive to light. EOM are normal. No scleral icterus.  Glasses. Blue eyes.  Neck: Normal range of motion. Neck supple. No tracheal deviation present. No thyromegaly present.  Cardiovascular: Normal rate, regular rhythm and normal heart sounds. Exam reveals no gallop and no friction rub.  No murmur heard. Pulmonary/Chest: Effort normal and breath sounds normal. No respiratory distress. He has no wheezes. He has no rales.  Abdominal: Soft. Bowel sounds are normal. He exhibits no distension. There is no tenderness.  Musculoskeletal: Normal range of motion. He exhibits no edema.  Lymphadenopathy:    He has no cervical adenopathy.    He has no axillary adenopathy.       Right: No inguinal and no supraclavicular adenopathy present.       Left: No inguinal and no supraclavicular adenopathy present.  Neurological: He is alert and oriented to person, place, and time.  Neuropathy in hands and RIGHT foot.   Skin: Skin is warm and dry. No rash noted. No erythema.  Psychiatric: He has a normal mood and affect. His behavior is normal. Judgment normal.    Infusion on 08/23/2017  Component Date Value Ref Range Status  . Magnesium 08/23/2017 1.8  1.7 - 2.4 mg/dL Final   Performed at [facility], [address] [place], [place] [address]  . Sodium 08/23/2017 138  135 - 145 mmol/L Final  . Potassium 08/23/2017 4.6  3.5 - 5.1 mmol/L Final  . Chloride 08/23/2017 103  101 - 111 mmol/L Final  . CO2 08/23/2017 25  22 - 32 mmol/L Final  . Glucose, Bld 08/23/2017 324* 65 - 99 mg/dL Final  . BUN 08/23/2017 14  6 - 20 mg/dL Final  . Creatinine, Ser 08/23/2017 1.02  0.61 - 1.24 mg/dL Final  . Calcium 08/23/2017 9.5  8.9 - 10.3 mg/dL Final  . Total Protein 08/23/2017 6.8  6.5 - 8.1 g/dL Final  . Albumin 08/23/2017 3.6  3.5 - 5.0 g/dL Final  . AST 08/23/2017 46* 15 - 41 U/L Final  . ALT 08/23/2017 46  17 - 63 U/L Final  . Alkaline Phosphatase 08/23/2017 69  38 - 126 U/L Final  . Total Bilirubin  08/23/2017 0.5  0.3 - 1.2 mg/dL Final  . GFR calc non Af Amer 08/23/2017 >60  >60 mL/min Final  . GFR calc Af Amer 08/23/2017 >60  >60 mL/min Final   Comment: (NOTE) The eGFR has been calculated using the CKD EPI equation. This calculation  has not been validated in all clinical situations. eGFR's persistently <60 mL/min signify possible Chronic Kidney Disease.   Georgiann Hahn gap 08/23/2017 10  5 - 15 Final   Performed at [facility], [address] [place], [place] [address]  . WBC 08/23/2017 3.4* 3.8 - 10.6 K/uL Final  . RBC 08/23/2017 3.65* 4.40 - 5.90 MIL/uL Final  . Hemoglobin 08/23/2017 12.9* 13.0 - 18.0 g/dL Final  . HCT 08/23/2017 37.0* 40.0 - 52.0 % Final  . MCV 08/23/2017 101.1* 80.0 - 100.0 fL Final  . MCH 08/23/2017 35.2* 26.0 - 34.0 pg Final  . MCHC 08/23/2017 34.8  32.0 - 36.0 g/dL Final  . RDW 08/23/2017 15.9* 11.5 - 14.5 % Final  . Platelets 08/23/2017 138* 150 - 440 K/uL Final  . Neutrophils Relative % 08/23/2017 40  % Final  . Neutro Abs 08/23/2017 1.3* 1.4 - 6.5 K/uL Final  . Lymphocytes Relative 08/23/2017 44  % Final  . Lymphs Abs 08/23/2017 1.5  1.0 - 3.6 K/uL Final  . Monocytes Relative 08/23/2017 12  % Final  . Monocytes Absolute 08/23/2017 0.4  0.2 - 1.0 K/uL Final  . Eosinophils Relative 08/23/2017 3  % Final  . Eosinophils Absolute 08/23/2017 0.1  0 - 0.7 K/uL Final  . Basophils Relative 08/23/2017 1  % Final  . Basophils Absolute 08/23/2017 0.0  0 - 0.1 K/uL Final   Performed at [facility], [address] [place], [place] [address]    Assessment:  Donald Beckner. is a 72 y.o. male with stage IIIB transverse colon cancer s/p right partial colectomy on 12/28/2016.  Pathology revealed a 5.5 cm grade II invasive adenocarcinoma of the transverse colon, extending through the muscularis propria and into the adjacent adipose tissue, including the greater omentum. There was metastatic adenocarcinoma in multiple lymph nodes and tumor deposits, at  least 5 involved with a total of at least 18 lymph nodes (5 of 18).  Pathologic stage was T3N2a.  CEA was 1.4 on 11/09/2016.  Abdomen and pelvic CT on 11/09/2016 revealed mid transverse colon segment of luminal narrowing with wall thickening compatible with colon cancer.  There was a single enlarged adjacent mesenteric lymph node and prominent subcentimeter lymph nodes anterior to the pancreas which may be metastatic.  There was no evidence of distant metastasis.  There was a left kidney lower pole complex cyst with thin internal septations (Bosniak IIF).  His prostate was enlarged and extended into the floor of bladder.  There was right kidney lower pole punctate nonobstructing nephrolithiasis.  Chest CT on 11/18/2016 revealed no metastatic disease.  There was abrupt termination of the left upper lobe beyond which the distal bronchus appeared dilated with mild branching into the lung parenchyma.  Imaging suggested a bronchocele in the medial aspect of the left upper lobe. Differential included an endobronchial polyp, endobronchial tumor, bronchial stricture or aspirated foreign body.   Electromagnetic navigational bronchoscopy (ENB) on 12/16/2016 revealed non-small cell carcinoma, favor squamous cell lung carcinoma.  Tumor was positive for p40 and negative for cdx-2 and TTF-1.  He received SBRT to the left upper lobe of 5000 cGy in 5 fractions from 02/16/2017 - 03/02/2017.  Chest CT on 07/09/2017  revealed no residual LUL pulmonary nodule following SBRT treatment. There were no new pulmonary nodules observed.Small distal paraesophageal nodes stable in appearance. Study negative for thoracic metastatic disease.   PET scan on 01/26/2017 revealed a 2.4 x 0.9 cm hypermetabolic medial left upper lobe pulmonary nodule, corresponding  to known primary bronchogenic neoplasm. No findings suspicious for metastatic disease.  EGD on 11/05/2016 revealed gastritis.  Pathology confirmed chronic mild gastritis and  features of a healing erosion in the stomach.  There was no H pylori, dysplasia or malignancy.  Echocardiogram  On 12/15/2016 revealed mild to moderate aortic valve stenosis. There was abnormal LV relaxation consistent with grade I diastolic dysfunction. Estimated EF was 55-60%.  He is s/p 10 cycles of FOLFOX chemotherapy (03/15/2017 - 08/02/2017).  Cycle #8 was held on 06/21/2017 due to low platelet count of 95,000. Cycle #10 was held on 07/26/2017 due to platelet count of 81,000. Cycle #11 was help on 08/16/2017 due to platelet count of 94,000. He has otherwise tolerated chemotherapy well.  He has iron deficiency anemia.  He is on oral iron.  Ferritin was 6 on 09/04/2016.  Hematocrit was 29.7 and hemoglobin 8.8 on 09/04/2016.  Hematocrit was 28.1, hemoglobin 9.3, and MCV 87.9 on 11/09/2016.   Ferritin has been followed: 28 on 10/13/2016, 15 on 01/14/2017, 21 on 03/29/2016, 20 on 04/12/2017, 24 on 04/26/2017, 49 on 07/12/2017,  50 on 08/02/2017, and 48 on 08/16/2017.   Symptomatically, he is doing well.  He denies nausea, vomiting, and diarrhea. He complains of diabetic neuropathy in his RIGHT foot.  He has an intermittent cold neuropathy in his fingers.  Exam is stable.  WBC is 3400 (ANC 1300). Platelets 138,000.  Magnesium is 1.8  Blood sugar is 324.  Plan: 1. Labs today: CBC with diff, CMP, Mg,  2. Labs reviewed. Blood counts stable and adequate enough for treatment. Will proceed with cycle # 11 FOLFOX chemotherapy today as planned.  3. Review post-treatment surveillance schedule following completion of chemotherapy. We review this frequently with the patient as he tends to forget, which causes him to be anxious about future plans. Patient will be seen in the medical oncology clinic every 3 months for the first year, every 4 months for year 2, every 6 months for years 3-5, and then annually thereafter.  4. Discuss HYPERglycemia. Patient's blood glucose is 324 today. Recently started on  nateglinide. Follow up with Dr. Sanda Klein for continued monitoring and treatment.  5. Discuss symptom management.  Patient has antiemetics and pain medications at home to use on a PRN basis. Patient  advising that the  prescribed interventions are adequate at this point. Continue all medications as previously prescribed.  6. Discuss chemotherapy induced neutropenia. Will require Neulasta in order to received treatment.  Discuss use of Claritin to prevent Neulasta induced pain. 7. Discuss magnesium level of 1.8 today. Continue Mag-Ox 400 mg BID. If patient develops diarrhea, he was instructed to call the clinic to discuss.   8. Discuss follow up with urology. Patient has a past medical history significant for urolithiasis.  Dr. Jacqlyn Larsen is leaving practice. We were able to secure patient an appointment with Dr. John Giovanni on 09/22/2017 9. RTC in 2 days for pump disconnect and Neulasta.  10. RTC on 09/06/2017 for MD assessment,  labs (CBC with diff, CMP, Mg), and +/- cycle # 12 FOLFOX chemotherapy.    [name], NP  08/23/2017, 9:16 AM   I saw and evaluated the patient, participating in the key portions of the service and reviewing pertinent diagnostic studies and records.  I reviewed the nurse practitioner's note and agree with the findings and the plan.  The assessment and plan were discussed with the patient.  Several questions were asked by the patient and answered.   Nolon Stalls, MD  08/23/2017,9:16 AM

## 2017-08-23 ENCOUNTER — Inpatient Hospital Stay: Payer: Medicare Other

## 2017-08-23 ENCOUNTER — Other Ambulatory Visit: Payer: Self-pay

## 2017-08-23 ENCOUNTER — Inpatient Hospital Stay (HOSPITAL_BASED_OUTPATIENT_CLINIC_OR_DEPARTMENT_OTHER): Payer: Medicare Other | Admitting: Hematology and Oncology

## 2017-08-23 ENCOUNTER — Encounter: Payer: Self-pay | Admitting: Hematology and Oncology

## 2017-08-23 VITALS — BP 129/82 | HR 87 | Temp 97.0°F | Resp 18 | Ht 71.0 in | Wt 273.9 lb

## 2017-08-23 DIAGNOSIS — D701 Agranulocytosis secondary to cancer chemotherapy: Secondary | ICD-10-CM | POA: Diagnosis not present

## 2017-08-23 DIAGNOSIS — C184 Malignant neoplasm of transverse colon: Secondary | ICD-10-CM

## 2017-08-23 DIAGNOSIS — D509 Iron deficiency anemia, unspecified: Secondary | ICD-10-CM

## 2017-08-23 DIAGNOSIS — Z5111 Encounter for antineoplastic chemotherapy: Secondary | ICD-10-CM | POA: Diagnosis not present

## 2017-08-23 DIAGNOSIS — C182 Malignant neoplasm of ascending colon: Secondary | ICD-10-CM

## 2017-08-23 DIAGNOSIS — C3412 Malignant neoplasm of upper lobe, left bronchus or lung: Secondary | ICD-10-CM

## 2017-08-23 LAB — COMPREHENSIVE METABOLIC PANEL
ALT: 46 U/L (ref 17–63)
AST: 46 U/L — ABNORMAL HIGH (ref 15–41)
Albumin: 3.6 g/dL (ref 3.5–5.0)
Alkaline Phosphatase: 69 U/L (ref 38–126)
Anion gap: 10 (ref 5–15)
BUN: 14 mg/dL (ref 6–20)
CO2: 25 mmol/L (ref 22–32)
Calcium: 9.5 mg/dL (ref 8.9–10.3)
Chloride: 103 mmol/L (ref 101–111)
Creatinine, Ser: 1.02 mg/dL (ref 0.61–1.24)
GFR calc Af Amer: >60 mL/min (ref >60)
GFR calc non Af Amer: >60 mL/min (ref >60)
Glucose, Bld: 324 mg/dL — ABNORMAL HIGH (ref 65–99)
Potassium: 4.6 mmol/L (ref 3.5–5.1)
Sodium: 138 mmol/L (ref 135–145)
Total Bilirubin: 0.5 mg/dL (ref 0.3–1.2)
Total Protein: 6.8 g/dL (ref 6.5–8.1)

## 2017-08-23 LAB — CBC WITH DIFFERENTIAL/PLATELET
Basophils Absolute: 0 10*3/uL (ref 0–0.1)
Basophils Relative: 1 %
Eosinophils Absolute: 0.1 10*3/uL (ref 0–0.7)
Eosinophils Relative: 3 %
HCT: 37 % — ABNORMAL LOW (ref 40.0–52.0)
Hemoglobin: 12.9 g/dL — ABNORMAL LOW (ref 13.0–18.0)
Lymphocytes Relative: 44 %
Lymphs Abs: 1.5 10*3/uL (ref 1.0–3.6)
MCH: 35.2 pg — ABNORMAL HIGH (ref 26.0–34.0)
MCHC: 34.8 g/dL (ref 32.0–36.0)
MCV: 101.1 fL — ABNORMAL HIGH (ref 80.0–100.0)
Monocytes Absolute: 0.4 10*3/uL (ref 0.2–1.0)
Monocytes Relative: 12 %
Neutro Abs: 1.3 10*3/uL — ABNORMAL LOW (ref 1.4–6.5)
Neutrophils Relative %: 40 %
Platelets: 138 10*3/uL — ABNORMAL LOW (ref 150–440)
RBC: 3.65 MIL/uL — ABNORMAL LOW (ref 4.40–5.90)
RDW: 15.9 % — ABNORMAL HIGH (ref 11.5–14.5)
WBC: 3.4 10*3/uL — ABNORMAL LOW (ref 3.8–10.6)

## 2017-08-23 LAB — FERRITIN: Ferritin: 40 ng/mL (ref 24–336)

## 2017-08-23 LAB — MAGNESIUM: Magnesium: 1.8 mg/dL (ref 1.7–2.4)

## 2017-08-23 MED ORDER — OXALIPLATIN CHEMO INJECTION 100 MG/20ML
200.0000 mg | Freq: Once | INTRAVENOUS | Status: AC
  Administered 2017-08-23: 200 mg via INTRAVENOUS
  Filled 2017-08-23: qty 40

## 2017-08-23 MED ORDER — LEUCOVORIN CALCIUM INJECTION 350 MG
1000.0000 mg | Freq: Once | INTRAVENOUS | Status: AC
  Administered 2017-08-23: 1000 mg via INTRAVENOUS
  Filled 2017-08-23: qty 50

## 2017-08-23 MED ORDER — FLUOROURACIL CHEMO INJECTION 2.5 GM/50ML
400.0000 mg/m2 | Freq: Once | INTRAVENOUS | Status: AC
  Administered 2017-08-23: 1000 mg via INTRAVENOUS
  Filled 2017-08-23: qty 20

## 2017-08-23 MED ORDER — SODIUM CHLORIDE 0.9 % IV SOLN: 10.0000 mg | Freq: Once | INTRAVENOUS | Status: DC

## 2017-08-23 MED ORDER — PALONOSETRON HCL INJECTION 0.25 MG/5ML
0.2500 mg | Freq: Once | INTRAVENOUS | Status: AC
  Administered 2017-08-23: 0.25 mg via INTRAVENOUS
  Filled 2017-08-23: qty 5

## 2017-08-23 MED ORDER — FLUOROURACIL CHEMO INJECTION 5 GM/100ML
2400.0000 mg/m2 | INTRAVENOUS | Status: DC
  Administered 2017-08-23: 5950 mg via INTRAVENOUS
  Filled 2017-08-23: qty 100

## 2017-08-23 MED ORDER — HEPARIN SOD (PORK) LOCK FLUSH 100 UNIT/ML IV SOLN: 500.0000 [IU] | Freq: Once | INTRAVENOUS | Status: DC

## 2017-08-23 MED ORDER — SODIUM CHLORIDE 0.9% FLUSH
10.0000 mL | Freq: Once | INTRAVENOUS | Status: AC
  Administered 2017-08-23: 10 mL via INTRAVENOUS
  Filled 2017-08-23: qty 10

## 2017-08-23 MED ORDER — DEXAMETHASONE SODIUM PHOSPHATE 10 MG/ML IJ SOLN
10.0000 mg | Freq: Once | INTRAMUSCULAR | Status: AC
  Administered 2017-08-23: 10 mg via INTRAVENOUS
  Filled 2017-08-23: qty 1

## 2017-08-23 MED ORDER — DEXTROSE 5 % IV SOLN
Freq: Once | INTRAVENOUS | Status: AC
  Administered 2017-08-23: 11:00:00 via INTRAVENOUS
  Filled 2017-08-23: qty 1000

## 2017-08-23 NOTE — Progress Notes (Signed)
ANC 1.3.  Ok to treat per MD, patient to get [place]

## 2017-08-24 ENCOUNTER — Ambulatory Visit: Payer: Medicare Other | Admitting: Family Medicine

## 2017-08-24 ENCOUNTER — Encounter: Payer: Self-pay | Admitting: Family Medicine

## 2017-08-24 VITALS — BP 136/64 | HR 95 | Temp 98.0°F | Resp 16 | Ht 71.0 in | Wt 274.4 lb

## 2017-08-24 DIAGNOSIS — C3412 Malignant neoplasm of upper lobe, left bronchus or lung: Secondary | ICD-10-CM | POA: Diagnosis not present

## 2017-08-24 DIAGNOSIS — C778 Secondary and unspecified malignant neoplasm of lymph nodes of multiple regions: Secondary | ICD-10-CM | POA: Diagnosis not present

## 2017-08-24 DIAGNOSIS — E1165 Type 2 diabetes mellitus with hyperglycemia: Secondary | ICD-10-CM

## 2017-08-24 DIAGNOSIS — I10 Essential (primary) hypertension: Secondary | ICD-10-CM | POA: Diagnosis not present

## 2017-08-24 DIAGNOSIS — C184 Malignant neoplasm of transverse colon: Secondary | ICD-10-CM | POA: Diagnosis not present

## 2017-08-24 DIAGNOSIS — D61818 Other pancytopenia: Secondary | ICD-10-CM

## 2017-08-24 DIAGNOSIS — E1121 Type 2 diabetes mellitus with diabetic nephropathy: Secondary | ICD-10-CM

## 2017-08-24 DIAGNOSIS — IMO0002 Reserved for concepts with insufficient information to code with codable children: Secondary | ICD-10-CM

## 2017-08-24 MED ORDER — INSULIN NPH (HUMAN) (ISOPHANE) 100 UNIT/ML ~~LOC~~ SUSP: SUBCUTANEOUS | 0 refills | Status: DC

## 2017-08-24 MED ORDER — INSULIN PEN NEEDLE 31G X 6 MM MISC: 0 refills | Status: AC

## 2017-08-24 NOTE — Assessment & Plan Note (Signed)
Start insulin; also has morbid obesity, neuropathy; continue other meds; expect numbers to improve with cessation of the chemo and prednisone; foot exam by MD today

## 2017-08-24 NOTE — Assessment & Plan Note (Signed)
Controlled today 

## 2017-08-24 NOTE — Assessment & Plan Note (Signed)
Managed by oncologist

## 2017-08-24 NOTE — Patient Instructions (Addendum)
Check out the information at familydoctor.org entitled "Nutrition for Weight Loss: What You Need to Know about Fad Diets" Try to lose between 1-2 pounds per week by taking in fewer calories and burning off more calories You can succeed by limiting portions, limiting foods dense in calories and fat, becoming more active, and drinking 8 glasses of water a day (64 ounces) Don't skip meals, especially breakfast, as skipping meals may alter your metabolism Do not use over-the-counter weight loss pills or gimmicks that claim rapid weight loss A healthy BMI (or body mass index) is between 18.5 and 24.9 You can calculate your ideal BMI at the [place] website [employer]Mon[employer]fr  Start insulin: five units a day in the morning for 3 days Then eight units a day in the morning for 3 days Then if sugars are not under 150, increase to ten units a day Call me if sugars are not under 150 in two weeks  Isophane Insulin (NPH) injection What is this medicine? ISOPHANE INSULIN (NPH) (EYE soe fane IN su lin) is a human-made form of insulin. This medicine lowers the amount of sugar in the blood. It is an intermediate-acting insulin that starts working about 1.5 hours after it is injected. This medicine may be used for other purposes; ask your health care provider or pharmacist if you have questions. COMMON BRAND NAME(S): Humulin N, Novolin N, ReliOn What should I tell my health care provider before I take this medicine? They need to know if you have any of these conditions: -episodes of hypoglycemia -kidney disease -liver disease -an unusual or allergic reaction to insulin, metacresol, other medicines, foods, dyes, or preservatives -pregnant or trying to get pregnant -breast-feeding How should I use this medicine? Insulin is for injection under the skin. Use exactly as directed. It is important to follow the directions given to you by your health care professional or  doctor. You will be taught how to use this medicine and how to adjust doses for activities and illness. Do not use more insulin than prescribed. Do not use more or less often than prescribed. Always check the appearance of your insulin before using it. This medicine should be white and cloudy. Do not use it if it is not uniformly cloudy after mixing. It is important that you put your used needles and syringes in a special sharps container. Do not put them in a trash can. If you do not have a sharps container, call your pharmacist or healthcare provider to get one. Talk to your pediatrician regarding the use of this medicine in children. While this drug may be prescribed for children for selected conditions, precautions do apply. Overdosage: If you think you have taken too much of this medicine contact a poison control center or emergency room at once. NOTE: This medicine is only for you. Do not share this medicine with others. What if I miss a dose? It is important not to miss a dose. Your health care professional or doctor should discuss a plan for missed doses with you. If you do miss a dose, follow their plan. Do not take double doses. What may interact with this medicine? -other medicines for diabetes Many medications may cause an increase or decrease in blood sugar, these include: -alcohol containing beverages -aspirin and aspirin-like drugs -chloramphenicol -chromium -diuretics -male hormones, like estrogens or progestins and birth control pills -heart medicines -isoniazid -male hormones or anabolic steroids -medicines for weight loss -medicines for allergies, asthma, cold, or cough -medicines for mental problems -medicines called  MAO Inhibitors like Nardil, Parnate, Marplan, Eldepryl -niacin -NSAIDs, medicines for pain and inflammation, like ibuprofen or naproxen -pentamidine -phenytoin -probenecid -quinolone antibiotics like ciprofloxacin, levofloxacin, ofloxacin -some herbal  dietary supplements -steroid medicines like prednisone or cortisone -thyroid medicine Some medications can hide the warning symptoms of low blood sugar. You may need to monitor your blood sugar more closely if you are taking one of these medications. These include: -beta-blockers such as atenolol, metoprolol, propranolol -clonidine -guanethidine -reserpine This list may not describe all possible interactions. Give your health care provider a list of all the medicines, herbs, non-prescription drugs, or dietary supplements you use. Also tell them if you smoke, drink alcohol, or use illegal drugs. Some items may interact with your medicine. What should I watch for while using this medicine? Visit your health care professional or doctor for regular checks on your progress. A test called the HbA1C (A1C) will be monitored. This is a simple blood test. It measures your blood sugar control over the last 2 to 3 months. You will receive this test every 3 to 6 months. Learn how to check your blood sugar. Learn the symptoms of low and high blood sugar and how to manage them. Always carry a quick-source of sugar with you in case you have symptoms of low blood sugar. Examples include hard sugar candy or glucose tablets. Make sure others know that you can choke if you eat or drink when you develop serious symptoms of low blood sugar, such as seizures or unconsciousness. They must get medical help at once. Tell your doctor or health care professional if you have high blood sugar. You might need to change the dose of your medicine. If you are sick or exercising more than usual, you might need to change the dose of your medicine. Do not skip meals. Ask your doctor or health care professional if you should avoid alcohol. Many nonprescription cough and cold products contain sugar or alcohol. These can affect blood sugar. Make sure that you have the right kind of syringe for the type of insulin you use. Try not to change  the brand and type of insulin or syringe unless your health care professional or doctor tells you to. Switching insulin brand or type can cause dangerously high or low blood sugar. Always keep an extra supply of insulin, syringes, and needles on hand. Use a syringe one time only. Throw away syringe and needle in a closed container to prevent accidental needle sticks. Insulin pens and cartridges should never be shared. Even if the needle is changed, sharing may result in passing of viruses like hepatitis or HIV. Wear a medical ID bracelet or chain, and carry a card that describes your disease and details of your medicine and dosage times. What side effects may I notice from receiving this medicine? Side effects that you should report to your doctor or health care professional as soon as possible: -allergic reactions like skin rash, itching or hives, swelling of the face, lips, or tongue -breathing problems -signs and symptoms of high blood sugar such as dizziness, dry mouth, dry skin, fruity breath, nausea, stomach pain, increased hunger or thirst, increased urination -signs and symptoms of low blood sugar such as feeling anxious, confusion, dizziness, increased hunger, unusually weak or tired, sweating, shakiness, cold, irritable, headache, blurred vision, fast heartbeat, loss of consciousness Side effects that usually do not require medical attention (report to your doctor or health care professional if they continue or are bothersome): -increase or decrease in fatty  tissue under the skin due to overuse of a particular injection site -itching, burning, swelling, or rash at site where injected This list may not describe all possible side effects. Call your doctor for medical advice about side effects. You may report side effects to FDA at 1-800-FDA-1088. Where should I keep my medicine? Keep out of the reach of children. Store unopened insulin vials in a refrigerator between 2 and 8 degrees C (36 and  46 degrees F). Do not freeze or use if the insulin has been frozen. Store opened insulin vials in the refrigerator or at room temperature below 30 degrees C (86 degrees F). Keeping your insulin at room temperature decreases the amount of pain during injection. If you are using Humulin N brand vials, your open insulin vial should be thrown away after 31 days. If you are using Novolin N brand vials, your open insulin vial should be thrown away after 42 days. Protect from heat and light. Store unopened prefilled insulin pens in a refrigerator between 2 and 8 degrees C (36 and 46 degrees F.) Do not freeze or use if the insulin has been frozen. After opening, keep this medicine at room temperature below 30 degrees C (86 degrees F). Do not store in the refrigerator. Protect from heat and light. Throw away the insulin pen 14 days after opening. NOTE: This sheet is a summary. It may not cover all possible information. If you have questions about this medicine, talk to your doctor, pharmacist, or health care provider.  2018 Elsevier/Gold Standard (2015-04-04 10:17:48)

## 2017-08-24 NOTE — Progress Notes (Signed)
BP 136/64   Pulse 95   Temp 98 F (36.7 C) (Oral)   Resp 16   Ht 5\' 11"  (1.803 m)   Wt 274 lb 6.4 oz (124.5 kg)   SpO2 95%   BMI 38.27 kg/m    Subjective:    Patient ID: Donald Pou., male    DOB: Sep 14, 1945, 72 y.o.   MRN: [id number]  HPI: Prayan Ulin. is a 72 y.o. male  Chief Complaint  Patient presents with  . Follow-up    HPI  Patient is here for f/u  He has cancer and is being followed by oncologist; getting chemo; has port; no fevers; his chemo and prednisone are throwing off his sugars; going every other week; last treatment June 24th; no weight loss, no night sweats; appetite is good  He has type 2 diabetes; FSBS 250 this morning; was up over 400 recently after hamburger and watermelon and chemo and steroids; the steroids really run the sugars up; the starlix is not really making any difference; no abdominal symptoms from that Lab Results  Component Value Date   HGBA1C 9.8 (H) 07/21/2017   Morbid obesity; peak weight was 440 pounds so he has done well to keep his weight off, long time ago  HTN; controlled with 5 mg of quinapril; no medicine this morning; usually better  High cholesterol; on the crestor  Hx of low magnesium, but back up yesterday with 800 mg of magnesium daily OTC  Kidney stones; goes to see Dr. Lafe Garin [facility]) soon; July 10th; not drinking enough water  Depression screen [facility] 2/9 08/24/2017 07/21/2017 03/31/2017 03/22/2017 03/01/2017  Decreased Interest 0 0 0 0 2  Down, Depressed, Hopeless 0 0 0 0 3  PHQ - 2 Score 0 0 0 0 5  Altered sleeping - - - - 3  Tired, decreased energy - - - - 3  Change in appetite - - - - 0  Feeling bad or failure about yourself  - - - - 3  Trouble concentrating - - - - 1  Moving slowly or fidgety/restless - - - - 0  Suicidal thoughts - - - - 3  PHQ-9 Score - - - - 18  Difficult doing work/chores - - - - Very difficult    Relevant past medical, surgical, family and social history reviewed Past  Medical History:  Diagnosis Date  . Abdominal aortic atherosclerosis ([place]) 09/15/2016   CT scan June 2018  . Allergy   . Cancer ([place])   . Coronary artery calcification seen on CAT scan 09/15/2016   Previous smoker; noted on CT scan June 2018  . Diabetes mellitus without complication ([place])   . GERD (gastroesophageal reflux disease)   . Gout   . Hyperlipidemia   . Hypertension   . Kidney function abnormal    stage 2  . Kidney stones   . Obesity 08/20/2015   Past Surgical History:  Procedure Laterality Date  . COLONOSCOPY WITH PROPOFOL N/A 11/05/2016   Procedure: COLONOSCOPY WITH PROPOFOL;  Surgeon: Lucilla Lame, MD;  Location: [place];  Service: Gastroenterology;  Laterality: N/A;  . ELECTROMAGNETIC NAVIGATION BROCHOSCOPY N/A 12/16/2016   Procedure: ELECTROMAGNETIC NAVIGATION BRONCHOSCOPY;  Surgeon: Flora Lipps, MD;  Location: [facility];  Service: Cardiopulmonary;  Laterality: N/A;  . ESOPHAGOGASTRODUODENOSCOPY (EGD) WITH PROPOFOL N/A 11/05/2016   Procedure: ESOPHAGOGASTRODUODENOSCOPY (EGD) WITH PROPOFOL;  Surgeon: Lucilla Lame, MD;  Location: [place];  Service: Gastroenterology;  Laterality: N/A;  Diabetic - oral meds  .  kidney stones    . PARTIAL COLECTOMY N/A 12/28/2016   Procedure: PARTIAL COLECTOMY RIGHT;  Surgeon: Jules Husbands, MD;  Location: [facility];  Service: General;  Laterality: N/A;  . POLYPECTOMY N/A 11/05/2016   Procedure: POLYPECTOMY;  Surgeon: Lucilla Lame, MD;  Location: [place];  Service: Gastroenterology;  Laterality: N/A;  . PORTACATH PLACEMENT Right 01/18/2017   Procedure: INSERTION PORT-A-CATH;  Surgeon: Jules Husbands, MD;  Location: [facility];  Service: General;  Laterality: Right;   Family History  Problem Relation Age of Onset  . Alzheimer's disease Mother   . Heart disease Father   . Diabetes Father   . Cancer Father        Prostate  . Cancer Sister        skin cancer   Social History   Tobacco Use  . Smoking status:  Former Smoker    Types: Cigarettes    Last attempt to quit: 06/2016    Years since quitting: 1.1  . Smokeless tobacco: Never Used  Substance Use Topics  . Alcohol use: No    Alcohol/week: 0.0 oz  . Drug use: No    Interim medical history since last visit reviewed. Allergies and medications reviewed  Review of Systems Per HPI unless specifically indicated above     Objective:    BP 136/64   Pulse 95   Temp 98 F (36.7 C) (Oral)   Resp 16   Ht 5\' 11"  (1.803 m)   Wt 274 lb 6.4 oz (124.5 kg)   SpO2 95%   BMI 38.27 kg/m   Wt Readings from Last 3 Encounters:  08/24/17 274 lb 6.4 oz (124.5 kg)  08/23/17 273 lb 14.4 oz (124.2 kg)  08/16/17 275 lb 2 oz (124.8 kg)    Physical Exam  Constitutional: He appears well-developed and well-nourished. No distress.  HENT:  Head: Normocephalic and atraumatic.  Eyes: EOM are normal. No scleral icterus.  Neck: No thyromegaly present.  Cardiovascular: Normal rate and regular rhythm.  Pulmonary/Chest: Effort normal and breath sounds normal.  Abdominal: Soft. Bowel sounds are normal. He exhibits no distension.  Musculoskeletal: He exhibits no edema.  Neurological: Coordination normal.  Skin: Skin is warm and dry. No pallor.  Psychiatric: He has a normal mood and affect. His behavior is normal. Judgment and thought content normal.   Diabetic Foot Form - Detailed   Diabetic Foot Exam - detailed Diabetic Foot exam was performed with the following findings:  Yes 08/24/2017 10:06 AM  Visual Foot Exam completed.:  Yes  Pulse Foot Exam completed.:  Yes  Right Dorsalis Pedis:  Present Left Dorsalis Pedis:  Present  Sensory Foot Exam Completed.:  Yes Semmes-Weinstein Monofilament Test R Site 1-Great Toe:  Pos L Site 1-Great Toe:  Pos    Comments:  monifilament testing reveals reduced sensation in both feet distally      Results for orders placed or performed in visit on 08/23/17  Ferritin  Result Value Ref Range   Ferritin 40 24 - 336  ng/mL  Magnesium  Result Value Ref Range   Magnesium 1.8 1.7 - 2.4 mg/dL  Comprehensive metabolic panel  Result Value Ref Range   Sodium 138 135 - 145 mmol/L   Potassium 4.6 3.5 - 5.1 mmol/L   Chloride 103 101 - 111 mmol/L   CO2 25 22 - 32 mmol/L   Glucose, Bld 324 (H) 65 - 99 mg/dL   BUN 14 6 - 20 mg/dL   Creatinine, Ser 1.02  0.61 - 1.24 mg/dL   Calcium 9.5 8.9 - 10.3 mg/dL   Total Protein 6.8 6.5 - 8.1 g/dL   Albumin 3.6 3.5 - 5.0 g/dL   AST 46 (H) 15 - 41 U/L   ALT 46 17 - 63 U/L   Alkaline Phosphatase 69 38 - 126 U/L   Total Bilirubin 0.5 0.3 - 1.2 mg/dL   GFR calc non Af Amer >60 >60 mL/min   GFR calc Af Amer >60 >60 mL/min   Anion gap 10 5 - 15  CBC with Differential  Result Value Ref Range   WBC 3.4 (L) 3.8 - 10.6 K/uL   RBC 3.65 (L) 4.40 - 5.90 MIL/uL   Hemoglobin 12.9 (L) 13.0 - 18.0 g/dL   HCT 37.0 (L) 40.0 - 52.0 %   MCV 101.1 (H) 80.0 - 100.0 fL   MCH 35.2 (H) 26.0 - 34.0 pg   MCHC 34.8 32.0 - 36.0 g/dL   RDW 15.9 (H) 11.5 - 14.5 %   Platelets 138 (L) 150 - 440 K/uL   Neutrophils Relative % 40 %   Neutro Abs 1.3 (L) 1.4 - 6.5 K/uL   Lymphocytes Relative 44 %   Lymphs Abs 1.5 1.0 - 3.6 K/uL   Monocytes Relative 12 %   Monocytes Absolute 0.4 0.2 - 1.0 K/uL   Eosinophils Relative 3 %   Eosinophils Absolute 0.1 0 - 0.7 K/uL   Basophils Relative 1 %   Basophils Absolute 0.0 0 - 0.1 K/uL      Assessment & Plan:   Problem List Items Addressed This Visit      Cardiovascular and Mediastinum   Essential hypertension, benign    Controlled today        Respiratory   Malignant neoplasm of upper lobe of left lung (HCC)    Managed by oncologist        Digestive   Malignant neoplasm of transverse colon [facility])    Managed by oncologist        Endocrine   Uncontrolled type 2 diabetes mellitus with diabetic nephropathy ([place]) - Primary    Start insulin; also has morbid obesity, neuropathy; continue other meds; expect numbers to improve with cessation of the  chemo and prednisone; foot exam by MD today      Relevant Medications   insulin NPH Human (NOVOLIN N RELION) 100 UNIT/ML injection     Immune and Lymphatic   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 Sanda Klein (or just AFTER).  An after-visit summary was printed and given to the patient at [facility].  Please see the patient instructions which may contain other information and recommendations beyond what is mentioned above in the assessment and plan.  Meds ordered this encounter  Medications  . insulin NPH Human (NOVOLIN N RELION) 100 UNIT/ML injection    Sig: Inject 5 units subcutaneous in the morning; after 3 days, increase to 8 units every  morning; if sugars not less than 150, go up to 10 units    Dispense:  10 mL    Refill:  0  . Insulin Pen Needle 31G X 6 MM MISC    Sig: Use to inject insulin subcutaneously once a day in the morning    Dispense:  100 each    Refill:  0    No orders of the defined types were placed in this encounter.

## 2017-08-24 NOTE — Assessment & Plan Note (Signed)
BMI >35 with comorbidities (DM, HTN, high cholesterol); encouraged weight loss, see AVS

## 2017-08-25 ENCOUNTER — Other Ambulatory Visit: Payer: Self-pay | Admitting: Family Medicine

## 2017-08-25 ENCOUNTER — Inpatient Hospital Stay: Payer: Medicare Other

## 2017-08-25 VITALS — BP 113/66 | HR 90 | Temp 97.0°F

## 2017-08-25 DIAGNOSIS — C184 Malignant neoplasm of transverse colon: Secondary | ICD-10-CM

## 2017-08-25 DIAGNOSIS — Z5111 Encounter for antineoplastic chemotherapy: Secondary | ICD-10-CM | POA: Diagnosis not present

## 2017-08-25 MED ORDER — PEGFILGRASTIM-CBQV 6 MG/0.6ML ~~LOC~~ SOSY
6.0000 mg | PREFILLED_SYRINGE | Freq: Once | SUBCUTANEOUS | Status: AC
  Administered 2017-08-25: 6 mg via SUBCUTANEOUS
  Filled 2017-08-25: qty 0.6

## 2017-08-25 MED ORDER — HEPARIN SOD (PORK) LOCK FLUSH 100 UNIT/ML IV SOLN
500.0000 [IU] | Freq: Once | INTRAVENOUS | Status: AC | PRN
  Administered 2017-08-25: 500 [IU]
  Filled 2017-08-25: qty 5

## 2017-08-25 MED ORDER — SODIUM CHLORIDE 0.9% FLUSH
10.0000 mL | INTRAVENOUS | Status: DC | PRN
  Administered 2017-08-25: 10 mL
  Filled 2017-08-25: qty 10

## 2017-08-26 MED ORDER — NATEGLINIDE 60 MG PO TABS: 60.0000 mg | ORAL_TABLET | Freq: Three times a day (TID) | ORAL | 2 refills | Status: DC

## 2017-08-26 NOTE — Addendum Note (Signed)
Addended by: Cathrine Muster C on: 08/26/2017 11:02 AM   Modules accepted: Orders

## 2017-08-26 NOTE — Addendum Note (Signed)
Addended by: Terre Hanneman, Satira Anis on: 08/26/2017 12:42 PM   Modules accepted: Orders

## 2017-08-26 NOTE — Telephone Encounter (Signed)
nateglinide (STARLIX) 60 MG tablet  Pt takes 3 tabs a day, and only 90 tabs sent in at last refill. So pt is due for the refill.  [facility] [address], [place] [phone] (Phone) [phone] (Fax)

## 2017-09-05 NOTE — Progress Notes (Signed)
[facility] day:  09/06/2017  Chief Complaint: Donald Mcdowell. is a 72 y.o. male with stage IIIB colon cancer who is seen for assessment prior to cycle #12 FOLFOX chemotherapy.  HPI:   The patient was last seen in the medical oncology clinic on 08/23/2017.  At that time, patient was doing well. He continued to struggle with managing his blood sugars despite recently added nateglinide. Blood sugar 324 in clinic. Stable neuropathy in hands and RIGHT foot persisted. No B symptoms or recent infections. Exam was stable.  WBC 3400 ([place] 1300). Platelets 138,000.  Magnesium is 1.8 on oral Mg supplement BID. He received cycle #11 FOLFOX.   He was seen in follow up consult on 08/24/2017 by Dr. Lynnae Sandhoff for routine management of his diabetes. Patient's HYPERglycemia felt to be multifactorial and related to his obesity, poor food choices, and to the pre-chemotherapy corticosteroids he has been receiving. Pre-prandial CBGs have been in excess of 400. Nateglinide had no demonstrated benefits in improving patient's glycemic control. Patient was started on NPH insulin (Novolin) 5 units in the morning x 3 days, at which time her was to increase to 8 units. If CBGs running > 150 after the 8 units, patient was to increase morning dose to 10 units. Patient scheduled to follow up with Dr. Sanda Klein in 8 weeks.   In the interim, he has felt "ok".  He denies any abdominal symptoms.  His right foot sometimes bothers him.  He denies any numbness or tingling.  His right hand often feels cold.  Left knee hurts "some".  He quit walking secondary to shortness of breath.  He does feel tired but "I got here".   Past Medical History:  Diagnosis Date  . Abdominal aortic atherosclerosis ([place]) 09/15/2016   CT scan June 2018  . Allergy   . Cancer ([place])   . Coronary artery calcification seen on CAT scan 09/15/2016   Previous smoker; noted on CT scan June 2018  . Diabetes mellitus without  complication ([name])   . GERD (gastroesophageal reflux disease)   . Gout   . Hyperlipidemia   . Hypertension   . Kidney function abnormal    stage 2  . Kidney stones   . Obesity 08/20/2015    Past Surgical History:  Procedure Laterality Date  . COLONOSCOPY WITH PROPOFOL N/A 11/05/2016   Procedure: COLONOSCOPY WITH PROPOFOL;  Surgeon: Lucilla Lame, MD;  Location: [facility];  Service: Gastroenterology;  Laterality: N/A;  . ELECTROMAGNETIC NAVIGATION BROCHOSCOPY N/A 12/16/2016   Procedure: ELECTROMAGNETIC NAVIGATION BRONCHOSCOPY;  Surgeon: Flora Lipps, MD;  Location: [facility];  Service: Cardiopulmonary;  Laterality: N/A;  . ESOPHAGOGASTRODUODENOSCOPY (EGD) WITH PROPOFOL N/A 11/05/2016   Procedure: ESOPHAGOGASTRODUODENOSCOPY (EGD) WITH PROPOFOL;  Surgeon: Lucilla Lame, MD;  Location: [facility];  Service: Gastroenterology;  Laterality: N/A;  Diabetic - oral meds  . kidney stones    . PARTIAL COLECTOMY N/A 12/28/2016   Procedure: PARTIAL COLECTOMY RIGHT;  Surgeon: Jules Husbands, MD;  Location: [facility];  Service: General;  Laterality: N/A;  . POLYPECTOMY N/A 11/05/2016   Procedure: POLYPECTOMY;  Surgeon: Lucilla Lame, MD;  Location: [facility];  Service: Gastroenterology;  Laterality: N/A;  . PORTACATH PLACEMENT Right 01/18/2017   Procedure: INSERTION PORT-A-CATH;  Surgeon: Jules Husbands, MD;  Location: [facility];  Service: General;  Laterality: Right;    Family History  Problem Relation Age of Onset  . Alzheimer's disease Mother   .  Heart disease Father   . Diabetes Father   . Cancer Father        Prostate  . Cancer Sister        skin cancer    Social History:  reports that he quit smoking about 14 months ago. His smoking use included cigarettes. He has never used smokeless tobacco. He reports that he does not drink alcohol or use drugs.  Patient worked for a [employer].  He retired in 1996.  He was in his 54s. Patient was a 1-2 pack per day smoker  since he was a child.  He stopped smoking in 07/2016.  He drank alcohol in the past.  He has 3 living sisters (1 sister died).  He has no children. Patient married to [place].  Communicate with neighbor [name]  [phone]. The patient is accompanied by his son, Marcello Moores (age 40).   Allergies: No Known Allergies  Current Medications: Current Outpatient Medications  Medication Sig Dispense Refill  . allopurinol (ZYLOPRIM) 300 MG tablet Take 1 tablet (300 mg total) by mouth daily. 90 tablet 1  . Cyanocobalamin (VITAMIN B-12 PO) Take 1 tablet by mouth daily.    Marland Kitchen glipiZIDE (GLIPIZIDE XL) 10 MG 24 hr tablet Take 1 tablet (10 mg total) by mouth 2 (two) times daily. 180 tablet 1  . glucose blood (ACCU-CHEK COMPACT PLUS) test strip Check blood sugars once a day if desired; LON 99 months; Dx E11.21 102 each 1  . insulin NPH Human (NOVOLIN N RELION) 100 UNIT/ML injection Inject 5 units subcutaneous in the morning; after 3 days, increase to 8 units every morning; if sugars not less than 150, go up to 10 units 10 mL 0  . Insulin Pen Needle 31G X 6 MM MISC Use to inject insulin subcutaneously once a day in the morning 100 each 0  . lidocaine-prilocaine (EMLA) cream Apply to affected area once 30 g 3  . loratadine (CLARITIN) 5 MG chewable tablet Chew 5 mg by mouth daily.    . magnesium oxide (MAG-OX) 400 MG tablet Take 1 tablet (400 mg total) by mouth daily. 90 tablet 1  . metFORMIN (GLUCOPHAGE) 1000 MG tablet Take 1 tablet (1,000 mg total) by mouth 2 (two) times daily with a meal. 180 tablet 1  . nateglinide (STARLIX) 60 MG tablet Take 1 tablet (60 mg total) by mouth 3 (three) times daily with meals. 90 tablet 2  . pantoprazole (PROTONIX) 20 MG tablet Take 1 tablet (20 mg total) by mouth daily. 90 tablet 1  . quinapril (ACCUPRIL) 5 MG tablet Take 1 tablet (5 mg total) by mouth daily. 90 tablet 1  . rosuvastatin (CRESTOR) 10 MG tablet Take 1 tablet (10 mg total) by mouth at bedtime. 90 tablet 1  . saw  palmetto 160 MG capsule Take 160 mg 2 (two) times daily by mouth.    . tamsulosin (FLOMAX) 0.4 MG CAPS capsule Take 0.4 mg by mouth daily.     No current facility-administered medications for this visit.    Facility-Administered Medications Ordered in Other Visits  Medication Dose Route Frequency Provider Last Rate Last Dose  . heparin lock flush 100 unit/mL  500 Units Intravenous Once Corcoran, Melissa C, MD      . heparin lock flush 100 unit/mL  500 Units Intravenous Once Corcoran, Melissa C, MD      . sodium chloride flush (NS) 0.9 % injection 10 mL  10 mL Intravenous PRN Lequita Asal, MD   10  mL at 09/06/17 0913    Review of Systems  Constitutional: Positive for malaise/fatigue. Negative for diaphoresis and fever. Weight loss: down 3 pounds.  HENT: Negative.  Negative for congestion, ear discharge, ear pain, sinus pain and sore throat.   Eyes: Negative.  Negative for blurred vision, pain and discharge.  Respiratory: Positive for shortness of breath (exertional). Negative for cough, hemoptysis and sputum production.   Cardiovascular: Negative for chest pain, palpitations, orthopnea, leg swelling and PND.  Gastrointestinal: Negative for abdominal pain, blood in stool, constipation, diarrhea, melena, nausea and vomiting.  Genitourinary: Negative for dysuria, frequency, hematuria and urgency.  Musculoskeletal: Positive for joint pain (left knee). Negative for back pain, falls and myalgias.  Skin: Negative for itching and rash.  Neurological: Positive for tingling (intermittent COLD neuropathy in BILATERAL hands. DIABETIC neuropathy in RIGHT foot). Negative for dizziness, tremors, weakness and headaches.  Endo/Heme/Allergies: Does not bruise/bleed easily.       Diabetes - poorly controlled. Not checking blood sugars on a consistent basis. Recently started on nateglinide and NPH insulin.  Psychiatric/Behavioral: Positive for memory loss (mild memory issues). Negative for depression and  suicidal ideas. The patient is nervous/anxious (related to current health state). The patient does not have insomnia.   All other systems reviewed and are negative.  Performance status (ECOG): 1 - Symptomatic but completely ambulatory   Vital signs: BP (!) 152/74 (BP Location: Left Arm, Patient Position: Sitting)   Pulse (!) 105   Temp 97.7 F (36.5 C) (Tympanic)   Resp 18   Wt 271 lb 12.8 oz (123.3 kg)   BMI 37.91 kg/m   GENERAL:  Well developed, well nourished, heavyset gentleman sitting comfortably in the exam room in no acute distress. MENTAL STATUS:  Alert and oriented to person, place and time. HEAD:  Wearing a black cap.  Kermitt Harjo hair.  Normocephalic, atraumatic, face symmetric, no Cushingoid features. EYES:  Glasses.  Blue eyes.  Pupils equal round and reactive to light and accomodation.  No conjunctivitis or scleral icterus. ENT:  Oropharynx clear without lesion.  Tongue normal. Mucous membranes moist.  RESPIRATORY:  Clear to auscultation without rales, wheezes or rhonchi. CARDIOVASCULAR:  Regular rate and rhythm without murmur, rub or gallop. ABDOMEN:  Soft, non-tender, with active bowel sounds, and no appreciable hepatosplenomegaly.  No masses. SKIN:  No rashes, ulcers or lesions. EXTREMITIES: No edema, no skin discoloration or tenderness.  No palpable cords. Pulses intact. LYMPH NODES: No palpable cervical, supraclavicular, axillary or inguinal adenopathy  NEUROLOGICAL: Unremarkable. PSYCH:  Appropriate.    Infusion on 09/06/2017  Component Date Value Ref Range Status  . Magnesium 09/06/2017 2.0  1.7 - 2.4 mg/dL Final   Performed at [facility], [address], [place], [place] [address]  . Sodium 09/06/2017 136  135 - 145 mmol/L Final  . Potassium 09/06/2017 4.5  3.5 - 5.1 mmol/L Final  . Chloride 09/06/2017 104  101 - 111 mmol/L Final  . CO2 09/06/2017 23  22 - 32 mmol/L Final  . Glucose, Bld 09/06/2017 303* 65 - 99 mg/dL Final  . BUN 09/06/2017 20  6 - 20  mg/dL Final  . Creatinine, Ser 09/06/2017 1.01  0.61 - 1.24 mg/dL Final  . Calcium 09/06/2017 9.4  8.9 - 10.3 mg/dL Final  . Total Protein 09/06/2017 7.0  6.5 - 8.1 g/dL Final  . Albumin 09/06/2017 3.8  3.5 - 5.0 g/dL Final  . AST 09/06/2017 49* 15 - 41 U/L Final  . ALT 09/06/2017 55  17 -  63 U/L Final  . Alkaline Phosphatase 09/06/2017 107  38 - 126 U/L Final  . Total Bilirubin 09/06/2017 0.6  0.3 - 1.2 mg/dL Final  . GFR calc non Af Amer 09/06/2017 >60  >60 mL/min Final  . GFR calc Af Amer 09/06/2017 >60  >60 mL/min Final   Comment: (NOTE) The eGFR has been calculated using the CKD EPI equation. This calculation has not been validated in all clinical situations. eGFR's persistently <60 mL/min signify possible Chronic Kidney Disease.   Georgiann Hahn gap 09/06/2017 9  5 - 15 Final   Performed at [facility], [address] [place], [place] [address]  . WBC 09/06/2017 8.0  3.8 - 10.6 K/uL Final  . RBC 09/06/2017 3.58* 4.40 - 5.90 MIL/uL Final  . Hemoglobin 09/06/2017 12.3* 13.0 - 18.0 g/dL Final  . HCT 09/06/2017 36.4* 40.0 - 52.0 % Final  . MCV 09/06/2017 101.8* 80.0 - 100.0 fL Final  . MCH 09/06/2017 34.4* 26.0 - 34.0 pg Final  . MCHC 09/06/2017 33.8  32.0 - 36.0 g/dL Final  . RDW 09/06/2017 16.1* 11.5 - 14.5 % Final  . Platelets 09/06/2017 93* 150 - 440 K/uL Final  . Neutrophils Relative % 09/06/2017 71  % Final  . Neutro Abs 09/06/2017 5.7  1.4 - 6.5 K/uL Final  . Lymphocytes Relative 09/06/2017 21  % Final  . Lymphs Abs 09/06/2017 1.7  1.0 - 3.6 K/uL Final  . Monocytes Relative 09/06/2017 6  % Final  . Monocytes Absolute 09/06/2017 0.5  0.2 - 1.0 K/uL Final  . Eosinophils Relative 09/06/2017 1  % Final  . Eosinophils Absolute 09/06/2017 0.1  0 - 0.7 K/uL Final  . Basophils Relative 09/06/2017 1  % Final  . Basophils Absolute 09/06/2017 0.0  0 - 0.1 K/uL Final   Performed at [facility], [address] [place], [place] [address]    Assessment:  Trask Vosler. is a 72 y.o. male with stage IIIB transverse colon cancer s/p right partial colectomy on 12/28/2016.  Pathology revealed a 5.5 cm grade II invasive adenocarcinoma of the transverse colon, extending through the muscularis propria and into the adjacent adipose tissue, including the greater omentum. There was metastatic adenocarcinoma in multiple lymph nodes and tumor deposits, at least 5 involved with a total of at least 18 lymph nodes (5 of 18).  Pathologic stage was T3N2a.  CEA was 1.4 on 11/09/2016.  Abdomen and pelvic CT on 11/09/2016 revealed mid transverse colon segment of luminal narrowing with wall thickening compatible with colon cancer.  There was a single enlarged adjacent mesenteric lymph node and prominent subcentimeter lymph nodes anterior to the pancreas which may be metastatic.  There was no evidence of distant metastasis.  There was a left kidney lower pole complex cyst with thin internal septations (Bosniak IIF).  His prostate was enlarged and extended into the floor of bladder.  There was right kidney lower pole punctate nonobstructing nephrolithiasis.  Chest CT on 11/18/2016 revealed no metastatic disease.  There was abrupt termination of the left upper lobe beyond which the distal bronchus appeared dilated with mild branching into the lung parenchyma.  Imaging suggested a bronchocele in the medial aspect of the left upper lobe. Differential included an endobronchial polyp, endobronchial tumor, bronchial stricture or aspirated foreign body.   Electromagnetic navigational bronchoscopy (ENB) on 12/16/2016 revealed non-small cell carcinoma, favor squamous cell lung carcinoma.  Tumor was positive for p40 and negative for cdx-2 and TTF-1.  He received  SBRT to the left upper lobe of 5000 cGy in 5 fractions from 02/16/2017 - 03/02/2017.  Chest CT on 07/09/2017  revealed no residual LUL pulmonary nodule following SBRT treatment. There were no new pulmonary nodules observed.Small distal  paraesophageal nodes stable in appearance. Study negative for thoracic metastatic disease.   PET scan on 01/26/2017 revealed a 2.4 x 0.9 cm hypermetabolic medial left upper lobe pulmonary nodule, corresponding to known primary bronchogenic neoplasm. No findings suspicious for metastatic disease.  EGD on 11/05/2016 revealed gastritis.  Pathology confirmed chronic mild gastritis and features of a healing erosion in the stomach.  There was no H pylori, dysplasia or malignancy.  Echocardiogram  On 12/15/2016 revealed mild to moderate aortic valve stenosis. There was abnormal LV relaxation consistent with grade I diastolic dysfunction. Estimated EF was 55-60%.  He is s/p 11 cycles of FOLFOX chemotherapy (03/15/2017 - 08/23/2017).  Cycle #8 was held on 06/21/2017 due to low platelet count of 95,000. Cycle #10 was held on 07/26/2017 due to platelet count of 81,000. Cycle #11 was help on 08/16/2017 due to platelet count of 94,000. He has otherwise tolerated chemotherapy well.  He received Neulasta with cycle #11.  He has iron deficiency anemia.  He is on oral iron.  Ferritin was 6 on 09/04/2016.  Hematocrit was 29.7 and hemoglobin 8.8 on 09/04/2016.  Hematocrit was 28.1, hemoglobin 9.3, and MCV 87.9 on 11/09/2016.   Ferritin has been followed: 28 on 10/13/2016, 15 on 01/14/2017, 21 on 03/29/2016, 20 on 04/12/2017, 24 on 04/26/2017, 49 on 07/12/2017,  50 on 08/02/2017, and 48 on 08/16/2017.   Symptomatically, he notes chronic fatigue.  He does not have any worsening neuropathy.  Exam is stable.  WBC is 8000 ([place] 5700). Platelets 93,000.  Magnesium is 2.0.  Blood sugar is 303.  Plan: 1. Labs today: CBC with diff, CMP, Mg 2. Labs reviewed. Platelet count low. Will hold cycle # 12 FOLFOX chemotherapy. 3. Anticipate interval CT imaging of the chest, abdomen, and pelvis on 10/11/2017. 4. Review post-treatment surveillance schedule following completion of chemotherapy. We review this frequently with the patient  as he tends to forget, which causes him to be anxious about future plans. Patient will be seen in the medical oncology clinic every 3 months for the first year, every 4 months for year 2, every 6 months for years 3-5, and then annually thereafter.  5. Discuss HYPERglycemia. Patient's blood glucose is 303 today. Started on nateglinide, which had no effect on his glucose readings. Started NPH insulin on 08/24/2017. Follow up with Dr. Sanda Klein for continued monitoring and treatment.  6. Discuss symptom management.  Patient has antiemetics and pain medications at home to use on a PRN basis. Patient  advising that the  prescribed interventions are adequate at this point. Continue all medications as previously prescribed.  7. Discuss prior chemotherapy induced neutropenia. He will receive Neulasta with his last cycle.  Discuss use of Claritin to prevent Neulasta induced pain. 8. Discuss magnesium level of 2.0 today. Continue Mag-Ox 400 mg BID. If patient develops diarrhea, he was instructed to call the clinic to discuss.   9. Discuss follow up with urology. Patient has a past medical history significant for urolithiasis. He is scheduled to see Dr. John Giovanni on 09/22/2017 10. RTC in 1 week for MD assessment, labs (CBC with diff, CMP, Mg), and cycle #12 FOLFOX if counts have recovered.     [name], NP  09/06/2017, 9:58 AM   I saw and evaluated the patient,  participating in the key portions of the service and reviewing pertinent diagnostic studies and records.  I reviewed the nurse practitioner's note and agree with the findings and the plan.  The assessment and plan were discussed with the patient.  Several questions were asked by the patient and answered.   Nolon Stalls, MD 09/06/2017,9:58 AM

## 2017-09-06 ENCOUNTER — Encounter: Payer: Self-pay | Admitting: Hematology and Oncology

## 2017-09-06 ENCOUNTER — Other Ambulatory Visit: Payer: Self-pay

## 2017-09-06 ENCOUNTER — Inpatient Hospital Stay (HOSPITAL_BASED_OUTPATIENT_CLINIC_OR_DEPARTMENT_OTHER): Payer: Medicare Other | Admitting: Hematology and Oncology

## 2017-09-06 ENCOUNTER — Inpatient Hospital Stay: Payer: Medicare Other

## 2017-09-06 VITALS — BP 152/74 | HR 105 | Temp 97.7°F | Resp 18 | Wt 271.8 lb

## 2017-09-06 DIAGNOSIS — D696 Thrombocytopenia, unspecified: Secondary | ICD-10-CM

## 2017-09-06 DIAGNOSIS — D701 Agranulocytosis secondary to cancer chemotherapy: Secondary | ICD-10-CM

## 2017-09-06 DIAGNOSIS — C3412 Malignant neoplasm of upper lobe, left bronchus or lung: Secondary | ICD-10-CM | POA: Diagnosis not present

## 2017-09-06 DIAGNOSIS — Z5111 Encounter for antineoplastic chemotherapy: Secondary | ICD-10-CM | POA: Diagnosis not present

## 2017-09-06 DIAGNOSIS — C182 Malignant neoplasm of ascending colon: Secondary | ICD-10-CM

## 2017-09-06 DIAGNOSIS — D509 Iron deficiency anemia, unspecified: Secondary | ICD-10-CM | POA: Diagnosis not present

## 2017-09-06 DIAGNOSIS — C184 Malignant neoplasm of transverse colon: Secondary | ICD-10-CM | POA: Diagnosis not present

## 2017-09-06 DIAGNOSIS — Z7189 Other specified counseling: Secondary | ICD-10-CM

## 2017-09-06 LAB — CBC WITH DIFFERENTIAL/PLATELET
BASOS ABS: 0 10*3/uL (ref 0–0.1)
BASOS PCT: 1 %
EOS ABS: 0.1 10*3/uL (ref 0–0.7)
EOS PCT: 1 %
HCT: 36.4 % — ABNORMAL LOW (ref 40.0–52.0)
Hemoglobin: 12.3 g/dL — ABNORMAL LOW (ref 13.0–18.0)
LYMPHS ABS: 1.7 10*3/uL (ref 1.0–3.6)
Lymphocytes Relative: 21 %
MCH: 34.4 pg — AB (ref 26.0–34.0)
MCHC: 33.8 g/dL (ref 32.0–36.0)
MCV: 101.8 fL — ABNORMAL HIGH (ref 80.0–100.0)
Monocytes Absolute: 0.5 10*3/uL (ref 0.2–1.0)
Monocytes Relative: 6 %
Neutro Abs: 5.7 10*3/uL (ref 1.4–6.5)
Neutrophils Relative %: 71 %
PLATELETS: 93 10*3/uL — AB (ref 150–440)
RBC: 3.58 MIL/uL — AB (ref 4.40–5.90)
RDW: 16.1 % — ABNORMAL HIGH (ref 11.5–14.5)
WBC: 8 10*3/uL (ref 3.8–10.6)

## 2017-09-06 LAB — COMPREHENSIVE METABOLIC PANEL
ALT: 55 U/L (ref 17–63)
AST: 49 U/L — ABNORMAL HIGH (ref 15–41)
Albumin: 3.8 g/dL (ref 3.5–5.0)
Alkaline Phosphatase: 107 U/L (ref 38–126)
Anion gap: 9 (ref 5–15)
BILIRUBIN TOTAL: 0.6 mg/dL (ref 0.3–1.2)
BUN: 20 mg/dL (ref 6–20)
CALCIUM: 9.4 mg/dL (ref 8.9–10.3)
CO2: 23 mmol/L (ref 22–32)
CREATININE: 1.01 mg/dL (ref 0.61–1.24)
Chloride: 104 mmol/L (ref 101–111)
GFR calc Af Amer: >60 mL/min (ref >60)
Glucose, Bld: 303 mg/dL — ABNORMAL HIGH (ref 65–99)
Potassium: 4.5 mmol/L (ref 3.5–5.1)
Sodium: 136 mmol/L (ref 135–145)
TOTAL PROTEIN: 7 g/dL (ref 6.5–8.1)

## 2017-09-06 LAB — MAGNESIUM: MAGNESIUM: 2 mg/dL (ref 1.7–2.4)

## 2017-09-06 MED ORDER — SODIUM CHLORIDE 0.9% FLUSH
10.0000 mL | INTRAVENOUS | Status: DC | PRN
  Administered 2017-09-06: 10 mL via INTRAVENOUS
  Filled 2017-09-06: qty 10

## 2017-09-06 MED ORDER — HEPARIN SOD (PORK) LOCK FLUSH 100 UNIT/ML IV SOLN
500.0000 [IU] | Freq: Once | INTRAVENOUS | Status: AC
  Administered 2017-09-06: 500 [IU] via INTRAVENOUS
  Filled 2017-09-06: qty 5

## 2017-09-12 DIAGNOSIS — D696 Thrombocytopenia, unspecified: Secondary | ICD-10-CM | POA: Insufficient documentation

## 2017-09-12 NOTE — Progress Notes (Signed)
[facility] day:  09/13/2017  Chief Complaint: Donald Mcdowell. is a 72 y.o. male with stage IIIB colon cancer who is seen for assessment prior to cycle #12 FOLFOX chemotherapy.  HPI:   The patient was last seen in the medical oncology clinic on 09/06/2017.  At that time, patient was feeling "ok".  He denied any nausea, vomiting, or significant changes to his bowel habits.  He complained of chronically loose stools.  He also complained of pain in his RIGHT foot and LEFT knee.  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  He has diabetic neuropathy in his right foot.  Continues to have chronic exertional dyspnea and fatigue. Continued to struggle with blood sugars at home. Exam was stable.  WBC 8000 ([place]). Platelets 93,000.  Blood sugar elevated 303.  Cycle #12 FOLFOX chemotherapy postponed due to chemotherapy-induced thrombocytopenia.  In the interim, patient has been experiencing intermittent lower abdominal pain. His bowel movements have been more more formed the last couple of days. Bowels are normally loose. He denies "gas pains". Diet has been eating lots of fruits and vegetables. Overall her feels "pretty fair".   Patient denies that he has experienced any B symptoms. He denies any interval infections. Patient has continued cold neuropathy in his hands is persistent. He states, "My RIGHT hand is always colder than my left".   Patient advises that he maintains an adequate appetite. He is eating well. Weight today is 272 lb 1 oz (123.4 kg), which compared to his last visit to the clinic, represents a  1 pound increase.   Patient denies pain in the clinic today.   Past Medical History:  Diagnosis Date  . Abdominal aortic atherosclerosis ([place]) 09/15/2016   CT scan June 2018  . Allergy   . Cancer ([place])   . Coronary artery calcification seen on CAT scan 09/15/2016   Previous smoker; noted on CT scan June 2018  . Diabetes  mellitus without complication ([place])   . GERD (gastroesophageal reflux disease)   . Gout   . Hyperlipidemia   . Hypertension   . Kidney function abnormal    stage 2  . Kidney stones   . Obesity 08/20/2015    Past Surgical History:  Procedure Laterality Date  . COLONOSCOPY WITH PROPOFOL N/A 11/05/2016   Procedure: COLONOSCOPY WITH PROPOFOL;  Surgeon: Lucilla Lame, MD;  Location: [place];  Service: Gastroenterology;  Laterality: N/A;  . ELECTROMAGNETIC NAVIGATION BROCHOSCOPY N/A 12/16/2016   Procedure: ELECTROMAGNETIC NAVIGATION BRONCHOSCOPY;  Surgeon: Flora Lipps, MD;  Location: [facility];  Service: Cardiopulmonary;  Laterality: N/A;  . ESOPHAGOGASTRODUODENOSCOPY (EGD) WITH PROPOFOL N/A 11/05/2016   Procedure: ESOPHAGOGASTRODUODENOSCOPY (EGD) WITH PROPOFOL;  Surgeon: Lucilla Lame, MD;  Location: [place];  Service: Gastroenterology;  Laterality: N/A;  Diabetic - oral meds  . kidney stones    . PARTIAL COLECTOMY N/A 12/28/2016   Procedure: PARTIAL COLECTOMY RIGHT;  Surgeon: Jules Husbands, MD;  Location: [facility];  Service: General;  Laterality: N/A;  . POLYPECTOMY N/A 11/05/2016   Procedure: POLYPECTOMY;  Surgeon: Lucilla Lame, MD;  Location: [place];  Service: Gastroenterology;  Laterality: N/A;  . PORTACATH PLACEMENT Right 01/18/2017   Procedure: INSERTION PORT-A-CATH;  Surgeon: Jules Husbands, MD;  Location: [facility];  Service: General;  Laterality: Right;    Family History  Problem Relation Age of Onset  . Alzheimer's disease Mother   . Heart disease Father   .  Diabetes Father   . Cancer Father        Prostate  . Cancer Sister        skin cancer    Social History:  reports that he quit smoking about 15 months ago. His smoking use included cigarettes. He has never used smokeless tobacco. He reports that he does not drink alcohol or use drugs.  Patient worked for a [employer].  He retired in 1996.  He was in his 48s. Patient was a 1-2 pack  per day smoker since he was a child.  He stopped smoking in 07/2016.  He drank alcohol in the past.  He has 3 living sisters (1 sister died).  He has no children. Patient married to [place].  Communicate with neighbor [name]  [phone]. The patient is alone today.   Allergies: No Known Allergies  Current Medications: Current Outpatient Medications  Medication Sig Dispense Refill  . allopurinol (ZYLOPRIM) 300 MG tablet Take 1 tablet (300 mg total) by mouth daily. 90 tablet 1  . Cyanocobalamin (VITAMIN B-12 PO) Take 1 tablet by mouth daily.    Marland Kitchen glipiZIDE (GLIPIZIDE XL) 10 MG 24 hr tablet Take 1 tablet (10 mg total) by mouth 2 (two) times daily. 180 tablet 1  . glucose blood (ACCU-CHEK COMPACT PLUS) test strip Check blood sugars once a day if desired; LON 99 months; Dx E11.21 102 each 1  . insulin NPH Human (NOVOLIN N RELION) 100 UNIT/ML injection Inject 5 units subcutaneous in the morning; after 3 days, increase to 8 units every morning; if sugars not less than 150, go up to 10 units 10 mL 0  . Insulin Pen Needle 31G X 6 MM MISC Use to inject insulin subcutaneously once a day in the morning 100 each 0  . lidocaine-prilocaine (EMLA) cream Apply to affected area once 30 g 3  . loratadine (CLARITIN) 5 MG chewable tablet Chew 5 mg by mouth daily.    . magnesium oxide (MAG-OX) 400 MG tablet Take 1 tablet (400 mg total) by mouth daily. 90 tablet 1  . metFORMIN (GLUCOPHAGE) 1000 MG tablet Take 1 tablet (1,000 mg total) by mouth 2 (two) times daily with a meal. 180 tablet 1  . nateglinide (STARLIX) 60 MG tablet Take 1 tablet (60 mg total) by mouth 3 (three) times daily with meals. 90 tablet 2  . pantoprazole (PROTONIX) 20 MG tablet Take 1 tablet (20 mg total) by mouth daily. 90 tablet 1  . quinapril (ACCUPRIL) 5 MG tablet Take 1 tablet (5 mg total) by mouth daily. 90 tablet 1  . rosuvastatin (CRESTOR) 10 MG tablet Take 1 tablet (10 mg total) by mouth at bedtime. 90 tablet 1  . saw palmetto 160  MG capsule Take 160 mg 2 (two) times daily by mouth.    . tamsulosin (FLOMAX) 0.4 MG CAPS capsule Take 0.4 mg by mouth daily.     No current facility-administered medications for this visit.    Facility-Administered Medications Ordered in Other Visits  Medication Dose Route Frequency Provider Last Rate Last Dose  . heparin lock flush 100 unit/mL  500 Units Intravenous Once Geo Slone C, MD      . heparin lock flush 100 unit/mL  500 Units Intravenous Once Lequita Asal, MD        Review of Systems  Constitutional: Positive for malaise/fatigue. Negative for diaphoresis, fever and weight loss (weight up 1 pound).  HENT: Negative.  Negative for congestion, ear discharge, ear pain, nosebleeds,  sinus pain and sore throat.   Eyes: Negative.  Negative for double vision, photophobia, pain and discharge.  Respiratory: Positive for shortness of breath (exertional). Negative for cough, hemoptysis and sputum production.   Cardiovascular: Negative.  Negative for chest pain, palpitations, orthopnea, leg swelling and PND.  Gastrointestinal: Positive for abdominal pain (lower). Negative for blood in stool, constipation, diarrhea, melena, nausea and vomiting.  Genitourinary: Negative.  Negative for dysuria, frequency, hematuria and urgency.  Musculoskeletal: Positive for joint pain (left knee). Negative for back pain, falls and myalgias.  Skin: Negative for itching and rash.  Neurological: Positive for tremors (intermittent COLD neuropathy in BILATERAL hands. DIABETIC neuropathy in RIGHT foot.). Negative for dizziness, focal weakness, weakness and headaches.  Endo/Heme/Allergies: Does not bruise/bleed easily.       Diabetes - poorly controlled. Not checking CBGs on a consistent basis. Recently started on nateglinide and NPH insulin.   Psychiatric/Behavioral: Positive for memory loss (mild memory issues). Negative for depression and suicidal ideas. The patient is not nervous/anxious and does not  have insomnia.   All other systems reviewed and are negative.  Performance status (ECOG): 1 - Symptomatic but completely ambulatory   Vital signs: BP (!) 107/59 (BP Location: Left Arm, Patient Position: Sitting)   Pulse (!) 104   Temp 97.6 F (36.4 C) (Tympanic)   Resp 18   Wt 272 lb 1 oz (123.4 kg)   SpO2 95%   BMI 37.95 kg/m   GENERAL:  Well developed, well nourished, heavyset gentleman sitting comfortably in the exam room in no acute distress. MENTAL STATUS:  Alert and oriented to person, place and time. HEAD:  Wearing a black cap.  Gray hair.  Normocephalic, atraumatic, face symmetric, no Cushingoid features. EYES:  Glasses.  Blue eyes.  Pupils equal round and reactive to light and accomodation.  No conjunctivitis or scleral icterus. ENT:  Oropharynx clear without lesion.  Tongue normal. Mucous membranes moist.  RESPIRATORY:  Clear to auscultation without rales, wheezes or rhonchi. CARDIOVASCULAR:  Regular rate and rhythm without murmur, rub or gallop. ABDOMEN:  Soft, non-tender, with active bowel sounds, and no appreciable hepatosplenomegaly.  No guarding or rebound tenderness.  No masses. SKIN:  No rashes, ulcers or lesions. EXTREMITIES: No edema, no skin discoloration or tenderness.  No palpable cords. LYMPH NODES: No palpable cervical, supraclavicular, axillary or inguinal adenopathy  NEUROLOGICAL: Unremarkable. PSYCH:  Appropriate.    Infusion on 09/13/2017  Component Date Value Ref Range Status  . Magnesium 09/13/2017 2.0  1.7 - 2.4 mg/dL Final   Performed at [facility], [address] [place], [place] [address]  . Sodium 09/13/2017 136  135 - 145 mmol/L Final  . Potassium 09/13/2017 4.6  3.5 - 5.1 mmol/L Final  . Chloride 09/13/2017 102  98 - 111 mmol/L Final   Please note change in reference range.  . CO2 09/13/2017 23  22 - 32 mmol/L Final  . Glucose, Bld 09/13/2017 267* 70 - 99 mg/dL Final   Please note change in reference range.  . BUN 09/13/2017 19   8 - 23 mg/dL Final   Please note change in reference range.  . Creatinine, Ser 09/13/2017 1.06  0.61 - 1.24 mg/dL Final  . Calcium 09/13/2017 9.5  8.9 - 10.3 mg/dL Final  . Total Protein 09/13/2017 7.1  6.5 - 8.1 g/dL Final  . Albumin 09/13/2017 3.8  3.5 - 5.0 g/dL Final  . AST 09/13/2017 44* 15 - 41 U/L Final  . ALT 09/13/2017 47* 0 -  44 U/L Final   Please note change in reference range.  . Alkaline Phosphatase 09/13/2017 94  38 - 126 U/L Final  . Total Bilirubin 09/13/2017 0.5  0.3 - 1.2 mg/dL Final  . GFR calc non Af Amer 09/13/2017 >60  >60 mL/min Final  . GFR calc Af Amer 09/13/2017 >60  >60 mL/min Final   Comment: (NOTE) The eGFR has been calculated using the CKD EPI equation. This calculation has not been validated in all clinical situations. eGFR's persistently <60 mL/min signify possible Chronic Kidney Disease.   Georgiann Hahn gap 09/13/2017 11  5 - 15 Final   Performed at [facility], [address] [place], [place] [address]  . WBC 09/13/2017 8.0  3.8 - 10.6 K/uL Final  . RBC 09/13/2017 3.80* 4.40 - 5.90 MIL/uL Final  . Hemoglobin 09/13/2017 13.3  13.0 - 18.0 g/dL Final  . HCT 09/13/2017 38.8* 40.0 - 52.0 % Final  . MCV 09/13/2017 102.0* 80.0 - 100.0 fL Final  . MCH 09/13/2017 35.0* 26.0 - 34.0 pg Final  . MCHC 09/13/2017 34.3  32.0 - 36.0 g/dL Final  . RDW 09/13/2017 16.1* 11.5 - 14.5 % Final  . Platelets 09/13/2017 138* 150 - 440 K/uL Final  . Neutrophils Relative % 09/13/2017 63  % Final  . Neutro Abs 09/13/2017 5.1  1.4 - 6.5 K/uL Final  . Lymphocytes Relative 09/13/2017 28  % Final  . Lymphs Abs 09/13/2017 2.2  1.0 - 3.6 K/uL Final  . Monocytes Relative 09/13/2017 7  % Final  . Monocytes Absolute 09/13/2017 0.5  0.2 - 1.0 K/uL Final  . Eosinophils Relative 09/13/2017 1  % Final  . Eosinophils Absolute 09/13/2017 0.1  0 - 0.7 K/uL Final  . Basophils Relative 09/13/2017 1  % Final  . Basophils Absolute 09/13/2017 0.1  0 - 0.1 K/uL Final   Performed at [facility], [address] [place], [place] [address]    Assessment:  Calem Cocozza. is a 72 y.o. male with stage IIIB transverse colon cancer s/p right partial colectomy on 12/28/2016.  Pathology revealed a 5.5 cm grade II invasive adenocarcinoma of the transverse colon, extending through the muscularis propria and into the adjacent adipose tissue, including the greater omentum. There was metastatic adenocarcinoma in multiple lymph nodes and tumor deposits, at least 5 involved with a total of at least 18 lymph nodes (5 of 18).  Pathologic stage was T3N2a.  CEA was 1.4 on 11/09/2016.  Abdomen and pelvic CT on 11/09/2016 revealed mid transverse colon segment of luminal narrowing with wall thickening compatible with colon cancer.  There was a single enlarged adjacent mesenteric lymph node and prominent subcentimeter lymph nodes anterior to the pancreas which may be metastatic.  There was no evidence of distant metastasis.  There was a left kidney lower pole complex cyst with thin internal septations (Bosniak IIF).  His prostate was enlarged and extended into the floor of bladder.  There was right kidney lower pole punctate nonobstructing nephrolithiasis.  Chest CT on 11/18/2016 revealed no metastatic disease.  There was abrupt termination of the left upper lobe beyond which the distal bronchus appeared dilated with mild branching into the lung parenchyma.  Imaging suggested a bronchocele in the medial aspect of the left upper lobe. Differential included an endobronchial polyp, endobronchial tumor, bronchial stricture or aspirated foreign body.   Electromagnetic navigational bronchoscopy (ENB) on 12/16/2016 revealed non-small cell carcinoma, favor squamous cell lung carcinoma.  Tumor was positive for p40  and negative for cdx-2 and TTF-1.  He received SBRT to the left upper lobe of 5000 cGy in 5 fractions from 02/16/2017 - 03/02/2017.  Chest CT on 07/09/2017  revealed no residual LUL pulmonary  nodule following SBRT treatment. There were no new pulmonary nodules observed.Small distal paraesophageal nodes stable in appearance. Study negative for thoracic metastatic disease.   PET scan on 01/26/2017 revealed a 2.4 x 0.9 cm hypermetabolic medial left upper lobe pulmonary nodule, corresponding to known primary bronchogenic neoplasm. No findings suspicious for metastatic disease.  EGD on 11/05/2016 revealed gastritis.  Pathology confirmed chronic mild gastritis and features of a healing erosion in the stomach.  There was no H pylori, dysplasia or malignancy.  Echocardiogram  On 12/15/2016 revealed mild to moderate aortic valve stenosis. There was abnormal LV relaxation consistent with grade I diastolic dysfunction. Estimated EF was 55-60%.  He is s/p 11 cycles of FOLFOX chemotherapy (03/15/2017 - 08/23/2017).  Cycle #8 was held on 06/21/2017 due to low platelet count of 95,000. Cycle #10 was held on 07/26/2017 due to platelet count of 81,000. Cycle #11 was help on 08/16/2017 due to platelet count of 94,000. He received Neulasta with cycle #11. Cycle #12 ws held on 09/06/2017 due to a low platelet count of 93,000. He has otherwise tolerated chemotherapy well.    He has iron deficiency anemia.  He is on oral iron.  Ferritin was 6 on 09/04/2016.  Hematocrit was 29.7 and hemoglobin 8.8 on 09/04/2016.  Hematocrit was 28.1, hemoglobin 9.3, and MCV 87.9 on 11/09/2016.   Ferritin has been followed: 28 on 10/13/2016, 15 on 01/14/2017, 21 on 03/29/2016, 20 on 04/12/2017, 24 on 04/26/2017, 49 on 07/12/2017,  50 on 08/02/2017, and 48 on 08/16/2017.   Symptomatically, patient is doing well overall. He notes some lower abdominal pain. His cold neuropathy is stable. Exam is stable.  WBC is 8000 ([place] 5100). Platelets 138,000.  Magnesium is 2.0.  Blood sugar is 267.  Plan: 1. Labs today: CBC with diff, CMP, Mg 2. Labs reviewed. Blood counts stable and adequate enough for treatment. Will proceed with cycle #12  FOLFOX with Neulasta support. RTC in 2 days for pump disconnect. This is his final cycle of chemotherapy.  3. Anticipate interval CT imaging of the chest, abdomen, and pelvis on 10/11/2017. 4. Review post-treatment surveillance schedule following completion of chemotherapy. We review this frequently with the patient as he tends to forget, which causes him to be anxious about future plans. Patient will be seen in the medical oncology clinic every 3 months for the first year, every 4 months for year 2, every 6 months for years 3-5, and then annually thereafter.  5. Discuss symptom management.  Patient has antiemetics and pain medications at home to use on a PRN basis. Patient  advising that the  prescribed interventions are adequate at this point. Continue all medications as previously prescribed.  6. Discuss normal magnesium. Magnesium 2.0 today. Continue Mag-ox 400 mg BID. 7. Discuss follow up with urology. Patient has a past medical history significant for urolithiasis. He is scheduled to see Dr. John Giovanni on 09/22/2017 8. Discuss HYPERglycemia. Patient's blood glucose is 267 today. Started on nateglinide, which had no effect on his glucose readings. Started NPH insulin on 08/24/2017. Follow up with Dr. Sanda Klein for continued monitoring and treatment.  9. RTC on 10/14/2017 for MD assessment, labs (CBC with diff, CMP, Mg), and review of scans.    [name], NP  09/13/2017, 11:28 AM   I saw  and evaluated the patient, participating in the key portions of the service and reviewing pertinent diagnostic studies and records.  I reviewed the nurse practitioner's note and agree with the findings and the plan.  The assessment and plan were discussed with the patient.  Several questions were asked by the patient and answered.   Nolon Stalls, MD 09/13/2017,11:28 AM

## 2017-09-13 ENCOUNTER — Inpatient Hospital Stay: Payer: Medicare Other | Attending: Hematology and Oncology

## 2017-09-13 ENCOUNTER — Inpatient Hospital Stay (HOSPITAL_BASED_OUTPATIENT_CLINIC_OR_DEPARTMENT_OTHER): Payer: Medicare Other | Admitting: Hematology and Oncology

## 2017-09-13 ENCOUNTER — Encounter: Payer: Self-pay | Admitting: Hematology and Oncology

## 2017-09-13 ENCOUNTER — Inpatient Hospital Stay: Payer: Medicare Other

## 2017-09-13 VITALS — BP 107/59 | HR 104 | Temp 97.6°F | Resp 18 | Wt 272.1 lb

## 2017-09-13 DIAGNOSIS — D509 Iron deficiency anemia, unspecified: Secondary | ICD-10-CM | POA: Insufficient documentation

## 2017-09-13 DIAGNOSIS — C184 Malignant neoplasm of transverse colon: Secondary | ICD-10-CM

## 2017-09-13 DIAGNOSIS — Z7189 Other specified counseling: Secondary | ICD-10-CM

## 2017-09-13 DIAGNOSIS — Z452 Encounter for adjustment and management of vascular access device: Secondary | ICD-10-CM | POA: Diagnosis not present

## 2017-09-13 DIAGNOSIS — Z5189 Encounter for other specified aftercare: Secondary | ICD-10-CM | POA: Diagnosis not present

## 2017-09-13 DIAGNOSIS — Z5111 Encounter for antineoplastic chemotherapy: Secondary | ICD-10-CM | POA: Insufficient documentation

## 2017-09-13 DIAGNOSIS — E1140 Type 2 diabetes mellitus with diabetic neuropathy, unspecified: Secondary | ICD-10-CM | POA: Diagnosis not present

## 2017-09-13 DIAGNOSIS — R1030 Lower abdominal pain, unspecified: Secondary | ICD-10-CM

## 2017-09-13 DIAGNOSIS — G608 Other hereditary and idiopathic neuropathies: Secondary | ICD-10-CM

## 2017-09-13 LAB — CBC WITH DIFFERENTIAL/PLATELET
Basophils Absolute: 0.1 10*3/uL (ref 0–0.1)
Basophils Relative: 1 %
Eosinophils Absolute: 0.1 10*3/uL (ref 0–0.7)
Eosinophils Relative: 1 %
HCT: 38.8 % — ABNORMAL LOW (ref 40.0–52.0)
Hemoglobin: 13.3 g/dL (ref 13.0–18.0)
Lymphocytes Relative: 28 %
Lymphs Abs: 2.2 10*3/uL (ref 1.0–3.6)
MCH: 35 pg — ABNORMAL HIGH (ref 26.0–34.0)
MCHC: 34.3 g/dL (ref 32.0–36.0)
MCV: 102 fL — ABNORMAL HIGH (ref 80.0–100.0)
Monocytes Absolute: 0.5 10*3/uL (ref 0.2–1.0)
Monocytes Relative: 7 %
Neutro Abs: 5.1 10*3/uL (ref 1.4–6.5)
Neutrophils Relative %: 63 %
Platelets: 138 10*3/uL — ABNORMAL LOW (ref 150–440)
RBC: 3.8 MIL/uL — ABNORMAL LOW (ref 4.40–5.90)
RDW: 16.1 % — ABNORMAL HIGH (ref 11.5–14.5)
WBC: 8 10*3/uL (ref 3.8–10.6)

## 2017-09-13 LAB — COMPREHENSIVE METABOLIC PANEL
ALT: 47 U/L — ABNORMAL HIGH (ref 0–44)
AST: 44 U/L — ABNORMAL HIGH (ref 15–41)
Albumin: 3.8 g/dL (ref 3.5–5.0)
Alkaline Phosphatase: 94 U/L (ref 38–126)
Anion gap: 11 (ref 5–15)
BUN: 19 mg/dL (ref 8–23)
CO2: 23 mmol/L (ref 22–32)
Calcium: 9.5 mg/dL (ref 8.9–10.3)
Chloride: 102 mmol/L (ref 98–111)
Creatinine, Ser: 1.06 mg/dL (ref 0.61–1.24)
GFR calc Af Amer: >60 mL/min (ref >60)
GFR calc non Af Amer: >60 mL/min (ref >60)
Glucose, Bld: 267 mg/dL — ABNORMAL HIGH (ref 70–99)
Potassium: 4.6 mmol/L (ref 3.5–5.1)
Sodium: 136 mmol/L (ref 135–145)
Total Bilirubin: 0.5 mg/dL (ref 0.3–1.2)
Total Protein: 7.1 g/dL (ref 6.5–8.1)

## 2017-09-13 LAB — MAGNESIUM: Magnesium: 2 mg/dL (ref 1.7–2.4)

## 2017-09-13 MED ORDER — LEUCOVORIN CALCIUM INJECTION 350 MG
1000.0000 mg | Freq: Once | INTRAVENOUS | Status: AC
  Administered 2017-09-13: 1000 mg via INTRAVENOUS
  Filled 2017-09-13: qty 50

## 2017-09-13 MED ORDER — SODIUM CHLORIDE 0.9% FLUSH
10.0000 mL | Freq: Once | INTRAVENOUS | Status: AC
  Administered 2017-09-13: 10 mL via INTRAVENOUS
  Filled 2017-09-13: qty 10

## 2017-09-13 MED ORDER — HEPARIN SOD (PORK) LOCK FLUSH 100 UNIT/ML IV SOLN
500.0000 [IU] | Freq: Once | INTRAVENOUS | Status: DC
  Filled 2017-09-13: qty 5

## 2017-09-13 MED ORDER — SODIUM CHLORIDE 0.9 % IV SOLN
2400.0000 mg/m2 | INTRAVENOUS | Status: DC
  Administered 2017-09-13: 5950 mg via INTRAVENOUS
  Filled 2017-09-13: qty 100

## 2017-09-13 MED ORDER — FLUOROURACIL CHEMO INJECTION 2.5 GM/50ML
400.0000 mg/m2 | Freq: Once | INTRAVENOUS | Status: AC
  Administered 2017-09-13: 1000 mg via INTRAVENOUS
  Filled 2017-09-13: qty 20

## 2017-09-13 MED ORDER — DEXTROSE 5 % IV SOLN
Freq: Once | INTRAVENOUS | Status: AC
  Administered 2017-09-13: 12:00:00 via INTRAVENOUS
  Filled 2017-09-13: qty 1000

## 2017-09-13 MED ORDER — PALONOSETRON HCL INJECTION 0.25 MG/5ML
0.2500 mg | Freq: Once | INTRAVENOUS | Status: AC
  Administered 2017-09-13: 0.25 mg via INTRAVENOUS
  Filled 2017-09-13: qty 5

## 2017-09-13 MED ORDER — DEXAMETHASONE SODIUM PHOSPHATE 10 MG/ML IJ SOLN
10.0000 mg | Freq: Once | INTRAMUSCULAR | Status: AC
  Administered 2017-09-13: 10 mg via INTRAVENOUS
  Filled 2017-09-13: qty 1

## 2017-09-13 MED ORDER — OXALIPLATIN CHEMO INJECTION 100 MG/20ML
200.0000 mg | Freq: Once | INTRAVENOUS | Status: AC
  Administered 2017-09-13: 200 mg via INTRAVENOUS
  Filled 2017-09-13: qty 40

## 2017-09-13 NOTE — Progress Notes (Signed)
Pt in for follow up denies any concerns or difficulties.

## 2017-09-15 ENCOUNTER — Inpatient Hospital Stay: Payer: Medicare Other

## 2017-09-15 VITALS — BP 145/72 | HR 104 | Temp 96.8°F | Resp 18

## 2017-09-15 DIAGNOSIS — C184 Malignant neoplasm of transverse colon: Secondary | ICD-10-CM

## 2017-09-15 DIAGNOSIS — Z5111 Encounter for antineoplastic chemotherapy: Secondary | ICD-10-CM | POA: Diagnosis not present

## 2017-09-15 MED ORDER — HEPARIN SOD (PORK) LOCK FLUSH 100 UNIT/ML IV SOLN
500.0000 [IU] | Freq: Once | INTRAVENOUS | Status: AC | PRN
  Administered 2017-09-15: 500 [IU]
  Filled 2017-09-15: qty 5

## 2017-09-15 MED ORDER — PEGFILGRASTIM-CBQV 6 MG/0.6ML ~~LOC~~ SOSY
6.0000 mg | PREFILLED_SYRINGE | Freq: Once | SUBCUTANEOUS | Status: AC
  Administered 2017-09-15: 6 mg via SUBCUTANEOUS
  Filled 2017-09-15: qty 0.6

## 2017-09-17 ENCOUNTER — Encounter: Payer: Self-pay | Admitting: Family Medicine

## 2017-09-17 MED ORDER — INSULIN NPH (HUMAN) (ISOPHANE) 100 UNIT/ML ~~LOC~~ SUSP: SUBCUTANEOUS | 0 refills | Status: DC

## 2017-09-22 ENCOUNTER — Encounter: Payer: Self-pay | Admitting: Urology

## 2017-09-22 ENCOUNTER — Encounter: Payer: Self-pay | Admitting: Family Medicine

## 2017-09-22 ENCOUNTER — Ambulatory Visit: Payer: Medicare Other | Admitting: Urology

## 2017-09-22 VITALS — BP 167/71 | HR 103 | Resp 16 | Ht 71.0 in | Wt 272.6 lb

## 2017-09-22 DIAGNOSIS — N281 Cyst of kidney, acquired: Secondary | ICD-10-CM

## 2017-09-22 DIAGNOSIS — N138 Other obstructive and reflux uropathy: Secondary | ICD-10-CM

## 2017-09-22 DIAGNOSIS — N401 Enlarged prostate with lower urinary tract symptoms: Secondary | ICD-10-CM

## 2017-09-22 DIAGNOSIS — N200 Calculus of kidney: Secondary | ICD-10-CM

## 2017-09-22 NOTE — Progress Notes (Signed)
09/22/2017 9:57 AM   [name] 02/21/1946 [id number]  Referring provider: Arnetha Courser, MD [address] [place], [place] [address]  Chief Complaint  Patient presents with  . Other   Urologic problem list: - history recurrent stone disease; prior shockwave lithotripsy, ureteroscopy and cystolitholapaxy  - BPH with lower urinary tract symptoms; on tamsulosin  - Bosniak 45F renal cyst  HPI: Donald Mcdowell is a 72 year old male who presents for transfer of urologic care.  He has been followed by Dr. Jacqlyn Larsen for greater than 10 years and last saw him at [facility] in June 2018 in follow-up for the above problems.  Since his last visit he states he has been doing well.  He denies renal colic, gross hematuria or bothersome lower urinary tract symptoms.  He is currently undergoing chemotherapy for stage IIIB colon cancer and is scheduled for a CT of the abdomen pelvis on 7/29.   PMH: Past Medical History:  Diagnosis Date  . Abdominal aortic atherosclerosis ([place]) 09/15/2016   CT scan June 2018  . Allergy   . Cancer ([place])   . Coronary artery calcification seen on CAT scan 09/15/2016   Previous smoker; noted on CT scan June 2018  . Diabetes mellitus without complication ([place])   . GERD (gastroesophageal reflux disease)   . Gout   . Hyperlipidemia   . Hypertension   . Kidney function abnormal    stage 2  . Kidney stones   . Obesity 08/20/2015    Surgical History: Past Surgical History:  Procedure Laterality Date  . COLONOSCOPY WITH PROPOFOL N/A 11/05/2016   Procedure: COLONOSCOPY WITH PROPOFOL;  Surgeon: Lucilla Lame, MD;  Location: [facility];  Service: Gastroenterology;  Laterality: N/A;  . ELECTROMAGNETIC NAVIGATION BROCHOSCOPY N/A 12/16/2016   Procedure: ELECTROMAGNETIC NAVIGATION BRONCHOSCOPY;  Surgeon: Flora Lipps, MD;  Location: [facility];  Service: Cardiopulmonary;  Laterality: N/A;  . ESOPHAGOGASTRODUODENOSCOPY (EGD) WITH PROPOFOL N/A 11/05/2016   Procedure: ESOPHAGOGASTRODUODENOSCOPY (EGD) WITH PROPOFOL;  Surgeon: Lucilla Lame, MD;  Location: [facility];  Service: Gastroenterology;  Laterality: N/A;  Diabetic - oral meds  . kidney stones    . PARTIAL COLECTOMY N/A 12/28/2016   Procedure: PARTIAL COLECTOMY RIGHT;  Surgeon: Jules Husbands, MD;  Location: [facility];  Service: General;  Laterality: N/A;  . POLYPECTOMY N/A 11/05/2016   Procedure: POLYPECTOMY;  Surgeon: Lucilla Lame, MD;  Location: [facility];  Service: Gastroenterology;  Laterality: N/A;  . PORTACATH PLACEMENT Right 01/18/2017   Procedure: INSERTION PORT-A-CATH;  Surgeon: Jules Husbands, MD;  Location: [facility];  Service: General;  Laterality: Right;    Home Medications:  Allergies as of 09/22/2017   No Known Allergies     Medication List        Accurate as of 09/22/17  9:57 AM. Always use your most recent med list.          allopurinol 300 MG tablet Commonly known as:  ZYLOPRIM Take 1 tablet (300 mg total) by mouth daily.   glipiZIDE 10 MG 24 hr tablet Commonly known as:  GLIPIZIDE XL Take 1 tablet (10 mg total) by mouth 2 (two) times daily.   glucose blood test strip Commonly known as:  ACCU-CHEK COMPACT PLUS Check blood sugars once a day if desired; LON 99 months; Dx E11.21   insulin NPH Human 100 UNIT/ML injection Commonly known as:  NOVOLIN N RELION Inject 15 subcutaneously every morning   Insulin Pen Needle 31G X 6 MM Misc Use  to inject insulin subcutaneously once a day in the morning   lidocaine-prilocaine cream Commonly known as:  EMLA Apply to affected area once   loratadine 5 MG chewable tablet Commonly known as:  CLARITIN Chew 5 mg by mouth daily.   magnesium oxide 400 MG tablet Commonly known as:  MAG-OX Take 1 tablet (400 mg total) by mouth daily.   metFORMIN 1000 MG tablet Commonly known as:  GLUCOPHAGE Take 1 tablet (1,000 mg total) by mouth 2 (two) times daily with a meal.   nateglinide 60 MG tablet Commonly  known as:  STARLIX Take 1 tablet (60 mg total) by mouth 3 (three) times daily with meals.   pantoprazole 20 MG tablet Commonly known as:  PROTONIX Take 1 tablet (20 mg total) by mouth daily.   quinapril 5 MG tablet Commonly known as:  ACCUPRIL Take 1 tablet (5 mg total) by mouth daily.   rosuvastatin 10 MG tablet Commonly known as:  CRESTOR Take 1 tablet (10 mg total) by mouth at bedtime.   saw palmetto 160 MG capsule Take 160 mg 2 (two) times daily by mouth.   tamsulosin 0.4 MG Caps capsule Commonly known as:  FLOMAX Take 0.4 mg by mouth daily.   VITAMIN B-12 PO Take 1 tablet by mouth daily.       Allergies: No Known Allergies  Family History: Family History  Problem Relation Age of Onset  . Alzheimer's disease Mother   . Heart disease Father   . Diabetes Father   . Cancer Father        Prostate  . Cancer Sister        skin cancer    Social History:  reports that he quit smoking about 15 months ago. His smoking use included cigarettes. He has never used smokeless tobacco. He reports that he does not drink alcohol or use drugs.  ROS: UROLOGY Frequent Urination?: No Hard to postpone urination?: No Burning/pain with urination?: No Get up at night to urinate?: No Leakage of urine?: No Urine stream starts and stops?: No Trouble starting stream?: No Do you have to strain to urinate?: No Blood in urine?: No Urinary tract infection?: No Sexually transmitted disease?: No Injury to kidneys or bladder?: No Painful intercourse?: No Weak stream?: No Erection problems?: No Penile pain?: No  Gastrointestinal Nausea?: No Vomiting?: No Indigestion/heartburn?: No Diarrhea?: No Constipation?: No  Constitutional Fever: No Night sweats?: No Weight loss?: No Fatigue?: No  Skin Skin rash/lesions?: No Itching?: No  Eyes Blurred vision?: No Double vision?: No  Ears/Nose/Throat Sore throat?: No Sinus problems?: No  Hematologic/Lymphatic Swollen glands?:  No Easy bruising?: No  Cardiovascular Leg swelling?: No Chest pain?: No  Respiratory Cough?: No Shortness of breath?: No  Endocrine Excessive thirst?: No  Musculoskeletal Back pain?: No Joint pain?: No  Neurological Headaches?: No Dizziness?: No  Psychologic Depression?: No Anxiety?: No  Physical Exam: BP (!) 167/71   Pulse (!) 103   Resp 16   Ht 5\' 11"  (1.803 m)   Wt 272 lb 9.6 oz (123.7 kg)   SpO2 96%   BMI 38.02 kg/m   Constitutional:  Alert and oriented, No acute distress. HEENT: [place] AT, moist mucus membranes.  Trachea midline, no masses. Cardiovascular: No clubbing, cyanosis, or edema. Respiratory: Normal respiratory effort, no increased work of breathing. GI: Abdomen is soft, nontender, nondistended, no abdominal masses GU: No CVA tenderness.  Prostate 50 g, smooth without nodules Lymph: No cervical or inguinal lymphadenopathy. Skin: No rashes, bruises or suspicious  lesions. Neurologic: Grossly intact, no focal deficits, moving all 4 extremities. Psychiatric: Normal mood and affect.   Assessment & Plan:   72 year old male with history of stone disease, BPH and a complex renal cyst.  He is scheduled for a CT of the abdomen and pelvis later this month which will reassess his cyst and stone status.  He has stable lower urinary tract symptoms on tamsulosin which she will continue.  He had a PSA last year which was 0.4.  He had discussed with Dr. Jacqlyn Larsen about checking his PSA every 3 years.  Follow-up 1 year and he was instructed to call earlier for any changes.   Abbie Sons, [place] [address], [place], [place] [address] [phone]

## 2017-09-30 ENCOUNTER — Other Ambulatory Visit: Payer: Self-pay | Admitting: Family Medicine

## 2017-10-03 DIAGNOSIS — E1129 Type 2 diabetes mellitus with other diabetic kidney complication: Secondary | ICD-10-CM | POA: Diagnosis not present

## 2017-10-07 ENCOUNTER — Encounter: Payer: Self-pay | Admitting: Urgent Care

## 2017-10-11 ENCOUNTER — Ambulatory Visit
Admission: RE | Admit: 2017-10-11 | Discharge: 2017-10-11 | Disposition: A | Discharge status: Home / Self Care | Payer: Medicare Other | Source: Ambulatory Visit | Attending: Urgent Care | Admitting: Urgent Care

## 2017-10-11 DIAGNOSIS — C3490 Malignant neoplasm of unspecified part of unspecified bronchus or lung: Secondary | ICD-10-CM | POA: Diagnosis not present

## 2017-10-11 DIAGNOSIS — C184 Malignant neoplasm of transverse colon: Secondary | ICD-10-CM | POA: Insufficient documentation

## 2017-10-11 DIAGNOSIS — C189 Malignant neoplasm of colon, unspecified: Secondary | ICD-10-CM | POA: Diagnosis not present

## 2017-10-11 DIAGNOSIS — Z5111 Encounter for antineoplastic chemotherapy: Secondary | ICD-10-CM

## 2017-10-11 MED ORDER — IOPAMIDOL (ISOVUE-300) INJECTION 61%
100.0000 mL | Freq: Once | INTRAVENOUS | Status: AC | PRN
  Administered 2017-10-11: 100 mL via INTRAVENOUS

## 2017-10-14 ENCOUNTER — Inpatient Hospital Stay: Payer: Medicare Other | Attending: Hematology and Oncology

## 2017-10-14 ENCOUNTER — Encounter: Payer: Self-pay | Admitting: Hematology and Oncology

## 2017-10-14 ENCOUNTER — Inpatient Hospital Stay (HOSPITAL_BASED_OUTPATIENT_CLINIC_OR_DEPARTMENT_OTHER): Payer: Medicare Other | Admitting: Hematology and Oncology

## 2017-10-14 ENCOUNTER — Other Ambulatory Visit: Payer: Self-pay | Admitting: Hematology and Oncology

## 2017-10-14 ENCOUNTER — Encounter: Payer: Self-pay | Admitting: Urgent Care

## 2017-10-14 VITALS — BP 125/70 | HR 96 | Temp 97.0°F | Resp 18 | Ht 71.0 in | Wt 273.0 lb

## 2017-10-14 DIAGNOSIS — E1165 Type 2 diabetes mellitus with hyperglycemia: Secondary | ICD-10-CM | POA: Insufficient documentation

## 2017-10-14 DIAGNOSIS — C182 Malignant neoplasm of ascending colon: Secondary | ICD-10-CM

## 2017-10-14 DIAGNOSIS — D509 Iron deficiency anemia, unspecified: Secondary | ICD-10-CM | POA: Diagnosis not present

## 2017-10-14 DIAGNOSIS — E1140 Type 2 diabetes mellitus with diabetic neuropathy, unspecified: Secondary | ICD-10-CM | POA: Insufficient documentation

## 2017-10-14 DIAGNOSIS — C3412 Malignant neoplasm of upper lobe, left bronchus or lung: Secondary | ICD-10-CM

## 2017-10-14 DIAGNOSIS — Z85118 Personal history of other malignant neoplasm of bronchus and lung: Secondary | ICD-10-CM | POA: Insufficient documentation

## 2017-10-14 DIAGNOSIS — Z452 Encounter for adjustment and management of vascular access device: Secondary | ICD-10-CM | POA: Diagnosis not present

## 2017-10-14 DIAGNOSIS — C184 Malignant neoplasm of transverse colon: Secondary | ICD-10-CM

## 2017-10-14 DIAGNOSIS — Z7189 Other specified counseling: Secondary | ICD-10-CM

## 2017-10-14 LAB — CBC WITH DIFFERENTIAL/PLATELET
Basophils Absolute: 0 10*3/uL (ref 0–0.1)
Basophils Relative: 1 %
Eosinophils Absolute: 0.1 10*3/uL (ref 0–0.7)
Eosinophils Relative: 2 %
HCT: 38.7 % — ABNORMAL LOW (ref 40.0–52.0)
Hemoglobin: 13 g/dL (ref 13.0–18.0)
Lymphocytes Relative: 34 %
Lymphs Abs: 1.6 10*3/uL (ref 1.0–3.6)
MCH: 34.4 pg — ABNORMAL HIGH (ref 26.0–34.0)
MCHC: 33.5 g/dL (ref 32.0–36.0)
MCV: 102.4 fL — ABNORMAL HIGH (ref 80.0–100.0)
Monocytes Absolute: 0.4 10*3/uL (ref 0.2–1.0)
Monocytes Relative: 8 %
Neutro Abs: 2.7 10*3/uL (ref 1.4–6.5)
Neutrophils Relative %: 55 %
Platelets: 135 10*3/uL — ABNORMAL LOW (ref 150–440)
RBC: 3.78 MIL/uL — ABNORMAL LOW (ref 4.40–5.90)
RDW: 15.8 % — ABNORMAL HIGH (ref 11.5–14.5)
WBC: 4.8 10*3/uL (ref 3.8–10.6)

## 2017-10-14 LAB — COMPREHENSIVE METABOLIC PANEL
ALT: 39 U/L (ref 0–44)
AST: 38 U/L (ref 15–41)
Albumin: 3.9 g/dL (ref 3.5–5.0)
Alkaline Phosphatase: 79 U/L (ref 38–126)
Anion gap: 10 (ref 5–15)
BUN: 18 mg/dL (ref 8–23)
CO2: 24 mmol/L (ref 22–32)
Calcium: 9.6 mg/dL (ref 8.9–10.3)
Chloride: 104 mmol/L (ref 98–111)
Creatinine, Ser: 1.16 mg/dL (ref 0.61–1.24)
GFR calc Af Amer: >60 mL/min (ref >60)
GFR calc non Af Amer: >60 mL/min (ref >60)
Glucose, Bld: 198 mg/dL — ABNORMAL HIGH (ref 70–99)
Potassium: 4.3 mmol/L (ref 3.5–5.1)
Sodium: 138 mmol/L (ref 135–145)
Total Bilirubin: 0.6 mg/dL (ref 0.3–1.2)
Total Protein: 7.6 g/dL (ref 6.5–8.1)

## 2017-10-14 LAB — MAGNESIUM: Magnesium: 1.9 mg/dL (ref 1.7–2.4)

## 2017-10-14 MED ORDER — LIDOCAINE-PRILOCAINE 2.5-2.5 % EX CREA: TOPICAL_CREAM | CUTANEOUS | 3 refills | Status: DC

## 2017-10-14 NOTE — Patient Instructions (Addendum)
1. You will be seen in the medical oncology clinic every:  3 months for the first year  Every 4 months for years 2-3  Every 6 months for years 4-5  Then annually thereafter.   2. You can reduce your Mag-ox to ONCE a day.   3. CONTINUE your iron. If we recheck and your ferritin is up to 70, we can talk about discontinuing it.   4. Will need your port flushed every 6 weeks. Your next one should be on 10/25/2017.  Respectfully, [name], MSN, APRN, FNP-C, CEN Oncology/Hematology Nurse Practitioner  [facility]

## 2017-10-14 NOTE — Progress Notes (Signed)
No new changes noted today 

## 2017-10-14 NOTE — Progress Notes (Signed)
[facility]-  Cancer Center  Clinic day:  10/14/2017  Chief Complaint: Donald F Volpi Jr. is a 72 y.o. male with squamous cell lung cancer s/p SBRT and stage IIIB colon cancer who is seen for review of end of therapy scans and initiation of surveillance.  HPI:   The patient was last seen in the medical oncology clinic on 09/13/2017.  At that time, he was doing well overall. He had some lower abdominal pain. His cold neuropathy was stable. Exam was stable.  WBC was 8000 (ANC 5100). Platelets 138,000.  Magnesium was 2.0 on oral magnesium.  Blood sugar was 267.  He received cycle #12 FOLFOX chemotherapy.  Chest, abdomen, and pelvic CT on 10/11/2017 revealed no evidence of recurrent or metastatic carcinoma in the chest, abdomen or pelvis.  During the interim, patient is doing well. He has no acute complaints today. He is feeling generally  well. He denies shortness of breath. Patient denies bleeding; no hematochezia, melena, or gross hematuria. Patient has not experienced any significant changes to his bowel habits.   Patient notes that he is glad to be done with his treatments, however he remains anxious about the results of his recent scans. Patient denies that he has experienced any B symptoms. He denies any interval infections. Patient advises that he maintains an adequate appetite. He is eating well. Weight today is 273 lb (123.8 kg), which compared to his last visit to the clinic, represents a 1 pound increase.   Patient denies pain in the clinic today.   Past Medical History:  Diagnosis Date  . Abdominal aortic atherosclerosis (HCC) 09/15/2016   CT scan June 2018  . Allergy   . Cancer (HCC)   . Coronary artery calcification seen on CAT scan 09/15/2016   Previous smoker; noted on CT scan June 2018  . Diabetes mellitus without complication (HCC)   . GERD (gastroesophageal reflux disease)   . Gout   . Hyperlipidemia   . Hypertension   . Kidney function abnormal     stage 2  . Kidney stones   . Obesity 08/20/2015    Past Surgical History:  Procedure Laterality Date  . COLONOSCOPY WITH PROPOFOL N/A 11/05/2016   Procedure: COLONOSCOPY WITH PROPOFOL;  Surgeon: Wohl, Darren, MD;  Location: [facility];  Service: Gastroenterology;  Laterality: N/A;  . ELECTROMAGNETIC NAVIGATION BROCHOSCOPY N/A 12/16/2016   Procedure: ELECTROMAGNETIC NAVIGATION BRONCHOSCOPY;  Surgeon: Kasa, Kurian, MD;  Location: [facility];  Service: Cardiopulmonary;  Laterality: N/A;  . ESOPHAGOGASTRODUODENOSCOPY (EGD) WITH PROPOFOL N/A 11/05/2016   Procedure: ESOPHAGOGASTRODUODENOSCOPY (EGD) WITH PROPOFOL;  Surgeon: Wohl, Darren, MD;  Location: [facility];  Service: Gastroenterology;  Laterality: N/A;  Diabetic - oral meds  . kidney stones    . PARTIAL COLECTOMY N/A 12/28/2016   Procedure: PARTIAL COLECTOMY RIGHT;  Surgeon: Pabon, Diego F, MD;  Location: [facility];  Service: General;  Laterality: N/A;  . POLYPECTOMY N/A 11/05/2016   Procedure: POLYPECTOMY;  Surgeon: Wohl, Darren, MD;  Location: [facility];  Service: Gastroenterology;  Laterality: N/A;  . PORTACATH PLACEMENT Right 01/18/2017   Procedure: INSERTION PORT-A-CATH;  Surgeon: Pabon, Diego F, MD;  Location: [facility];  Service: General;  Laterality: Right;    Family History  Problem Relation Age of Onset  . Alzheimer's disease Mother   . Heart disease Father   . Diabetes Father   . Cancer Father        Prostate  . Cancer Sister          skin cancer    Social History:  reports that he quit smoking about 16 months ago. His smoking use included cigarettes. He has never used smokeless tobacco. He reports that he does not drink alcohol or use drugs.  Patient worked for a [employer].  He retired in 1996.  He was in his 15s. Patient was a 1-2 pack per day smoker since he was a child.  He stopped smoking in 07/2016.  He drank alcohol in the past.  He has 3 living sisters (1 sister died).  He has no children.  Patient married to Kunkle.  Communicate with neighbor [name]  [phone]. The patient is alone today.   Allergies: No Known Allergies  Current Medications: Current Outpatient Medications  Medication Sig Dispense Refill  . allopurinol (ZYLOPRIM) 300 MG tablet Take 1 tablet (300 mg total) by mouth daily. 90 tablet 1  . Cyanocobalamin (VITAMIN B-12 PO) Take 1 tablet by mouth daily.    Marland Kitchen glipiZIDE (GLIPIZIDE XL) 10 MG 24 hr tablet Take 1 tablet (10 mg total) by mouth 2 (two) times daily. 180 tablet 1  . loratadine (CLARITIN) 5 MG chewable tablet Chew 5 mg by mouth daily.    . magnesium oxide (MAG-OX) 400 MG tablet Take 1 tablet (400 mg total) by mouth daily. 90 tablet 1  . metFORMIN (GLUCOPHAGE) 1000 MG tablet Take 1 tablet (1,000 mg total) by mouth 2 (two) times daily with a meal. 180 tablet 1  . nateglinide (STARLIX) 60 MG tablet Take 1 tablet (60 mg total) by mouth 3 (three) times daily with meals. 90 tablet 2  . pantoprazole (PROTONIX) 20 MG tablet Take 1 tablet (20 mg total) by mouth daily. 90 tablet 1  . quinapril (ACCUPRIL) 5 MG tablet Take 1 tablet (5 mg total) by mouth daily. 90 tablet 1  . rosuvastatin (CRESTOR) 10 MG tablet Take 1 tablet (10 mg total) by mouth at bedtime. 90 tablet 1  . saw palmetto 160 MG capsule Take 160 mg 2 (two) times daily by mouth.    . tamsulosin (FLOMAX) 0.4 MG CAPS capsule Take 0.4 mg by mouth daily.    Marland Kitchen glucose blood (ACCU-CHEK COMPACT PLUS) test strip Check blood sugars three times a day as desired Dx:E11.29 LON:88 (Patient not taking: Reported on 10/14/2017) 300 each 1  . insulin NPH Human (NOVOLIN N RELION) 100 UNIT/ML injection Inject 15 subcutaneously every morning (Patient not taking: Reported on 10/14/2017) 10 mL 0  . Insulin Pen Needle 31G X 6 MM MISC Use to inject insulin subcutaneously once a day in the morning (Patient not taking: Reported on 10/14/2017) 100 each 0  . lidocaine-prilocaine (EMLA) cream Apply to affected area once (Patient not  taking: Reported on 10/14/2017) 30 g 3   No current facility-administered medications for this visit.    Facility-Administered Medications Ordered in Other Visits  Medication Dose Route Frequency Provider Last Rate Last Dose  . heparin lock flush 100 unit/mL  500 Units Intravenous Once Lequita Asal, MD        Review of Systems  Constitutional: Negative for diaphoresis, fever, malaise/fatigue and weight loss (weight up 1 pound).       "I'm doing good. Im not getting that poison or having to go over to the morgue (infusion) to see the vampires (nurses) anymore".   HENT: Negative.   Eyes: Negative.   Respiratory: Positive for shortness of breath (exertional ). Negative for cough, hemoptysis and sputum production.   Cardiovascular: Negative for chest  pain, palpitations, orthopnea, leg swelling and PND.  Gastrointestinal: Negative for abdominal pain, blood in stool, constipation, diarrhea, melena, nausea and vomiting.  Genitourinary: Negative for dysuria, frequency, hematuria and urgency.  Musculoskeletal: Negative for back pain, falls, joint pain (LEFT knee) and myalgias.  Skin: Negative for itching and rash.  Neurological: Positive for sensory change (intermittent COLD neuropathy in BILATERAL hands. DIABETIC neuropathy in RIGHT foot). Negative for dizziness, tremors, weakness and headaches.       Little neuropathy in fingertips.  Endo/Heme/Allergies: Does not bruise/bleed easily.       Diabetes - unsure of control citing that he does not check CBGs consistently.  Psychiatric/Behavioral: Positive for memory loss (mild memory issues). Negative for depression and suicidal ideas. The patient is not nervous/anxious and does not have insomnia.   All other systems reviewed and are negative.  Performance status (ECOG): 1 - Symptomatic but completely ambulatory  Vital Signs BP 125/70 (Patient Position: Sitting)   Pulse 96   Temp (!) 97 F (36.1 C) (Tympanic)   Resp 18   Ht 5' 11" (1.803  m)   Wt 273 lb (123.8 kg)   SpO2 98%   BMI 38.08 kg/m   Physical Exam  Constitutional: He is oriented to person, place, and time and well-developed, well-nourished, and in no distress.  HENT:  Head: Normocephalic and atraumatic.  Wearing ball cap. Gray hair.  Eyes: Pupils are equal, round, and reactive to light. EOM are normal. No scleral icterus.  Glasses. Blue eyes.   Neck: Normal range of motion. Neck supple. No tracheal deviation present. No thyromegaly present.  Cardiovascular: Normal rate, regular rhythm and normal heart sounds. Exam reveals no gallop and no friction rub.  No murmur heard. Pulmonary/Chest: Effort normal and breath sounds normal. No respiratory distress. He has no wheezes. He has no rales.  Abdominal: Soft. Bowel sounds are normal. He exhibits no distension. There is no tenderness.  Musculoskeletal: Normal range of motion. He exhibits no edema or tenderness.  Lymphadenopathy:    He has no cervical adenopathy.    He has no axillary adenopathy.       Right: No inguinal and no supraclavicular adenopathy present.       Left: No inguinal and no supraclavicular adenopathy present.  Neurological: He is alert and oriented to person, place, and time.  Skin: Skin is warm and dry. No rash noted. No erythema.  Psychiatric: Mood, affect and judgment normal.  Nursing note and vitals reviewed.   Appointment on 10/14/2017  Component Date Value Ref Range Status  . Magnesium 10/14/2017 1.9  1.7 - 2.4 mg/dL Final   Performed at [facility], [address] [place], [place] [address]  . Sodium 10/14/2017 138  135 - 145 mmol/L Final  . Potassium 10/14/2017 4.3  3.5 - 5.1 mmol/L Final  . Chloride 10/14/2017 104  98 - 111 mmol/L Final  . CO2 10/14/2017 24  22 - 32 mmol/L Final  . Glucose, Bld 10/14/2017 198* 70 - 99 mg/dL Final  . BUN 10/14/2017 18  8 - 23 mg/dL Final  . Creatinine, Ser 10/14/2017 1.16  0.61 - 1.24 mg/dL Final  . Calcium 10/14/2017 9.6  8.9 - 10.3  mg/dL Final  . Total Protein 10/14/2017 7.6  6.5 - 8.1 g/dL Final  . Albumin 10/14/2017 3.9  3.5 - 5.0 g/dL Final  . AST 10/14/2017 38  15 - 41 U/L Final  . ALT 10/14/2017 39  0 - 44 U/L Final  . Alkaline Phosphatase 10/14/2017   79  38 - 126 U/L Final  . Total Bilirubin 10/14/2017 0.6  0.3 - 1.2 mg/dL Final  . GFR calc non Af Amer 10/14/2017 >60  >60 mL/min Final  . GFR calc Af Amer 10/14/2017 >60  >60 mL/min Final   Comment: (NOTE) The eGFR has been calculated using the CKD EPI equation. This calculation has not been validated in all clinical situations. eGFR's persistently <60 mL/min signify possible Chronic Kidney Disease.   Georgiann Hahn gap 10/14/2017 10  5 - 15 Final   Performed at [facility], [address] [place], [place] [address]  . WBC 10/14/2017 4.8  3.8 - 10.6 K/uL Final  . RBC 10/14/2017 3.78* 4.40 - 5.90 MIL/uL Final  . Hemoglobin 10/14/2017 13.0  13.0 - 18.0 g/dL Final  . HCT 10/14/2017 38.7* 40.0 - 52.0 % Final  . MCV 10/14/2017 102.4* 80.0 - 100.0 fL Final  . MCH 10/14/2017 34.4* 26.0 - 34.0 pg Final  . MCHC 10/14/2017 33.5  32.0 - 36.0 g/dL Final  . RDW 10/14/2017 15.8* 11.5 - 14.5 % Final  . Platelets 10/14/2017 135* 150 - 440 K/uL Final  . Neutrophils Relative % 10/14/2017 55  % Final  . Neutro Abs 10/14/2017 2.7  1.4 - 6.5 K/uL Final  . Lymphocytes Relative 10/14/2017 34  % Final  . Lymphs Abs 10/14/2017 1.6  1.0 - 3.6 K/uL Final  . Monocytes Relative 10/14/2017 8  % Final  . Monocytes Absolute 10/14/2017 0.4  0.2 - 1.0 K/uL Final  . Eosinophils Relative 10/14/2017 2  % Final  . Eosinophils Absolute 10/14/2017 0.1  0 - 0.7 K/uL Final  . Basophils Relative 10/14/2017 1  % Final  . Basophils Absolute 10/14/2017 0.0  0 - 0.1 K/uL Final   Performed at [facility], [address] [place], [place] [address]    Assessment:  Gable Odonohue. is a 72 y.o. male with stage IIIB transverse colon cancer s/p right partial colectomy on 12/28/2016.   Pathology revealed a 5.5 cm grade II invasive adenocarcinoma of the transverse colon, extending through the muscularis propria and into the adjacent adipose tissue, including the greater omentum. There was metastatic adenocarcinoma in multiple lymph nodes and tumor deposits, at least 5 involved with a total of at least 18 lymph nodes (5 of 18).  Pathologic stage was T3N2a.  CEA was 1.4 on 11/09/2016.  Abdomen and pelvic CT on 11/09/2016 revealed mid transverse colon segment of luminal narrowing with wall thickening compatible with colon cancer.  There was a single enlarged adjacent mesenteric lymph node and prominent subcentimeter lymph nodes anterior to the pancreas which may be metastatic.  There was no evidence of distant metastasis.  There was a left kidney lower pole complex cyst with thin internal septations (Bosniak IIF).  His prostate was enlarged and extended into the floor of bladder.  There was right kidney lower pole punctate nonobstructing nephrolithiasis.  Chest CT on 11/18/2016 revealed no metastatic disease.  There was abrupt termination of the left upper lobe beyond which the distal bronchus appeared dilated with mild branching into the lung parenchyma.  Imaging suggested a bronchocele in the medial aspect of the left upper lobe. Differential included an endobronchial polyp, endobronchial tumor, bronchial stricture or aspirated foreign body.   Electromagnetic navigational bronchoscopy (ENB) on 12/16/2016 revealed non-small cell carcinoma, favor squamous cell lung carcinoma.  Tumor was positive for p40 and negative for cdx-2 and TTF-1.  He received SBRT to the left upper lobe of  5000 cGy in 5 fractions from 02/16/2017 - 03/02/2017.  Chest CT on 07/09/2017  revealed no residual LUL pulmonary nodule following SBRT treatment. There were no new pulmonary nodules observed.Small distal paraesophageal nodes stable in appearance. Study negative for thoracic metastatic disease.   PET scan on  01/26/2017 revealed a 2.4 x 0.9 cm hypermetabolic medial left upper lobe pulmonary nodule, corresponding to known primary bronchogenic neoplasm. No findings suspicious for metastatic disease.  EGD on 11/05/2016 revealed gastritis.  Pathology confirmed chronic mild gastritis and features of a healing erosion in the stomach.  There was no H pylori, dysplasia or malignancy.  Echocardiogram  On 12/15/2016 revealed mild to moderate aortic valve stenosis. There was abnormal LV relaxation consistent with grade I diastolic dysfunction. Estimated EF was 55-60%.  He received 12 cycles of FOLFOX chemotherapy (03/15/2017 - 09/13/2017).    Chest, abdomen, and pelvic CT on 10/11/2017 revealed no evidence of recurrent or metastatic carcinoma in the chest, abdomen or pelvis.  CEA has been followed: 1.4 on 11/09/2016 and 3.9 on 10/14/2017.  He has iron deficiency anemia.  He is on oral iron.  Ferritin was 6 on 09/04/2016.  Hematocrit was 29.7 and hemoglobin 8.8 on 09/04/2016.  Hematocrit was 28.1, hemoglobin 9.3, and MCV 87.9 on 11/09/2016.   Ferritin has been followed: 28 on 10/13/2016, 15 on 01/14/2017, 21 on 03/29/2016, 20 on 04/12/2017, 24 on 04/26/2017, 49 on 07/12/2017,  50 on 08/02/2017, and 48 on 08/16/2017.   Symptomatically, patient is doing well. He is glad to be done with chemotherapy. He notes no acute symptoms and feels generally well. Neuropathy is stable. Exam is unremarkable.  WBC 4800 with an ANC of 2700.  Hemoglobin 13.0.  Platelets 135,000.  Glucose elevated at 198.  Magnesium stable at 1.9.  CEA normal at 3.9.    Plan: 1. Labs today: CBC with diff, CMP, Mg, CEA. 2. Transverse colon cancer - surveillance  CT imaging of the chest abdomen pelvis done on 10/11/2017 personally reviewed by primary attending oncologist.  There is no evidence of residual or recurrent metastatic disease.  Discussed need to follow-up with Dr. Wohl for repeat colonoscopy.  We will schedule appointment prior to  discharge today.  Discussed repeat CT imaging annually for surveillance. 3. Hypomagnesemia -stable  Magnesium level is stable today at 1.9.  Patient currently taking Mag-ox 400 mg twice daily.  Given the stability of his magnesium level today, and the fact that the patient is no longer on active chemotherapy.  We will reduce daily magnesium supplement dose to 1 pill (400 mg) daily. 4. Iron deficiency -stable  Hemoglobin 13.0, hematocrit 38.7, MCV 102.4  Ferritin was last checked in June and found to be stable at 40 on oral iron supplementation once daily.  Patient expresses desires to discontinue therapy.  After discussion, patient agreed to continue oral iron therapy at this time.  We will plan on rechecking ferritin at his next RTC visit.  If ferritin level is at 70 or above we will consider discontinuing oral iron replacement therapy. 5. Neuropathy -stable  Patient has intermittent cold neuropathy in his hands, and diabetic neuropathy in his right foot.  Notes that symptoms are not affecting his day-to-day function, or ability to ambulate.  Patient desires no additional interventions at this time.  We will continue to monitor. 6. HYPERglycemia -chronic  Glucose level elevated at 198 today in clinic.  Patient continues to struggle with diabetes management overall.  Dr. Lada has diligently worked with this patient to achieve adequate   glycemic control, however patient with uncontrolled CBGs likely related failure to comply with recommended dietary guidelines.    He notes that he is taking his medications as prescribed.  Patient continues on nateglinide and NPH insulin.  Patient encouraged to follow-up with Dr. Sanda Klein for ongoing monitoring and management of his diabetes. 7. Port maintenance -ongoing  Patient to RTC on 10/26/2017 for port flush.  Continue port flushes every 6 weeks until removed. 8. RTC in 3 months for MD assessment and labs (CBC with differential, CMP, ferritin, iron  studies, CEA).  In the interim, patient is to contact the clinic with any questions/concerns or acute symptoms that occurred prior to his next visit.   [name], NP  10/14/2017, 11:02 AM   I saw and evaluated the patient, participating in the key portions of the service and reviewing pertinent diagnostic studies and records.  I reviewed the nurse practitioner's note and agree with the findings and the plan.  The assessment and plan were discussed with the patient.  Several questions were asked by the patient and answered.   Nolon Stalls, MD 10/14/2017,11:02 AM

## 2017-10-15 LAB — CEA: CEA: 3.9 ng/mL (ref 0.0–4.7)

## 2017-10-26 ENCOUNTER — Inpatient Hospital Stay: Payer: Medicare Other

## 2017-10-26 DIAGNOSIS — C182 Malignant neoplasm of ascending colon: Secondary | ICD-10-CM

## 2017-10-26 DIAGNOSIS — Z85118 Personal history of other malignant neoplasm of bronchus and lung: Secondary | ICD-10-CM | POA: Diagnosis not present

## 2017-10-26 MED ORDER — SODIUM CHLORIDE 0.9% FLUSH
10.0000 mL | INTRAVENOUS | Status: DC | PRN
  Administered 2017-10-26: 10 mL via INTRAVENOUS
  Filled 2017-10-26: qty 10

## 2017-10-26 MED ORDER — HEPARIN SOD (PORK) LOCK FLUSH 100 UNIT/ML IV SOLN
500.0000 [IU] | Freq: Once | INTRAVENOUS | Status: AC
  Administered 2017-10-26: 500 [IU] via INTRAVENOUS

## 2017-10-28 ENCOUNTER — Encounter: Payer: Self-pay | Admitting: Family Medicine

## 2017-10-28 ENCOUNTER — Ambulatory Visit: Payer: Medicare Other | Admitting: Family Medicine

## 2017-10-28 DIAGNOSIS — E1165 Type 2 diabetes mellitus with hyperglycemia: Secondary | ICD-10-CM

## 2017-10-28 DIAGNOSIS — C3412 Malignant neoplasm of upper lobe, left bronchus or lung: Secondary | ICD-10-CM | POA: Diagnosis not present

## 2017-10-28 DIAGNOSIS — I10 Essential (primary) hypertension: Secondary | ICD-10-CM

## 2017-10-28 DIAGNOSIS — E1121 Type 2 diabetes mellitus with diabetic nephropathy: Secondary | ICD-10-CM

## 2017-10-28 DIAGNOSIS — C184 Malignant neoplasm of transverse colon: Secondary | ICD-10-CM | POA: Diagnosis not present

## 2017-10-28 DIAGNOSIS — M1000 Idiopathic gout, unspecified site: Secondary | ICD-10-CM

## 2017-10-28 DIAGNOSIS — D696 Thrombocytopenia, unspecified: Secondary | ICD-10-CM

## 2017-10-28 DIAGNOSIS — Z5181 Encounter for therapeutic drug level monitoring: Secondary | ICD-10-CM

## 2017-10-28 DIAGNOSIS — T451X5A Adverse effect of antineoplastic and immunosuppressive drugs, initial encounter: Secondary | ICD-10-CM

## 2017-10-28 DIAGNOSIS — E559 Vitamin D deficiency, unspecified: Secondary | ICD-10-CM

## 2017-10-28 DIAGNOSIS — D6959 Other secondary thrombocytopenia: Secondary | ICD-10-CM

## 2017-10-28 DIAGNOSIS — E785 Hyperlipidemia, unspecified: Secondary | ICD-10-CM | POA: Diagnosis not present

## 2017-10-28 DIAGNOSIS — IMO0002 Reserved for concepts with insufficient information to code with codable children: Secondary | ICD-10-CM

## 2017-10-28 DIAGNOSIS — N182 Chronic kidney disease, stage 2 (mild): Secondary | ICD-10-CM | POA: Diagnosis not present

## 2017-10-28 DIAGNOSIS — D649 Anemia, unspecified: Secondary | ICD-10-CM

## 2017-10-28 NOTE — Assessment & Plan Note (Signed)
Check vit D level. 

## 2017-10-28 NOTE — Assessment & Plan Note (Signed)
No recent flares 

## 2017-10-28 NOTE — Assessment & Plan Note (Signed)
Completed chemo

## 2017-10-28 NOTE — Assessment & Plan Note (Signed)
Monitored by Dr. Corcoran 

## 2017-10-28 NOTE — Assessment & Plan Note (Signed)
Encouragement patient to try to lose weight; if he could lose 50 pounds, he might not need insulin at all; try to just lose 10 pounds

## 2017-10-28 NOTE — Assessment & Plan Note (Signed)
controlled 

## 2017-10-28 NOTE — Assessment & Plan Note (Signed)
Completed treatments at the cancer center; he will follow-up with Dr. Mike Gip

## 2017-10-28 NOTE — Assessment & Plan Note (Signed)
Check labs 

## 2017-10-28 NOTE — Assessment & Plan Note (Signed)
Limit NSAIDs

## 2017-10-28 NOTE — Assessment & Plan Note (Signed)
Hgb improved

## 2017-10-28 NOTE — Assessment & Plan Note (Signed)
Foot exam by MD; stay with 50 units of insulin; contact me in one month with range, so I can adjust if needed

## 2017-10-28 NOTE — Assessment & Plan Note (Signed)
Check lipids today 

## 2017-10-28 NOTE — Assessment & Plan Note (Signed)
Seeing Dr. Mike Gip

## 2017-10-28 NOTE — Patient Instructions (Addendum)
We are here for you Check out the information at familydoctor.org entitled "Nutrition for Weight Loss: What You Need to Know about Fad Diets" Try to lose between 1-2 pounds per week by taking in fewer calories and burning off more calories You can succeed by limiting portions, limiting foods dense in calories and fat, becoming more active, and drinking 8 glasses of water a day (64 ounces) Don't skip meals, especially breakfast, as skipping meals may alter your metabolism Do not use over-the-counter weight loss pills or gimmicks that claim rapid weight loss A healthy BMI (or body mass index) is between 18.5 and 24.9 You can calculate your ideal BMI at the [place] website ClubMonetize.fr Stay at fifty (50) units of insulin daily and contact me in one month with readings   Preventing Unhealthy Weight Gain, Adult Staying at a healthy weight is important. When fat builds up in your body, you may become overweight or obese. These conditions put you at greater risk for developing certain health problems, such as heart disease, diabetes, sleeping problems, joint problems, and some cancers. Unhealthy weight gain is often the result of making unhealthy choices in what you eat. It is also a result of not getting enough exercise. You can make changes to your lifestyle to prevent obesity and stay as healthy as possible. What nutrition changes can be made? To maintain a healthy weight and prevent obesity:  Eat only as much as your body needs. To do this: ? Pay attention to signs that you are hungry or full. Stop eating as soon as you feel full. ? If you feel hungry, try drinking water first. Drink enough water so your urine is clear or pale yellow. ? Eat smaller portions. ? Look at serving sizes on food labels. Most foods contain more than one serving per container. ? Eat the recommended amount of calories for your gender and activity level. While most active  people should eat around 2,000 calories per day, if you are trying to lose weight or are not very active, you main need to eat less calories. Talk to your health care provider or dietitian about how many calories you should eat each day.  Choose healthy foods, such as: ? Fruits and vegetables. Try to fill at least half of your plate at each meal with fruits and vegetables. ? Whole grains, such as whole wheat bread, brown rice, and quinoa. ? Lean meats, such as chicken or fish. ? Other healthy proteins, such as beans, eggs, or tofu. ? Healthy fats, such as nuts, seeds, fatty fish, and olive oil. ? Low-fat or fat-free dairy.  Check food labels and avoid food and drinks that: ? Are high in calories. ? Have added sugar. ? Are high in sodium. ? Have saturated fats or trans fats.  Limit how much you eat of the following foods: ? Prepackaged meals. ? Fast food. ? Fried foods. ? Processed meat, such as bacon, sausage, and deli meats. ? Fatty cuts of red meat and poultry with skin.  Cook foods in healthier ways, such as by baking, broiling, or grilling.  When grocery shopping, try to shop around the outside of the store. This helps you buy mostly fresh foods and avoid canned and prepackaged foods.  What lifestyle changes can be made?  Exercise at least 30 minutes 5 or more days each week. Exercising includes brisk walking, yard work, biking, running, swimming, and team sports like basketball and soccer. Ask your health care provider which exercises are safe for you.  Do not use any products that contain nicotine or tobacco, such as cigarettes and e-cigarettes. If you need help quitting, ask your health care provider.  Limit alcohol intake to no more than 1 drink a day for nonpregnant women and 2 drinks a day for men. One drink equals 12 oz of beer, 5 oz of wine, or 1 oz of hard liquor.  Try to get 7-9 hours of sleep each night. What other changes can be made?  Keep a food and activity  journal to keep track of: ? What you ate and how many calories you had. Remember to count sauces, dressings, and side dishes. ? Whether you were active, and what exercises you did. ? Your calorie, weight, and activity goals.  Check your weight regularly. Track any changes. If you notice you have gained weight, make changes to your diet or activity routine.  Avoid taking weight-loss medicines or supplements. Talk to your health care provider before starting any new medicine or supplement.  Talk to your health care provider before trying any new diet or exercise plan. Why are these changes important? Eating healthy, staying active, and having healthy habits not only help prevent obesity, they also:  Help you to manage stress and emotions.  Help you to connect with friends and family.  Improve your self-esteem.  Improve your sleep.  Prevent long-term health problems.  What can happen if changes are not made? Being obese or overweight can cause you to develop joint or bone problems, which can make it hard for you to stay active or do activities you enjoy. Being obese or overweight also puts stress on your heart and lungs and can lead to health problems like diabetes, heart disease, and some cancers. Where to find more information: Talk with your health care provider or a dietitian about healthy eating and healthy lifestyle choices. You may also find other information through these resources:  U.S. Department of Agriculture MyPlate: FormerBoss.no  American Heart Association: www.heart.org  Centers for Disease Control and Prevention: http://www.wolf.info/  Summary  Staying at a healthy weight is important. It helps prevent certain diseases and health problems, such as heart disease, diabetes, joint problems, sleep disorders, and some cancers.  Being obese or overweight can cause you to develop joint or bone problems, which can make it hard for you to stay active or do activities you  enjoy.  You can prevent unhealthy weight gain by eating a healthy diet, exercising regularly, not smoking, limiting alcohol, and getting enough sleep.  Talk with your health care provider or a dietitian for guidance about healthy eating and healthy lifestyle choices. This information is not intended to replace advice given to you by your health care provider. Make sure you discuss any questions you have with your health care provider. Document Released: 03/03/2016 Document Revised: 04/08/2016 Document Reviewed: 04/08/2016 Elsevier Interactive Patient Education  [employer].

## 2017-10-28 NOTE — Progress Notes (Signed)
BP 106/60 (BP Location: Right Arm, Patient Position: Sitting, Cuff Size: Normal)   Pulse (!) 105   Temp 98.2 F (36.8 C) (Oral)   Resp 16   Ht 5\' 11"  (1.803 m)   Wt 279 lb 1.6 oz (126.6 kg)   SpO2 95%   BMI 38.93 kg/m    Subjective:    Patient ID: [name], male    DOB: [birth year], 72 y.o.   MRN: [id number]  HPI: Griffin Gerrard. is a 72 y.o. male  Chief Complaint  Patient presents with  . Diabetes  . Hypertension    HPI   Type 2 diabetes mellitus He is up to 50 units once a day of insulin; FSBS this morning was 151; goes up over 200 after breakfast; bacon and egg and cheese sandwich Lab Results  Component Value Date   HGBA1C 9.8 (H) 07/21/2017    Squamous cell lung cancer s/p SBRT and stage IIIB colon cancer Last chemo was six weeks; energy is low, slept all day yesterday; he fired Dr. 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; no B symptoms Last four CBCs reviewed; Hgb back to normal; WBC normal  No gout flares  HTN; controlled  Vitamin D deficiency; last level 19, no additional vitamin D right now  Anemic; ferritin up to 70; taking iron supplements and vit C right now; per heme-onc  Depression screen [facility] 2/9 10/28/2017 08/24/2017 07/21/2017 03/31/2017 03/22/2017  Decreased Interest 0 0 0 0 0  Down, Depressed, Hopeless 0 0 0 0 0  PHQ - 2 Score 0 0 0 0 0  Altered sleeping - - - - -  Tired, decreased energy - - - - -  Change in appetite - - - - -  Feeling bad or failure about yourself  - - - - -  Trouble concentrating - - - - -  Moving slowly or fidgety/restless - - - - -  Suicidal thoughts - - - - -  PHQ-9 Score - - - - -  Difficult doing work/chores - - - - -    Relevant past medical, surgical, family and social history reviewed Past Medical History:  Diagnosis Date  . Abdominal aortic atherosclerosis ([place]) 09/15/2016   CT scan June 2018  . Allergy   .  Cancer ([place])   . Coronary artery calcification seen on CAT scan 09/15/2016   Previous smoker; noted on CT scan June 2018  . Diabetes mellitus without complication ([place])   . GERD (gastroesophageal reflux disease)   . Gout   . Hyperlipidemia   . Hypertension   . Kidney function abnormal    stage 2  . Kidney stones   . Obesity 08/20/2015   Past Surgical History:  Procedure Laterality Date  . COLONOSCOPY WITH PROPOFOL N/A 11/05/2016   Procedure: COLONOSCOPY WITH PROPOFOL;  Surgeon: Lucilla Lame, MD;  Location: [place];  Service: Gastroenterology;  Laterality: N/A;  . ELECTROMAGNETIC NAVIGATION BROCHOSCOPY N/A 12/16/2016   Procedure: ELECTROMAGNETIC NAVIGATION BRONCHOSCOPY;  Surgeon: Flora Lipps, MD;  Location: [facility];  Service: Cardiopulmonary;  Laterality: N/A;  . ESOPHAGOGASTRODUODENOSCOPY (EGD) WITH PROPOFOL N/A 11/05/2016   Procedure: ESOPHAGOGASTRODUODENOSCOPY (EGD) WITH PROPOFOL;  Surgeon: Lucilla Lame, MD;  Location: [place];  Service: Gastroenterology;  Laterality: N/A;  Diabetic - oral meds  . kidney stones    . PARTIAL COLECTOMY N/A 12/28/2016   Procedure:  PARTIAL COLECTOMY RIGHT;  Surgeon: Jules Husbands, MD;  Location: [facility];  Service: General;  Laterality: N/A;  . POLYPECTOMY N/A 11/05/2016   Procedure: POLYPECTOMY;  Surgeon: Lucilla Lame, MD;  Location: [place];  Service: Gastroenterology;  Laterality: N/A;  . PORTACATH PLACEMENT Right 01/18/2017   Procedure: INSERTION PORT-A-CATH;  Surgeon: Jules Husbands, MD;  Location: [facility];  Service: General;  Laterality: Right;   Family History  Problem Relation Age of Onset  . Alzheimer's disease Mother   . Heart disease Father   . Diabetes Father   . Cancer Father        Prostate  . Cancer Sister        skin cancer   Social History   Tobacco Use  . Smoking status: Former Smoker    Types: Cigarettes    Last attempt to quit: 06/2016    Years since quitting: 1.3  . Smokeless tobacco:  Never Used  Substance Use Topics  . Alcohol use: No    Alcohol/week: 0.0 standard drinks  . Drug use: No    Interim medical history since last visit reviewed. Allergies and medications reviewed  Review of Systems Per HPI unless specifically indicated above     Objective:    BP 106/60 (BP Location: Right Arm, Patient Position: Sitting, Cuff Size: Normal)   Pulse (!) 105   Temp 98.2 F (36.8 C) (Oral)   Resp 16   Ht 5\' 11"  (1.803 m)   Wt 279 lb 1.6 oz (126.6 kg)   SpO2 95%   BMI 38.93 kg/m   Wt Readings from Last 3 Encounters:  10/28/17 279 lb 1.6 oz (126.6 kg)  10/14/17 273 lb (123.8 kg)  09/22/17 272 lb 9.6 oz (123.7 kg)    Physical Exam  Constitutional: He appears well-developed and well-nourished. No distress.  HENT:  Head: Normocephalic and atraumatic.  Eyes: EOM are normal. No scleral icterus.  Neck: No thyromegaly present.  Cardiovascular: Normal rate and regular rhythm.  Pulmonary/Chest: Effort normal and breath sounds normal.  Port site C/D/I without fluctuance or erythema, upper RIGHT side chest wall    Abdominal: Soft. Bowel sounds are normal. He exhibits no distension.  Musculoskeletal: He exhibits no edema.  Neurological: Coordination normal.  Skin: Skin is warm and dry. No pallor.  Psychiatric: He has a normal mood and affect. His behavior is normal. Judgment and thought content normal.  Patient voices frustration at billing issues, cancer treatment   Diabetic Foot Form - Detailed   Diabetic Foot Exam - detailed Diabetic Foot exam was performed with the following findings:  Yes 10/28/2017  9:09 AM  Visual Foot Exam completed.:  Yes  Pulse Foot Exam completed.:  Yes  Right Dorsalis Pedis:  Present Left Dorsalis Pedis:  Present  Sensory Foot Exam Completed.:  Yes Semmes-Weinstein Monofilament Test R Site 1-Great Toe:  Pos L Site 1-Great Toe:  Pos    Comments:  Sensation present all sites, but diminished relative to hands      Results for orders  placed or performed in visit on 10/14/17  CEA  Result Value Ref Range   CEA 3.9 0.0 - 4.7 ng/mL  Magnesium  Result Value Ref Range   Magnesium 1.9 1.7 - 2.4 mg/dL  Comprehensive metabolic panel  Result Value Ref Range   Sodium 138 135 - 145 mmol/L   Potassium 4.3 3.5 - 5.1 mmol/L   Chloride 104 98 - 111 mmol/L   CO2 24 22 - 32 mmol/L  Glucose, Bld 198 (H) 70 - 99 mg/dL   BUN 18 8 - 23 mg/dL   Creatinine, Ser 1.16 0.61 - 1.24 mg/dL   Calcium 9.6 8.9 - 10.3 mg/dL   Total Protein 7.6 6.5 - 8.1 g/dL   Albumin 3.9 3.5 - 5.0 g/dL   AST 38 15 - 41 U/L   ALT 39 0 - 44 U/L   Alkaline Phosphatase 79 38 - 126 U/L   Total Bilirubin 0.6 0.3 - 1.2 mg/dL   GFR calc non Af Amer >60 >60 mL/min   GFR calc Af Amer >60 >60 mL/min   Anion gap 10 5 - 15  CBC with Differential  Result Value Ref Range   WBC 4.8 3.8 - 10.6 K/uL   RBC 3.78 (L) 4.40 - 5.90 MIL/uL   Hemoglobin 13.0 13.0 - 18.0 g/dL   HCT 38.7 (L) 40.0 - 52.0 %   MCV 102.4 (H) 80.0 - 100.0 fL   MCH 34.4 (H) 26.0 - 34.0 pg   MCHC 33.5 32.0 - 36.0 g/dL   RDW 15.8 (H) 11.5 - 14.5 %   Platelets 135 (L) 150 - 440 K/uL   Neutrophils Relative % 55 %   Neutro Abs 2.7 1.4 - 6.5 K/uL   Lymphocytes Relative 34 %   Lymphs Abs 1.6 1.0 - 3.6 K/uL   Monocytes Relative 8 %   Monocytes Absolute 0.4 0.2 - 1.0 K/uL   Eosinophils Relative 2 %   Eosinophils Absolute 0.1 0 - 0.7 K/uL   Basophils Relative 1 %   Basophils Absolute 0.0 0 - 0.1 K/uL      Assessment & Plan:   Problem List Items Addressed This Visit      Cardiovascular and Mediastinum   Essential hypertension, benign    controlled        Respiratory   Malignant neoplasm of upper lobe of left lung (HCC)    Completed treatments at the cancer center; he will follow-up with Dr. Mike Gip        Digestive   Malignant neoplasm of transverse colon ([place])    Seeing Dr. Mike Gip        Endocrine   Uncontrolled type 2 diabetes mellitus with diabetic nephropathy ([place])    Foot  exam by MD; stay with 50 units of insulin; contact me in one month with range, so I can adjust if needed      Relevant Orders   Microalbumin / creatinine urine ratio   Lipid panel   Hemoglobin A1c     Genitourinary   Chronic kidney disease (CKD), stage II (mild) (Chronic)    Limit NSAIDs        Hematopoietic and Hemostatic   Chemotherapy-induced thrombocytopenia    Completed chemo        Other   Vitamin D deficiency    Check vit D level      Relevant Orders   VITAMIN D 25 Hydroxy (Vit-D Deficiency, Fractures)   Thrombocytopenia (HCC)    Monitored by Dr. Mike Gip      Morbid obesity [facility])    Encouragement patient to try to lose weight; if he could lose 50 pounds, he might not need insulin at all; try to just lose 10 pounds      Hyperlipidemia LDL goal <100 (Chronic)    Check lipids today      Relevant Orders   Lipid panel   Gout (Chronic)    No recent flares      Encounter for medication monitoring  Check labs      Anemia    Hgb improved         I talked with patient about his frustrations, supportive listening, had my office manager go in after my visit to talk with him about who to contact at the cancer center about possible billing error so he can get that cleared up  Follow up plan: Return in about 3 months (around 01/28/2018).  An after-visit summary was printed and given to the patient at [place].  Please see the patient instructions which may contain other information and recommendations beyond what is mentioned above in the assessment and plan.  No orders of the defined types were placed in this encounter.   Orders Placed This Encounter  Procedures  . Microalbumin / creatinine urine ratio  . Lipid panel  . Hemoglobin A1c  . VITAMIN D 25 Hydroxy (Vit-D Deficiency, Fractures)

## 2017-10-29 ENCOUNTER — Other Ambulatory Visit: Payer: Self-pay | Admitting: Family Medicine

## 2017-10-29 LAB — LIPID PANEL
CHOL/HDL RATIO: 2.7 (calc) (ref <5.0)
CHOLESTEROL: 105 mg/dL (ref <200)
HDL: 39 mg/dL — ABNORMAL LOW (ref >40)
LDL CHOLESTEROL (CALC): 44 mg/dL
Non-HDL Cholesterol (Calc): 66 mg/dL (calc) (ref <130)
TRIGLYCERIDES: 137 mg/dL (ref <150)

## 2017-10-29 LAB — HEMOGLOBIN A1C
Hgb A1c MFr Bld: 7.9 % of total Hgb — ABNORMAL HIGH (ref <5.7)
Mean Plasma Glucose: 180 (calc)
eAG (mmol/L): 10 (calc)

## 2017-10-29 LAB — VITAMIN D 25 HYDROXY (VIT D DEFICIENCY, FRACTURES): Vit D, 25-Hydroxy: 20 ng/mL — ABNORMAL LOW (ref 30–100)

## 2017-10-29 LAB — MICROALBUMIN / CREATININE URINE RATIO
Creatinine, Urine: 120 mg/dL (ref 20–320)
MICROALB/CREAT RATIO: 8 ug/mg{creat} (ref <30)
Microalb, Ur: 0.9 mg/dL

## 2017-10-29 MED ORDER — VITAMIN D (ERGOCALCIFEROL) 1.25 MG (50000 UNIT) PO CAPS: 50000.0000 [IU] | ORAL_CAPSULE | ORAL | 1 refills | Status: AC

## 2017-10-29 NOTE — Progress Notes (Signed)
Vit D Rx weekly for 8 weeks, then 1000 iu daily

## 2017-11-22 ENCOUNTER — Ambulatory Visit (INDEPENDENT_AMBULATORY_CARE_PROVIDER_SITE_OTHER): Payer: Medicare Other

## 2017-11-22 DIAGNOSIS — Z23 Encounter for immunization: Secondary | ICD-10-CM

## 2017-11-23 ENCOUNTER — Other Ambulatory Visit: Payer: Self-pay | Admitting: Family Medicine

## 2017-12-07 ENCOUNTER — Ambulatory Visit: Payer: Medicare Other

## 2017-12-07 ENCOUNTER — Ambulatory Visit: Payer: Medicare Other | Admitting: Oncology

## 2017-12-07 ENCOUNTER — Inpatient Hospital Stay: Payer: Medicare Other | Attending: Hematology and Oncology

## 2017-12-07 ENCOUNTER — Telehealth: Payer: Self-pay | Admitting: Oncology

## 2017-12-07 DIAGNOSIS — Z452 Encounter for adjustment and management of vascular access device: Secondary | ICD-10-CM | POA: Diagnosis not present

## 2017-12-07 DIAGNOSIS — C182 Malignant neoplasm of ascending colon: Secondary | ICD-10-CM

## 2017-12-07 DIAGNOSIS — Z85038 Personal history of other malignant neoplasm of large intestine: Secondary | ICD-10-CM | POA: Insufficient documentation

## 2017-12-07 MED ORDER — HEPARIN SOD (PORK) LOCK FLUSH 100 UNIT/ML IV SOLN
500.0000 [IU] | Freq: Once | INTRAVENOUS | Status: AC
  Administered 2017-12-07: 500 [IU] via INTRAVENOUS

## 2017-12-07 MED ORDER — SODIUM CHLORIDE 0.9% FLUSH
10.0000 mL | Freq: Once | INTRAVENOUS | Status: AC
  Administered 2017-12-07: 10 mL via INTRAVENOUS
  Filled 2017-12-07: qty 10

## 2017-12-07 NOTE — Telephone Encounter (Signed)
[name] called on behalf of Mr. Tuohy. Patient had port flushed today and came home found small amount of blood coming from his medi port. [name] looked at the port and feels the blood maybe coming out from a small hole.  Patient  put a napkin on top of the port and now bleeding has subsided. This never happens before and patient wants to know if this is normal. No pian, no hematoma.  Advise [name] that patient may apply a small bandage tonight and he can come to cancer center and have RN evaluate his port.

## 2018-01-02 DIAGNOSIS — E1129 Type 2 diabetes mellitus with other diabetic kidney complication: Secondary | ICD-10-CM | POA: Diagnosis not present

## 2018-01-10 ENCOUNTER — Ambulatory Visit: Payer: Medicare Other

## 2018-01-14 ENCOUNTER — Ambulatory Visit: Payer: Medicare Other | Admitting: Hematology and Oncology

## 2018-01-14 ENCOUNTER — Other Ambulatory Visit: Payer: Medicare Other

## 2018-01-18 ENCOUNTER — Inpatient Hospital Stay: Payer: Medicare Other

## 2018-01-18 ENCOUNTER — Inpatient Hospital Stay: Payer: Medicare Other | Attending: Hematology and Oncology | Admitting: Hematology and Oncology

## 2018-01-18 ENCOUNTER — Encounter: Payer: Self-pay | Admitting: Hematology and Oncology

## 2018-01-18 ENCOUNTER — Other Ambulatory Visit: Payer: Self-pay

## 2018-01-18 VITALS — BP 145/69 | HR 77 | Temp 96.7°F | Wt 276.2 lb

## 2018-01-18 DIAGNOSIS — Z9221 Personal history of antineoplastic chemotherapy: Secondary | ICD-10-CM

## 2018-01-18 DIAGNOSIS — C3412 Malignant neoplasm of upper lobe, left bronchus or lung: Secondary | ICD-10-CM

## 2018-01-18 DIAGNOSIS — D696 Thrombocytopenia, unspecified: Secondary | ICD-10-CM | POA: Diagnosis not present

## 2018-01-18 DIAGNOSIS — Z79899 Other long term (current) drug therapy: Secondary | ICD-10-CM

## 2018-01-18 DIAGNOSIS — D509 Iron deficiency anemia, unspecified: Secondary | ICD-10-CM | POA: Insufficient documentation

## 2018-01-18 DIAGNOSIS — Z87891 Personal history of nicotine dependence: Secondary | ICD-10-CM

## 2018-01-18 DIAGNOSIS — Z452 Encounter for adjustment and management of vascular access device: Secondary | ICD-10-CM | POA: Diagnosis not present

## 2018-01-18 DIAGNOSIS — C184 Malignant neoplasm of transverse colon: Secondary | ICD-10-CM

## 2018-01-18 DIAGNOSIS — Z85038 Personal history of other malignant neoplasm of large intestine: Secondary | ICD-10-CM | POA: Insufficient documentation

## 2018-01-18 DIAGNOSIS — Z85118 Personal history of other malignant neoplasm of bronchus and lung: Secondary | ICD-10-CM

## 2018-01-18 LAB — COMPREHENSIVE METABOLIC PANEL
ALT: 26 U/L (ref 0–44)
AST: 24 U/L (ref 15–41)
Albumin: 4 g/dL (ref 3.5–5.0)
Alkaline Phosphatase: 55 U/L (ref 38–126)
Anion gap: 7 (ref 5–15)
BUN: 28 mg/dL — ABNORMAL HIGH (ref 8–23)
CO2: 25 mmol/L (ref 22–32)
Calcium: 9.4 mg/dL (ref 8.9–10.3)
Chloride: 107 mmol/L (ref 98–111)
Creatinine, Ser: 1.02 mg/dL (ref 0.61–1.24)
GFR calc Af Amer: >60 mL/min (ref >60)
GFR calc non Af Amer: >60 mL/min (ref >60)
Glucose, Bld: 103 mg/dL — ABNORMAL HIGH (ref 70–99)
Potassium: 4 mmol/L (ref 3.5–5.1)
Sodium: 139 mmol/L (ref 135–145)
Total Bilirubin: 0.4 mg/dL (ref 0.3–1.2)
Total Protein: 7.2 g/dL (ref 6.5–8.1)

## 2018-01-18 LAB — CBC WITH DIFFERENTIAL/PLATELET
Abs Immature Granulocytes: 0.01 10*3/uL (ref 0.00–0.07)
Basophils Absolute: 0 10*3/uL (ref 0.0–0.1)
Basophils Relative: 1 %
Eosinophils Absolute: 0.1 10*3/uL (ref 0.0–0.5)
Eosinophils Relative: 1 %
HCT: 38.4 % — ABNORMAL LOW (ref 39.0–52.0)
Hemoglobin: 12.4 g/dL — ABNORMAL LOW (ref 13.0–17.0)
Immature Granulocytes: 0 %
Lymphocytes Relative: 39 %
Lymphs Abs: 2.1 10*3/uL (ref 0.7–4.0)
MCH: 31.6 pg (ref 26.0–34.0)
MCHC: 32.3 g/dL (ref 30.0–36.0)
MCV: 97.7 fL (ref 80.0–100.0)
Monocytes Absolute: 0.3 10*3/uL (ref 0.1–1.0)
Monocytes Relative: 6 %
Neutro Abs: 2.8 10*3/uL (ref 1.7–7.7)
Neutrophils Relative %: 53 %
Platelets: 108 10*3/uL — ABNORMAL LOW (ref 150–400)
RBC: 3.93 MIL/uL — ABNORMAL LOW (ref 4.22–5.81)
RDW: 12.8 % (ref 11.5–15.5)
WBC: 5.3 10*3/uL (ref 4.0–10.5)
nRBC: 0 % (ref 0.0–0.2)

## 2018-01-18 LAB — IRON AND TIBC
Iron: 60 ug/dL (ref 45–182)
Saturation Ratios: 19 % (ref 17.9–39.5)
TIBC: 309 ug/dL (ref 250–450)
UIBC: 249 ug/dL

## 2018-01-18 LAB — MAGNESIUM: Magnesium: 2 mg/dL (ref 1.7–2.4)

## 2018-01-18 LAB — FERRITIN: Ferritin: 37 ng/mL (ref 24–336)

## 2018-01-18 MED ORDER — HEPARIN SOD (PORK) LOCK FLUSH 100 UNIT/ML IV SOLN
500.0000 [IU] | Freq: Once | INTRAVENOUS | Status: AC
  Administered 2018-01-18: 500 [IU] via INTRAVENOUS

## 2018-01-18 MED ORDER — SODIUM CHLORIDE 0.9% FLUSH
10.0000 mL | Freq: Once | INTRAVENOUS | Status: AC
  Administered 2018-01-18: 10 mL via INTRAVENOUS
  Filled 2018-01-18: qty 10

## 2018-01-18 NOTE — Progress Notes (Signed)
Patient states "he" has increased his insulin to 45 units every day. Offers no complaints today.

## 2018-01-18 NOTE — Progress Notes (Signed)
[name] day:  01/18/2018  Chief Complaint: Donald Mcdowell. is a 72 y.o. male with squamous cell lung cancer s/p SBRT and stage IIIB colon cancer who is seen for 3 month assessment.  HPI:   The patient was last seen in the medical oncology clinic on 10/14/2017.  At that time, he was doing well. He was glad to be done with chemotherapy. He noted no acute symptoms and felt generally well. Neuropathy was stable.  Exam was unremarkable.  WBC was 4800 with an [place] of 2700.  Hemoglobin was 13.0.  Platelets were 135,000. Glucose was 198.  Magnesium was 1.9. CEA was 3.9.    Symptomatically, patient is having diarrhea.  Patient notes that he has not had solid bowel movements since before his cancer treatments. Since Friday at around 1700, his diarrhea has "squared off" some. Patient has not had any recent follow up appointments with gastroenterology. Patient is due for surveillance EGD/colonoscopy. He has self discontinued several medications as he felt that they were potentially contributory to his loose stools. No longer taking Mg and vitamin C. He continues his oral iron supplement.   Patient denies that he has experienced any B symptoms. He denies any interval infections. Neuropathy is resolving. Patient advises that he maintains an adequate appetite. He is eating well. Weight today is 276 lb 3 oz (125.3 kg), which compared to his last visit to the clinic, represents a 3 pound increase.    Patient denies pain in the clinic today.   Past Medical History:  Diagnosis Date  . Abdominal aortic atherosclerosis ([place]) 09/15/2016   CT scan June 2018  . Allergy   . Cancer ([place])   . Coronary artery calcification seen on CAT scan 09/15/2016   Previous smoker; noted on CT scan June 2018  . Diabetes mellitus without complication ([place])   . GERD (gastroesophageal reflux disease)   . Gout   . Hyperlipidemia   . Hypertension   . Kidney function abnormal    stage 2   . Kidney stones   . Obesity 08/20/2015    Past Surgical History:  Procedure Laterality Date  . COLONOSCOPY WITH PROPOFOL N/A 11/05/2016   Procedure: COLONOSCOPY WITH PROPOFOL;  Surgeon: Lucilla Lame, MD;  Location: [facility];  Service: Gastroenterology;  Laterality: N/A;  . ELECTROMAGNETIC NAVIGATION BROCHOSCOPY N/A 12/16/2016   Procedure: ELECTROMAGNETIC NAVIGATION BRONCHOSCOPY;  Surgeon: Flora Lipps, MD;  Location: [facility];  Service: Cardiopulmonary;  Laterality: N/A;  . ESOPHAGOGASTRODUODENOSCOPY (EGD) WITH PROPOFOL N/A 11/05/2016   Procedure: ESOPHAGOGASTRODUODENOSCOPY (EGD) WITH PROPOFOL;  Surgeon: Lucilla Lame, MD;  Location: [facility];  Service: Gastroenterology;  Laterality: N/A;  Diabetic - oral meds  . kidney stones    . PARTIAL COLECTOMY N/A 12/28/2016   Procedure: PARTIAL COLECTOMY RIGHT;  Surgeon: Jules Husbands, MD;  Location: [facility];  Service: General;  Laterality: N/A;  . POLYPECTOMY N/A 11/05/2016   Procedure: POLYPECTOMY;  Surgeon: Lucilla Lame, MD;  Location: [facility];  Service: Gastroenterology;  Laterality: N/A;  . PORTACATH PLACEMENT Right 01/18/2017   Procedure: INSERTION PORT-A-CATH;  Surgeon: Jules Husbands, MD;  Location: [facility];  Service: General;  Laterality: Right;    Family History  Problem Relation Age of Onset  . Alzheimer's disease Mother   . Heart disease Father   . Diabetes Father   . Cancer Father        Prostate  . Cancer Sister  skin cancer    Social History:  reports that he quit smoking about 19 months ago. His smoking use included cigarettes. He has never used smokeless tobacco. He reports that he does not drink alcohol or use drugs.  Patient worked for a [employer].  He retired in 1996.  He was in his 9s. Patient was a 1-2 pack per day smoker since he was a child.  He stopped smoking in 07/2016.  He drank alcohol in the past.  He has 3 living sisters (1 sister died).  He has no children. Patient  married to [name].  Communicate with neighbor [name]  [phone]. The patient is alone today.   Allergies: No Known Allergies  Current Medications: Current Outpatient Medications  Medication Sig Dispense Refill  . allopurinol (ZYLOPRIM) 300 MG tablet Take 1 tablet (300 mg total) by mouth daily. 90 tablet 1  . Cyanocobalamin (VITAMIN B-12 PO) Take 1 tablet by mouth daily.    Marland Kitchen glipiZIDE (GLIPIZIDE XL) 10 MG 24 hr tablet Take 1 tablet (10 mg total) by mouth 2 (two) times daily. 180 tablet 1  . glucose blood (ACCU-CHEK COMPACT PLUS) test strip Check blood sugars three times a day as desired Dx:E11.29 LON:88 300 each 1  . insulin NPH Human (NOVOLIN N RELION) 100 UNIT/ML injection Inject 15 subcutaneously every morning 10 mL 0  . Insulin Pen Needle 31G X 6 MM MISC Use to inject insulin subcutaneously once a day in the morning 100 each 0  . lidocaine-prilocaine (EMLA) cream Apply to affected area once 30 g 3  . loratadine (CLARITIN) 5 MG chewable tablet Chew 5 mg by mouth daily.    . magnesium oxide (MAG-OX) 400 MG tablet Take 1 tablet (400 mg total) by mouth daily. 90 tablet 1  . metFORMIN (GLUCOPHAGE) 1000 MG tablet Take 1 tablet (1,000 mg total) by mouth 2 (two) times daily with a meal. 180 tablet 1  . nateglinide (STARLIX) 60 MG tablet TAKE 1 TABLET BY MOUTH THREE TIMES DAILY WITH MEALS 90 tablet 2  . pantoprazole (PROTONIX) 20 MG tablet Take 1 tablet (20 mg total) by mouth daily. 90 tablet 1  . quinapril (ACCUPRIL) 5 MG tablet Take 1 tablet (5 mg total) by mouth daily. 90 tablet 1  . rosuvastatin (CRESTOR) 10 MG tablet Take 1 tablet (10 mg total) by mouth at bedtime. 90 tablet 1  . saw palmetto 160 MG capsule Take 160 mg 2 (two) times daily by mouth.    . tamsulosin (FLOMAX) 0.4 MG CAPS capsule Take 0.4 mg by mouth daily.     No current facility-administered medications for this visit.    Facility-Administered Medications Ordered in Other Visits  Medication Dose Route Frequency  Provider Last Rate Last Dose  . heparin lock flush 100 unit/mL  500 Units Intravenous Once Lequita Asal, MD        Review of Systems  Constitutional: Negative for diaphoresis, fever, malaise/fatigue and weight loss (up 3 pounds).       "I am doing good".   HENT: Negative.   Eyes: Negative.   Respiratory: Positive for shortness of breath (exertional). Negative for cough, hemoptysis and sputum production.   Cardiovascular: Negative for chest pain, palpitations, orthopnea, leg swelling and PND.  Gastrointestinal: Positive for diarrhea ("its has squared off some though"). Negative for abdominal pain, blood in stool, constipation, melena, nausea and vomiting.  Genitourinary: Negative for dysuria, frequency, hematuria and urgency.  Musculoskeletal: Positive for joint pain (LEFT knee). Negative for  back pain, falls and myalgias.  Skin: Negative for itching and rash.  Neurological: Positive for sensory change (COLD neuropathy to hands resolved. DIABETIC neuropathy to RIGHT foot; improved some). Negative for dizziness, tremors, weakness and headaches.  Endo/Heme/Allergies: Does not bruise/bleed easily.       PMH (+) for diabetes; unsure on control as he does not regularly check CBGs. Recent up titration of insulin dose.   Psychiatric/Behavioral: Positive for memory loss (mild). Negative for depression and suicidal ideas. The patient is not nervous/anxious and does not have insomnia.   All other systems reviewed and are negative.  Performance status (ECOG): 1 - Symptomatic but completely ambulatory  Vital Signs BP (!) 145/69 (BP Location: Left Arm, Patient Position: Sitting)   Pulse 77   Temp (!) 96.7 F (35.9 C) (Tympanic)   Wt 276 lb 3 oz (125.3 kg)   BMI 38.52 kg/m   Physical Exam  Constitutional: He is oriented to person, place, and time and well-developed, well-nourished, and in no distress. No distress.  HENT:  Head: Normocephalic and atraumatic.  Mouth/Throat: Oropharynx is  clear and moist and mucous membranes are normal.  Wearing a cap.  Gray hair.  Eyes: Pupils are equal, round, and reactive to light. Conjunctivae and EOM are normal. No scleral icterus.  Glasses.  Blue eyes.  Neck: Normal range of motion. Neck supple. No JVD present.  Cardiovascular: Normal rate, regular rhythm, normal heart sounds and intact distal pulses. Exam reveals no gallop and no friction rub.  No murmur heard. Pulmonary/Chest: Effort normal and breath sounds normal. No respiratory distress. He has no wheezes. He has no rales.  Abdominal: Soft. Bowel sounds are normal. He exhibits no distension and no mass. There is no abdominal tenderness. There is no rebound and no guarding.  Musculoskeletal: Normal range of motion. He exhibits no edema or tenderness.  Lymphadenopathy:    He has no cervical adenopathy.    He has no axillary adenopathy.       Right: No inguinal and no supraclavicular adenopathy present.       Left: No inguinal and no supraclavicular adenopathy present.  Neurological: He is alert and oriented to person, place, and time.  Skin: Skin is warm and dry. No rash noted. He is not diaphoretic. No erythema.  Psychiatric: Mood, affect and judgment normal.  Nursing note and vitals reviewed.   Office Visit on 01/18/2018  Component Date Value Ref Range Status  . Sodium 01/18/2018 139  135 - 145 mmol/L Final  . Potassium 01/18/2018 4.0  3.5 - 5.1 mmol/L Final  . Chloride 01/18/2018 107  98 - 111 mmol/L Final  . CO2 01/18/2018 25  22 - 32 mmol/L Final  . Glucose, Bld 01/18/2018 103* 70 - 99 mg/dL Final  . BUN 01/18/2018 28* 8 - 23 mg/dL Final  . Creatinine, Ser 01/18/2018 1.02  0.61 - 1.24 mg/dL Final  . Calcium 01/18/2018 9.4  8.9 - 10.3 mg/dL Final  . Total Protein 01/18/2018 7.2  6.5 - 8.1 g/dL Final  . Albumin 01/18/2018 4.0  3.5 - 5.0 g/dL Final  . AST 01/18/2018 24  15 - 41 U/L Final  . ALT 01/18/2018 26  0 - 44 U/L Final  . Alkaline Phosphatase 01/18/2018 55  38 -  126 U/L Final  . Total Bilirubin 01/18/2018 0.4  0.3 - 1.2 mg/dL Final  . GFR calc non Af Amer 01/18/2018 >60  >60 mL/min Final  . GFR calc Af Amer 01/18/2018 >60  >60 mL/min Final  Comment: (NOTE) The eGFR has been calculated using the CKD EPI equation. This calculation has not been validated in all clinical situations. eGFR's persistently <60 mL/min signify possible Chronic Kidney Disease.   Georgiann Hahn gap 01/18/2018 7  5 - 15 Final   Performed at [facility], [address] [place], [place] [address]  . WBC 01/18/2018 5.3  4.0 - 10.5 K/uL Final  . RBC 01/18/2018 3.93* 4.22 - 5.81 MIL/uL Final  . Hemoglobin 01/18/2018 12.4* 13.0 - 17.0 g/dL Final  . HCT 01/18/2018 38.4* 39.0 - 52.0 % Final  . MCV 01/18/2018 97.7  80.0 - 100.0 fL Final  . MCH 01/18/2018 31.6  26.0 - 34.0 pg Final  . MCHC 01/18/2018 32.3  30.0 - 36.0 g/dL Final  . RDW 01/18/2018 12.8  11.5 - 15.5 % Final  . Platelets 01/18/2018 108* 150 - 400 K/uL Final  . nRBC 01/18/2018 0.0  0.0 - 0.2 % Final  . Neutrophils Relative % 01/18/2018 53  % Final  . Neutro Abs 01/18/2018 2.8  1.7 - 7.7 K/uL Final  . Lymphocytes Relative 01/18/2018 39  % Final  . Lymphs Abs 01/18/2018 2.1  0.7 - 4.0 K/uL Final  . Monocytes Relative 01/18/2018 6  % Final  . Monocytes Absolute 01/18/2018 0.3  0.1 - 1.0 K/uL Final  . Eosinophils Relative 01/18/2018 1  % Final  . Eosinophils Absolute 01/18/2018 0.1  0.0 - 0.5 K/uL Final  . Basophils Relative 01/18/2018 1  % Final  . Basophils Absolute 01/18/2018 0.0  0.0 - 0.1 K/uL Final  . Immature Granulocytes 01/18/2018 0  % Final  . Abs Immature Granulocytes 01/18/2018 0.01  0.00 - 0.07 K/uL Final   Performed at [facility], [address] [place], [place] [address]    Assessment:  Donald Kissick. is a 72 y.o. male with stage IIIB transverse colon cancer s/p right partial colectomy on 12/28/2016.  Pathology revealed a 5.5 cm grade II invasive adenocarcinoma of the transverse  colon, extending through the muscularis propria and into the adjacent adipose tissue, including the greater omentum. There was metastatic adenocarcinoma in multiple lymph nodes and tumor deposits, at least 5 involved with a total of at least 18 lymph nodes (5 of 18).  Pathologic stage was T3N2a.  CEA was 1.4 on 11/09/2016.  Abdomen and pelvic CT on 11/09/2016 revealed mid transverse colon segment of luminal narrowing with wall thickening compatible with colon cancer.  There was a single enlarged adjacent mesenteric lymph node and prominent subcentimeter lymph nodes anterior to the pancreas which may be metastatic.  There was no evidence of distant metastasis.  There was a left kidney lower pole complex cyst with thin internal septations (Bosniak IIF).  His prostate was enlarged and extended into the floor of bladder.  There was right kidney lower pole punctate nonobstructing nephrolithiasis.  Chest CT on 11/18/2016 revealed no metastatic disease.  There was abrupt termination of the left upper lobe beyond which the distal bronchus appeared dilated with mild branching into the lung parenchyma.  Imaging suggested a bronchocele in the medial aspect of the left upper lobe. Differential included an endobronchial polyp, endobronchial tumor, bronchial stricture or aspirated foreign body.   Electromagnetic navigational bronchoscopy (ENB) on 12/16/2016 revealed non-small cell carcinoma, favor squamous cell lung carcinoma.  Tumor was positive for p40 and negative for cdx-2 and TTF-1.  He received SBRT to the left upper lobe of 5000 cGy in 5 fractions from 02/16/2017 - 03/02/2017.  Chest  CT on 07/09/2017  revealed no residual LUL pulmonary nodule following SBRT treatment. There were no new pulmonary nodules observed.Small distal paraesophageal nodes stable in appearance. Study negative for thoracic metastatic disease.   PET scan on 01/26/2017 revealed a 2.4 x 0.9 cm hypermetabolic medial left upper lobe pulmonary  nodule, corresponding to known primary bronchogenic neoplasm. No findings suspicious for metastatic disease.  EGD on 11/05/2016 revealed gastritis.  Pathology confirmed chronic mild gastritis and features of a healing erosion in the stomach.  There was no H pylori, dysplasia or malignancy.  Echocardiogram  On 12/15/2016 revealed mild to moderate aortic valve stenosis. There was abnormal LV relaxation consistent with grade I diastolic dysfunction. Estimated EF was 55-60%.  He received 12 cycles of FOLFOX chemotherapy (03/15/2017 - 09/13/2017).    Chest, abdomen, and pelvic CT on 10/11/2017 revealed no evidence of recurrent or metastatic carcinoma in the chest, abdomen or pelvis.  CEA has been followed: 1.4 on 11/09/2016, 3.9 on 10/14/2017, and 2.0 on 01/18/2018.  He has iron deficiency anemia.  He is on oral iron.  Ferritin was 6 on 09/04/2016.  Hematocrit was 29.7 and hemoglobin 8.8 on 09/04/2016.  Hematocrit was 28.1, hemoglobin 9.3, and MCV 87.9 on 11/09/2016.   Ferritin has been followed: 28 on 10/13/2016, 15 on 01/14/2017, 21 on 03/29/2016, 20 on 04/12/2017, 24 on 04/26/2017, 49 on 07/12/2017,  50 on 08/02/2017, and 48 on 08/16/2017.   Symptomatically, he is doing well overall.  He notes that life is essentially back to normal following his chemotherapy treatments.  He has no acute symptoms, and notes that he feels generally well.  Neuropathy in his hands has resolved.  He continues to have diabetic neuropathy in his right foot.  Patient unsure of diabetic control, citing the fact that he does not routinely check his blood sugars.  He does note a recent up titration in his insulin dose.  Exam is grossly unremarkable.  WBC 5300 ([place] 2800).  Hemoglobin 12.4, hematocrit 38.4, and platelets 108,000.  Plan: 1. Labs today: CBC with differential, CMP, ferritin, iron studies, CEA 2. Transverse colon cancer  Doing well overall.  No symptoms.  Discussed need for gastroenterology follow-up with Dr.  Allen Norris for surveillance EGD and colonoscopy.  Schedule follow-up appointment with gastroenterology prior to patient leaving today.  Reviewed plan for annual CT imaging for surveillance.  Routine post-treatment surveillance schedule reviewed with patient. Patient will be seen in the medical oncology clinic every 3 months for the first year, every 4 months for years 2-3, every 6 months for years 4-5, and then annually thereafter.  3. Stage I squamous cell carcinoma of the LEFT lung  Doing well.  No increased shortness of breath, cough, or hemoptysis.  Continue routine monitoring. 4. Thrombocytopenia  Platelet count low at 108,000 today.  Discussed with patient that this is likely related to persistent myelosuppression following his chemotherapy treatments.  No indication for treatment at this time, as counts are > 50,000.  Patient to contact the clinic for any abnormal bruising or bleeding, or should he develop a petechial rash.  Continue routine lab monitoring. 5. Iron deficiency  Labs reviewed.  Hemoglobin 12.4, hematocrit 38.4, and MCV 97.7.  Ferritin trending down to 37 ng/mL.  Iron saturation 19% with a TIBC of 309 ug/dL.  Patient unsure if he is taking oral iron as recommended at this point.  Review ferritin goal of 100 mg/mL. 6. Hypomagnesemia  Magnesium level stable at 2.0 mg/dL today.  Despite orders for magnesium oxide 400 mg  daily, patient is no longer taking supplementation. 7. Port maintenance  Continue routine port flushes every 6 weeks until removed. 8. RTC in 3 months for MD assessment and labs (CBC with differential, CMP, ferritin, iron studies, CEA).   [name], NP  01/29/18, 11:07 AM   I saw and evaluated the patient, participating in the key portions of the service and reviewing pertinent diagnostic studies and records.  I reviewed the nurse practitioner's note and agree with the findings and the plan.  The assessment and plan were discussed with the  patient.  Multiple questions were asked by the patient and answered.   Nolon Stalls, MD 01-29-2018,11:07 AM

## 2018-01-18 NOTE — Progress Notes (Signed)
Survivorship Care Plan visit completed.  Treatment summary reviewed and given to patient.  ASCO answers booklet reviewed and given to patient.  CARE program and Cancer Transitions discussed with patient along with other resources cancer center offers to patients and caregivers.  Patient verbalized understanding.    

## 2018-01-19 LAB — CEA: CEA: 2 ng/mL (ref 0.0–4.7)

## 2018-01-27 ENCOUNTER — Ambulatory Visit: Payer: Medicare Other | Admitting: Radiation Oncology

## 2018-01-28 ENCOUNTER — Ambulatory Visit: Payer: Medicare Other | Admitting: Family Medicine

## 2018-01-28 ENCOUNTER — Encounter: Payer: Self-pay | Admitting: Family Medicine

## 2018-01-28 DIAGNOSIS — D509 Iron deficiency anemia, unspecified: Secondary | ICD-10-CM

## 2018-01-28 DIAGNOSIS — E1121 Type 2 diabetes mellitus with diabetic nephropathy: Secondary | ICD-10-CM | POA: Diagnosis not present

## 2018-01-28 DIAGNOSIS — D696 Thrombocytopenia, unspecified: Secondary | ICD-10-CM

## 2018-01-28 DIAGNOSIS — IMO0002 Reserved for concepts with insufficient information to code with codable children: Secondary | ICD-10-CM

## 2018-01-28 DIAGNOSIS — I10 Essential (primary) hypertension: Secondary | ICD-10-CM | POA: Diagnosis not present

## 2018-01-28 DIAGNOSIS — E785 Hyperlipidemia, unspecified: Secondary | ICD-10-CM | POA: Diagnosis not present

## 2018-01-28 DIAGNOSIS — E1165 Type 2 diabetes mellitus with hyperglycemia: Secondary | ICD-10-CM | POA: Diagnosis not present

## 2018-01-28 LAB — POCT GLYCOSYLATED HEMOGLOBIN (HGB A1C)
HBA1C, POC (CONTROLLED DIABETIC RANGE): 5.9 % (ref 0.0–7.0)
HBA1C, POC (PREDIABETIC RANGE): 5.9 % (ref 5.7–6.4)
HbA1c POC (<> result, manual entry): 5.9 % (ref 4.0–5.6)
Hemoglobin A1C: 5.9 % — AB (ref 4.0–5.6)

## 2018-01-28 MED ORDER — PANTOPRAZOLE SODIUM 20 MG PO TBEC: 20.0000 mg | DELAYED_RELEASE_TABLET | Freq: Every day | ORAL | 1 refills | Status: DC

## 2018-01-28 MED ORDER — GLIPIZIDE ER 10 MG PO TB24: 10.0000 mg | ORAL_TABLET | Freq: Two times a day (BID) | ORAL | 1 refills | Status: DC

## 2018-01-28 MED ORDER — ROSUVASTATIN CALCIUM 10 MG PO TABS: 10.0000 mg | ORAL_TABLET | Freq: Every day | ORAL | 1 refills | Status: DC

## 2018-01-28 MED ORDER — QUINAPRIL HCL 5 MG PO TABS: 5.0000 mg | ORAL_TABLET | Freq: Every day | ORAL | 1 refills | Status: DC

## 2018-01-28 MED ORDER — METFORMIN HCL 1000 MG PO TABS: 1000.0000 mg | ORAL_TABLET | Freq: Two times a day (BID) | ORAL | 1 refills | Status: DC

## 2018-01-28 MED ORDER — NATEGLINIDE 60 MG PO TABS: 60.0000 mg | ORAL_TABLET | Freq: Three times a day (TID) | ORAL | 1 refills | Status: DC

## 2018-01-28 MED ORDER — INSULIN NPH (HUMAN) (ISOPHANE) 100 UNIT/ML ~~LOC~~ SUSP: 35.0000 [IU] | Freq: Every day | SUBCUTANEOUS | 0 refills | Status: DC

## 2018-01-28 MED ORDER — ALLOPURINOL 300 MG PO TABS: 300.0000 mg | ORAL_TABLET | Freq: Every day | ORAL | 1 refills | Status: DC

## 2018-01-28 NOTE — Assessment & Plan Note (Signed)
Decrease insulin to avoid hypoglycemic episodes; healthy eating and weight loss encouraged

## 2018-01-28 NOTE — Assessment & Plan Note (Signed)
Monitored by heme-onc 

## 2018-01-28 NOTE — Patient Instructions (Addendum)
Please do call Dr. Ree Edman to see her for those places on your left ear, left arm, and left eyelid Let's check your A1c today Keep trying to lose weight Please feel free to eat before your visits with me Decrease your insulin It is okay with me to decrease further to 30 units daily if your sugars are staying under 150

## 2018-01-28 NOTE — Progress Notes (Signed)
BP (!) 120/58   Pulse 82   Temp 98.5 F (36.9 C)   Ht 5\' 11"  (1.803 m)   Wt 277 lb 8 oz (125.9 kg)   SpO2 93%   BMI 38.70 kg/m    Subjective:    Patient ID: Donald Pou., male    DOB: 12/11/1945, 72 y.o.   MRN: [id number]  HPI: Donald Decola. is a 72 y.o. male  Chief Complaint  Patient presents with  . Follow-up    HPI Here for f/u Type 2 diabetes; lowest sugar around 63, it was 70 two hours after eating last night; trying to cut down on the carbs; on 39 units of insulin now; feet are numb from sitting on the table, but not usually numb; A1c 7.9 on Augu 15th  Morbid obesity; hanging steady; appetite is good  Lung cancer and colon cancer; has port, seeing oncologist regularly; goes to see GI later this month  Gout; no gout flares in a while; does not eat [place]  Thrombocytopenia; was 93 during treatments; on the last CBC, platelet count was 108  He had a white head on his left ear; he "operated on it" last night; flicked the white off and it bled; feels a lot better and he pulled the skin off  Depression screen [facility] 2/9 01/28/2018 10/28/2017 08/24/2017 07/21/2017 03/31/2017  Decreased Interest 0 0 0 0 0  Down, Depressed, Hopeless 0 0 0 0 0  PHQ - 2 Score 0 0 0 0 0  Altered sleeping 0 - - - -  Tired, decreased energy 0 - - - -  Change in appetite 0 - - - -  Feeling bad or failure about yourself  0 - - - -  Trouble concentrating 0 - - - -  Moving slowly or fidgety/restless 0 - - - -  Suicidal thoughts 0 - - - -  PHQ-9 Score 0 - - - -  Difficult doing work/chores Not difficult at all - - - -   Fall Risk  01/28/2018 10/28/2017 08/24/2017 08/02/2017 07/21/2017  Falls in the past year? 0 No No No No  Number falls in past yr: - - - - -  Injury with Fall? - - - - -    Relevant past medical, surgical, family and social history reviewed Past Medical History:  Diagnosis Date  . Abdominal aortic atherosclerosis ([place]) 09/15/2016   CT scan June 2018  . Allergy   .  Cancer ([place])   . Coronary artery calcification seen on CAT scan 09/15/2016   Previous smoker; noted on CT scan June 2018  . Diabetes mellitus without complication ([place])   . GERD (gastroesophageal reflux disease)   . Gout   . Hyperlipidemia   . Hypertension   . Kidney function abnormal    stage 2  . Kidney stones   . Obesity 08/20/2015   Past Surgical History:  Procedure Laterality Date  . COLONOSCOPY WITH PROPOFOL N/A 11/05/2016   Procedure: COLONOSCOPY WITH PROPOFOL;  Surgeon: Lucilla Lame, MD;  Location: [place];  Service: Gastroenterology;  Laterality: N/A;  . ELECTROMAGNETIC NAVIGATION BROCHOSCOPY N/A 12/16/2016   Procedure: ELECTROMAGNETIC NAVIGATION BRONCHOSCOPY;  Surgeon: Flora Lipps, MD;  Location: [facility];  Service: Cardiopulmonary;  Laterality: N/A;  . ESOPHAGOGASTRODUODENOSCOPY (EGD) WITH PROPOFOL N/A 11/05/2016   Procedure: ESOPHAGOGASTRODUODENOSCOPY (EGD) WITH PROPOFOL;  Surgeon: Lucilla Lame, MD;  Location: [place];  Service: Gastroenterology;  Laterality: N/A;  Diabetic - oral meds  . kidney stones    .  PARTIAL COLECTOMY N/A 12/28/2016   Procedure: PARTIAL COLECTOMY RIGHT;  Surgeon: Jules Husbands, MD;  Location: [facility];  Service: General;  Laterality: N/A;  . POLYPECTOMY N/A 11/05/2016   Procedure: POLYPECTOMY;  Surgeon: Lucilla Lame, MD;  Location: [place];  Service: Gastroenterology;  Laterality: N/A;  . PORTACATH PLACEMENT Right 01/18/2017   Procedure: INSERTION PORT-A-CATH;  Surgeon: Jules Husbands, MD;  Location: [facility];  Service: General;  Laterality: Right;   Family History  Problem Relation Age of Onset  . Alzheimer's disease Mother   . Heart disease Father   . Diabetes Father   . Cancer Father        Prostate  . Cancer Sister        skin cancer   Social History   Tobacco Use  . Smoking status: Former Smoker    Types: Cigarettes    Last attempt to quit: 06/2016    Years since quitting: 1.6  . Smokeless tobacco:  Never Used  Substance Use Topics  . Alcohol use: No    Alcohol/week: 0.0 standard drinks  . Drug use: No     Office Visit from 01/28/2018 in [facility]  AUDIT-C Score  0      Interim medical history since last visit reviewed. Allergies and medications reviewed  Review of Systems Per HPI unless specifically indicated above     Objective:    BP (!) 120/58   Pulse 82   Temp 98.5 F (36.9 C)   Ht 5\' 11"  (1.803 m)   Wt 277 lb 8 oz (125.9 kg)   SpO2 93%   BMI 38.70 kg/m   Wt Readings from Last 3 Encounters:  01/28/18 277 lb 8 oz (125.9 kg)  01/18/18 276 lb 3 oz (125.3 kg)  10/28/17 279 lb 1.6 oz (126.6 kg)    Physical Exam  Constitutional: He appears well-developed and well-nourished. No distress.  HENT:  Head: Normocephalic and atraumatic.  Eyes: EOM are normal. No scleral icterus.  Neck: No thyromegaly present.  Cardiovascular: Normal rate and regular rhythm.  Pulmonary/Chest: Effort normal and breath sounds normal.  Abdominal: Soft. Bowel sounds are normal. He exhibits no distension.  Musculoskeletal: He exhibits no edema.  Neurological: Coordination normal.  Skin: Skin is warm and dry. No pallor.  Erythematous lesion LEFT ear; rough erythematous place on the left arm; dark lesion on the upper LEFT eyelid  Psychiatric: He has a normal mood and affect. His behavior is normal. Judgment and thought content normal.   Diabetic Foot Form - Detailed   Diabetic Foot Exam - detailed Diabetic Foot exam was performed with the following findings:  Yes 01/28/2018  9:03 AM  Visual Foot Exam completed.:  Yes  Pulse Foot Exam completed.:  Yes  Right Dorsalis Pedis:  Present Left Dorsalis Pedis:  Present  Semmes-Weinstein Monofilament Test       Results for orders placed or performed in visit on 01/28/18  POCT HgB A1C  Result Value Ref Range   Hemoglobin A1C 5.9 (A) 4.0 - 5.6 %   HbA1c POC (<> result, manual entry) 5.9 4.0 - 5.6 %   HbA1c, POC  (prediabetic range) 5.9 5.7 - 6.4 %   HbA1c, POC (controlled diabetic range) 5.9 0.0 - 7.0 %      Assessment & Plan:   Problem List Items Addressed This Visit      Cardiovascular and Mediastinum   Essential hypertension, benign    controlled      Relevant  Medications   quinapril (ACCUPRIL) 5 MG tablet   rosuvastatin (CRESTOR) 10 MG tablet     Endocrine   Uncontrolled type 2 diabetes mellitus with diabetic nephropathy (HCC)    Decrease insulin to avoid hypoglycemic episodes; healthy eating and weight loss encouraged      Relevant Medications   glipiZIDE (GLIPIZIDE XL) 10 MG 24 hr tablet   insulin NPH Human (NOVOLIN N RELION) 100 UNIT/ML injection   metFORMIN (GLUCOPHAGE) 1000 MG tablet   nateglinide (STARLIX) 60 MG tablet   quinapril (ACCUPRIL) 5 MG tablet   rosuvastatin (CRESTOR) 10 MG tablet   Other Relevant Orders   POCT HgB A1C (Completed)     Other   Thrombocytopenia (HCC)    Monitored by heme-onc      Iron deficiency anemia    Monitored by heme-onc      Hyperlipidemia LDL goal <100 (Chronic)    Limit saturated fats; check lipids      Relevant Medications   quinapril (ACCUPRIL) 5 MG tablet   rosuvastatin (CRESTOR) 10 MG tablet       Follow up plan: Return in about 3 months (around 04/30/2018) for follow-up visit with Dr. Sanda Klein (NON-fasting).  An after-visit summary was printed and given to the patient at [place].  Please see the patient instructions which may contain other information and recommendations beyond what is mentioned above in the assessment and plan.  Meds ordered this encounter  Medications  . allopurinol (ZYLOPRIM) 300 MG tablet    Sig: Take 1 tablet (300 mg total) by mouth daily.    Dispense:  90 tablet    Refill:  1  . glipiZIDE (GLIPIZIDE XL) 10 MG 24 hr tablet    Sig: Take 1 tablet (10 mg total) by mouth 2 (two) times daily.    Dispense:  180 tablet    Refill:  1  . insulin NPH Human (NOVOLIN N RELION) 100 UNIT/ML injection     Sig: Inject 0.35 mLs (35 Units total) into the skin daily before breakfast.    Dispense:  10 mL    Refill:  0  . metFORMIN (GLUCOPHAGE) 1000 MG tablet    Sig: Take 1 tablet (1,000 mg total) by mouth 2 (two) times daily with a meal.    Dispense:  180 tablet    Refill:  1    Please consider 90 day supplies to promote better adherence  . nateglinide (STARLIX) 60 MG tablet    Sig: Take 1 tablet (60 mg total) by mouth 3 (three) times daily with meals.    Dispense:  270 tablet    Refill:  1    Please consider 90 day supplies to promote better adherence  . pantoprazole (PROTONIX) 20 MG tablet    Sig: Take 1 tablet (20 mg total) by mouth daily.    Dispense:  90 tablet    Refill:  1  . quinapril (ACCUPRIL) 5 MG tablet    Sig: Take 1 tablet (5 mg total) by mouth daily.    Dispense:  90 tablet    Refill:  1    Please consider 90 day supplies to promote better adherence  . rosuvastatin (CRESTOR) 10 MG tablet    Sig: Take 1 tablet (10 mg total) by mouth at bedtime.    Dispense:  90 tablet    Refill:  1    Orders Placed This Encounter  Procedures  . POCT HgB A1C

## 2018-01-28 NOTE — Assessment & Plan Note (Signed)
controlled 

## 2018-01-28 NOTE — Assessment & Plan Note (Signed)
Limit saturated fats; check lipids

## 2018-02-09 ENCOUNTER — Ambulatory Visit (INDEPENDENT_AMBULATORY_CARE_PROVIDER_SITE_OTHER): Payer: Medicare Other | Admitting: Gastroenterology

## 2018-02-09 ENCOUNTER — Other Ambulatory Visit: Payer: Self-pay

## 2018-02-09 ENCOUNTER — Telehealth: Payer: Self-pay

## 2018-02-09 ENCOUNTER — Encounter: Payer: Self-pay | Admitting: Gastroenterology

## 2018-02-09 VITALS — BP 127/63 | HR 87 | Ht 71.0 in | Wt 285.0 lb

## 2018-02-09 DIAGNOSIS — C184 Malignant neoplasm of transverse colon: Secondary | ICD-10-CM | POA: Diagnosis not present

## 2018-02-09 NOTE — H&P (View-Only) (Signed)
Primary Care Physician: Arnetha Courser, MD  Primary Gastroenterologist:  Dr. Lucilla Lame  Chief Complaint  Patient presents with  . Follow up colon mass    HPI: Donald Mcdowell. is a 72 y.o. male here with a history of a transverse colon mass found by me last year.  The patient had an EGD and colonoscopy done for anemia and was found to have multiple polyps in the transverse colon mass.  The patient is now presently followed by hematology/oncology.  The patient appears to have had surgery by Dr. Dahlia Byes.  Since most recent hemoglobin showed it to be slightly decreased at 2.4.  It appears that his ferritin and iron studies at that time were normal as was his CEA.  Current Outpatient Medications  Medication Sig Dispense Refill  . allopurinol (ZYLOPRIM) 300 MG tablet Take 1 tablet (300 mg total) by mouth daily. 90 tablet 1  . Cyanocobalamin (VITAMIN B-12 PO) Take 1 tablet by mouth daily.    Marland Kitchen glipiZIDE (GLIPIZIDE XL) 10 MG 24 hr tablet Take 1 tablet (10 mg total) by mouth 2 (two) times daily. 180 tablet 1  . glucose blood (ACCU-CHEK COMPACT PLUS) test strip Check blood sugars three times a day as desired Dx:E11.29 LON:88 300 each 1  . insulin NPH Human (NOVOLIN N RELION) 100 UNIT/ML injection Inject 0.35 mLs (35 Units total) into the skin daily before breakfast. 10 mL 0  . Insulin Pen Needle 31G X 6 MM MISC Use to inject insulin subcutaneously once a day in the morning 100 each 0  . metFORMIN (GLUCOPHAGE) 1000 MG tablet Take 1 tablet (1,000 mg total) by mouth 2 (two) times daily with a meal. 180 tablet 1  . nateglinide (STARLIX) 60 MG tablet Take 1 tablet (60 mg total) by mouth 3 (three) times daily with meals. 270 tablet 1  . pantoprazole (PROTONIX) 20 MG tablet Take 1 tablet (20 mg total) by mouth daily. 90 tablet 1  . quinapril (ACCUPRIL) 5 MG tablet Take 1 tablet (5 mg total) by mouth daily. 90 tablet 1  . rosuvastatin (CRESTOR) 10 MG tablet Take 1 tablet (10 mg total) by mouth at  bedtime. 90 tablet 1  . saw palmetto 160 MG capsule Take 160 mg 2 (two) times daily by mouth.    . tamsulosin (FLOMAX) 0.4 MG CAPS capsule Take 0.4 mg by mouth daily.    Marland Kitchen lidocaine-prilocaine (EMLA) cream Apply to affected area once (Patient not taking: Reported on 01/28/2018) 30 g 3  . loratadine (CLARITIN) 5 MG chewable tablet Chew 5 mg by mouth daily.    . magnesium oxide (MAG-OX) 400 MG tablet Take 1 tablet (400 mg total) by mouth daily. (Patient not taking: Reported on 01/28/2018) 90 tablet 1   No current facility-administered medications for this visit.    Facility-Administered Medications Ordered in Other Visits  Medication Dose Route Frequency Provider Last Rate Last Dose  . heparin lock flush 100 unit/mL  500 Units Intravenous Once Lequita Asal, MD        Allergies as of 02/09/2018  . (No Known Allergies)    ROS:  General: Negative for anorexia, weight loss, fever, chills, fatigue, weakness. ENT: Negative for hoarseness, difficulty swallowing , nasal congestion. CV: Negative for chest pain, angina, palpitations, dyspnea on exertion, peripheral edema.  Respiratory: Negative for dyspnea at rest, dyspnea on exertion, cough, sputum, wheezing.  GI: See history of present illness. GU:  Negative for dysuria, hematuria, urinary incontinence, urinary frequency, nocturnal  urination.  Endo: Negative for unusual weight change.    Physical Examination:   BP 127/63   Pulse 87   Ht 5\' 11"  (1.803 m)   Wt 285 lb (129.3 kg)   BMI 39.75 kg/m   General: Well-nourished, well-developed in no acute distress.  Eyes: No icterus. Conjunctivae pink. Mouth: Oropharyngeal mucosa moist and pink , no lesions erythema or exudate. Lungs: Clear to auscultation bilaterally. Non-labored. Heart: Regular rate and rhythm, no murmurs rubs or gallops.  Abdomen: Bowel sounds are normal, nontender, nondistended, no hepatosplenomegaly or masses, no abdominal bruits or hernia , no rebound or guarding.    Extremities: No lower extremity edema. No clubbing or deformities. Neuro: Alert and oriented x 3.  Grossly intact. Skin: Warm and dry, no jaundice.   Psych: Alert and cooperative, normal mood and affect.  Labs:    Imaging Studies: No results found.  Assessment and Plan:   Donald Mcdowell. is a 72 y.o. 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  The patient has gone through chemotherapy.  The patient had multiple polyps at his colonoscopy over a year ago.  The patient will be set up for repeat colonoscopy. I have discussed risks & benefits which include, but are not limited to, bleeding, infection, perforation & drug reaction.  The patient agrees with this plan & written consent will be obtained.       Lucilla Lame, MD. Marval Regal   Note: This dictation was prepared with Dragon dictation along with smaller phrase technology. Any transcriptional errors that result from this process are unintentional.

## 2018-02-09 NOTE — Telephone Encounter (Signed)
Pt would like to know if he should take his insulin on Tue before his procedure?

## 2018-02-09 NOTE — Progress Notes (Signed)
Primary Care Physician: Arnetha Courser, MD  Primary Gastroenterologist:  Dr. Lucilla Lame  Chief Complaint  Patient presents with  . Follow up colon mass    HPI: Donald Rosas. is a 72 y.o. male here with a history of a transverse colon mass found by me last year.  The patient had an EGD and colonoscopy done for anemia and was found to have multiple polyps in the transverse colon mass.  The patient is now presently followed by hematology/oncology.  The patient appears to have had surgery by Dr. Dahlia Byes.  Since most recent hemoglobin showed it to be slightly decreased at 2.4.  It appears that his ferritin and iron studies at that time were normal as was his CEA.  Current Outpatient Medications  Medication Sig Dispense Refill  . allopurinol (ZYLOPRIM) 300 MG tablet Take 1 tablet (300 mg total) by mouth daily. 90 tablet 1  . Cyanocobalamin (VITAMIN B-12 PO) Take 1 tablet by mouth daily.    Marland Kitchen glipiZIDE (GLIPIZIDE XL) 10 MG 24 hr tablet Take 1 tablet (10 mg total) by mouth 2 (two) times daily. 180 tablet 1  . glucose blood (ACCU-CHEK COMPACT PLUS) test strip Check blood sugars three times a day as desired Dx:E11.29 LON:88 300 each 1  . insulin NPH Human (NOVOLIN N RELION) 100 UNIT/ML injection Inject 0.35 mLs (35 Units total) into the skin daily before breakfast. 10 mL 0  . Insulin Pen Needle 31G X 6 MM MISC Use to inject insulin subcutaneously once a day in the morning 100 each 0  . metFORMIN (GLUCOPHAGE) 1000 MG tablet Take 1 tablet (1,000 mg total) by mouth 2 (two) times daily with a meal. 180 tablet 1  . nateglinide (STARLIX) 60 MG tablet Take 1 tablet (60 mg total) by mouth 3 (three) times daily with meals. 270 tablet 1  . pantoprazole (PROTONIX) 20 MG tablet Take 1 tablet (20 mg total) by mouth daily. 90 tablet 1  . quinapril (ACCUPRIL) 5 MG tablet Take 1 tablet (5 mg total) by mouth daily. 90 tablet 1  . rosuvastatin (CRESTOR) 10 MG tablet Take 1 tablet (10 mg total) by mouth at  bedtime. 90 tablet 1  . saw palmetto 160 MG capsule Take 160 mg 2 (two) times daily by mouth.    . tamsulosin (FLOMAX) 0.4 MG CAPS capsule Take 0.4 mg by mouth daily.    Marland Kitchen lidocaine-prilocaine (EMLA) cream Apply to affected area once (Patient not taking: Reported on 01/28/2018) 30 g 3  . loratadine (CLARITIN) 5 MG chewable tablet Chew 5 mg by mouth daily.    . magnesium oxide (MAG-OX) 400 MG tablet Take 1 tablet (400 mg total) by mouth daily. (Patient not taking: Reported on 01/28/2018) 90 tablet 1   No current facility-administered medications for this visit.    Facility-Administered Medications Ordered in Other Visits  Medication Dose Route Frequency Provider Last Rate Last Dose  . heparin lock flush 100 unit/mL  500 Units Intravenous Once Donald Asal, MD        Allergies as of 02/09/2018  . (No Known Allergies)    ROS:  General: Negative for anorexia, weight loss, fever, chills, fatigue, weakness. ENT: Negative for hoarseness, difficulty swallowing , nasal congestion. CV: Negative for chest pain, angina, palpitations, dyspnea on exertion, peripheral edema.  Respiratory: Negative for dyspnea at rest, dyspnea on exertion, cough, sputum, wheezing.  GI: See history of present illness. GU:  Negative for dysuria, hematuria, urinary incontinence, urinary frequency, nocturnal  urination.  Endo: Negative for unusual weight change.    Physical Examination:   BP 127/63   Pulse 87   Ht 5\' 11"  (1.803 m)   Wt 285 lb (129.3 kg)   BMI 39.75 kg/m   General: Well-nourished, well-developed in no acute distress.  Eyes: No icterus. Conjunctivae pink. Mouth: Oropharyngeal mucosa moist and pink , no lesions erythema or exudate. Lungs: Clear to auscultation bilaterally. Non-labored. Heart: Regular rate and rhythm, no murmurs rubs or gallops.  Abdomen: Bowel sounds are normal, nontender, nondistended, no hepatosplenomegaly or masses, no abdominal bruits or hernia , no rebound or guarding.    Extremities: No lower extremity edema. No clubbing or deformities. Neuro: Alert and oriented x 3.  Grossly intact. Skin: Warm and dry, no jaundice.   Psych: Alert and cooperative, normal mood and affect.  Labs:    Imaging Studies: No results found.  Assessment and Plan:   Donald Faulcon. is a 72 y.o. 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  The patient has gone through chemotherapy.  The patient had multiple polyps at his colonoscopy over a year ago.  The patient will be set up for repeat colonoscopy. I have discussed risks & benefits which include, but are not limited to, bleeding, infection, perforation & drug reaction.  The patient agrees with this plan & written consent will be obtained.       Lucilla Lame, MD. Marval Regal   Note: This dictation was prepared with Dragon dictation along with smaller phrase technology. Any transcriptional errors that result from this process are unintentional.

## 2018-02-14 NOTE — Telephone Encounter (Signed)
Pt has been advised to take 1/2 dose of his insulin as directed by his instruction sheet.

## 2018-02-15 ENCOUNTER — Ambulatory Visit
Admission: RE | Admit: 2018-02-15 | Discharge: 2018-02-15 | Disposition: A | Discharge status: Home / Self Care | Payer: Medicare Other | Source: Ambulatory Visit | Attending: Gastroenterology | Admitting: Gastroenterology

## 2018-02-15 ENCOUNTER — Encounter: Payer: Self-pay | Admitting: Anesthesiology

## 2018-02-15 ENCOUNTER — Ambulatory Visit: Payer: Medicare Other | Admitting: Anesthesiology

## 2018-02-15 ENCOUNTER — Encounter
Admission: RE | Disposition: A | Discharge status: Home / Self Care | Payer: Self-pay | Source: Ambulatory Visit | Attending: Gastroenterology

## 2018-02-15 DIAGNOSIS — Z87891 Personal history of nicotine dependence: Secondary | ICD-10-CM | POA: Insufficient documentation

## 2018-02-15 DIAGNOSIS — D123 Benign neoplasm of transverse colon: Secondary | ICD-10-CM | POA: Diagnosis not present

## 2018-02-15 DIAGNOSIS — K641 Second degree hemorrhoids: Secondary | ICD-10-CM | POA: Diagnosis not present

## 2018-02-15 DIAGNOSIS — Z6839 Body mass index (BMI) 39.0-39.9, adult: Secondary | ICD-10-CM | POA: Insufficient documentation

## 2018-02-15 DIAGNOSIS — K219 Gastro-esophageal reflux disease without esophagitis: Secondary | ICD-10-CM | POA: Insufficient documentation

## 2018-02-15 DIAGNOSIS — Z1211 Encounter for screening for malignant neoplasm of colon: Secondary | ICD-10-CM | POA: Insufficient documentation

## 2018-02-15 DIAGNOSIS — Z85038 Personal history of other malignant neoplasm of large intestine: Secondary | ICD-10-CM | POA: Insufficient documentation

## 2018-02-15 DIAGNOSIS — C184 Malignant neoplasm of transverse colon: Secondary | ICD-10-CM

## 2018-02-15 DIAGNOSIS — D649 Anemia, unspecified: Secondary | ICD-10-CM | POA: Insufficient documentation

## 2018-02-15 DIAGNOSIS — I129 Hypertensive chronic kidney disease with stage 1 through stage 4 chronic kidney disease, or unspecified chronic kidney disease: Secondary | ICD-10-CM | POA: Diagnosis not present

## 2018-02-15 DIAGNOSIS — E1122 Type 2 diabetes mellitus with diabetic chronic kidney disease: Secondary | ICD-10-CM | POA: Diagnosis not present

## 2018-02-15 DIAGNOSIS — K635 Polyp of colon: Secondary | ICD-10-CM | POA: Diagnosis not present

## 2018-02-15 HISTORY — PX: COLONOSCOPY WITH PROPOFOL: SHX5780

## 2018-02-15 LAB — GLUCOSE, CAPILLARY: Glucose-Capillary: 117 mg/dL — ABNORMAL HIGH (ref 70–99)

## 2018-02-15 SURGERY — COLONOSCOPY WITH PROPOFOL
Anesthesia: General

## 2018-02-15 MED ORDER — LIDOCAINE HCL (CARDIAC) PF 100 MG/5ML IV SOSY
PREFILLED_SYRINGE | INTRAVENOUS | Status: DC | PRN
  Administered 2018-02-15: 50 mg via INTRAVENOUS

## 2018-02-15 MED ORDER — PROPOFOL 10 MG/ML IV BOLUS
INTRAVENOUS | Status: DC | PRN
  Administered 2018-02-15: 50 mg via INTRAVENOUS
  Administered 2018-02-15: 100 mg via INTRAVENOUS

## 2018-02-15 MED ORDER — SODIUM CHLORIDE 0.9 % IV SOLN
INTRAVENOUS | Status: DC
  Administered 2018-02-15: 11:00:00 via INTRAVENOUS

## 2018-02-15 MED ORDER — PROPOFOL 500 MG/50ML IV EMUL
INTRAVENOUS | Status: AC
  Filled 2018-02-15: qty 50

## 2018-02-15 MED ORDER — LIDOCAINE HCL (PF) 1 % IJ SOLN: 2.0000 mL | Freq: Once | INTRAMUSCULAR | Status: DC

## 2018-02-15 MED ORDER — PROPOFOL 500 MG/50ML IV EMUL
INTRAVENOUS | Status: DC | PRN
  Administered 2018-02-15: 150 ug/kg/min via INTRAVENOUS

## 2018-02-15 MED ORDER — LIDOCAINE HCL (PF) 2 % IJ SOLN
INTRAMUSCULAR | Status: AC
  Filled 2018-02-15: qty 10

## 2018-02-15 NOTE — Op Note (Signed)
[facility] Gastroenterology Patient Name: Donald Mcdowell Procedure Date: 02/15/2018 11:16 AM MRN: [id number] Account #: [id number] Date of Birth: 11/03/1945 Admit Type: Outpatient Age: 72 Room: [facility] ENDO ROOM 4 Gender: Male Note Status: Finalized Procedure:            Colonoscopy Indications:          High risk colon cancer surveillance: Personal history                        of colon cancer Providers:            Lucilla Lame MD, MD Referring MD:         Arnetha Courser (Referring MD) Medicines:            Propofol per Anesthesia Complications:        No immediate complications. Procedure:            Pre-Anesthesia Assessment:                       - Prior to the procedure, a History and Physical was                        performed, and patient medications and allergies were                        reviewed. The patient's tolerance of previous                        anesthesia was also reviewed. The risks and benefits of                        the procedure and the sedation options and risks were                        discussed with the patient. All questions were                        answered, and informed consent was obtained. Prior                        Anticoagulants: The patient has taken no previous                        anticoagulant or antiplatelet agents. ASA Grade                        Assessment: III - A patient with severe systemic                        disease. After reviewing the risks and benefits, the                        patient was deemed in satisfactory condition to undergo                        the procedure.                       After obtaining informed consent, the colonoscope was  passed under direct vision. Throughout the procedure,                        the patient's blood pressure, pulse, and oxygen                        saturations were monitored continuously. The                        Colonoscope  was introduced through the anus and                        advanced to the the ileocolonic anastomosis. The                        colonoscopy was performed without difficulty. The                        patient tolerated the procedure well. The quality of                        the bowel preparation was poor. Findings:      The perianal and digital rectal examinations were normal.      A 3 mm polyp was found in the transverse colon. The polyp was sessile.       The polyp was removed with a cold snare. Resection and retrieval were       complete.      Non-bleeding internal hemorrhoids were found during retroflexion. The       hemorrhoids were Grade II (internal hemorrhoids that prolapse but reduce       spontaneously). Impression:           - Preparation of the colon was poor.                       - One 3 mm polyp in the transverse colon, removed with                        a cold snare. Resected and retrieved.                       - Non-bleeding internal hemorrhoids. Recommendation:       - Discharge patient to home.                       - Resume previous diet.                       - Continue present medications.                       - Await pathology results.                       - Repeat colonoscopy in 5 years for surveillance. Procedure Code(s):    --- Professional ---                       [phone], Colonoscopy, flexible; with removal of tumor(s),                        polyp(s), or other lesion(s) by snare  technique Diagnosis Code(s):    --- Professional ---                       [phone], Personal history of other malignant neoplasm                        of large intestine                       D12.3, Benign neoplasm of transverse colon (hepatic                        flexure or splenic flexure) CPT copyright 2018 American Medical Association. All rights reserved. The codes documented in this report are preliminary and upon coder review may  be revised to meet current  compliance requirements. Lucilla Lame MD, MD 02/15/2018 11:40:59 AM This report has been signed electronically. Number of Addenda: 0 Note Initiated On: 02/15/2018 11:16 AM Scope Withdrawal Time: 0 hours 12 minutes 14 seconds  Total Procedure Duration: 0 hours 15 minutes 36 seconds       [facility]

## 2018-02-15 NOTE — Anesthesia Postprocedure Evaluation (Signed)
Anesthesia Post Note  Patient: Donald Mcdowell.  Procedure(s) Performed: COLONOSCOPY WITH PROPOFOL (N/A )  Patient location during evaluation: Endoscopy Anesthesia Type: General Level of consciousness: awake and alert Pain management: pain level controlled Vital Signs Assessment: post-procedure vital signs reviewed and stable Respiratory status: spontaneous breathing, nonlabored ventilation, respiratory function stable and patient connected to nasal cannula oxygen Cardiovascular status: blood pressure returned to baseline and stable Postop Assessment: no apparent nausea or vomiting Anesthetic complications: no     Last Vitals:  Vitals:   02/15/18 1153 02/15/18 1203  BP: (!) 104/49 (!) 115/56  Pulse: 73 67  Resp: 16 12  Temp:    SpO2: 97% 98%    Last Pain:  Vitals:   02/15/18 1203  TempSrc:   PainSc: 0-No pain                 Martha Clan

## 2018-02-15 NOTE — Anesthesia Preprocedure Evaluation (Signed)
Anesthesia Evaluation  Patient identified by MRN, date of birth, ID band Patient awake    Reviewed: Allergy & Precautions, H&P , NPO status , Patient's Chart, lab work & pertinent test results, reviewed documented beta blocker date and time   History of Anesthesia Complications Negative for: history of anesthetic complications  Airway Mallampati: I  TM Distance: >3 FB Neck ROM: full    Dental  (+) Dental Advidsory Given, Caps, Poor Dentition   Pulmonary neg shortness of breath, neg sleep apnea, neg COPD, neg recent URI, former smoker,  Lung cancer s/p radiation          Cardiovascular Exercise Tolerance: Poor hypertension, (-) angina+ CAD  (-) Past MI, (-) Cardiac Stents and (-) CABG (-) dysrhythmias (-) Valvular Problems/Murmurs     Neuro/Psych PSYCHIATRIC DISORDERS Anxiety negative neurological ROS     GI/Hepatic Neg liver ROS, GERD  ,  Endo/Other  diabetesMorbid obesity  Renal/GU CRFRenal disease (kidney stones)  negative genitourinary   Musculoskeletal   Abdominal   Peds  Hematology  (+) Blood dyscrasia, anemia ,   Anesthesia Other Findings Past Medical History: 09/15/2016: Abdominal aortic atherosclerosis ([place])     Comment:  CT scan June 2018 No date: Allergy No date: Cancer [facility]) 09/15/2016: Coronary artery calcification seen on CAT scan     Comment:  Previous smoker; noted on CT scan June 2018 No date: Diabetes mellitus without complication (HCC) No date: GERD (gastroesophageal reflux disease) No date: Gout No date: Hyperlipidemia No date: Hypertension No date: Kidney function abnormal     Comment:  stage 2 No date: Kidney stones 08/20/2015: Obesity   Reproductive/Obstetrics negative OB ROS                             Anesthesia Physical Anesthesia Plan  ASA: III  Anesthesia Plan: General   Post-op Pain Management:    Induction: Intravenous  PONV Risk Score and Plan: 2  and Propofol infusion and TIVA  Airway Management Planned: Nasal Cannula and Natural Airway  Additional Equipment:   Intra-op Plan:   Post-operative Plan:   Informed Consent: I have reviewed the patients History and Physical, chart, labs and discussed the procedure including the risks, benefits and alternatives for the proposed anesthesia with the patient or authorized representative who has indicated his/her understanding and acceptance.   Dental Advisory Given  Plan Discussed with: Anesthesiologist, CRNA and Surgeon  Anesthesia Plan Comments:         Anesthesia Quick Evaluation

## 2018-02-15 NOTE — Anesthesia Post-op Follow-up Note (Signed)
Anesthesia QCDR form completed.        

## 2018-02-15 NOTE — Transfer of Care (Signed)
Immediate Anesthesia Transfer of Care Note  Patient: Donald Mcdowell.  Procedure(s) Performed: COLONOSCOPY WITH PROPOFOL (N/A )  Patient Location: PACU and Endoscopy Unit  Anesthesia Type:General  Level of Consciousness: awake  Airway & Oxygen Therapy: Patient Spontanous Breathing  Post-op Assessment: Report given to RN  Post vital signs: stable  Last Vitals:  Vitals Value Taken Time  BP    Temp    Pulse    Resp    SpO2      Last Pain:  Vitals:   02/15/18 1035  TempSrc: Tympanic  PainSc: 0-No pain         Complications: No apparent anesthesia complications

## 2018-02-15 NOTE — Interval H&P Note (Signed)
History and Physical Interval Note:  02/15/2018 10:12 AM  Donald Pou.  has presented today for surgery, with the diagnosis of Malignant neoplasm of the transverse colon C18.4  The various methods of treatment have been discussed with the patient and family. After consideration of risks, benefits and other options for treatment, the patient has consented to  Procedure(s): COLONOSCOPY WITH PROPOFOL (N/A) as a surgical intervention .  The patient's history has been reviewed, patient examined, no change in status, stable for surgery.  I have reviewed the patient's chart and labs.  Questions were answered to the patient's satisfaction.     Donald Mcdowell [employer]

## 2018-02-16 ENCOUNTER — Encounter: Payer: Self-pay | Admitting: Gastroenterology

## 2018-02-16 LAB — SURGICAL PATHOLOGY

## 2018-02-17 ENCOUNTER — Encounter: Payer: Self-pay | Admitting: Gastroenterology

## 2018-02-21 ENCOUNTER — Other Ambulatory Visit: Payer: Self-pay

## 2018-02-21 DIAGNOSIS — E1165 Type 2 diabetes mellitus with hyperglycemia: Principal | ICD-10-CM

## 2018-02-21 DIAGNOSIS — IMO0002 Reserved for concepts with insufficient information to code with codable children: Secondary | ICD-10-CM

## 2018-02-21 DIAGNOSIS — E1121 Type 2 diabetes mellitus with diabetic nephropathy: Secondary | ICD-10-CM

## 2018-02-21 MED ORDER — ACCU-CHEK GUIDE ME W/DEVICE KIT: 1.0000 | PACK | Freq: Three times a day (TID) | 0 refills | Status: DC

## 2018-02-22 ENCOUNTER — Other Ambulatory Visit: Payer: Self-pay

## 2018-02-22 MED ORDER — GLUCOSE BLOOD VI STRP: ORAL_STRIP | 1 refills | Status: DC

## 2018-02-22 MED ORDER — ACCU-CHEK MULTICLIX LANCETS MISC: 12 refills | Status: DC

## 2018-02-23 ENCOUNTER — Other Ambulatory Visit: Payer: Self-pay

## 2018-02-23 MED ORDER — GLUCOSE BLOOD VI STRP: ORAL_STRIP | 12 refills | Status: DC

## 2018-02-23 MED ORDER — ACCU-CHEK FASTCLIX LANCETS MISC: 3 refills | Status: AC

## 2018-02-23 NOTE — Progress Notes (Signed)
New order for correct supplies

## 2018-02-24 DIAGNOSIS — E1121 Type 2 diabetes mellitus with diabetic nephropathy: Secondary | ICD-10-CM | POA: Diagnosis not present

## 2018-03-01 ENCOUNTER — Inpatient Hospital Stay: Payer: Medicare Other | Attending: Hematology and Oncology

## 2018-03-01 DIAGNOSIS — C184 Malignant neoplasm of transverse colon: Secondary | ICD-10-CM | POA: Insufficient documentation

## 2018-03-01 DIAGNOSIS — Z452 Encounter for adjustment and management of vascular access device: Secondary | ICD-10-CM | POA: Diagnosis not present

## 2018-03-01 DIAGNOSIS — Z95828 Presence of other vascular implants and grafts: Secondary | ICD-10-CM

## 2018-03-01 MED ORDER — SODIUM CHLORIDE 0.9% FLUSH
10.0000 mL | INTRAVENOUS | Status: DC | PRN
  Administered 2018-03-01: 10 mL via INTRAVENOUS
  Filled 2018-03-01: qty 10

## 2018-03-01 MED ORDER — HEPARIN SOD (PORK) LOCK FLUSH 100 UNIT/ML IV SOLN
INTRAVENOUS | Status: AC
  Filled 2018-03-01: qty 5

## 2018-03-01 MED ORDER — HEPARIN SOD (PORK) LOCK FLUSH 100 UNIT/ML IV SOLN
500.0000 [IU] | Freq: Once | INTRAVENOUS | Status: AC
  Administered 2018-03-01: 500 [IU] via INTRAVENOUS

## 2018-03-04 DIAGNOSIS — L578 Other skin changes due to chronic exposure to nonionizing radiation: Secondary | ICD-10-CM | POA: Diagnosis not present

## 2018-03-04 DIAGNOSIS — Z1283 Encounter for screening for malignant neoplasm of skin: Secondary | ICD-10-CM | POA: Diagnosis not present

## 2018-03-04 DIAGNOSIS — L570 Actinic keratosis: Secondary | ICD-10-CM | POA: Diagnosis not present

## 2018-03-04 DIAGNOSIS — D1800 Hemangioma unspecified site: Secondary | ICD-10-CM | POA: Diagnosis not present

## 2018-03-10 ENCOUNTER — Encounter: Payer: Self-pay | Admitting: Urgent Care

## 2018-03-28 ENCOUNTER — Encounter: Payer: Self-pay | Admitting: Family Medicine

## 2018-03-28 ENCOUNTER — Encounter: Payer: Self-pay | Admitting: Urgent Care

## 2018-03-28 DIAGNOSIS — E1121 Type 2 diabetes mellitus with diabetic nephropathy: Secondary | ICD-10-CM | POA: Diagnosis not present

## 2018-04-12 ENCOUNTER — Inpatient Hospital Stay: Payer: Medicare Other | Attending: Hematology and Oncology

## 2018-04-12 DIAGNOSIS — D509 Iron deficiency anemia, unspecified: Secondary | ICD-10-CM | POA: Diagnosis not present

## 2018-04-12 DIAGNOSIS — Z85038 Personal history of other malignant neoplasm of large intestine: Secondary | ICD-10-CM | POA: Insufficient documentation

## 2018-04-12 DIAGNOSIS — Z452 Encounter for adjustment and management of vascular access device: Secondary | ICD-10-CM | POA: Diagnosis not present

## 2018-04-12 DIAGNOSIS — Z95828 Presence of other vascular implants and grafts: Secondary | ICD-10-CM

## 2018-04-12 MED ORDER — SODIUM CHLORIDE 0.9% FLUSH
10.0000 mL | Freq: Once | INTRAVENOUS | Status: AC
  Administered 2018-04-12: 10 mL via INTRAVENOUS
  Filled 2018-04-12: qty 10

## 2018-04-12 MED ORDER — HEPARIN SOD (PORK) LOCK FLUSH 100 UNIT/ML IV SOLN
INTRAVENOUS | Status: AC
  Filled 2018-04-12: qty 5

## 2018-04-12 MED ORDER — HEPARIN SOD (PORK) LOCK FLUSH 100 UNIT/ML IV SOLN
500.0000 [IU] | Freq: Once | INTRAVENOUS | Status: AC
  Administered 2018-04-12: 500 [IU] via INTRAVENOUS

## 2018-04-20 NOTE — Progress Notes (Signed)
[place] [facility] day:  04/21/2018  Chief Complaint: Donald Mcdowell. is a 73 y.o. male with squamous cell lung cancer s/p SBRT and stage IIIB colon cancer who is seen for 3 month assessment.  HPI:   The patient was last seen in the medical oncology clinic on 01/18/2018.  At that time, he was doing well overall.  He felt that his life was essentially back to normal following his chemotherapy treatments.  He had no acute symptoms, and felt well.  Neuropathy in his hands had resolved.  He continued to have diabetic neuropathy in his right foot.  Patient unsure of diabetic control, citing the fact that he did not routinely check his blood sugars.  He did note a recent up titration in his insulin dose.  Exam was grossly unremarkable.  WBC was 5300 ([place] 2800).  Hemoglobin was 12.4, hematocrit 38.4, and platelets 108,000.  Ferritin was 37.  Patient underwent repeat colonoscopy on 02/15/2018. Procedure notes reviewed. Prep was sub-optimal.  He had grade II internal hemorrhoids.There was a single 3 mm polyp in the transverse colon. Pathology consistent with tubular adenoma; no evidence of high-grade dysplasia or malignancy. Repeat colonoscopy to be performed in 5 years.   During the interim, patient has been doing well overall. He has intermittent issues with his bowels. Patient states, "I got real sick on Sunday. My bowels were bad. It just poured out of me". Neuropathy has resolved.  Blood sugars have been under better control. Patient denies bleeding; no hematochezia, melena, or gross hematuria.  He has not experienced any abdominal pain.  Patient continues to experience exertional dyspnea at times.   Patient advises that he maintains an adequate appetite. He is eating well. Weight today is 276 lb 9.1 oz (125.4 kg), which compared to his last visit to the clinic, represents a stable weight. Patient continues on oral iron BID, however he does not take it with a source  of vitamin C.   Patient denies pain in the clinic today.   Past Medical History:  Diagnosis Date  . Abdominal aortic atherosclerosis ([place]) 09/15/2016   CT scan June 2018  . Allergy   . Cancer ([place])   . Coronary artery calcification seen on CAT scan 09/15/2016   Previous smoker; noted on CT scan June 2018  . Diabetes mellitus without complication ([place])   . GERD (gastroesophageal reflux disease)   . Gout   . Hyperlipidemia   . Hypertension   . Kidney function abnormal    stage 2  . Kidney stones   . Obesity 08/20/2015    Past Surgical History:  Procedure Laterality Date  . COLONOSCOPY WITH PROPOFOL N/A 11/05/2016   Procedure: COLONOSCOPY WITH PROPOFOL;  Surgeon: Lucilla Lame, MD;  Location: [place];  Service: Gastroenterology;  Laterality: N/A;  . COLONOSCOPY WITH PROPOFOL N/A 02/15/2018   Procedure: COLONOSCOPY WITH PROPOFOL;  Surgeon: Lucilla Lame, MD;  Location: [facility] ENDOSCOPY;  Service: Endoscopy;  Laterality: N/A;  . ELECTROMAGNETIC NAVIGATION BROCHOSCOPY N/A 12/16/2016   Procedure: ELECTROMAGNETIC NAVIGATION BRONCHOSCOPY;  Surgeon: Flora Lipps, MD;  Location: [facility] ORS;  Service: Cardiopulmonary;  Laterality: N/A;  . ESOPHAGOGASTRODUODENOSCOPY (EGD) WITH PROPOFOL N/A 11/05/2016   Procedure: ESOPHAGOGASTRODUODENOSCOPY (EGD) WITH PROPOFOL;  Surgeon: Lucilla Lame, MD;  Location: [place];  Service: Gastroenterology;  Laterality: N/A;  Diabetic - oral meds  . kidney stones    . PARTIAL COLECTOMY N/A 12/28/2016   Procedure: PARTIAL COLECTOMY RIGHT;  Surgeon: Caroleen Hamman  F, MD;  Location: [facility] ORS;  Service: General;  Laterality: N/A;  . POLYPECTOMY N/A 11/05/2016   Procedure: POLYPECTOMY;  Surgeon: Lucilla Lame, MD;  Location: [place];  Service: Gastroenterology;  Laterality: N/A;  . PORTACATH PLACEMENT Right 01/18/2017   Procedure: INSERTION PORT-A-CATH;  Surgeon: Jules Husbands, MD;  Location: [facility]S;  Service: General;  Laterality: Right;     Family History  Problem Relation Age of Onset  . Alzheimer's disease Mother   . Heart disease Father   . Diabetes Father   . Cancer Father        Prostate  . Cancer Sister        skin cancer    Social History:  reports that he quit smoking about 22 months ago. His smoking use included cigarettes. He has never used smokeless tobacco. He reports that he does not drink alcohol or use drugs.  Patient worked for a [employer].  He retired in 1996.  He was in his 24s. Patient was a 1-2 pack per day smoker since he was a child.  He stopped smoking in 07/2016.  He drank alcohol in the past.  He has 3 living sisters (1 sister died).  He has no children. Patient married to [place].  Communicate with neighbor [name]  [phone]. The patient is alone today.   Allergies: No Known Allergies  Current Medications: Current Outpatient Medications  Medication Sig Dispense Refill  . ACCU-CHEK FASTCLIX LANCETS MISC Check blood sugars three times daily Dx:E11.21, LON:99 months 100 each 3  . allopurinol (ZYLOPRIM) 300 MG tablet Take 1 tablet (300 mg total) by mouth daily. 90 tablet 1  . Blood Glucose Monitoring Suppl (ACCU-CHEK GUIDE ME) w/Device KIT 1 Device by Does not apply route 3 (three) times daily. Dx:E11.21, Lon:99 1 kit 0  . Cyanocobalamin (VITAMIN B-12 PO) Take 1 tablet by mouth daily.    Marland Kitchen glipiZIDE (GLIPIZIDE XL) 10 MG 24 hr tablet Take 1 tablet (10 mg total) by mouth 2 (two) times daily. 180 tablet 1  . glucose blood (ACCU-CHEK GUIDE) test strip Check blood sugars three times daily DX:E11.21, LON:99 100 each 12  . insulin NPH Human (NOVOLIN N RELION) 100 UNIT/ML injection Inject 0.35 mLs (35 Units total) into the skin daily before breakfast. (Patient taking differently: Inject 40 Units into the skin daily before breakfast. ) 10 mL 0  . Insulin Pen Needle 31G X 6 MM MISC Use to inject insulin subcutaneously once a day in the morning 100 each 0  . Lancets (ACCU-CHEK MULTICLIX)  lancets Check fingerstick blood sugars 3x a day; E11.29, LON:99 month 100 each 12  . metFORMIN (GLUCOPHAGE) 1000 MG tablet Take 1 tablet (1,000 mg total) by mouth 2 (two) times daily with a meal. 180 tablet 1  . nateglinide (STARLIX) 60 MG tablet Take 1 tablet (60 mg total) by mouth 3 (three) times daily with meals. 270 tablet 1  . pantoprazole (PROTONIX) 20 MG tablet Take 1 tablet (20 mg total) by mouth daily. 90 tablet 1  . quinapril (ACCUPRIL) 5 MG tablet Take 1 tablet (5 mg total) by mouth daily. 90 tablet 1  . rosuvastatin (CRESTOR) 10 MG tablet Take 1 tablet (10 mg total) by mouth at bedtime. 90 tablet 1  . saw palmetto 160 MG capsule Take 540 mg by mouth 2 (two) times daily.     . tamsulosin (FLOMAX) 0.4 MG CAPS capsule Take 0.4 mg by mouth daily.    Marland Kitchen lidocaine-prilocaine (EMLA) cream  Apply to affected area once (Patient not taking: Reported on 01/28/2018) 30 g 3   No current facility-administered medications for this visit.    Facility-Administered Medications Ordered in Other Visits  Medication Dose Route Frequency Provider Last Rate Last Dose  . heparin lock flush 100 unit/mL  500 Units Intravenous Once Lequita Asal, MD        Review of Systems  Constitutional: Negative.  Negative for chills, diaphoresis, fever, malaise/fatigue and weight loss (stable).       Feels "ok".  HENT: Negative.  Negative for congestion, ear discharge, ear pain, nosebleeds, sinus pain and sore throat.   Eyes: Negative.  Negative for blurred vision, double vision, photophobia and pain.  Respiratory: Positive for shortness of breath (exertional). Negative for cough, hemoptysis and sputum production.   Cardiovascular: Negative.  Negative for chest pain, palpitations, orthopnea, leg swelling and PND.  Gastrointestinal: Negative for abdominal pain, blood in stool, constipation, diarrhea, heartburn, melena, nausea and vomiting.  Genitourinary: Negative.  Negative for dysuria, frequency, hematuria and  urgency.  Musculoskeletal: Positive for joint pain (LEFT knee). Negative for back pain, falls, myalgias and neck pain.  Skin: Negative.  Negative for itching and rash.  Neurological: Negative.  Negative for dizziness, tremors, sensory change (neuropathy, resolved), focal weakness, weakness and headaches.  Endo/Heme/Allergies: Does not bruise/bleed easily.       Diabetes.  Psychiatric/Behavioral: Positive for memory loss (mild). Negative for depression. The patient is not nervous/anxious and does not have insomnia.   All other systems reviewed and are negative.  Performance status (ECOG): 1  Vital Signs BP 109/65 (BP Location: Left Arm, Patient Position: Sitting)   Pulse 95   Temp 98.8 F (37.1 C) (Tympanic)   Resp 18   Wt 276 lb 9.1 oz (125.4 kg)   SpO2 97%   BMI 38.57 kg/m   Physical Exam  Constitutional: He is oriented to person, place, and time and well-developed, well-nourished, and in no distress. No distress.  HENT:  Head: Normocephalic and atraumatic.  Mouth/Throat: Oropharynx is clear and moist and mucous membranes are normal. No oropharyngeal exudate.  Wearing a black cap.  Gray hair.  Eyes: Pupils are equal, round, and reactive to light. Conjunctivae and EOM are normal. No scleral icterus.  Glasses.  Blue eyes.  Left amblyopia.  Neck: Normal range of motion. Neck supple. No JVD present.  Cardiovascular: Normal rate, regular rhythm, normal heart sounds and intact distal pulses. Exam reveals no gallop and no friction rub.  No murmur heard. Pulmonary/Chest: Effort normal and breath sounds normal. No respiratory distress. He has no wheezes. He has no rales.  Abdominal: Soft. Bowel sounds are normal. He exhibits no distension and no mass. There is no abdominal tenderness. There is no rebound and no guarding.  Musculoskeletal: Normal range of motion.        General: No tenderness or edema.  Lymphadenopathy:    He has no cervical adenopathy.    He has no axillary adenopathy.        Right: No supraclavicular adenopathy present.       Left: No supraclavicular adenopathy present.  Neurological: He is alert and oriented to person, place, and time.  Skin: Skin is warm. No rash noted. He is not diaphoretic. No erythema. No pallor.  Psychiatric: Mood, affect and judgment normal.  Nursing note and vitals reviewed.   Appointment on 04/21/2018  Component Date Value Ref Range Status  . Sodium 04/21/2018 135  135 - 145 mmol/L Final  . Potassium  04/21/2018 4.3  3.5 - 5.1 mmol/L Final  . Chloride 04/21/2018 103  98 - 111 mmol/L Final  . CO2 04/21/2018 21* 22 - 32 mmol/L Final  . Glucose, Bld 04/21/2018 115* 70 - 99 mg/dL Final  . BUN 04/21/2018 29* 8 - 23 mg/dL Final  . Creatinine, Ser 04/21/2018 1.23  0.61 - 1.24 mg/dL Final  . Calcium 04/21/2018 9.1  8.9 - 10.3 mg/dL Final  . Total Protein 04/21/2018 7.5  6.5 - 8.1 g/dL Final  . Albumin 04/21/2018 4.2  3.5 - 5.0 g/dL Final  . AST 04/21/2018 26  15 - 41 U/L Final  . ALT 04/21/2018 29  0 - 44 U/L Final  . Alkaline Phosphatase 04/21/2018 57  38 - 126 U/L Final  . Total Bilirubin 04/21/2018 1.0  0.3 - 1.2 mg/dL Final  . GFR calc non Af Amer 04/21/2018 59* >60 mL/min Final  . GFR calc Af Amer 04/21/2018 >60  >60 mL/min Final  . Anion gap 04/21/2018 11  5 - 15 Final   Performed at [facility], [address], [place], [place] [address]  . WBC 04/21/2018 5.9  4.0 - 10.5 K/uL Final  . RBC 04/21/2018 3.99* 4.22 - 5.81 MIL/uL Final  . Hemoglobin 04/21/2018 13.2  13.0 - 17.0 g/dL Final  . HCT 04/21/2018 39.3  39.0 - 52.0 % Final  . MCV 04/21/2018 98.5  80.0 - 100.0 fL Final  . MCH 04/21/2018 33.1  26.0 - 34.0 pg Final  . MCHC 04/21/2018 33.6  30.0 - 36.0 g/dL Final  . RDW 04/21/2018 13.6  11.5 - 15.5 % Final  . Platelets 04/21/2018 134* 150 - 400 K/uL Final  . nRBC 04/21/2018 0.0  0.0 - 0.2 % Final  . Neutrophils Relative % 04/21/2018 57  % Final  . Neutro Abs 04/21/2018 3.4  1.7 - 7.7 K/uL Final  .  Lymphocytes Relative 04/21/2018 34  % Final  . Lymphs Abs 04/21/2018 2.0  0.7 - 4.0 K/uL Final  . Monocytes Relative 04/21/2018 6  % Final  . Monocytes Absolute 04/21/2018 0.4  0.1 - 1.0 K/uL Final  . Eosinophils Relative 04/21/2018 1  % Final  . Eosinophils Absolute 04/21/2018 0.1  0.0 - 0.5 K/uL Final  . Basophils Relative 04/21/2018 1  % Final  . Basophils Absolute 04/21/2018 0.0  0.0 - 0.1 K/uL Final  . Immature Granulocytes 04/21/2018 1  % Final  . Abs Immature Granulocytes 04/21/2018 0.04  0.00 - 0.07 K/uL Final   Performed at [facility], [address], [place],  [address]    Assessment:  Wilferd Ritson. is a 72 y.o. male with stage IIIB transverse colon cancer s/p right partial colectomy on 12/28/2016.  Pathology revealed a 5.5 cm grade II invasive adenocarcinoma of the transverse colon, extending through the muscularis propria and into the adjacent adipose tissue, including the greater omentum. There was metastatic adenocarcinoma in multiple lymph nodes and tumor deposits, at least 5 involved with a total of at least 18 lymph nodes (5 of 18).  Pathologic stage was T3N2a.  CEA was 1.4 on 11/09/2016.  Abdomen and pelvic CT on 11/09/2016 revealed mid transverse colon segment of luminal narrowing with wall thickening compatible with colon cancer.  There was a single enlarged adjacent mesenteric lymph node and prominent subcentimeter lymph nodes anterior to the pancreas which may be metastatic.  There was no evidence of distant metastasis.  There was a left kidney lower pole complex cyst  with thin internal septations (Bosniak IIF).  His prostate was enlarged and extended into the floor of bladder.  There was right kidney lower pole punctate nonobstructing nephrolithiasis.  Chest CT on 11/18/2016 revealed no metastatic disease.  There was abrupt termination of the left upper lobe beyond which the distal bronchus appeared dilated with mild branching into the lung  parenchyma.  Imaging suggested a bronchocele in the medial aspect of the left upper lobe. Differential included an endobronchial polyp, endobronchial tumor, bronchial stricture or aspirated foreign body.   Electromagnetic navigational bronchoscopy (ENB) on 12/16/2016 revealed non-small cell carcinoma, favor squamous cell lung carcinoma.  Tumor was positive for p40 and negative for cdx-2 and TTF-1.  He received SBRT to the left upper lobe of 5000 cGy in 5 fractions from 02/16/2017 - 03/02/2017.  Chest CT on 07/09/2017  revealed no residual LUL pulmonary nodule following SBRT treatment. There were no new pulmonary nodules observed.Small distal paraesophageal nodes stable in appearance. Study negative for thoracic metastatic disease.   PET scan on 01/26/2017 revealed a 2.4 x 0.9 cm hypermetabolic medial left upper lobe pulmonary nodule, corresponding to known primary bronchogenic neoplasm. No findings suspicious for metastatic disease.  EGD on 11/05/2016 revealed gastritis.  Pathology confirmed chronic mild gastritis and features of a healing erosion in the stomach.  There was no H pylori, dysplasia or malignancy.  Echocardiogram  On 12/15/2016 revealed mild to moderate aortic valve stenosis. There was abnormal LV relaxation consistent with grade I diastolic dysfunction. Estimated EF was 55-60%.  He received 12 cycles of FOLFOX chemotherapy (03/15/2017 - 09/13/2017).    Chest, abdomen, and pelvic CT on 10/11/2017 revealed no evidence of recurrent or metastatic carcinoma in the chest, abdomen or pelvis.  Colonoscopy on 02/15/2018 revealed grade II internal hemorrhoids.There was a single 3 mm polyp in the transverse colon (tubular adenoma).  CEA has been followed: 1.4 on 11/09/2016, 3.9 on 10/14/2017, 2.0 on 01/18/2018, and 1.7 on 04/21/2018.  He has iron deficiency anemia.  He is on oral iron.  Ferritin was 6 on 09/04/2016.  Hematocrit was 29.7 and hemoglobin 8.8 on 09/04/2016.  Hematocrit was 28.1,  hemoglobin 9.3, and MCV 87.9 on 11/09/2016.   Ferritin has been followed: 28 on 10/13/2016, 15 on 01/14/2017, 21 on 03/29/2016, 20 on 04/12/2017, 24 on 04/26/2017, 49 on 07/12/2017,  50 on 08/02/2017, 48 on 08/16/2017, 37 on 01/18/2018, and 42 on 04/21/2018.   Symptomatically, he is doing well.  His neuropathy has resolved.  Exam is unremarkable.  Plan: 1. Labs today: CBC with diff, CMP, ferritin, iron studies, CEA. 2. Transverse colon cancer Clinically doing well. Discuss interval colonoscopy- 3 mm tubular adenoma.   Repeat colonoscopy in 5 years.  Review plan for annual CT imaging for surveillance. Review general surveillance:  every 3 months for the first year, every 4 months for years 2-3, every 6 months for years 4-5, and then annually thereafter.  3. Stage I squamous cell carcinoma of the LEFT lung Clinically doing well. No respiratory symptoms. Continue surveillance imaging. 4. Thrombocytopenia Platelet count 134,000 - improved. Etiology likely secondary to prior chemotherapy. Continue surveillance. 5. Iron deficiency Hemoglobin 13.2.  Hematocrit 39.3.  MCV 98.5. Ferritin 42.  Iron saturation 20% . Continue oral iron with OJ or vitamin C. Ferritin goal 100. 6. Continue port flushes every 6 - 8 weeks until removed. 7.   RTC in 3 months for MD assessment labs (CBC with diff, CMP, ferritin, iron studies, CEA).  I discussed the assessment and treatment plan with the  patient.  The patient was provided an opportunity to ask questions and all were answered.  The patient agreed with the plan and demonstrated an understanding of the instructions.  The patient was advised to call back or seek an in person evaluation if the symptoms worsen or if the condition fails to improve as anticipated.    [name], NP  04/21/2018, 10:21 AM   I saw and evaluated the patient, participating in the key portions of the service and reviewing pertinent diagnostic studies and records.  I reviewed the  nurse practitioner's note and agree with the findings and the plan.  The assessment and plan were discussed with the patient.  Several questions were asked by the patient and answered.   Nolon Stalls, MD 04/21/2018,10:21 AM

## 2018-04-21 ENCOUNTER — Inpatient Hospital Stay: Payer: Medicare Other | Attending: Hematology and Oncology

## 2018-04-21 ENCOUNTER — Encounter: Payer: Self-pay | Admitting: Urgent Care

## 2018-04-21 ENCOUNTER — Ambulatory Visit: Payer: Medicare Other | Admitting: Hematology and Oncology

## 2018-04-21 ENCOUNTER — Other Ambulatory Visit: Payer: Medicare Other

## 2018-04-21 ENCOUNTER — Encounter: Payer: Self-pay | Admitting: Hematology and Oncology

## 2018-04-21 ENCOUNTER — Inpatient Hospital Stay (HOSPITAL_BASED_OUTPATIENT_CLINIC_OR_DEPARTMENT_OTHER): Payer: Medicare Other | Admitting: Hematology and Oncology

## 2018-04-21 VITALS — BP 109/65 | HR 95 | Temp 98.8°F | Resp 18 | Wt 276.6 lb

## 2018-04-21 DIAGNOSIS — Z85038 Personal history of other malignant neoplasm of large intestine: Secondary | ICD-10-CM

## 2018-04-21 DIAGNOSIS — C184 Malignant neoplasm of transverse colon: Secondary | ICD-10-CM

## 2018-04-21 DIAGNOSIS — Z85118 Personal history of other malignant neoplasm of bronchus and lung: Secondary | ICD-10-CM

## 2018-04-21 DIAGNOSIS — D696 Thrombocytopenia, unspecified: Secondary | ICD-10-CM

## 2018-04-21 DIAGNOSIS — D509 Iron deficiency anemia, unspecified: Secondary | ICD-10-CM

## 2018-04-21 DIAGNOSIS — C3412 Malignant neoplasm of upper lobe, left bronchus or lung: Secondary | ICD-10-CM

## 2018-04-21 LAB — CBC WITH DIFFERENTIAL/PLATELET
Abs Immature Granulocytes: 0.04 10*3/uL (ref 0.00–0.07)
Basophils Absolute: 0 10*3/uL (ref 0.0–0.1)
Basophils Relative: 1 %
Eosinophils Absolute: 0.1 10*3/uL (ref 0.0–0.5)
Eosinophils Relative: 1 %
HCT: 39.3 % (ref 39.0–52.0)
Hemoglobin: 13.2 g/dL (ref 13.0–17.0)
Immature Granulocytes: 1 %
Lymphocytes Relative: 34 %
Lymphs Abs: 2 10*3/uL (ref 0.7–4.0)
MCH: 33.1 pg (ref 26.0–34.0)
MCHC: 33.6 g/dL (ref 30.0–36.0)
MCV: 98.5 fL (ref 80.0–100.0)
Monocytes Absolute: 0.4 10*3/uL (ref 0.1–1.0)
Monocytes Relative: 6 %
Neutro Abs: 3.4 10*3/uL (ref 1.7–7.7)
Neutrophils Relative %: 57 %
Platelets: 134 10*3/uL — ABNORMAL LOW (ref 150–400)
RBC: 3.99 MIL/uL — ABNORMAL LOW (ref 4.22–5.81)
RDW: 13.6 % (ref 11.5–15.5)
WBC: 5.9 10*3/uL (ref 4.0–10.5)
nRBC: 0 % (ref 0.0–0.2)

## 2018-04-21 LAB — IRON AND TIBC
Iron: 70 ug/dL (ref 45–182)
Saturation Ratios: 20 % (ref 17.9–39.5)
TIBC: 348 ug/dL (ref 250–450)
UIBC: 278 ug/dL

## 2018-04-21 LAB — COMPREHENSIVE METABOLIC PANEL
ALT: 29 U/L (ref 0–44)
AST: 26 U/L (ref 15–41)
Albumin: 4.2 g/dL (ref 3.5–5.0)
Alkaline Phosphatase: 57 U/L (ref 38–126)
Anion gap: 11 (ref 5–15)
BUN: 29 mg/dL — ABNORMAL HIGH (ref 8–23)
CO2: 21 mmol/L — ABNORMAL LOW (ref 22–32)
Calcium: 9.1 mg/dL (ref 8.9–10.3)
Chloride: 103 mmol/L (ref 98–111)
Creatinine, Ser: 1.23 mg/dL (ref 0.61–1.24)
GFR calc Af Amer: >60 mL/min (ref >60)
GFR calc non Af Amer: 59 mL/min — ABNORMAL LOW (ref >60)
Glucose, Bld: 115 mg/dL — ABNORMAL HIGH (ref 70–99)
Potassium: 4.3 mmol/L (ref 3.5–5.1)
Sodium: 135 mmol/L (ref 135–145)
Total Bilirubin: 1 mg/dL (ref 0.3–1.2)
Total Protein: 7.5 g/dL (ref 6.5–8.1)

## 2018-04-21 LAB — FERRITIN: Ferritin: 42 ng/mL (ref 24–336)

## 2018-04-21 NOTE — Progress Notes (Signed)
Pt here for follow up. Denies any concerns at this time.  

## 2018-04-22 LAB — CEA: CEA: 1.7 ng/mL (ref 0.0–4.7)

## 2018-04-26 ENCOUNTER — Encounter: Payer: Self-pay | Admitting: Urgent Care

## 2018-04-27 DIAGNOSIS — E1121 Type 2 diabetes mellitus with diabetic nephropathy: Secondary | ICD-10-CM | POA: Diagnosis not present

## 2018-05-03 ENCOUNTER — Ambulatory Visit: Payer: Medicare Other | Admitting: Family Medicine

## 2018-05-03 ENCOUNTER — Encounter: Payer: Self-pay | Admitting: Family Medicine

## 2018-05-03 VITALS — BP 120/60 | HR 98 | Temp 98.4°F | Resp 16 | Ht 71.0 in | Wt 281.0 lb

## 2018-05-03 DIAGNOSIS — E785 Hyperlipidemia, unspecified: Secondary | ICD-10-CM

## 2018-05-03 DIAGNOSIS — E1165 Type 2 diabetes mellitus with hyperglycemia: Secondary | ICD-10-CM | POA: Diagnosis not present

## 2018-05-03 DIAGNOSIS — I10 Essential (primary) hypertension: Secondary | ICD-10-CM | POA: Diagnosis not present

## 2018-05-03 DIAGNOSIS — C3412 Malignant neoplasm of upper lobe, left bronchus or lung: Secondary | ICD-10-CM | POA: Diagnosis not present

## 2018-05-03 DIAGNOSIS — E1121 Type 2 diabetes mellitus with diabetic nephropathy: Secondary | ICD-10-CM

## 2018-05-03 DIAGNOSIS — IMO0002 Reserved for concepts with insufficient information to code with codable children: Secondary | ICD-10-CM

## 2018-05-03 DIAGNOSIS — I700 Atherosclerosis of aorta: Secondary | ICD-10-CM

## 2018-05-03 DIAGNOSIS — N182 Chronic kidney disease, stage 2 (mild): Secondary | ICD-10-CM

## 2018-05-03 DIAGNOSIS — C182 Malignant neoplasm of ascending colon: Secondary | ICD-10-CM

## 2018-05-03 DIAGNOSIS — D509 Iron deficiency anemia, unspecified: Secondary | ICD-10-CM

## 2018-05-03 DIAGNOSIS — D696 Thrombocytopenia, unspecified: Secondary | ICD-10-CM

## 2018-05-03 DIAGNOSIS — Z8042 Family history of malignant neoplasm of prostate: Secondary | ICD-10-CM

## 2018-05-03 DIAGNOSIS — Z794 Long term (current) use of insulin: Secondary | ICD-10-CM

## 2018-05-03 NOTE — Assessment & Plan Note (Signed)
Controlled; continue meds 

## 2018-05-03 NOTE — Assessment & Plan Note (Signed)
Monitored by urologist

## 2018-05-03 NOTE — Assessment & Plan Note (Signed)
On iron therapy, monitored by oncologist

## 2018-05-03 NOTE — Patient Instructions (Addendum)
I'll suggest you cut back on the yellow part of the egg (yolk) to no more than 3 per week  Check out the information at familydoctor.org entitled "Nutrition for Weight Loss: What You Need to Know about Fad Diets" Try to lose between 1-2 pounds per week by taking in fewer calories and burning off more calories You can succeed by limiting portions, limiting foods dense in calories and fat, becoming more active, and drinking 8 glasses of water a day (64 ounces) Don't skip meals, especially breakfast, as skipping meals may alter your metabolism Do not use over-the-counter weight loss pills or gimmicks that claim rapid weight loss A healthy BMI (or body mass index) is between 18.5 and 24.9 You can calculate your ideal BMI at the NIH website ClubMonetize.fr  Try to limit saturated fats in your diet (bologna, hot dogs, barbeque, cheeseburgers, hamburgers, steak, bacon, sausage, cheese, etc.) and get more fresh fruits, vegetables, and whole grains   Preventing Unhealthy Weight Gain, Adult Staying at a healthy weight is important to your overall health. When fat builds up in your body, you may become overweight or obese. Being overweight or obese increases your risk of developing certain health problems, such as heart disease, diabetes, sleeping problems, joint problems, and some types of cancer. Unhealthy weight gain is often the result of making unhealthy food choices or not getting enough exercise. You can make changes to your lifestyle to prevent obesity and stay as healthy as possible. What nutrition changes can be made?   Eat only as much as your body needs. To do this: ? Pay attention to signs that you are hungry or full. Stop eating as soon as you feel full. ? If you feel hungry, try drinking water first before eating. Drink enough water so your urine is clear or pale yellow. ? Eat smaller portions. Pay attention to portion sizes when eating  out. ? Look at serving sizes on food labels. Most foods contain more than one serving per container. ? Eat the recommended number of calories for your gender and activity level. For most active people, a daily total of 2,000 calories is appropriate. If you are trying to lose weight or are not very active, you may need to eat fewer calories. Talk with your health care provider or a diet and nutrition specialist (dietitian) about how many calories you need each day.  Choose healthy foods, such as: ? Fruits and vegetables. At each meal, try to fill at least half of your plate with fruits and vegetables. ? Whole grains, such as whole-wheat bread, brown rice, and quinoa. ? Lean meats, such as chicken or fish. ? Other healthy proteins, such as beans, eggs, or tofu. ? Healthy fats, such as nuts, seeds, fatty fish, and olive oil. ? Low-fat or fat-free dairy products.  Check food labels, and avoid food and drinks that: ? Are high in calories. ? Have added sugar. ? Are high in sodium. ? Have saturated fats or trans fats.  Cook foods in healthier ways, such as by baking, broiling, or grilling.  Make a meal plan for the week, and shop with a grocery list to help you stay on track with your purchases. Try to avoid going to the grocery store when you are hungry.  When grocery shopping, try to shop around the outside of the store first, where the fresh foods are. Doing this helps you to avoid prepackaged foods, which can be high in sugar, salt (sodium), and fat. What lifestyle changes can  be made?   Exercise for 30 or more minutes on 5 or more days each week. Exercising may include brisk walking, yard work, biking, running, swimming, and team sports like basketball and soccer. Ask your health care provider which exercises are safe for you.  Do muscle-strengthening activities, such as lifting weights or using resistance bands, on 2 or more days a week.  Do not use any products that contain nicotine or  tobacco, such as cigarettes and e-cigarettes. If you need help quitting, ask your health care provider.  Limit alcohol intake to no more than 1 drink a day for nonpregnant women and 2 drinks a day for men. One drink equals 12 oz of beer, 5 oz of wine, or 1 oz of hard liquor.  Try to get 7-9 hours of sleep each night. What other changes can be made?  Keep a food and activity journal to keep track of: ? What you ate and how many calories you had. Remember to count the calories in sauces, dressings, and side dishes. ? Whether you were active, and what exercises you did. ? Your calorie, weight, and activity goals.  Check your weight regularly. Track any changes. If you notice you have gained weight, make changes to your diet or activity routine.  Avoid taking weight-loss medicines or supplements. Talk to your health care provider before starting any new medicine or supplement.  Talk to your health care provider before trying any new diet or exercise plan. Why are these changes important? Eating healthy, staying active, and having healthy habits can help you to prevent obesity. Those changes also:  Help you manage stress and emotions.  Help you connect with friends and family.  Improve your self-esteem.  Improve your sleep.  Prevent long-term health problems. What can happen if changes are not made? Being obese or overweight can cause you to develop joint or bone problems, which can make it hard for you to stay active or do activities you enjoy. Being obese or overweight also puts stress on your heart and lungs and can lead to health problems like diabetes, heart disease, and some cancers. Where to find more information Talk with your health care provider or a dietitian about healthy eating and healthy lifestyle choices. You may also find information from:  U.S. Department of Agriculture, MyPlate: FormerBoss.no  American Heart Association: www.heart.org  Centers for Disease  Control and Prevention: http://www.wolf.info/ Summary  Staying at a healthy weight is important to your overall health. It helps you to prevent certain diseases and health problems, such as heart disease, diabetes, joint problems, sleep disorders, and some types of cancer.  Being obese or overweight can cause you to develop joint or bone problems, which can make it hard for you to stay active or do activities you enjoy.  You can prevent unhealthy weight gain by eating a healthy diet, exercising regularly, not smoking, limiting alcohol, and getting enough sleep.  Talk with your health care provider or a dietitian for guidance about healthy eating and healthy lifestyle choices. This information is not intended to replace advice given to you by your health care provider. Make sure you discuss any questions you have with your health care provider. Document Released: 03/03/2016 Document Revised: 12/11/2016 Document Reviewed: 04/08/2016 Elsevier Interactive Patient Education  2019 [employer].

## 2018-05-03 NOTE — Assessment & Plan Note (Signed)
Try to limit saturated fats; check lipids today, goal LDL less than 70

## 2018-05-03 NOTE — Assessment & Plan Note (Signed)
Using insulin; long term as long as morbidly obese

## 2018-05-03 NOTE — Assessment & Plan Note (Signed)
Monitored by oncologist, no active bleeding

## 2018-05-03 NOTE — Progress Notes (Signed)
BP 120/60   Pulse 98   Temp 98.4 F (36.9 C) (Oral)   Resp 16   Ht 5\' 11"  (1.803 m)   Wt 281 lb (127.5 kg)   SpO2 99%   BMI 39.19 kg/m    Subjective:    Patient ID: Donald Mcdowell., male    DOB: Feb 11, 1946, 73 y.o.   MRN: [id number]  HPI: Donald Mcdowell. is a 73 y.o. male  Chief Complaint  Patient presents with  . Follow-up    HPI Patient is here for f/u [place] [facility] SCC of left lung; colon cancer Seeing oncologist in [facility] and he followed here there; Donald Loh, NP went there too; last visit on Apr 21, 2018; good appetite, eating well; weight stable; oral iron BID; next colonoscopy Dec 2024 per his note; port flushes noted; mildly low platelet count but no bleeding (134 on April 21, 2018); ferritin was 42  Type 2 diabetes; no new problems with his feet; checking sugars several times a day; this morning 163; highest in the last week is about 163 probably; lowest in the last week about 72; normal for him, no shaking; has eye exam with Dr. Thomasene Mcdowell in March  Lab Results  Component Value Date   HGBA1C 5.9 (A) 01/28/2018   HGBA1C 5.9 01/28/2018   HGBA1C 5.9 01/28/2018   HGBA1C 5.9 01/28/2018   HTN; not checking BP away from the doctor, the cuff wasn't accurate per the CMA; adds a little salt to eggs; not one to add salt to other foods  High cholesterol; red meat a couple of times a week; eats a lot cheese; eggs every day, 2 every day at least; aortic athero on previous scan  Lab Results  Component Value Date   CHOL 105 10/28/2017   HDL 39 (L) 10/28/2017   LDLCALC 44 10/28/2017   TRIG 137 10/28/2017   CHOLHDL 2.7 10/28/2017   CKD stage 2; avoiding NSAIDs; tylenol if needed  Had some noise in the right ear, gone the last few days, so not bothering him; noise x 5-7 years; does not want to see ENT, tinnitus  Hx of prostate cancer; monitored by Dr. Bernardo Heater  Depression screen [facility] 2/9 05/03/2018 01/28/2018 10/28/2017 08/24/2017 07/21/2017  Decreased Interest 0 0 0 0  0  Down, Depressed, Hopeless 0 0 0 0 0  PHQ - 2 Score 0 0 0 0 0  Altered sleeping 0 0 - - -  Tired, decreased energy 0 0 - - -  Change in appetite 0 0 - - -  Feeling bad or failure about yourself  0 0 - - -  Trouble concentrating 0 0 - - -  Moving slowly or fidgety/restless 0 0 - - -  Suicidal thoughts 0 0 - - -  PHQ-9 Score 0 0 - - -  Difficult doing work/chores Not difficult at all Not difficult at all - - -   Fall Risk  05/03/2018 01/28/2018 10/28/2017 08/24/2017 08/02/2017  Falls in the past year? 0 0 No No No  Number falls in past yr: 0 - - - -  Injury with Fall? 0 - - - -    Relevant past medical, surgical, family and social history reviewed Past Medical History:  Diagnosis Date  . Abdominal aortic atherosclerosis ([place]) 09/15/2016   CT scan June 2018  . Allergy   . Cancer ([place])   . Coronary artery calcification seen on CAT scan 09/15/2016   Previous smoker; noted on CT scan  June 2018  . Diabetes mellitus without complication ([place])   . GERD (gastroesophageal reflux disease)   . Gout   . Hyperlipidemia   . Hypertension   . Kidney function abnormal    stage 2  . Kidney stones   . Obesity 08/20/2015   Past Surgical History:  Procedure Laterality Date  . COLONOSCOPY WITH PROPOFOL N/A 11/05/2016   Procedure: COLONOSCOPY WITH PROPOFOL;  Surgeon: Donald Lame, MD;  Location: [place];  Service: Gastroenterology;  Laterality: N/A;  . COLONOSCOPY WITH PROPOFOL N/A 02/15/2018   Procedure: COLONOSCOPY WITH PROPOFOL;  Surgeon: Donald Lame, MD;  Location[facility];  Service: Endoscopy;  Laterality: N/A;  . ELECTROMAGNETIC NAVIGATION BROCHOSCOPY N/A 12/16/2016   Procedure: ELECTROMAGNETIC NAVIGATION BRONCHOSCOPY;  Surgeon: Flora Lipps, MD;  Location: [facility];  Service: Cardiopulmonary;  Laterality: N/A;  . ESOPHAGOGASTRODUODENOSCOPY (EGD) WITH PROPOFOL N/A 11/05/2016   Procedure: ESOPHAGOGASTRODUODENOSCOPY (EGD) WITH PROPOFOL;  Surgeon: Donald Lame, MD;  Location: [place];  Service: Gastroenterology;  Laterality: N/A;  Diabetic - oral meds  . kidney stones    . PARTIAL COLECTOMY N/A 12/28/2016   Procedure: PARTIAL COLECTOMY RIGHT;  Surgeon: Donald Husbands, MD;  Location: [facility];  Service: General;  Laterality: N/A;  . POLYPECTOMY N/A 11/05/2016   Procedure: POLYPECTOMY;  Surgeon: Donald Lame, MD;  Location: [place];  Service: Gastroenterology;  Laterality: N/A;  . PORTACATH PLACEMENT Right 01/18/2017   Procedure: INSERTION PORT-A-CATH;  Surgeon: Donald Husbands, MD;  Location: [facility];  Service: General;  Laterality: Right;   Family History  Problem Relation Age of Onset  . Alzheimer's disease Mother   . Heart disease Father   . Diabetes Father   . Cancer Father        Prostate  . Cancer Sister        skin cancer   Social History   Tobacco Use  . Smoking status: Former Smoker    Types: Cigarettes    Last attempt to quit: 06/2016    Years since quitting: 1.8  . Smokeless tobacco: Never Used  Substance Use Topics  . Alcohol use: No    Alcohol/week: 0.0 standard drinks  . Drug use: No     [facility] Visit from [facility]/03/2018 in [facility]  AUDIT-C Score  0      Interim medical history since last visit reviewed. Allergies and medications reviewed  Review of Systems Per HPI unless specifically indicated above     Objective:    BP 120/60   Pulse 98   Temp 98.4 F (36.9 C) (Oral)   Resp 16   Ht 5\' 11"  (1.803 m)   Wt 281 lb (127.5 kg)   SpO2 99%   BMI 39.19 kg/m   Wt Readings from Last 3 Encounters:  05/03/18 281 lb (127.5 kg)  04/21/18 276 lb 9.1 oz (125.4 kg)  02/15/18 285 lb 0.9 oz (129.3 kg)    Physical Exam Constitutional:      General: He is not in acute distress.    Appearance: He is well-developed. He is obese.  HENT:     Head: Normocephalic and atraumatic.  Eyes:     General: No scleral icterus. Neck:     Thyroid: No thyromegaly.  Cardiovascular:     Rate and Rhythm: Normal  rate and regular rhythm.  Pulmonary:     Effort: Pulmonary effort is normal.     Breath sounds: Normal breath sounds.  Abdominal:     General: Bowel  sounds are normal. There is no distension.     Palpations: Abdomen is soft.  Skin:    General: Skin is warm and dry.     Coloration: Skin is not pale.  Neurological:     Mental Status: He is alert.     Coordination: Coordination normal.  Psychiatric:        Behavior: Behavior normal.        Thought Content: Thought content normal.        Judgment: Judgment normal.    Diabetic Foot Form - Detailed   Diabetic Foot Exam - detailed Diabetic Foot exam was performed with the following findings:  Yes 05/03/2018 10:22 AM  Visual Foot Exam completed.:  Yes  Pulse Foot Exam completed.:  Yes  Right Dorsalis Pedis:  Present Left Dorsalis Pedis:  Present  Sensory Foot Exam Completed.:  Yes Semmes-Weinstein Monofilament Test R Site 1-Great Toe:  Pos L Site 1-Great Toe:  Pos        Results for orders placed or performed in visit on 04/21/18  CEA  Result Value Ref Range   CEA 1.7 0.0 - 4.7 ng/mL  Iron and TIBC  Result Value Ref Range   Iron 70 45 - 182 ug/dL   TIBC 348 250 - 450 ug/dL   Saturation Ratios 20 17.9 - 39.5 %   UIBC 278 ug/dL  Ferritin  Result Value Ref Range   Ferritin 42 24 - 336 ng/mL  Comprehensive metabolic panel  Result Value Ref Range   Sodium 135 135 - 145 mmol/L   Potassium 4.3 3.5 - 5.1 mmol/L   Chloride 103 98 - 111 mmol/L   CO2 21 (L) 22 - 32 mmol/L   Glucose, Bld 115 (H) 70 - 99 mg/dL   BUN 29 (H) 8 - 23 mg/dL   Creatinine, Ser 1.23 0.61 - 1.24 mg/dL   Calcium 9.1 8.9 - 10.3 mg/dL   Total Protein 7.5 6.5 - 8.1 g/dL   Albumin 4.2 3.5 - 5.0 g/dL   AST 26 15 - 41 U/L   ALT 29 0 - 44 U/L   Alkaline Phosphatase 57 38 - 126 U/L   Total Bilirubin 1.0 0.3 - 1.2 mg/dL   GFR calc non Af Amer 59 (L) >60 mL/min   GFR calc Af Amer >60 >60 mL/min   Anion gap 11 5 - 15  CBC with Differential  Result Value Ref  Range   WBC 5.9 4.0 - 10.5 K/uL   RBC 3.99 (L) 4.22 - 5.81 MIL/uL   Hemoglobin 13.2 13.0 - 17.0 g/dL   HCT 39.3 39.0 - 52.0 %   MCV 98.5 80.0 - 100.0 fL   MCH 33.1 26.0 - 34.0 pg   MCHC 33.6 30.0 - 36.0 g/dL   RDW 13.6 11.5 - 15.5 %   Platelets 134 (L) 150 - 400 K/uL   nRBC 0.0 0.0 - 0.2 %   Neutrophils Relative % 57 %   Neutro Abs 3.4 1.7 - 7.7 K/uL   Lymphocytes Relative 34 %   Lymphs Abs 2.0 0.7 - 4.0 K/uL   Monocytes Relative 6 %   Monocytes Absolute 0.4 0.1 - 1.0 K/uL   Eosinophils Relative 1 %   Eosinophils Absolute 0.1 0.0 - 0.5 K/uL   Basophils Relative 1 %   Basophils Absolute 0.0 0.0 - 0.1 K/uL   Immature Granulocytes 1 %   Abs Immature Granulocytes 0.04 0.00 - 0.07 K/uL      Assessment & Plan:  Problem List Items Addressed This Visit      Cardiovascular and Mediastinum   Essential hypertension, benign    Controlled; continue meds      Abdominal aortic atherosclerosis (HCC)    Try to limit saturated fats; check lipids today, goal LDL less than 70        Respiratory   Malignant neoplasm of upper lobe of left lung ([place])    Seeing cancer doctor, stage 1; continue follow-up with oncologist        Digestive   Malignant neoplasm of ascending colon ([place])    Managed by oncologist, on aspirin        Endocrine   Uncontrolled type 2 diabetes mellitus with diabetic nephropathy ([place]) - Primary   Relevant Orders   Microalbumin / creatinine urine ratio   Lipid panel   Hemoglobin A1c     Genitourinary   Chronic kidney disease (CKD), stage II (mild) (Chronic)    Limit / avoid NSAIDs; stay hydrated        Other   Thrombocytopenia ([place])    Monitored by oncologist, no active bleeding      Morbid obesity ([place])    Encouraged weight loss      Long-term insulin use (HCC)    Using insulin; long term as long as morbidly obese      Iron deficiency anemia    On iron therapy, monitored by oncologist      Relevant Medications   Ferrous Fumarate (HEMOCYTE  - 106 MG FE) 324 (106 Fe) MG TABS tablet   Hyperlipidemia LDL goal <70    Limit saturated fats, continue statin      Family history of malignant neoplasm of prostate    Monitored by urologist          Follow up plan: Return in about 4 months (around 09/01/2018) for follow-up visit with Dr. Sanda Klein (or just after).  An after-visit summary was printed and given to the patient at [place].  Please see the patient instructions which may contain other information and recommendations beyond what is mentioned above in the assessment and plan.  No orders of the defined types were placed in this encounter.   Orders Placed This Encounter  Procedures  . Microalbumin / creatinine urine ratio  . Lipid panel  . Hemoglobin A1c

## 2018-05-03 NOTE — Assessment & Plan Note (Signed)
Limit / avoid NSAIDs; stay hydrated

## 2018-05-03 NOTE — Assessment & Plan Note (Signed)
Encouraged weight loss 

## 2018-05-03 NOTE — Assessment & Plan Note (Signed)
Managed by oncologist, on aspirin

## 2018-05-03 NOTE — Assessment & Plan Note (Signed)
Limit saturated fats, continue statin

## 2018-05-03 NOTE — Assessment & Plan Note (Signed)
Seeing cancer doctor, stage 1; continue follow-up with oncologist

## 2018-05-04 ENCOUNTER — Other Ambulatory Visit: Payer: Self-pay | Admitting: Family Medicine

## 2018-05-04 LAB — LIPID PANEL
CHOLESTEROL: 83 mg/dL (ref <200)
HDL: 31 mg/dL — ABNORMAL LOW (ref >=40)
LDL Cholesterol (Calc): 29 mg/dL (calc)
Non-HDL Cholesterol (Calc): 52 mg/dL (calc) (ref <130)
Total CHOL/HDL Ratio: 2.7 (calc) (ref <5.0)
Triglycerides: 157 mg/dL — ABNORMAL HIGH (ref <150)

## 2018-05-04 LAB — HEMOGLOBIN A1C
Hgb A1c MFr Bld: 5.7 % of total Hgb — ABNORMAL HIGH (ref <5.7)
Mean Plasma Glucose: 117 (calc)
eAG (mmol/L): 6.5 (calc)

## 2018-05-04 LAB — MICROALBUMIN / CREATININE URINE RATIO
CREATININE, URINE: 81 mg/dL (ref 20–320)
MICROALB/CREAT RATIO: 10 ug/mg{creat} (ref <30)
Microalb, Ur: 0.8 mg/dL

## 2018-05-04 MED ORDER — ROSUVASTATIN CALCIUM 5 MG PO TABS: 5.0000 mg | ORAL_TABLET | Freq: Every day | ORAL | 1 refills | Status: DC

## 2018-05-16 ENCOUNTER — Telehealth: Payer: Self-pay | Admitting: Family Medicine

## 2018-05-16 NOTE — Telephone Encounter (Signed)
I left a message asking both the patient and his wife to call and schedule AWV-I with Nurse Health Advisor, [place]. VDM (DD)

## 2018-05-24 ENCOUNTER — Inpatient Hospital Stay: Payer: Medicare Other | Attending: Hematology and Oncology

## 2018-05-24 DIAGNOSIS — Z85038 Personal history of other malignant neoplasm of large intestine: Secondary | ICD-10-CM | POA: Diagnosis not present

## 2018-05-24 DIAGNOSIS — Z452 Encounter for adjustment and management of vascular access device: Secondary | ICD-10-CM | POA: Diagnosis not present

## 2018-05-24 DIAGNOSIS — Z95828 Presence of other vascular implants and grafts: Secondary | ICD-10-CM

## 2018-05-24 MED ORDER — SODIUM CHLORIDE 0.9% FLUSH
10.0000 mL | INTRAVENOUS | Status: DC | PRN
  Administered 2018-05-24: 10 mL via INTRAVENOUS
  Filled 2018-05-24: qty 10

## 2018-05-24 MED ORDER — HEPARIN SOD (PORK) LOCK FLUSH 100 UNIT/ML IV SOLN
500.0000 [IU] | Freq: Once | INTRAVENOUS | Status: AC
  Administered 2018-05-24: 500 [IU] via INTRAVENOUS

## 2018-05-27 DIAGNOSIS — E1121 Type 2 diabetes mellitus with diabetic nephropathy: Secondary | ICD-10-CM | POA: Diagnosis not present

## 2018-06-14 ENCOUNTER — Other Ambulatory Visit: Payer: Self-pay | Admitting: Family Medicine

## 2018-06-14 NOTE — Telephone Encounter (Signed)
Lab Results  Component Value Date   CREATININE 1.23 04/21/2018   Lab Results  Component Value Date   K 4.3 04/21/2018

## 2018-07-19 ENCOUNTER — Other Ambulatory Visit: Payer: Self-pay

## 2018-07-19 ENCOUNTER — Inpatient Hospital Stay: Payer: Medicare Other | Attending: Hematology and Oncology

## 2018-07-19 ENCOUNTER — Telehealth: Payer: Self-pay | Admitting: Hematology and Oncology

## 2018-07-19 DIAGNOSIS — Z9221 Personal history of antineoplastic chemotherapy: Secondary | ICD-10-CM | POA: Insufficient documentation

## 2018-07-19 DIAGNOSIS — D509 Iron deficiency anemia, unspecified: Secondary | ICD-10-CM

## 2018-07-19 DIAGNOSIS — Z79899 Other long term (current) drug therapy: Secondary | ICD-10-CM | POA: Insufficient documentation

## 2018-07-19 DIAGNOSIS — C184 Malignant neoplasm of transverse colon: Secondary | ICD-10-CM

## 2018-07-19 DIAGNOSIS — C3412 Malignant neoplasm of upper lobe, left bronchus or lung: Secondary | ICD-10-CM

## 2018-07-19 DIAGNOSIS — D696 Thrombocytopenia, unspecified: Secondary | ICD-10-CM | POA: Insufficient documentation

## 2018-07-19 DIAGNOSIS — Z85038 Personal history of other malignant neoplasm of large intestine: Secondary | ICD-10-CM | POA: Insufficient documentation

## 2018-07-19 DIAGNOSIS — Z85118 Personal history of other malignant neoplasm of bronchus and lung: Secondary | ICD-10-CM | POA: Insufficient documentation

## 2018-07-19 DIAGNOSIS — Z87891 Personal history of nicotine dependence: Secondary | ICD-10-CM | POA: Insufficient documentation

## 2018-07-19 LAB — COMPREHENSIVE METABOLIC PANEL
ALT: 26 U/L (ref 0–44)
AST: 21 U/L (ref 15–41)
Albumin: 4 g/dL (ref 3.5–5.0)
Alkaline Phosphatase: 55 U/L (ref 38–126)
Anion gap: 9 (ref 5–15)
BUN: 20 mg/dL (ref 8–23)
CO2: 23 mmol/L (ref 22–32)
Calcium: 9.2 mg/dL (ref 8.9–10.3)
Chloride: 105 mmol/L (ref 98–111)
Creatinine, Ser: 1 mg/dL (ref 0.61–1.24)
GFR calc Af Amer: >60 mL/min (ref >60)
GFR calc non Af Amer: >60 mL/min (ref >60)
Glucose, Bld: 90 mg/dL (ref 70–99)
Potassium: 4 mmol/L (ref 3.5–5.1)
Sodium: 137 mmol/L (ref 135–145)
Total Bilirubin: 0.3 mg/dL (ref 0.3–1.2)
Total Protein: 7.4 g/dL (ref 6.5–8.1)

## 2018-07-19 LAB — CBC WITH DIFFERENTIAL/PLATELET
Abs Immature Granulocytes: 0.04 10*3/uL (ref 0.00–0.07)
Basophils Absolute: 0 10*3/uL (ref 0.0–0.1)
Basophils Relative: 1 %
Eosinophils Absolute: 0.2 10*3/uL (ref 0.0–0.5)
Eosinophils Relative: 3 %
HCT: 37.9 % — ABNORMAL LOW (ref 39.0–52.0)
Hemoglobin: 12.5 g/dL — ABNORMAL LOW (ref 13.0–17.0)
Immature Granulocytes: 1 %
Lymphocytes Relative: 32 %
Lymphs Abs: 2.1 10*3/uL (ref 0.7–4.0)
MCH: 33.1 pg (ref 26.0–34.0)
MCHC: 33 g/dL (ref 30.0–36.0)
MCV: 100.3 fL — ABNORMAL HIGH (ref 80.0–100.0)
Monocytes Absolute: 0.4 10*3/uL (ref 0.1–1.0)
Monocytes Relative: 6 %
Neutro Abs: 3.9 10*3/uL (ref 1.7–7.7)
Neutrophils Relative %: 57 %
Platelets: 153 10*3/uL (ref 150–400)
RBC: 3.78 MIL/uL — ABNORMAL LOW (ref 4.22–5.81)
RDW: 13.7 % (ref 11.5–15.5)
WBC: 6.6 10*3/uL (ref 4.0–10.5)
nRBC: 0 % (ref 0.0–0.2)

## 2018-07-19 LAB — IRON AND TIBC
Iron: 62 ug/dL (ref 45–182)
Saturation Ratios: 20 % (ref 17.9–39.5)
TIBC: 316 ug/dL (ref 250–450)
UIBC: 254 ug/dL

## 2018-07-19 LAB — FERRITIN: Ferritin: 44 ng/mL (ref 24–336)

## 2018-07-19 MED ORDER — SODIUM CHLORIDE 0.9% FLUSH
10.0000 mL | INTRAVENOUS | Status: DC | PRN
  Administered 2018-07-19: 14:00:00 10 mL via INTRAVENOUS
  Filled 2018-07-19: qty 10

## 2018-07-19 MED ORDER — HEPARIN SOD (PORK) LOCK FLUSH 100 UNIT/ML IV SOLN
500.0000 [IU] | Freq: Once | INTRAVENOUS | Status: AC
  Administered 2018-07-19: 14:00:00 500 [IU] via INTRAVENOUS

## 2018-07-19 NOTE — Progress Notes (Signed)
[facility]  [address], [address] [place], [place] [address] Phone: [phone]  Fax: [phone]   Telemedicine Office Visit:  07/20/2018  Referring physician: Arnetha Courser, MD  I connected with [name] on 07/20/2018 at 11:18 AM by videoconferencing and verified that I was speaking with the correct person using 2 identifiers.  The patient was at home.  I discussed the limitations, risk, security and privacy concerns of performing an evaluation and management service by videoconferencing and the availability of in person appointments.  I also discussed with the patient that there may be a patient responsible charge related to this service.  The patient expressed understanding and agreed to proceed.  Chief Complaint: Donald Mcdowell. is a 73 y.o. male with squamous cell lung cancer s/p SBRT and stage IIIB colon cancer who is seen for 3 month assessment.  HPI: The patient was last seen in the hematology clinic on 04/21/2018.  At that time, he was doing well.  His neuropathy had resolved.  He was eating well.  Exam was unremarkable.  CBC on 07/19/2018 revealed a WBC 6600, hemoglobin 12.5, hematocrit 37.9, platelets 153,000.  Creatinine was 1.0.  Ferritin was 44 with an iron saturation of 20% and a TIBC of 316. CEA was 2.4.   During the interim, the patient has been "pretty good." He reports joint pain in his left knee. He has shortness of breath upon exertion (walking uphill). Neuropathy has resolved.   He takes oral iron 2x daily, a vitamin C tablet, and a magnesium supplement.    Past Medical History:  Diagnosis Date   Abdominal aortic atherosclerosis ([place]) 09/15/2016   CT scan June 2018   Allergy    Cancer [facility])    Coronary artery calcification seen on CAT scan 09/15/2016   Previous smoker; noted on CT scan June 2018   Diabetes mellitus without complication ([place])    GERD (gastroesophageal reflux disease)    Gout    Hyperlipidemia      Hypertension    Kidney function abnormal    stage 2   Kidney stones    Obesity 08/20/2015    Past Surgical History:  Procedure Laterality Date   COLONOSCOPY WITH PROPOFOL N/A 11/05/2016   Procedure: COLONOSCOPY WITH PROPOFOL;  Surgeon: Lucilla Lame, MD;  Location: [place];  Service: Gastroenterology;  Laterality: N/A;   COLONOSCOPY WITH PROPOFOL N/A 02/15/2018   Procedure: COLONOSCOPY WITH PROPOFOL;  Surgeon: Lucilla Lame, MD;  Location: [facility] ENDOSCOPY;  Service: Endoscopy;  Laterality: N/A;   ELECTROMAGNETIC NAVIGATION BROCHOSCOPY N/A 12/16/2016   Procedure: ELECTROMAGNETIC NAVIGATION BRONCHOSCOPY;  Surgeon: Flora Lipps, MD;  Location: [facility];  Service: Cardiopulmonary;  Laterality: N/A;   ESOPHAGOGASTRODUODENOSCOPY (EGD) WITH PROPOFOL N/A 11/05/2016   Procedure: ESOPHAGOGASTRODUODENOSCOPY (EGD) WITH PROPOFOL;  Surgeon: Lucilla Lame, MD;  Location: [place];  Service: Gastroenterology;  Laterality: N/A;  Diabetic - oral meds   kidney stones     PARTIAL COLECTOMY N/A 12/28/2016   Procedure: PARTIAL COLECTOMY RIGHT;  Surgeon: Jules Husbands, MD;  Location: [facility];  Service: General;  Laterality: N/A;   POLYPECTOMY N/A 11/05/2016   Procedure: POLYPECTOMY;  Surgeon: Lucilla Lame, MD;  Location: [place];  Service: Gastroenterology;  Laterality: N/A;   PORTACATH PLACEMENT Right 01/18/2017   Procedure: INSERTION PORT-A-CATH;  Surgeon: Jules Husbands, MD;  Location: [facility];  Service: General;  Laterality: Right;    Family History  Problem Relation Age of Onset   Alzheimer's  disease Mother    Heart disease Father    Diabetes Father    Cancer Father        Prostate   Cancer Sister        skin cancer    Social History:  reports that he quit smoking about 2 years ago. His smoking use included cigarettes. He has never used smokeless tobacco. He reports that he does not drink alcohol or use drugs. Patient worked for a [employer].   He retired in 1996.  He was in his 18s. Patient was a 1-2 pack per day smoker since he was a child.  He stopped smoking in 07/2016.  He drank alcohol in the past.  He has 3 living sisters (1 sister died).  He has no children. Patient married to [place].  Communicate with neighbor [name] [phone]. The patient is accompanied by his neighbor, [name], today.  Participants in the patient's visit and their role in the encounter included the patient, his neighbor [name], and Waymon Budge, RN today. The intake visit was provided by Waymon Budge, RN.  Allergies: No Known Allergies  Current Medications: Current Outpatient Medications  Medication Sig Dispense Refill   ACCU-CHEK FASTCLIX LANCETS MISC Check blood sugars three times daily Dx:E11.21, LON:99 months 100 each 3   allopurinol (ZYLOPRIM) 300 MG tablet Take 1 tablet (300 mg total) by mouth daily. 90 tablet 1   Blood Glucose Monitoring Suppl (ACCU-CHEK GUIDE ME) w/Device KIT 1 Device by Does not apply route 3 (three) times daily. Dx:E11.21, Lon:99 1 kit 0   Cyanocobalamin (VITAMIN B-12 PO) Take 1 tablet by mouth daily.     Ferrous Fumarate (HEMOCYTE - 106 MG FE) 324 (106 Fe) MG TABS tablet Take 1 tablet by mouth.     glipiZIDE (GLIPIZIDE XL) 10 MG 24 hr tablet Take 1 tablet (10 mg total) by mouth 2 (two) times daily. 180 tablet 1   glucose blood (ACCU-CHEK GUIDE) test strip Check blood sugars three times daily DX:E11.21, LON:99 100 each 12   insulin NPH Human (NOVOLIN N RELION) 100 UNIT/ML injection Inject 0.35 mLs (35 Units total) into the skin daily before breakfast. 10 mL 0   Insulin Pen Needle 31G X 6 MM MISC Use to inject insulin subcutaneously once a day in the morning 100 each 0   Lancets (ACCU-CHEK MULTICLIX) lancets Check fingerstick blood sugars 3x a day; E11.29, LON:99 month 100 each 12   lidocaine-prilocaine (EMLA) cream Apply to affected area once 30 g 3   metFORMIN (GLUCOPHAGE) 1000 MG tablet Take 1 tablet  (1,000 mg total) by mouth 2 (two) times daily with a meal. 180 tablet 1   nateglinide (STARLIX) 60 MG tablet Take 1 tablet (60 mg total) by mouth 3 (three) times daily with meals. 270 tablet 1   pantoprazole (PROTONIX) 20 MG tablet Take 1 tablet by mouth once daily 90 tablet 1   quinapril (ACCUPRIL) 5 MG tablet Take 1 tablet by mouth once daily 90 tablet 1   rosuvastatin (CRESTOR) 5 MG tablet Take 1 tablet (5 mg total) by mouth daily. 90 tablet 1   saw palmetto 160 MG capsule Take 540 mg by mouth 2 (two) times daily.      tamsulosin (FLOMAX) 0.4 MG CAPS capsule Take 0.4 mg by mouth daily.     No current facility-administered medications for this visit.    Facility-Administered Medications Ordered in Other Visits  Medication Dose Route Frequency Provider Last Rate Last Dose   heparin lock flush  100 unit/mL  500 Units Intravenous Once Lequita Asal, MD        Review of Systems  Constitutional: Negative.  Negative for chills, diaphoresis, fever, malaise/fatigue and weight loss (stable).       Feels "pretty good."  HENT: Negative.  Negative for congestion, ear discharge, ear pain, nosebleeds, sinus pain and sore throat.   Eyes: Negative.  Negative for blurred vision, double vision, photophobia and pain.  Respiratory: Positive for shortness of breath (exertional). Negative for cough, hemoptysis and sputum production.   Cardiovascular: Negative.  Negative for chest pain, palpitations, orthopnea, leg swelling and PND.  Gastrointestinal: Negative.  Negative for abdominal pain, blood in stool, constipation, diarrhea, heartburn, melena, nausea and vomiting.  Genitourinary: Negative.  Negative for dysuria, frequency, hematuria and urgency.  Musculoskeletal: Positive for joint pain (LEFT knee). Negative for back pain, falls, myalgias and neck pain.  Skin: Negative.  Negative for itching and rash.  Neurological: Negative for dizziness, tremors, sensory change, speech change, focal weakness,  weakness and headaches.  Endo/Heme/Allergies: Does not bruise/bleed easily.       Diabetes.  Psychiatric/Behavioral: Positive for memory loss (mild). Negative for depression. The patient is not nervous/anxious and does not have insomnia.   All other systems reviewed and are negative.  Performance status (ECOG):  1  Physical Exam  Constitutional: He is oriented to person, place, and time. He appears well-developed and well-nourished. No distress.  HENT:  Gray hair.  Male pattern baldness.  Eyes: Conjunctivae are normal. No scleral icterus.  Glasses.  Blue eyes.  Left amblyopia.  Neurological: He is alert and oriented to person, place, and time.  Skin: He is not diaphoretic.  Psychiatric: He has a normal mood and affect. His behavior is normal. Judgment and thought content normal.  Nursing note reviewed.     Imaging studies: 11/09/2016: Abdomen and pelvic CT revealed mid transverse colon segment of luminal narrowing with wall thickening compatible with colon cancer.  There was a single enlarged adjacent mesenteric lymph node and prominent subcentimeter lymph nodes anterior to the pancreas which may be metastatic.  There was no evidence of distant metastasis.  There was a left kidney lower pole complex cyst with thin internal septations (Bosniak IIF).  His prostate was enlarged and extended into the floor of bladder. There was right kidney lower pole punctate nonobstructing nephrolithiasis. 11/18/2016: Chest CT revealed no metastatic disease.  There was abrupt termination of the left upper lobe beyond which the distal bronchus appeared dilated with mild branching into the lung parenchyma.  Imaging suggested a bronchocele in the medial aspect of the left upper lobe. Differential included an endobronchial polyp, endobronchial tumor, bronchial stricture or aspirated foreign body.  01/26/2017: PET scan revealed a 2.4 x 0.9 cm hypermetabolic medial left upper lobe pulmonary nodule, corresponding to  known primary bronchogenic neoplasm. No findings suspicious for metastatic disease. 07/09/2017: Chest CT revealed no residual LUL pulmonary nodule following SBRT treatment. There were no new pulmonary nodules observed.Small distal paraesophageal nodes stable in appearance. Study negative for thoracic metastatic disease.   Infusion on 07/19/2018  Component Date Value Ref Range Status   WBC 07/19/2018 6.6  4.0 - 10.5 K/uL Final   RBC 07/19/2018 3.78* 4.22 - 5.81 MIL/uL Final   Hemoglobin 07/19/2018 12.5* 13.0 - 17.0 g/dL Final   HCT 07/19/2018 37.9* 39.0 - 52.0 % Final   MCV 07/19/2018 100.3* 80.0 - 100.0 fL Final   MCH 07/19/2018 33.1  26.0 - 34.0 pg Final   MCHC 07/19/2018  33.0  30.0 - 36.0 g/dL Final   RDW 07/19/2018 13.7  11.5 - 15.5 % Final   Platelets 07/19/2018 153  150 - 400 K/uL Final   nRBC 07/19/2018 0.0  0.0 - 0.2 % Final   Neutrophils Relative % 07/19/2018 57  % Final   Neutro Abs 07/19/2018 3.9  1.7 - 7.7 K/uL Final   Lymphocytes Relative 07/19/2018 32  % Final   Lymphs Abs 07/19/2018 2.1  0.7 - 4.0 K/uL Final   Monocytes Relative 07/19/2018 6  % Final   Monocytes Absolute 07/19/2018 0.4  0.1 - 1.0 K/uL Final   Eosinophils Relative 07/19/2018 3  % Final   Eosinophils Absolute 07/19/2018 0.2  0.0 - 0.5 K/uL Final   Basophils Relative 07/19/2018 1  % Final   Basophils Absolute 07/19/2018 0.0  0.0 - 0.1 K/uL Final   Immature Granulocytes 07/19/2018 1  % Final   Abs Immature Granulocytes 07/19/2018 0.04  0.00 - 0.07 K/uL Final   Performed at [facility], [address], [place], [place] [address]   Sodium 07/19/2018 137  135 - 145 mmol/L Final   Potassium 07/19/2018 4.0  3.5 - 5.1 mmol/L Final   Chloride 07/19/2018 105  98 - 111 mmol/L Final   CO2 07/19/2018 23  22 - 32 mmol/L Final   Glucose, Bld 07/19/2018 90  70 - 99 mg/dL Final   BUN 07/19/2018 20  8 - 23 mg/dL Final   Creatinine, Ser 07/19/2018 1.00  0.61 - 1.24 mg/dL Final    Calcium 07/19/2018 9.2  8.9 - 10.3 mg/dL Final   Total Protein 07/19/2018 7.4  6.5 - 8.1 g/dL Final   Albumin 07/19/2018 4.0  3.5 - 5.0 g/dL Final   AST 07/19/2018 21  15 - 41 U/L Final   ALT 07/19/2018 26  0 - 44 U/L Final   Alkaline Phosphatase 07/19/2018 55  38 - 126 U/L Final   Total Bilirubin 07/19/2018 0.3  0.3 - 1.2 mg/dL Final   GFR calc non Af Amer 07/19/2018 >60  >60 mL/min Final   GFR calc Af Amer 07/19/2018 >60  >60 mL/min Final   Anion gap 07/19/2018 9  5 - 15 Final   Performed at [facility], [address] [place], [place] [address]   CEA 07/19/2018 2.4  0.0 - 4.7 ng/mL Final   Comment: (NOTE)                             Nonsmokers          <3.9                             Smokers             <5.6 [employer] Electrochemiluminescence Immunoassay (ECLIA) Values obtained with different assay methods or kits cannot be used interchangeably.  Results cannot be interpreted as absolute evidence of the presence or absence of malignant disease. Performed At: [facility] [place], [place] [id number] Rush Farmer MD [id number]    Iron 07/19/2018 62  45 - 182 ug/dL Final   TIBC 07/19/2018 316  250 - 450 ug/dL Final   Saturation Ratios 07/19/2018 20  17.9 - 39.5 % Final   UIBC 07/19/2018 254  ug/dL Final   Performed at [facility], [address], [place], [place] [address]   Ferritin 07/19/2018 44  24 - 336 ng/mL Final   Performed at [facility], [address] [place], [place] [address]    Assessment:  Donald Mcdowell. is a 73 y.o. male with stage IIIB transverse colon cancer s/p right partial colectomy on 12/28/2016.  Pathology revealed a 5.5 cm grade II invasive adenocarcinoma of the transverse colon, extending through the muscularis propria and into the adjacent adipose tissue, including the greater omentum. There was metastatic adenocarcinoma in multiple lymph nodes and tumor deposits,  at least 5 involved with a total of at least 18 lymph nodes (5 of 18).  Pathologic stage was T3N2a.  CEA was 1.4 on 11/09/2016.  Abdomen and pelvic CT on 11/09/2016 revealed mid transverse colon segment of luminal narrowing with wall thickening compatible with colon cancer.  There was a single enlarged adjacent mesenteric lymph node and prominent subcentimeter lymph nodes anterior to the pancreas which may be metastatic.  There was no evidence of distant metastasis.  There was a left kidney lower pole complex cyst with thin internal septations (Bosniak IIF).  His prostate was enlarged and extended into the floor of bladder.  There was right kidney lower pole punctate nonobstructing nephrolithiasis.  Chest CT on 11/18/2016 revealed no metastatic disease.  There was abrupt termination of the left upper lobe beyond which the distal bronchus appeared dilated with mild branching into the lung parenchyma.  Imaging suggested a bronchocele in the medial aspect of the left upper lobe. Differential included an endobronchial polyp, endobronchial tumor, bronchial stricture or aspirated foreign body.   Electromagnetic navigational bronchoscopy (ENB) on 12/16/2016 revealed non-small cell carcinoma, favor squamous cell lung carcinoma.  Tumor was positive for p40 and negative for cdx-2 and TTF-1.  He received SBRT to the left upper lobe of 5000 cGy in 5 fractions from 02/16/2017 - 03/02/2017.  Chest CT on 07/09/2017  revealed no residual LUL pulmonary nodule following SBRT treatment. There were no new pulmonary nodules observed.Small distal paraesophageal nodes stable in appearance. Study negative for thoracic metastatic disease.   PET scan on 01/26/2017 revealed a 2.4 x 0.9 cm hypermetabolic medial left upper lobe pulmonary nodule, corresponding to known primary bronchogenic neoplasm. No findings suspicious for metastatic disease.  EGD on 11/05/2016 revealed gastritis.  Pathology confirmed chronic mild gastritis  and features of a healing erosion in the stomach.  There was no H pylori, dysplasia or malignancy.  Echocardiogram  On 12/15/2016 revealed mild to moderate aortic valve stenosis. There was abnormal LV relaxation consistent with grade I diastolic dysfunction. Estimated EF was 55-60%.  He received 12 cycles of FOLFOX chemotherapy (03/15/2017 - 09/13/2017).    Chest, abdomen, and pelvic CT on 10/11/2017 revealed no evidence of recurrent or metastatic carcinoma in the chest, abdomen or pelvis.  Colonoscopy on 02/15/2018 revealed grade II internal hemorrhoids.There was a single 3 mm polyp in the transverse colon (tubular adenoma).  CEA has been followed: 1.4 on 11/09/2016, 3.9 on 10/14/2017, 2.0 on 01/18/2018, 1.7 on 04/21/2018, 2.4 on 07/19/2018.   He has iron deficiency anemia.  He is on oral iron.  Ferritin was 6 on 09/04/2016.  Hematocrit was 29.7 and hemoglobin 8.8 on 09/04/2016.  Hematocrit was 28.1, hemoglobin 9.3, and MCV 87.9 on 11/09/2016.   Ferritin has been followed: 28 on 10/13/2016, 15 on 01/14/2017, 21 on 03/29/2016, 20 on 04/12/2017, 24 on 04/26/2017, 49 on 07/12/2017,  50 on 08/02/2017, 48 on 08/16/2017, 37 on 01/18/2018, 42 on 04/21/2018, and 44 on 07/19/2018.   Symptomatically, he is doing well.  He  denies any abdominal symptoms.  He denies any respiratory symptoms.  Platelets are 153,000.  Plan: 1. Review labs from 07/19/2018. 2. Stage IIIB transverse colon cancer Clinically he continues to do well.  Next colonoscopy due in 2024.  Schedule annual CT imaging for surveillance. Continue general surveillance:  every 3 months for the first year, every 4 months for years 2-3, every 6 months for years 4-5, and then annually thereafter.  3. Stage I squamous cell carcinoma of the LEFT lung Clinically he is doing well. He denies any respiratory symptoms. Continue surveillance imaging (coordinate with scan for colon cancer follow-up). 4. Thrombocytopenia, resolved Platelet  count 534,000 - normal. Low platelet count was likely secondary to prior chemotherapy. 5. Iron deficiency Hemoglobin 12.5.  Hematocrit 37.9.  MCV 100.3. Ferritin 44.  Iron saturation 20% . Patient to continue oral iron with OJ or vitamin C. Ferritin goal 100. 6. Continue port flushes every 12 weeks during COVID-19 pandemic. 7.   Labs prior to chest CT: CBC with diff, CMP, CEA, Mg. 8.   Chest, abdomen, and pelvic CT on 09/19/2018. 9.   RTC 2 days after chest CT for MD assessment, review of labs and CT scan.  I discussed the assessment and treatment plan with the patient.  The patient was provided an opportunity to ask questions and all were answered.  The patient agreed with the plan and demonstrated an understanding of the instructions.  The patient was advised to call back or seek an in person evaluation if the symptoms worsen or if the condition fails to improve as anticipated.  I provided 32 minutes (11:18 AM - 11:50 AM) of face-to-face video visit time during this this encounter and > 50% was spent counseling as documented under my assessment and plan.  I provided these services from the [facility] office.  Anishka Bushard C. Mike Gip, MD, PhD 07/20/2018, 11:18 AM  I, Molly Dorshimer, am acting as Education administrator for [employer]. Mike Gip, MD, PhD.  I, Demichael Traum C. Mike Gip, MD, have reviewed the above documentation for accuracy and completeness, and I agree with the above.

## 2018-07-20 ENCOUNTER — Other Ambulatory Visit: Payer: Medicare Other

## 2018-07-20 ENCOUNTER — Inpatient Hospital Stay (HOSPITAL_BASED_OUTPATIENT_CLINIC_OR_DEPARTMENT_OTHER): Payer: Medicare Other | Admitting: Hematology and Oncology

## 2018-07-20 ENCOUNTER — Encounter: Payer: Self-pay | Admitting: Hematology and Oncology

## 2018-07-20 DIAGNOSIS — C184 Malignant neoplasm of transverse colon: Secondary | ICD-10-CM

## 2018-07-20 DIAGNOSIS — C182 Malignant neoplasm of ascending colon: Secondary | ICD-10-CM | POA: Diagnosis not present

## 2018-07-20 DIAGNOSIS — D509 Iron deficiency anemia, unspecified: Secondary | ICD-10-CM

## 2018-07-20 DIAGNOSIS — C3412 Malignant neoplasm of upper lobe, left bronchus or lung: Secondary | ICD-10-CM

## 2018-07-20 LAB — CEA: CEA: 2.4 ng/mL (ref 0.0–4.7)

## 2018-07-20 NOTE — Progress Notes (Signed)
Confirmed name, DOB, and address. Reports increase in SOB with exertion (primarily walking up hill, etc) Denies SOB with walking around home. Denies any other concerns.

## 2018-07-21 ENCOUNTER — Other Ambulatory Visit: Payer: Self-pay | Admitting: Family Medicine

## 2018-07-26 ENCOUNTER — Other Ambulatory Visit: Payer: Self-pay | Admitting: Family Medicine

## 2018-08-11 ENCOUNTER — Other Ambulatory Visit: Payer: Self-pay | Admitting: Urology

## 2018-08-11 MED ORDER — TAMSULOSIN HCL 0.4 MG PO CAPS: 0.4000 mg | ORAL_CAPSULE | Freq: Every day | ORAL | 3 refills | Status: DC

## 2018-08-11 MED ORDER — TAMSULOSIN HCL 0.4 MG PO CAPS: 0.4000 mg | ORAL_CAPSULE | Freq: Every day | ORAL | 6 refills | Status: DC

## 2018-08-11 NOTE — Addendum Note (Signed)
Addended by: Verlene Mayer A on: 08/11/2018 09:52 AM   Modules accepted: Orders

## 2018-08-11 NOTE — Telephone Encounter (Signed)
Patient notified refill sent in as requested

## 2018-08-11 NOTE — Telephone Encounter (Signed)
Pt needs refill for Tamsulosin 0.4mg / He would like a 90 day supply

## 2018-08-16 DIAGNOSIS — E119 Type 2 diabetes mellitus without complications: Secondary | ICD-10-CM | POA: Diagnosis not present

## 2018-08-16 LAB — HM DIABETES EYE EXAM

## 2018-09-01 ENCOUNTER — Encounter: Payer: Self-pay | Admitting: Nurse Practitioner

## 2018-09-01 ENCOUNTER — Ambulatory Visit (INDEPENDENT_AMBULATORY_CARE_PROVIDER_SITE_OTHER): Payer: Medicare Other | Admitting: Nurse Practitioner

## 2018-09-01 ENCOUNTER — Other Ambulatory Visit: Payer: Self-pay

## 2018-09-01 DIAGNOSIS — I700 Atherosclerosis of aorta: Secondary | ICD-10-CM | POA: Diagnosis not present

## 2018-09-01 DIAGNOSIS — C3412 Malignant neoplasm of upper lobe, left bronchus or lung: Secondary | ICD-10-CM

## 2018-09-01 DIAGNOSIS — E1121 Type 2 diabetes mellitus with diabetic nephropathy: Secondary | ICD-10-CM | POA: Diagnosis not present

## 2018-09-01 DIAGNOSIS — K219 Gastro-esophageal reflux disease without esophagitis: Secondary | ICD-10-CM

## 2018-09-01 DIAGNOSIS — I10 Essential (primary) hypertension: Secondary | ICD-10-CM

## 2018-09-01 DIAGNOSIS — E785 Hyperlipidemia, unspecified: Secondary | ICD-10-CM

## 2018-09-01 DIAGNOSIS — M1000 Idiopathic gout, unspecified site: Secondary | ICD-10-CM

## 2018-09-01 MED ORDER — NATEGLINIDE 60 MG PO TABS: 60.0000 mg | ORAL_TABLET | Freq: Three times a day (TID) | ORAL | 0 refills | Status: DC

## 2018-09-01 MED ORDER — PANTOPRAZOLE SODIUM 20 MG PO TBEC: 20.0000 mg | DELAYED_RELEASE_TABLET | Freq: Every day | ORAL | 0 refills | Status: DC

## 2018-09-01 MED ORDER — ALLOPURINOL 300 MG PO TABS: 300.0000 mg | ORAL_TABLET | Freq: Every day | ORAL | 1 refills | Status: DC

## 2018-09-01 MED ORDER — GLIPIZIDE ER 10 MG PO TB24: 10.0000 mg | ORAL_TABLET | Freq: Every day | ORAL | 0 refills | Status: DC

## 2018-09-01 MED ORDER — ROSUVASTATIN CALCIUM 5 MG PO TABS: 5.0000 mg | ORAL_TABLET | Freq: Every day | ORAL | 1 refills | Status: DC

## 2018-09-01 MED ORDER — METFORMIN HCL 1000 MG PO TABS: 1000.0000 mg | ORAL_TABLET | Freq: Two times a day (BID) | ORAL | 1 refills | Status: DC

## 2018-09-01 MED ORDER — QUINAPRIL HCL 5 MG PO TABS: 5.0000 mg | ORAL_TABLET | Freq: Every day | ORAL | 0 refills | Status: DC

## 2018-09-01 NOTE — Progress Notes (Signed)
Virtual Visit via Telephone Note  I connected with Donald Mcdowell on 09/01/18 at  9:00 AM EDT by telephone and verified that I am speaking with the correct person using two identifiers.   Staff discussed the limitations, risks, security and privacy concerns of performing an evaluation and management service by telephone and the availability of in person appointments. Staff also discussed with the patient that there may be a patient responsible charge related to this service. The patient expressed understanding and agreed to proceed.  Patients location: home My location: home office  Other people in meeting: none    HPIs   Patient has colon cancer, lung cance Completed 40% colectomy, radiation. Has  CT ordered next month to re-evaluate spread of disease. States bowel movements are at his baseline. Denies cough, shortness of breath.   Hypertension Patient is rx quinapril 52m, his dose was cut in half due to hypotension per patient. Denies chest pain, blurry vision, lightheadedness, dizziness.  124/64  GERD Watched diet and takes PPI  Gout Takes allopurinol, last gout flare was over 20 years ago does not want to come off this medication  Hyperlipidemia Had cut his crestor 182mto 20m8mt last visit, states has been eating better as well. Denies myalgias. Does have aortic atherosclerosis.  Lab Results  Component Value Date   CHOL 83 05/03/2018   HDL 31 (L) 05/03/2018   LDLCALC 29 05/03/2018   TRIG 157 (H) 05/03/2018   CHOLHDL 2.7 05/03/2018   Diabetes Metformin 1000m71mD, starlix 60mg71mly, glipizide 10mg 620mand insulin NPH 36 units daily with breakfast. Denies hypoglycemia. States he has really been working on cutting out carbs.  Lab Results  Component Value Date   HGBA1C 5.7 (H) 05/03/2018    PHQ2/9: Depression screen PHQ 2/[facility]/18/2020 05/03/2018 01/28/2018 10/28/2017 08/24/2017  Decreased Interest 0 0 0 0 0  Down, Depressed, Hopeless 0 0 0 0 0  PHQ - 2 Score 0 0 0 0 0   Altered sleeping 0 0 0 - -  Tired, decreased energy 0 0 0 - -  Change in appetite 0 0 0 - -  Feeling bad or failure about yourself  0 0 0 - -  Trouble concentrating 0 0 0 - -  Moving slowly or fidgety/restless 0 0 0 - -  Suicidal thoughts 0 0 0 - -  PHQ-9 Score 0 0 0 - -  Difficult doing work/chores Not difficult at all Not difficult at all Not difficult at all - -  Some recent data might be hidden     PHQ reviewed. Negative  Patient Active Problem List   Diagnosis Date Noted  . Long-term insulin use (HCC) 0[place]8/2020  . Benign neoplasm of transverse colon   . Complex renal cyst 09/22/2017  . Thrombocytopenia (HCC) 0[place]0/2019  . 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 Uncontrolled type 2 diabetes mellitus with diabetic nephropathy (HCC) 0[place]1/2019  . Chemotherapy-induced thrombocytopenia 08/20/2017  . Hyperglycemia 07/18/2017  . Hypomagnesemia 06/24/2017  . Essential hypertension, benign 03/28/2017  . Encounter for antineoplastic chemotherapy 03/15/2017  . Anxiety about health 03/01/2017  . Iron deficiency anemia 01/25/2017  . Malignant neoplasm of upper lobe of left lung (HCC) 1[place]1/2018  . Goals of care, counseling/discussion 01/14/2017  . Malignant neoplasm of ascending colon (HCC) 1[place]5/2018  . Malignant neoplasm of transverse colon (HCC) 0[place]0/2018  . Lung nodule 11/23/2016  . Benign neoplasm of descending colon   . Gastritis without bleeding   .  Coronary artery calcification seen on CAT scan 09/15/2016  . Cyst, kidney, acquired 09/15/2016  . Abdominal aortic atherosclerosis ([place]) 09/15/2016  . Anemia 09/04/2016  . Encounter for medication monitoring 12/23/2015  . Hyperlipidemia LDL goal <70 12/23/2015  . Vitamin D deficiency 09/04/2015  . Morbid obesity ([place]) 08/20/2015  . Gout 08/20/2015  . Chronic kidney disease (CKD), stage II (mild) 06/16/2013  . Bladder calculi 11/21/2011  . Frank hematuria 11/21/2011  . Incomplete  bladder emptying 11/21/2011  . BPH with obstruction/lower urinary tract symptoms 11/19/2011  . Nephrolithiasis 11/19/2011  . Acalcerosis 11/19/2011  . Family history of malignant neoplasm of prostate 11/19/2011  . 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 Abdominal aortic atherosclerosis ([place]) 09/15/2016   CT scan June 2018  . Allergy   . Cancer ([place])   . Coronary artery calcification seen on CAT scan 09/15/2016   Previous smoker; noted on CT scan June 2018  . Diabetes mellitus without complication ([place])   . GERD (gastroesophageal reflux disease)   . Gout   . Hyperlipidemia   . Hypertension   . Kidney function abnormal    stage 2  . Kidney stones   . Obesity 08/20/2015    Past Surgical History:  Procedure Laterality Date  . COLONOSCOPY WITH PROPOFOL N/A 11/05/2016   Procedure: COLONOSCOPY WITH PROPOFOL;  Surgeon: Lucilla Lame, MD;  Location: [place];  Service: Gastroenterology;  Laterality: N/A;  . COLONOSCOPY WITH PROPOFOL N/A 02/15/2018   Procedure: COLONOSCOPY WITH PROPOFOL;  Surgeon: Lucilla Lame, MD;  [facility] ENDOSCOPY;  Service: Endoscopy;  Laterality: N/A;  . ELECTROMAGNETIC NAVIGATION BROCHOSCOPY N/A 12/16/2016   Procedure: ELECTROMAGNETIC NAVIGATION BRONCHOSCOPY;  Surgeon: Flora Lipps, MD;  Location: [facility] ORS;  Service: Cardiopulmonary;  Laterality: N/A;  . ESOPHAGOGASTRODUODENOSCOPY (EGD) WITH PROPOFOL N/A 11/05/2016   Procedure: ESOPHAGOGASTRODUODENOSCOPY (EGD) WITH PROPOFOL;  Surgeon: Lucilla Lame, MD;  Location: [place];  Service: Gastroenterology;  Laterality: N/A;  Diabetic - oral meds  . kidney stones    . PARTIAL COLECTOMY N/A 12/28/2016   Procedure: PARTIAL COLECTOMY RIGHT;  Surgeon: Jules Husbands, MD;  Location: [facility];  Service: General;  Laterality: N/A;  . POLYPECTOMY N/A 11/05/2016   Procedure: POLYPECTOMY;  Surgeon: Lucilla Lame, MD;  Location: [place];   Service: Gastroenterology;  Laterality: N/A;  . PORTACATH PLACEMENT Right 01/18/2017   Procedure: INSERTION PORT-A-CATH;  Surgeon: Jules Husbands, MD;  Location: [facility];  Service: General;  Laterality: Right;    Social History   Tobacco Use  . Smoking status: Former Smoker    Types: Cigarettes    Quit date: 06/2016    Years since quitting: 2.2  . Smokeless tobacco: Never Used  Substance Use Topics  . Alcohol use: No    Alcohol/week: 0.0 standard drinks     Current Outpatient Medications:  .  allopurinol (ZYLOPRIM) 300 MG tablet, Take 1 tablet (300 mg total) by mouth daily., Disp: 90 tablet, Rfl: 1 .  Cyanocobalamin (VITAMIN B-12 PO), Take 1 tablet by mouth daily., Disp: , Rfl:  .  Ferrous Fumarate (HEMOCYTE - 106 MG FE) 324 (106 Fe) MG TABS tablet, Take 1 tablet by mouth., Disp: , Rfl:  .  glipiZIDE (GLIPIZIDE XL) 10 MG 24 hr tablet, Take 1 tablet (10 mg total) by mouth daily with breakfast., Disp: 90 tablet, Rfl: 0 .  insulin NPH Human (NOVOLIN N RELION) 100 UNIT/ML injection, Inject 0.35 mLs (35 Units  total) into the skin daily before breakfast., Disp: 10 mL, Rfl: 0 .  lidocaine-prilocaine (EMLA) cream, Apply to affected area once, Disp: 30 g, Rfl: 3 .  metFORMIN (GLUCOPHAGE) 1000 MG tablet, Take 1 tablet (1,000 mg total) by mouth 2 (two) times daily with a meal., Disp: 180 tablet, Rfl: 1 .  nateglinide (STARLIX) 60 MG tablet, Take 1 tablet (60 mg total) by mouth 3 (three) times daily with meals., Disp: 270 tablet, Rfl: 0 .  pantoprazole (PROTONIX) 20 MG tablet, Take 1 tablet (20 mg total) by mouth daily., Disp: 90 tablet, Rfl: 0 .  quinapril (ACCUPRIL) 5 MG tablet, Take 1 tablet (5 mg total) by mouth daily., Disp: 90 tablet, Rfl: 0 .  rosuvastatin (CRESTOR) 5 MG tablet, Take 1 tablet (5 mg total) by mouth daily., Disp: 90 tablet, Rfl: 1 .  saw palmetto 160 MG capsule, Take 540 mg by mouth 2 (two) times daily. , Disp: , Rfl:  .  tamsulosin (FLOMAX) 0.4 MG CAPS capsule, Take 1  capsule (0.4 mg total) by mouth daily., Disp: 90 capsule, Rfl: 6 .  ACCU-CHEK FASTCLIX LANCETS MISC, Check blood sugars three times daily Dx:E11.21, LON:99 months, Disp: 100 each, Rfl: 3 .  Blood Glucose Monitoring Suppl (ACCU-CHEK GUIDE ME) w/Device KIT, 1 Device by Does not apply route 3 (three) times daily. Dx:E11.21, Lon:99, Disp: 1 kit, Rfl: 0 .  glucose blood (ACCU-CHEK GUIDE) test strip, Check blood sugars three times daily DX:E11.21, LON:99, Disp: 100 each, Rfl: 12 .  Insulin Pen Needle 31G X 6 MM MISC, Use to inject insulin subcutaneously once a day in the morning, Disp: 100 each, Rfl: 0 .  Lancets (ACCU-CHEK MULTICLIX) lancets, Check fingerstick blood sugars 3x a day; E11.29, LON:99 month, Disp: 100 each, Rfl: 12 No current facility-administered medications for this visit.   Facility-Administered Medications Ordered in Other Visits:  .  heparin lock flush 100 unit/mL, 500 Units, Intravenous, Once, [place], Melissa C, MD  No Known Allergies  ROS    No other specific complaints in a complete review of systems (except as listed in HPI above).  Objective  There were no vitals filed for this visit.   There is no height or weight on file to calculate BMI.  Patient is alert, able to speak in full sentences without difficulty.   Assessment & Plan  1. Essential hypertension, benign stable - quinapril (ACCUPRIL) 5 MG tablet; Take 1 tablet (5 mg total) by mouth daily.  Dispense: 90 tablet; Refill: 0  2. Abdominal aortic atherosclerosis (HCC)  - rosuvastatin (CRESTOR) 5 MG tablet; Take 1 tablet (5 mg total) by mouth daily.  Dispense: 90 tablet; Refill: 1  3. Malignant neoplasm of upper lobe of left lung ([place]) Follow up with oncology   4. Controlled diabetes mellitus with nephropathy ([place]) Recommend he come off glipizide, patient hesitant cautioned on geriatric warnings- will go form 75m to 10 mg - glipiZIDE (GLIPIZIDE XL) 10 MG 24 hr tablet; Take 1 tablet (10 mg total) by  mouth daily with breakfast.  Dispense: 90 tablet; Refill: 0 - metFORMIN (GLUCOPHAGE) 1000 MG tablet; Take 1 tablet (1,000 mg total) by mouth 2 (two) times daily with a meal.  Dispense: 180 tablet; Refill: 1 - nateglinide (STARLIX) 60 MG tablet; Take 1 tablet (60 mg total) by mouth 3 (three) times daily with meals.  Dispense: 270 tablet; Refill: 0  5. Hyperlipidemia LDL goal <70 Recheck levels at next visit  6. Idiopathic gout, unspecified chronicity, unspecified site Recommend coming off -  allopurinol (ZYLOPRIM) 300 MG tablet; Take 1 tablet (300 mg total) by mouth daily.  Dispense: 90 tablet; Refill: 1  7. Gastroesophageal reflux disease without esophagitis - pantoprazole (PROTONIX) 20 MG tablet; Take 1 tablet (20 mg total) by mouth daily.  Dispense: 90 tablet; Refill: 0  I discussed the assessment and treatment plan with the patient. The patient was provided an opportunity to ask questions and all were answered. The patient agreed with the plan and demonstrated an understanding of the instructions.   The patient was advised to call back or seek an in-person evaluation if the symptoms worsen or if the condition fails to improve as anticipated.  I provided 23 minutes of non-face-to-face time during this encounter.   Fredderick Severance, NP

## 2018-09-19 ENCOUNTER — Ambulatory Visit
Admission: RE | Admit: 2018-09-19 | Discharge: 2018-09-19 | Disposition: A | Discharge status: Home / Self Care | Payer: Medicare Other | Source: Ambulatory Visit | Attending: Hematology and Oncology | Admitting: Hematology and Oncology

## 2018-09-19 ENCOUNTER — Other Ambulatory Visit: Payer: Self-pay

## 2018-09-19 ENCOUNTER — Inpatient Hospital Stay: Payer: Medicare Other | Attending: Hematology and Oncology

## 2018-09-19 DIAGNOSIS — C182 Malignant neoplasm of ascending colon: Secondary | ICD-10-CM

## 2018-09-19 DIAGNOSIS — Z923 Personal history of irradiation: Secondary | ICD-10-CM | POA: Diagnosis not present

## 2018-09-19 DIAGNOSIS — C3412 Malignant neoplasm of upper lobe, left bronchus or lung: Secondary | ICD-10-CM | POA: Insufficient documentation

## 2018-09-19 DIAGNOSIS — C184 Malignant neoplasm of transverse colon: Secondary | ICD-10-CM

## 2018-09-19 DIAGNOSIS — N200 Calculus of kidney: Secondary | ICD-10-CM | POA: Diagnosis not present

## 2018-09-19 DIAGNOSIS — R590 Localized enlarged lymph nodes: Secondary | ICD-10-CM | POA: Diagnosis not present

## 2018-09-19 DIAGNOSIS — Z85118 Personal history of other malignant neoplasm of bronchus and lung: Secondary | ICD-10-CM | POA: Diagnosis not present

## 2018-09-19 DIAGNOSIS — D509 Iron deficiency anemia, unspecified: Secondary | ICD-10-CM | POA: Diagnosis not present

## 2018-09-19 LAB — COMPREHENSIVE METABOLIC PANEL
ALT: 36 U/L (ref 0–44)
AST: 31 U/L (ref 15–41)
Albumin: 4.2 g/dL (ref 3.5–5.0)
Alkaline Phosphatase: 63 U/L (ref 38–126)
Anion gap: 11 (ref 5–15)
BUN: 23 mg/dL (ref 8–23)
CO2: 22 mmol/L (ref 22–32)
Calcium: 9.6 mg/dL (ref 8.9–10.3)
Chloride: 104 mmol/L (ref 98–111)
Creatinine, Ser: 1.11 mg/dL (ref 0.61–1.24)
GFR calc Af Amer: >60 mL/min (ref >60)
GFR calc non Af Amer: >60 mL/min (ref >60)
Glucose, Bld: 85 mg/dL (ref 70–99)
Potassium: 4.4 mmol/L (ref 3.5–5.1)
Sodium: 137 mmol/L (ref 135–145)
Total Bilirubin: 0.5 mg/dL (ref 0.3–1.2)
Total Protein: 7.8 g/dL (ref 6.5–8.1)

## 2018-09-19 LAB — CBC WITH DIFFERENTIAL/PLATELET
Abs Immature Granulocytes: 0.03 10*3/uL (ref 0.00–0.07)
Basophils Absolute: 0.1 10*3/uL (ref 0.0–0.1)
Basophils Relative: 1 %
Eosinophils Absolute: 0.2 10*3/uL (ref 0.0–0.5)
Eosinophils Relative: 2 %
HCT: 39.3 % (ref 39.0–52.0)
Hemoglobin: 13.1 g/dL (ref 13.0–17.0)
Immature Granulocytes: 0 %
Lymphocytes Relative: 35 %
Lymphs Abs: 2.4 10*3/uL (ref 0.7–4.0)
MCH: 32.5 pg (ref 26.0–34.0)
MCHC: 33.3 g/dL (ref 30.0–36.0)
MCV: 97.5 fL (ref 80.0–100.0)
Monocytes Absolute: 0.3 10*3/uL (ref 0.1–1.0)
Monocytes Relative: 5 %
Neutro Abs: 3.9 10*3/uL (ref 1.7–7.7)
Neutrophils Relative %: 57 %
Platelets: 151 10*3/uL (ref 150–400)
RBC: 4.03 MIL/uL — ABNORMAL LOW (ref 4.22–5.81)
RDW: 13.7 % (ref 11.5–15.5)
WBC: 6.8 10*3/uL (ref 4.0–10.5)
nRBC: 0 % (ref 0.0–0.2)

## 2018-09-19 LAB — MAGNESIUM: Magnesium: 1.7 mg/dL (ref 1.7–2.4)

## 2018-09-19 MED ORDER — HEPARIN SOD (PORK) LOCK FLUSH 100 UNIT/ML IV SOLN
500.0000 [IU] | Freq: Once | INTRAVENOUS | Status: AC
  Administered 2018-09-19: 500 [IU] via INTRAVENOUS

## 2018-09-19 MED ORDER — SODIUM CHLORIDE 0.9% FLUSH
10.0000 mL | INTRAVENOUS | Status: DC | PRN
  Administered 2018-09-19: 10 mL via INTRAVENOUS
  Filled 2018-09-19: qty 10

## 2018-09-19 MED ORDER — IOHEXOL 300 MG/ML  SOLN
100.0000 mL | Freq: Once | INTRAMUSCULAR | Status: AC | PRN
  Administered 2018-09-19: 100 mL via INTRAVENOUS

## 2018-09-20 ENCOUNTER — Ambulatory Visit: Payer: Medicare Other

## 2018-09-20 ENCOUNTER — Other Ambulatory Visit: Payer: Medicare Other

## 2018-09-20 ENCOUNTER — Other Ambulatory Visit: Payer: Self-pay | Admitting: Hematology and Oncology

## 2018-09-20 LAB — CEA: CEA: 2.8 ng/mL (ref 0.0–4.7)

## 2018-09-20 NOTE — Progress Notes (Signed)
[facility]  [address] [place], [place] [address] Phone: [phone]  Fax: [phone]   Clinic Day:  09/22/2018  Referring physician: Arnetha Courser, MD  Chief Complaint: Donald Mcdowell. is a 73 y.o. male with squamous cell lung cancer s/p SBRT and stage IIIB colon cancer who is seen for 2 month assessment and review of interval imaging studies.  HPI: The patient was last seen in the medical oncology clinic on 07/20/2018. At that time, he was doing well.  He denied any abdominal symptoms. He denied any respiratory symptoms.  We discussed surveillance imaging.   Chest, abdomen, and pelvis CT on 09/19/2018 revealed a markedly enlarged portacaval lymph node (4.2 x 2.5 x 4.8 cm; previously 2.3 x 1.0 x 1.9 cm) concerning for metastatic adenopathy. There were changes of external beam radiation within the left upper lobe. Increased nodular soft tissue attenuation within the radiation port was nonspecific in may represent evolutionary changes of external beam radiation. Recurrent tumor would be difficult to exclude as this was a new finding from 10/11/2017.  He has kidney stones.  Lab on 09/19/2018: WBC 6,8000, hemoglobin 13.1, hematocrit 39.3, platelets 151,000, magnesium 1.7. CEA was 2.8.  During the interim, the patient states "I'm pretty good".  Left knee pain and shortness of breath have improved.  He reports being nervous about the findings on CT scan.  His weight is down 7 pounds.   Past Medical History:  Diagnosis Date   Abdominal aortic atherosclerosis ([place]) 09/15/2016   CT scan June 2018   Allergy    Cancer [facility])    Coronary artery calcification seen on CAT scan 09/15/2016   Previous smoker; noted on CT scan June 2018   Diabetes mellitus without complication ([place])    GERD (gastroesophageal reflux disease)    Gout    Hyperlipidemia    Hypertension    Kidney function abnormal    stage 2   Kidney stones    Obesity 08/20/2015      Past Surgical History:  Procedure Laterality Date   COLONOSCOPY WITH PROPOFOL N/A 11/05/2016   Procedure: COLONOSCOPY WITH PROPOFOL;  Surgeon: Lucilla Lame, MD;  Location: [place];  Service: Gastroenterology;  Laterality: N/A;   COLONOSCOPY WITH PROPOFOL N/A 02/15/2018   Procedure: COLONOSCOPY WITH PROPOFOL;  Surgeon: Lucilla Lame, MD;  Location: [facility]COPY;  Service: Endoscopy;  Laterality: N/A;   ELECTROMAGNETIC NAVIGATION BROCHOSCOPY N/A 12/16/2016   Procedure: ELECTROMAGNETIC NAVIGATION BRONCHOSCOPY;  Surgeon: Flora Lipps, MD;  Location: [facility];  Service: Cardiopulmonary;  Laterality: N/A;   ESOPHAGOGASTRODUODENOSCOPY (EGD) WITH PROPOFOL N/A 11/05/2016   Procedure: ESOPHAGOGASTRODUODENOSCOPY (EGD) WITH PROPOFOL;  Surgeon: Lucilla Lame, MD;  Location: [place];  Service: Gastroenterology;  Laterality: N/A;  Diabetic - oral meds   kidney stones     PARTIAL COLECTOMY N/A 12/28/2016   Procedure: PARTIAL COLECTOMY RIGHT;  Surgeon: Jules Husbands, MD;  Location: [facility];  Service: General;  Laterality: N/A;   POLYPECTOMY N/A 11/05/2016   Procedure: POLYPECTOMY;  Surgeon: Lucilla Lame, MD;  Location: [place];  Service: Gastroenterology;  Laterality: N/A;   PORTACATH PLACEMENT Right 01/18/2017   Procedure: INSERTION PORT-A-CATH;  Surgeon: Jules Husbands, MD;  Location: [facility];  Service: General;  Laterality: Right;    Family History  Problem Relation Age of Onset   Alzheimer's disease Mother    Heart disease Father    Diabetes Father    Cancer Father  Prostate   Cancer Sister        skin cancer    Social History:  reports that he quit smoking about 2 years ago. His smoking use included cigarettes. He has never used smokeless tobacco. He reports that he does not drink alcohol or use drugs. Patient worked for a [employer]. He retired in 1996. He was in his 34s. Patient was a 1-2 pack per day smoker since he was a child.  He stopped smoking in 07/2016. He drank alcohol in the past. He has 3 living sisters (1 sister died). He has no children. Patient married to Donald Mcdowell. Communicate with neighbor Donald Mcdowell [phone]. The patient is alone today.  Allergies: No Known Allergies  Current Medications: Current Outpatient Medications  Medication Sig Dispense Refill   ACCU-CHEK FASTCLIX LANCETS MISC Check blood sugars three times daily Dx:E11.21, LON:99 months 100 each 3   allopurinol (ZYLOPRIM) 300 MG tablet Take 1 tablet (300 mg total) by mouth daily. 90 tablet 1   Blood Glucose Monitoring Suppl (ACCU-CHEK GUIDE ME) w/Device KIT 1 Device by Does not apply route 3 (three) times daily. Dx:E11.21, Lon:99 1 kit 0   Cyanocobalamin (VITAMIN B-12 PO) Take 1 tablet by mouth daily.     Ferrous Fumarate (HEMOCYTE - 106 MG FE) 324 (106 Fe) MG TABS tablet Take 1 tablet by mouth.     glipiZIDE (GLIPIZIDE XL) 10 MG 24 hr tablet Take 1 tablet (10 mg total) by mouth daily with breakfast. 90 tablet 0   glucose blood (ACCU-CHEK GUIDE) test strip Check blood sugars three times daily DX:E11.21, LON:99 100 each 12   insulin NPH Human (NOVOLIN N RELION) 100 UNIT/ML injection Inject 0.35 mLs (35 Units total) into the skin daily before breakfast. 10 mL 0   Insulin Pen Needle 31G X 6 MM MISC Use to inject insulin subcutaneously once a day in the morning 100 each 0   Lancets (ACCU-CHEK MULTICLIX) lancets Check fingerstick blood sugars 3x a day; E11.29, LON:99 month 100 each 12   lidocaine-prilocaine (EMLA) cream Apply to affected area once 30 g 3   metFORMIN (GLUCOPHAGE) 1000 MG tablet Take 1 tablet (1,000 mg total) by mouth 2 (two) times daily with a meal. 180 tablet 1   nateglinide (STARLIX) 60 MG tablet Take 1 tablet (60 mg total) by mouth 3 (three) times daily with meals. 270 tablet 0   pantoprazole (PROTONIX) 20 MG tablet Take 1 tablet (20 mg total) by mouth daily. 90 tablet 0   quinapril (ACCUPRIL) 5 MG tablet Take  1 tablet (5 mg total) by mouth daily. 90 tablet 0   rosuvastatin (CRESTOR) 5 MG tablet Take 1 tablet (5 mg total) by mouth daily. 90 tablet 1   saw palmetto 160 MG capsule Take 540 mg by mouth 2 (two) times daily.      tamsulosin (FLOMAX) 0.4 MG CAPS capsule Take 1 capsule (0.4 mg total) by mouth daily. 90 capsule 6   No current facility-administered medications for this visit.    Facility-Administered Medications Ordered in Other Visits  Medication Dose Route Frequency Provider Last Rate Last Dose   heparin lock flush 100 unit/mL  500 Units Intravenous Once Lequita Asal, MD        Review of Systems  Constitutional: Positive for weight loss (7 pounds since 04/2018). Negative for chills, diaphoresis, fever and malaise/fatigue.       "I'm pretty good."  HENT: Negative.  Negative for congestion, ear discharge, ear pain, nosebleeds, sinus pain  and sore throat.   Eyes: Negative.  Negative for blurred vision, double vision, photophobia and pain.  Respiratory: Positive for shortness of breath (exertional, improved). Negative for cough, hemoptysis and sputum production.   Cardiovascular: Negative.  Negative for chest pain, palpitations, orthopnea, leg swelling and PND.  Gastrointestinal: Negative.  Negative for abdominal pain, blood in stool, constipation, diarrhea, heartburn, melena, nausea and vomiting.  Genitourinary: Negative.  Negative for dysuria, frequency, hematuria and urgency.  Musculoskeletal: Positive for joint pain (LEFT knee, improved). Negative for back pain, falls, myalgias and neck pain.  Skin: Negative.  Negative for itching and rash.  Neurological: Negative for dizziness, tremors, sensory change, speech change, focal weakness, weakness and headaches.  Endo/Heme/Allergies: Does not bruise/bleed easily.       Diabetes.  Psychiatric/Behavioral: Positive for memory loss (mild). Negative for depression. The patient is nervous/anxious. The patient does not have insomnia.     All other systems reviewed and are negative.  Performance status (ECOG): 1  Vital Signs Blood pressure (!) 127/52, pulse 90, temperature 97.9 F (36.6 C), temperature source Tympanic, resp. rate 18, height _0  (1.803 m), weight 274 lb 11.1 oz (124.6 kg), SpO2 100 %.  Physical Exam  Constitutional: He is oriented to person, place, and time. He appears well-developed and well-nourished. No distress.  HENT:  Head: Normocephalic and atraumatic.  Mouth/Throat: Oropharynx is clear and moist. No oropharyngeal exudate.  Gray hair.  Male pattern baldness.  Eyes: Pupils are equal, round, and reactive to light. Conjunctivae and EOM are normal. No scleral icterus.  Glasses.  Blue eyes.  Left amblyopia.  Neck: Normal range of motion. Neck supple.  Cardiovascular: Normal rate, regular rhythm and normal heart sounds.  No murmur heard. Pulmonary/Chest: Effort normal and breath sounds normal. No respiratory distress.  Abdominal: Soft. Bowel sounds are normal. There is no abdominal tenderness.  Musculoskeletal: Normal range of motion.        General: No tenderness or edema.  Lymphadenopathy:    He has no cervical adenopathy.  Neurological: He is alert and oriented to person, place, and time. He has normal reflexes.  Skin: Skin is warm and dry. He is not diaphoretic.  Psychiatric: He has a normal mood and affect. His behavior is normal. Judgment and thought content normal.  Nursing note and vitals reviewed.   Imaging studies: 11/09/2016:  Abdomen and pelvis CT revealed mid transverse colon segment of luminal narrowing with wall thickening compatible with colon cancer. There was a single enlarged adjacent mesenteric lymph node and prominent subcentimeter lymph nodes anterior to the pancreas which may be metastatic. There was no evidence of distant metastasis. There was a left kidney lower pole complex cyst with thin internal septations (Bosniak IIF). His prostate was enlarged and extended into  the floor of bladder. There was right kidney lower pole punctate nonobstructing nephrolithiasis. 11/18/2016:  Chest CTrevealed no metastatic disease. There was abrupt termination of the left upper lobe beyond which the distal bronchus appeared dilated with mild branching into the lung parenchyma. Imaging suggested a bronchocele in the medial aspect of the left upper lobe. Differential included an endobronchial polyp, endobronchial tumor, bronchial stricture or aspirated foreign body.  07/09/2017:  Chest CT revealed no residual LUL pulmonary nodule following SBRT treatment. There were no new pulmonary nodules observed.Small distal paraesophageal nodes stable in appearance. Study negative for thoracic metastatic disease.  01/26/2017:  PET scanrevealed a 2.4 x 0.9 cm hypermetabolic medial left upper lobe pulmonary nodule, corresponding to known primary bronchogenic neoplasm. No findings  suspicious for metastatic disease. 10/11/2017:  Chest, abdomen, and pelvis CTrevealed no evidence of recurrent or metastatic carcinoma in the chest, abdomen or pelvis. 09/19/2018:  Chest, abdomen, and pelvis CT revealed a markedly enlarged portacaval lymph node (4.2 x 2.5 x 4.8 cm; previously 2.3 x 1.0 x 1.9 cm) concerning for metastatic adenopathy. There were changes of external beam radiation within the left upper lobe. Increased nodular soft tissue attenuation within the radiation port was nonspecific in may represent evolutionary changes of external beam radiation. Recurrent tumor would be difficult to exclude as this was a new finding from 10/11/2017.  He has kidney stones.   No visits with results within 3 Day(s) from this visit.  Latest known visit with results is:  Infusion on 09/19/2018  Component Date Value Ref Range Status   Magnesium 09/19/2018 1.7  1.7 - 2.4 mg/dL Final   Performed at [facility], [address] [place], [name] [address]   CEA 09/19/2018 2.8  0.0 - 4.7 ng/mL Final    Comment: (NOTE)                             Nonsmokers          <3.9                             Smokers             <5.6 [employer] Electrochemiluminescence Immunoassay (ECLIA) Values obtained with different assay methods or kits cannot be used interchangeably.  Results cannot be interpreted as absolute evidence of the presence or absence of malignant disease. Performed At: [facility] [place], [place] [id number] Rush Farmer MD [id number]    Sodium 09/19/2018 137  135 - 145 mmol/L Final   Potassium 09/19/2018 4.4  3.5 - 5.1 mmol/L Final   Chloride 09/19/2018 104  98 - 111 mmol/L Final   CO2 09/19/2018 22  22 - 32 mmol/L Final   Glucose, Bld 09/19/2018 85  70 - 99 mg/dL Final   BUN 09/19/2018 23  8 - 23 mg/dL Final   Creatinine, Ser 09/19/2018 1.11  0.61 - 1.24 mg/dL Final   Calcium 09/19/2018 9.6  8.9 - 10.3 mg/dL Final   Total Protein 09/19/2018 7.8  6.5 - 8.1 g/dL Final   Albumin 09/19/2018 4.2  3.5 - 5.0 g/dL Final   AST 09/19/2018 31  15 - 41 U/L Final   ALT 09/19/2018 36  0 - 44 U/L Final   Alkaline Phosphatase 09/19/2018 63  38 - 126 U/L Final   Total Bilirubin 09/19/2018 0.5  0.3 - 1.2 mg/dL Final   GFR calc non Af Amer 09/19/2018 >60  >60 mL/min Final   GFR calc Af Amer 09/19/2018 >60  >60 mL/min Final   Anion gap 09/19/2018 11  5 - 15 Final   Performed at [name], [address] [place], [place] [address]   WBC 09/19/2018 6.8  4.0 - 10.5 K/uL Final   RBC 09/19/2018 4.03* 4.22 - 5.81 MIL/uL Final   Hemoglobin 09/19/2018 13.1  13.0 - 17.0 g/dL Final   HCT 09/19/2018 39.3  39.0 - 52.0 % Final   MCV 09/19/2018 97.5  80.0 - 100.0 fL Final   MCH 09/19/2018 32.5  26.0 - 34.0 pg Final   MCHC 09/19/2018 33.3  30.0 - 36.0 g/dL Final   RDW 09/19/2018 13.7  11.5 - 15.5 % Final   Platelets 09/19/2018 151  150 - 400 K/uL Final   nRBC 09/19/2018 0.0  0.0 - 0.2 % Final   Neutrophils Relative %  09/19/2018 57  % Final   Neutro Abs 09/19/2018 3.9  1.7 - 7.7 K/uL Final   Lymphocytes Relative 09/19/2018 35  % Final   Lymphs Abs 09/19/2018 2.4  0.7 - 4.0 K/uL Final   Monocytes Relative 09/19/2018 5  % Final   Monocytes Absolute 09/19/2018 0.3  0.1 - 1.0 K/uL Final   Eosinophils Relative 09/19/2018 2  % Final   Eosinophils Absolute 09/19/2018 0.2  0.0 - 0.5 K/uL Final   Basophils Relative 09/19/2018 1  % Final   Basophils Absolute 09/19/2018 0.1  0.0 - 0.1 K/uL Final   Immature Granulocytes 09/19/2018 0  % Final   Abs Immature Granulocytes 09/19/2018 0.03  0.00 - 0.07 K/uL Final   Performed at [facility], [address], [place], [place] [address]    Assessment:  Donald Raczkowski. is a 73 y.o. male with stage IIIB transverse colon cancers/p right partial colectomy on 12/28/2016. Pathology revealed a 5.5 cm grade II invasive adenocarcinoma of the transverse colon, extending through the muscularis propria and into the adjacent adipose tissue, including the greater omentum. There was metastatic adenocarcinoma in multiple lymph nodes and tumor deposits, at least 5 involved with a total of at least 18 lymph nodes (5 of 18). Pathologic stagewas T3N2a.CEAwas 1.4 on 11/09/2016.  Abdomen and pelvic CTon 11/09/2016 revealed mid transverse colon segment of luminal narrowing with wall thickening compatible with colon cancer. There was a single enlarged adjacent mesenteric lymph node and prominent subcentimeter lymph nodes anterior to the pancreas which may be metastatic.   Chest CTon 11/18/2016 revealed no metastatic disease. There was abrupt termination of the left upper lobe beyond which the distal bronchus appeared dilated with mild branching into the lung parenchyma. Imaging suggested a bronchocele in the medial aspect of the left upper lobe. Differential included an endobronchial polyp, endobronchial tumor, bronchial stricture or aspirated foreign body.    Electromagnetic navigational bronchoscopy (ENB) on 12/16/2016 revealed non-small cell carcinoma, favor squamous cell lung carcinoma. Tumor was positive for p40 and negative for cdx-2 and TTF-1. He received SBRTto the left upper lobe of 5000 cGy in 5 fractions from 02/16/2017 - 03/02/2017.  EGDon 11/05/2016 revealed gastritis. Pathology confirmed chronic mild gastritis and features of a healing erosion in the stomach. There was no H pylori, dysplasia or malignancy.  Colonoscopy on 12/03/2019revealedgrade II internal hemorrhoids.There was a single 3 mm polyp in the transverse colon(tubular adenoma).  He received 12 cycles of FOLFOXchemotherapy (03/15/2017 - 09/13/2017).   Chest, abdomen, and pelvis CT on 09/19/2018 revealed a markedly enlarged portacaval lymph node (4.2 x 2.5 x 4.8 cm; previously 2.3 x 1.0 x 1.9 cm) concerning for metastatic adenopathy. There were changes of external beam radiation within the left upper lobe. Increased nodular soft tissue attenuation within the radiation port was nonspecific in may represent evolutionary changes of external beam radiation. Recurrent tumor would be difficult to exclude as this was a new finding from 10/11/2017.  He has kidney stones.  CEAhas been followed: 1.4 on 11/09/2016, 3.9 on 10/14/2017, 2.0 on 01/18/2018, 1.7 on 04/21/2018, 2.4 on 07/19/2018, and 2.8 on 09/19/2018.   He has iron deficiency anemia. He is on oral iron. Ferritin was 6 on 09/04/2016. Hematocrit was 29.7 and hemoglobin 8.8 on 09/04/2016. Hematocrit was 28.1, hemoglobin 9.3, and MCV 87.9  on 11/09/2016.   Ferritinhas been followed: 28 on 10/13/2016, 15 on 01/14/2017, 21 on 03/29/2016, 20 on 04/12/2017, 24 on 04/26/2017, 49 on 07/12/2017, 50 on 08/02/2017, 48 on 08/16/2017, 37 on 01/18/2018, 42 on 04/21/2018, and 44 on 07/19/2018.  Echo on 12/15/2016 revealed mild to moderate aortic valve stenosis. There was abnormal LV relaxation consistent with grade I  diastolic dysfunction. Estimated EF was 55-60%.  Symptomatically, he feels good.  He has lost 7 pounds since 04/2018.  Exam is unremarkable.  Plan: 1.   Review labs from 09/19/2018. 2.   Stage IIIB transverse colon cancer Clinically he is doing well. Review interval chest abdomen and pelvic CT scan.  Images personally reviewed.  Concern for enlarging portacaval nodes. Discuss PET scan. Present at tumor board. 3.   Stage I squamous cell carcinoma of the LEFT lung Clinically he is doing well without any respiratory symptoms Interval CT scans personally reviewed. Unclear significance of interval soft tissue prominence in radiation port. Await follow-up PET scan. 4.   Thrombocytopenia, resolved Platelet count151,000 - normal. 5.   Iron deficiency Hemoglobin 13.1. Hematocrit 39.3.MCV 97.5. [phone] with an iron saturation 20% on 07/19/2018. Continue oral iron with OJ or vitamin C. Ferritin goal 100. 6. PET scan on 09/27/2018. 7.   RTC 2 days after PET scan for MD assessment (Doximity), review of PET scan, and discussion regarding direction of therapy.  I discussed the assessment and treatment plan with the patient.  The patient was provided an opportunity to ask questions and all were answered.  The patient agreed with the plan and demonstrated an understanding of the instructions.  The patient was advised to call back if the symptoms worsen or if the condition fails to improve as anticipated.   Lequita Asal, MD, PhD    09/22/2018, 2:47 PM  I, Selena Batten, am acting as scribe for [employer]. Donald Gip, MD, PhD.  I, Elonna Mcfarlane C. Donald Gip, MD, have reviewed the above documentation for accuracy and completeness, and I agree with the above.

## 2018-09-21 ENCOUNTER — Other Ambulatory Visit: Payer: Self-pay

## 2018-09-22 ENCOUNTER — Inpatient Hospital Stay (HOSPITAL_BASED_OUTPATIENT_CLINIC_OR_DEPARTMENT_OTHER): Payer: Medicare Other | Admitting: Hematology and Oncology

## 2018-09-22 ENCOUNTER — Encounter: Payer: Self-pay | Admitting: Hematology and Oncology

## 2018-09-22 ENCOUNTER — Other Ambulatory Visit: Payer: Medicare Other

## 2018-09-22 VITALS — BP 127/52 | HR 90 | Temp 97.9°F | Resp 18 | Ht 71.0 in | Wt 274.7 lb

## 2018-09-22 DIAGNOSIS — Z7189 Other specified counseling: Secondary | ICD-10-CM

## 2018-09-22 DIAGNOSIS — C184 Malignant neoplasm of transverse colon: Secondary | ICD-10-CM | POA: Diagnosis not present

## 2018-09-22 DIAGNOSIS — D509 Iron deficiency anemia, unspecified: Secondary | ICD-10-CM | POA: Diagnosis not present

## 2018-09-22 DIAGNOSIS — Z85118 Personal history of other malignant neoplasm of bronchus and lung: Secondary | ICD-10-CM | POA: Diagnosis not present

## 2018-09-22 DIAGNOSIS — D696 Thrombocytopenia, unspecified: Secondary | ICD-10-CM

## 2018-09-22 DIAGNOSIS — C3412 Malignant neoplasm of upper lobe, left bronchus or lung: Secondary | ICD-10-CM

## 2018-09-22 NOTE — Progress Notes (Signed)
No new changes noted today 

## 2018-09-22 NOTE — Progress Notes (Signed)
Tumor Board Documentation  Emersyn Kotarski. was presented by Dr Mike Gip at our Tumor Board on 09/22/2018, which included representatives from medical oncology, radiation oncology, surgical, radiology, pathology, internal medicine, navigation, palliative care, research.  Quirino currently presents as a current patient, for discussion with history of the following treatments: neoadjuvant radiation.  Additionally, we reviewed previous medical and familial history, history of present illness, and recent lab results along with all available histopathologic and imaging studies. The tumor board considered available treatment options and made the following recommendations: Additional screening, Biopsy PET and possible tissue Biopsy of Lung  The following procedures/referrals were also placed: No orders of the defined types were placed in this encounter.   Clinical Trial Status: not discussed   Staging used: AJCC Stage Group  AJCC Staging:       Group: CRC stage 3 B, Stage 1 Squamous Cell Lung cancer   National site-specific guidelines NCCN were discussed with respect to the case.  Tumor board is a meeting of clinicians from various specialty areas who evaluate and discuss patients for whom a multidisciplinary approach is being considered. Final determinations in the plan of care are those of the provider(s). The responsibility for follow up of recommendations given during tumor board is that of the provider.   Today's extended care, comprehensive team conference, Gerber was not present for the discussion and was not examined.   Multidisciplinary Tumor Board is a multidisciplinary case peer review process.  Decisions discussed in the Multidisciplinary Tumor Board reflect the opinions of the specialists present at the conference without having examined the patient.  Ultimately, treatment and diagnostic decisions rest with the primary provider(s) and the patient.

## 2018-09-23 ENCOUNTER — Ambulatory Visit: Payer: Medicare Other | Admitting: Urology

## 2018-09-27 ENCOUNTER — Encounter
Admission: RE | Admit: 2018-09-27 | Discharge: 2018-09-27 | Disposition: A | Discharge status: Home / Self Care | Payer: Medicare Other | Source: Ambulatory Visit | Attending: Hematology and Oncology | Admitting: Hematology and Oncology

## 2018-09-27 ENCOUNTER — Other Ambulatory Visit: Payer: Self-pay

## 2018-09-27 ENCOUNTER — Other Ambulatory Visit: Payer: Self-pay | Admitting: Hematology and Oncology

## 2018-09-27 DIAGNOSIS — C184 Malignant neoplasm of transverse colon: Secondary | ICD-10-CM

## 2018-09-27 DIAGNOSIS — C3490 Malignant neoplasm of unspecified part of unspecified bronchus or lung: Secondary | ICD-10-CM | POA: Diagnosis not present

## 2018-09-27 DIAGNOSIS — C19 Malignant neoplasm of rectosigmoid junction: Secondary | ICD-10-CM | POA: Diagnosis not present

## 2018-09-27 DIAGNOSIS — Z79899 Other long term (current) drug therapy: Secondary | ICD-10-CM | POA: Diagnosis not present

## 2018-09-27 DIAGNOSIS — R590 Localized enlarged lymph nodes: Secondary | ICD-10-CM | POA: Diagnosis not present

## 2018-09-27 DIAGNOSIS — C3412 Malignant neoplasm of upper lobe, left bronchus or lung: Secondary | ICD-10-CM

## 2018-09-27 LAB — GLUCOSE, CAPILLARY
Glucose-Capillary: 167 mg/dL — ABNORMAL HIGH (ref 70–99)
Glucose-Capillary: 122 mg/dL — ABNORMAL HIGH (ref 70–99)

## 2018-09-27 MED ORDER — FLUDEOXYGLUCOSE F - 18 (FDG) INJECTION
14.4800 | Freq: Once | INTRAVENOUS | Status: AC | PRN
  Administered 2018-09-27: 14.48 via INTRAVENOUS

## 2018-09-27 NOTE — Progress Notes (Signed)
[facility]  [address], [address] [place], [place] [address] Phone: [phone]  Fax: [phone]   Clinic Day:  09/29/2018  Referring physician: Arnetha Courser, MD  Chief Complaint: Donald Marcello. is a 73 y.o. male with squamous cell lung cancer s/p SBRT and stage IIIB colon cancer who is seen for review of interval PET scan and discussion regarding direction of therapy.  HPI: The patient was last seen in the medical oncology clinic on 09/22/2018. At that time, he felt good. He had lost 7 pounds since 04/2018. Exam was unremarkable.  CEA was normal.  CT scans revealed enlarging portacaval nodes and increased nodular soft tissue attenuation within the radiation port (chest).  We discussed pursuing a PET scan.  PET scan on 09/27/2018 revealed enlarged hypermetabolic periportal lymph nodes (2.6 cm node with SUV 12.9 and an adjacent 1.6 cm node with an SUV of 11.2) consistent with metastatic adenopathy.  There was resolution of hypermetabolic nodularity in the left upper lobe with benign-appearing perihilar radiation change.  There was no evidence of mediastinal metastatic adenopathy.  He is s/p right hemicolectomy anatomy with no evidence of local recurrence.   During the interim, he has been doing "okay".  He denies any abdominal symptoms.  The patient is unsure about having a biopsy.   The patient would like all his information to be given to Donald Mcdowell, because the patient states "when I walk out the door I want remember a thing".    Past Medical History:  Diagnosis Date  . Abdominal aortic atherosclerosis ([place]) 09/15/2016   CT scan June 2018  . Allergy   . Cancer ([place])   . Coronary artery calcification seen on CAT scan 09/15/2016   Previous smoker; noted on CT scan June 2018  . Diabetes mellitus without complication ([place])   . GERD (gastroesophageal reflux disease)   . Gout   . Hyperlipidemia   . Hypertension   . Kidney function abnormal    stage 2  . Kidney stones   . Obesity 08/20/2015    Past Surgical History:  Procedure Laterality Date  . COLONOSCOPY WITH PROPOFOL N/A 11/05/2016   Procedure: COLONOSCOPY WITH PROPOFOL;  Surgeon: Lucilla Lame, MD;  Location: [facility];  Service: Gastroenterology;  Laterality: N/A;  . COLONOSCOPY WITH PROPOFOL N/A 02/15/2018   Procedure: COLONOSCOPY WITH PROPOFOL;  Surgeon: Lucilla Lame, MD;  [facility] ENDOSCOPY;  Service: Endoscopy;  Laterality: N/A;  . ELECTROMAGNETIC NAVIGATION BROCHOSCOPY N/A 12/16/2016   Procedure: ELECTROMAGNETIC NAVIGATION BRONCHOSCOPY;  Surgeon: Flora Lipps, MD;  Location: [facility];  Service: Cardiopulmonary;  Laterality: N/A;  . ESOPHAGOGASTRODUODENOSCOPY (EGD) WITH PROPOFOL N/A 11/05/2016   Procedure: ESOPHAGOGASTRODUODENOSCOPY (EGD) WITH PROPOFOL;  Surgeon: Lucilla Lame, MD;  Location: [facility];  Service: Gastroenterology;  Laterality: N/A;  Diabetic - oral meds  . kidney stones    . PARTIAL COLECTOMY N/A 12/28/2016   Procedure: PARTIAL COLECTOMY RIGHT;  Surgeon: Jules Husbands, MD;  Location: [facility];  Service: General;  Laterality: N/A;  . POLYPECTOMY N/A 11/05/2016   Procedure: POLYPECTOMY;  Surgeon: Lucilla Lame, MD;  Location: [facility];  Service: Gastroenterology;  Laterality: N/A;  . PORTACATH PLACEMENT Right 01/18/2017   Procedure: INSERTION PORT-A-CATH;  Surgeon: Jules Husbands, MD;  Location: [facility];  Service: General;  Laterality: Right;    Family History  Problem Relation Age of Onset  . Alzheimer's disease Mother   . Heart disease Father   . Diabetes Father   .  Cancer Father        Prostate  . Cancer Sister        skin cancer    Social History:  reports that he quit smoking about 2 years ago. His smoking use included cigarettes. He has never used smokeless tobacco. He reports that he does not drink alcohol or use drugs. Patient worked for a [employer]. He retired in 1996. He was in his 85s. Patient was  a 1-2 pack per day smoker since he was a child. He stopped smoking in 07/2016. He drank alcohol in the past. He has 3 living sisters (1 sister died). He has no children. Patient married to Donald Mcdowell. Communicate with neighbor Donald Mcdowell [phone]. The patient is accompanied by Donald Mcdowell via phone today.  Allergies: No Known Allergies  Current Medications: Current Outpatient Medications  Medication Sig Dispense Refill  . ACCU-CHEK FASTCLIX LANCETS MISC Check blood sugars three times daily Dx:E11.21, LON:99 months 100 each 3  . allopurinol (ZYLOPRIM) 300 MG tablet Take 1 tablet (300 mg total) by mouth daily. 90 tablet 1  . Blood Glucose Monitoring Suppl (ACCU-CHEK GUIDE ME) w/Device KIT 1 Device by Does not apply route 3 (three) times daily. Dx:E11.21, Lon:99 1 kit 0  . Cyanocobalamin (VITAMIN B-12 PO) Take 1 tablet by mouth daily.    . Ferrous Fumarate (HEMOCYTE - 106 MG FE) 324 (106 Fe) MG TABS tablet Take 1 tablet by mouth.    Marland Kitchen glipiZIDE (GLIPIZIDE XL) 10 MG 24 hr tablet Take 1 tablet (10 mg total) by mouth daily with breakfast. 90 tablet 0  . glucose blood (ACCU-CHEK GUIDE) test strip Check blood sugars three times daily DX:E11.21, LON:99 100 each 12  . insulin NPH Human (NOVOLIN N RELION) 100 UNIT/ML injection Inject 0.35 mLs (35 Units total) into the skin daily before breakfast. 10 mL 0  . Insulin Pen Needle 31G X 6 MM MISC Use to inject insulin subcutaneously once a day in the morning 100 each 0  . Lancets (ACCU-CHEK MULTICLIX) lancets Check fingerstick blood sugars 3x a day; E11.29, LON:99 month 100 each 12  . lidocaine-prilocaine (EMLA) cream Apply to affected area once 30 g 3  . metFORMIN (GLUCOPHAGE) 1000 MG tablet Take 1 tablet (1,000 mg total) by mouth 2 (two) times daily with a meal. 180 tablet 1  . nateglinide (STARLIX) 60 MG tablet Take 1 tablet (60 mg total) by mouth 3 (three) times daily with meals. 270 tablet 0  . pantoprazole (PROTONIX) 20 MG tablet Take 1 tablet  (20 mg total) by mouth daily. 90 tablet 0  . quinapril (ACCUPRIL) 5 MG tablet Take 1 tablet (5 mg total) by mouth daily. 90 tablet 0  . rosuvastatin (CRESTOR) 5 MG tablet Take 1 tablet (5 mg total) by mouth daily. 90 tablet 1  . saw palmetto 160 MG capsule Take 540 mg by mouth 2 (two) times daily.     . tamsulosin (FLOMAX) 0.4 MG CAPS capsule Take 1 capsule (0.4 mg total) by mouth daily. 90 capsule 6   No current facility-administered medications for this visit.    Facility-Administered Medications Ordered in Other Visits  Medication Dose Route Frequency Provider Last Rate Last Dose  . heparin lock flush 100 unit/mL  500 Units Intravenous Once Lequita Asal, MD        Review of Systems  Constitutional: Negative.  Negative for chills, diaphoresis, fever, malaise/fatigue and weight loss (up 1 lb.).       Doing "okay".  HENT:  Negative.  Negative for congestion, ear discharge, nosebleeds, sinus pain and sore throat.   Eyes: Negative.  Negative for blurred vision, double vision, photophobia and pain.  Respiratory: Positive for shortness of breath (exertional, improved). Negative for cough, hemoptysis and sputum production.   Cardiovascular: Negative.  Negative for chest pain, palpitations, orthopnea, leg swelling and PND.  Gastrointestinal: Negative.  Negative for abdominal pain, blood in stool, constipation, diarrhea, heartburn, melena, nausea and vomiting.  Genitourinary: Negative.  Negative for dysuria, frequency, hematuria and urgency.  Musculoskeletal: Positive for joint pain (LEFT knee, improved). Negative for back pain, falls, myalgias and neck pain.  Skin: Negative.  Negative for itching and rash.  Neurological: Negative.  Negative for dizziness, tremors, sensory change, speech change, focal weakness, weakness and headaches.  Endo/Heme/Allergies: Does not bruise/bleed easily.       Diabetes.  Psychiatric/Behavioral: Positive for memory loss (mild). Negative for depression. The  patient is nervous/anxious. The patient does not have insomnia.   All other systems reviewed and are negative.   Performance status (ECOG): 1 Vitals Blood pressure 129/70, pulse 80, temperature (!) 97 F (36.1 C), temperature source Tympanic, resp. rate 18, weight 275 lb 3.9 oz (124.9 kg), SpO2 99 %.  Physical Exam  Constitutional: He is oriented to person, place, and time. He appears well-developed and well-nourished. No distress.  HENT:  Head: Normocephalic and atraumatic.  Gray hair.  Male pattern baldness.  Eyes: Conjunctivae and EOM are normal. No scleral icterus.  Glasses.  Blue eyes.  Left amblyopia.  Neurological: He is alert and oriented to person, place, and time.  Skin: He is not diaphoretic. No erythema. No pallor.  Psychiatric: He has a normal mood and affect. His behavior is normal. Judgment and thought content normal.  Nursing note and vitals reviewed.   Hospital Outpatient Visit on 09/27/2018  Component Date Value Ref Range Status  . Glucose-Capillary 09/27/2018 167* 70 - 99 mg/dL Final  . Glucose-Capillary 09/27/2018 122* 70 - 99 mg/dL Final    Assessment:  Marina Desire. is a 73 y.o. male with stage IIIB transverse colon cancers/p right partial colectomy on 12/28/2016. Pathology revealed a 5.5 cm grade II invasive adenocarcinoma of the transverse colon, extending through the muscularis propria and into the adjacent adipose tissue, including the greater omentum. There was metastatic adenocarcinoma in multiple lymph nodes and tumor deposits, at least 5 involved with a total of at least 18 lymph nodes (5 of 18). Pathologic stagewas T3N2a.CEAwas 1.4 on 11/09/2016.  Abdomen and pelvic CTon 11/09/2016 revealed mid transverse colon segment of luminal narrowing with wall thickening compatible with colon cancer. There was a single enlarged adjacent mesenteric lymph node and prominent subcentimeter lymph nodes anterior to the pancreas which may be metastatic.    Chest CTon 11/18/2016 revealed no metastatic disease. There was abrupt termination of the left upper lobe beyond which the distal bronchus appeared dilated with mild branching into the lung parenchyma. Imaging suggested a bronchocele in the medial aspect of the left upper lobe. Differential included an endobronchial polyp, endobronchial tumor, bronchial stricture or aspirated foreign body.   Electromagnetic navigational bronchoscopy (ENB) on 12/16/2016 revealed non-small cell carcinoma, favor squamous cell lung carcinoma. Tumor was positive for p40 and negative for cdx-2 and TTF-1. He received SBRTto the left upper lobe of 5000 cGy in 5 fractions from 02/16/2017 - 03/02/2017.  EGDon 11/05/2016 revealed gastritis. Pathology confirmed chronic mild gastritis and features of a healing erosion in the stomach. There was no H pylori, dysplasia or malignancy.  Colonoscopy on 12/03/2019revealedgrade II internal hemorrhoids.There was a single 3 mm polyp in the transverse colon(tubular adenoma).  He received 12 cycles of FOLFOXchemotherapy (03/15/2017 - 09/13/2017).   Chest, abdomen, and pelvis CT on 09/19/2018 revealed a markedly enlarged portacaval lymph node (4.2 x 2.5 x 4.8 cm; previously 2.3 x 1.0 x 1.9 cm) concerning for metastatic adenopathy. There were changes of external beam radiation within the left upper lobe. Increased nodular soft tissue attenuation within the radiation port was nonspecific in may represent evolutionary changes of external beam radiation. Recurrent tumor would be difficult to exclude as this was a new finding from 10/11/2017.  He has kidney stones.  PET scan on 09/27/2018 revealed enlarged hypermetabolic periportal lymph nodes (2.6 cm node with SUV 12.9 and an adjacent 1.6 cm node with an SUV of 11.2) consistent with metastatic adenopathy.  There was resolution of hypermetabolic nodularity in the left upper lobe with benign-appearing perihilar radiation change.   There was no evidence of mediastinal metastatic adenopathy.  He is s/p right hemicolectomy anatomy with no evidence of local recurrence.   CEAhas been followed: 1.4 on 11/09/2016, 3.9 on 10/14/2017, 2.0 on 01/18/2018, 1.7 on 04/21/2018, 2.4 on 07/19/2018, and 2.8 on 09/19/2018.  He has iron deficiency anemia. He is on oral iron. Ferritin was 6 on 09/04/2016. Hematocrit was 29.7 and hemoglobin 8.8 on 09/04/2016. Hematocrit was 28.1, hemoglobin 9.3, and MCV 87.9 on 11/09/2016.   Ferritinhas been followed: 28 on 10/13/2016, 15 on 01/14/2017, 21 on 03/29/2016, 20 on 04/12/2017, 24 on 04/26/2017, 49 on 07/12/2017, 50 on 08/02/2017, 48 on 08/16/2017, 37 on 01/18/2018, 42 on 04/21/2018, and 44 on 07/19/2018.  Echo on 12/15/2016 revealed mild to moderate aortic valve stenosis. There was abnormal LV relaxation consistent with grade I diastolic dysfunction. Estimated EF was 55-60%.  Symptomatically, he is doing well.  He denies any respiratory or abdominal symptoms.  Plan: 1.   Stage IIIB transverse colon cancer  Clinically, he continues to do well.  PET scan reviewed personally.  Agree with radiology interpretation.   Imaging reviewed with the patient.  There is no evidence of current disease in the chest.   Periportal lymph nodes are increasing in size and metabolic activity, worrisome for malignancy.   Etiology of these nodes is unclear.    Unclear if nodes are in the drainage pathway from the transverse colon.    Consider upper endoscopy to r/o other primary and assess lymph node accessibility.   No apparent primary on PET scan imaging.   Discuss likely plan for biopsy   Patient is not enthusiastic. Present at tumor board on 10/06/2018. 3.   Stage I squamous cell carcinoma of the LEFT lung Clinically, he continues to do well. PET scan as noted above.  There is no evidence of locally recurrent disease. 4.   Iron deficiency Hemoglobin 13.1. Hematocrit39.3.MCV97.5. Ferritinwas  44 with an iron saturation of 20% on 07/19/2018. Ferritin goal is 100.   Continue oral iron with orange juice or vitamin C. 5.   MD to call patient of following tumor board. 6.   RTC based on above.  I discussed the assessment and treatment plan with the patient.  The patient was provided an opportunity to ask questions and all were answered.  The patient agreed with the plan and demonstrated an understanding of the instructions.  The patient was advised to call back if the symptoms worsen or if the condition fails to improve as anticipated.  I provided 15 minutes of face-to-face time during  this this encounter and > 50% was spent counseling as documented under my assessment and plan.    Lequita Asal, MD, PhD    09/29/2018, 11:07 AM  I, Selena Batten, am acting as scribe for [employer]. Mike Gip, MD, PhD.  I, Melissa C. Mike Gip, MD, have reviewed the above documentation for accuracy and completeness, and I agree with the above.

## 2018-09-29 ENCOUNTER — Inpatient Hospital Stay (HOSPITAL_BASED_OUTPATIENT_CLINIC_OR_DEPARTMENT_OTHER): Payer: Medicare Other | Admitting: Hematology and Oncology

## 2018-09-29 ENCOUNTER — Encounter: Payer: Self-pay | Admitting: Hematology and Oncology

## 2018-09-29 ENCOUNTER — Other Ambulatory Visit: Payer: Self-pay

## 2018-09-29 VITALS — BP 129/70 | HR 80 | Temp 97.0°F | Resp 18 | Wt 275.2 lb

## 2018-09-29 DIAGNOSIS — C184 Malignant neoplasm of transverse colon: Secondary | ICD-10-CM

## 2018-09-29 DIAGNOSIS — Z7189 Other specified counseling: Secondary | ICD-10-CM

## 2018-09-29 DIAGNOSIS — C3412 Malignant neoplasm of upper lobe, left bronchus or lung: Secondary | ICD-10-CM

## 2018-09-29 DIAGNOSIS — D509 Iron deficiency anemia, unspecified: Secondary | ICD-10-CM | POA: Diagnosis not present

## 2018-09-29 DIAGNOSIS — Z85118 Personal history of other malignant neoplasm of bronchus and lung: Secondary | ICD-10-CM | POA: Diagnosis not present

## 2018-09-29 NOTE — Progress Notes (Signed)
Pt here for follow up. Denies any concerns.  

## 2018-10-06 ENCOUNTER — Other Ambulatory Visit: Payer: Medicare Other

## 2018-10-06 NOTE — Progress Notes (Signed)
Tumor Board Documentation  Dominick Morella. was presented by Dr Mike Gip at our Tumor Board on 10/06/2018, which included representatives from medical oncology, radiation oncology, pathology, radiology, surgical, surgical oncology, navigation, internal medicine, research.  [place] currently presents as a current patient, for discussion with history of the following treatments: neoadjuvant radiation.  Additionally, we reviewed previous medical and familial history, history of present illness, and recent lab results along with all available histopathologic and imaging studies. The tumor board considered available treatment options and made the following recommendations:   Send for EUS  The following procedures/referrals were also placed: No orders of the defined types were placed in this encounter.   Clinical Trial Status: not discussed   Staging used: Not Applicable  National site-specific guidelines   were discussed with respect to the case.  Tumor board is a meeting of clinicians from various specialty areas who evaluate and discuss patients for whom a multidisciplinary approach is being considered. Final determinations in the plan of care are those of the provider(s). The responsibility for follow up of recommendations given during tumor board is that of the provider.   Today's extended care, comprehensive team conference, [place] was not present for the discussion and was not examined.   Multidisciplinary Tumor Board is a multidisciplinary case peer review process.  Decisions discussed in the Multidisciplinary Tumor Board reflect the opinions of the specialists present at the conference without having examined the patient.  Ultimately, treatment and diagnostic decisions rest with the primary provider(s) and the patient.

## 2018-10-10 ENCOUNTER — Telehealth: Payer: Self-pay

## 2018-10-10 ENCOUNTER — Other Ambulatory Visit: Payer: Self-pay

## 2018-10-10 NOTE — Telephone Encounter (Signed)
Call placed to Donald Mcdowell to arrange EUS. First available EUS at [facility] is 11/03/18. They have elected to have procedure performed quicker at [facility]. The order has been placed and notes uploaded in [place]. [facility] will contact Donald Mcdowell directly for scheduling.

## 2018-10-11 ENCOUNTER — Other Ambulatory Visit: Payer: Self-pay

## 2018-10-11 ENCOUNTER — Ambulatory Visit (INDEPENDENT_AMBULATORY_CARE_PROVIDER_SITE_OTHER): Payer: Medicare Other | Admitting: Urology

## 2018-10-11 ENCOUNTER — Encounter: Payer: Self-pay | Admitting: Urology

## 2018-10-11 VITALS — BP 118/70 | HR 91 | Ht 71.0 in | Wt 276.0 lb

## 2018-10-11 DIAGNOSIS — N200 Calculus of kidney: Secondary | ICD-10-CM | POA: Diagnosis not present

## 2018-10-11 DIAGNOSIS — N401 Enlarged prostate with lower urinary tract symptoms: Secondary | ICD-10-CM | POA: Diagnosis not present

## 2018-10-11 DIAGNOSIS — N281 Cyst of kidney, acquired: Secondary | ICD-10-CM

## 2018-10-11 NOTE — Progress Notes (Signed)
10/11/2018 10:22 AM   Donald Mcdowell 04/11/1945 [id number]  Referring provider: Arnetha Courser, MD [address] [place],   [address]  Chief Complaint  Patient presents with  . Follow-up    Urologic history:  1.  Bilateral nephrolithiasis   -prior shockwave lithotripsy, ureteroscopy and cystolitholapaxy  2. BPH with lower urinary tract symptoms  -on tamsulosin  3.   Renal cysts  -Simple and Bosniak 2  HPI: Donald Mcdowell presents for annual follow-up.  He was recently seen in oncology and had a CT of the abdomen pelvis which showed bilateral, nonobstructing renal calculi and stable bilateral renal cyst.  He was incidentally noted to have portacaval adenopathy suspicious for metastatic disease.  He is scheduled for a biopsy at [facility] next week.    He has stable lower urinary tract symptoms on tamsulosin and saw palmetto.  Denies dysuria or gross hematuria.  Denies flank, abdominal, pelvic or scrotal pain.   PMH: Past Medical History:  Diagnosis Date  . Abdominal aortic atherosclerosis ([place]) 09/15/2016   CT scan June 2018  . Allergy   . Cancer ([place])   . Coronary artery calcification seen on CAT scan 09/15/2016   Previous smoker; noted on CT scan June 2018  . Diabetes mellitus without complication ([place])   . GERD (gastroesophageal reflux disease)   . Gout   . Hyperlipidemia   . Hypertension   . Kidney function abnormal    stage 2  . Kidney stones   . Obesity 08/20/2015    Surgical History: Past Surgical History:  Procedure Laterality Date  . COLONOSCOPY WITH PROPOFOL N/A 11/05/2016   Procedure: COLONOSCOPY WITH PROPOFOL;  Surgeon: Donald Lame, MD;  Location: [place];  Service: Gastroenterology;  Laterality: N/A;  . COLONOSCOPY WITH PROPOFOL N/A 02/15/2018   Procedure: COLONOSCOPY WITH PROPOFOL;  Surgeon: Donald Lame, MD;  Location: [facility] ENDOSCOPY;  Service: Endoscopy;  Laterality: N/A;  . ELECTROMAGNETIC NAVIGATION BROCHOSCOPY N/A  12/16/2016   Procedure: ELECTROMAGNETIC NAVIGATION BRONCHOSCOPY;  Surgeon: Donald Lipps, MD;  Location: [facility];  Service: Cardiopulmonary;  Laterality: N/A;  . ESOPHAGOGASTRODUODENOSCOPY (EGD) WITH PROPOFOL N/A 11/05/2016   Procedure: ESOPHAGOGASTRODUODENOSCOPY (EGD) WITH PROPOFOL;  Surgeon: Donald Lame, MD;  Location: [place];  Service: Gastroenterology;  Laterality: N/A;  Diabetic - oral meds  . kidney stones    . PARTIAL COLECTOMY N/A 12/28/2016   Procedure: PARTIAL COLECTOMY RIGHT;  Surgeon: Donald Husbands, MD;  Location: [facility];  Service: General;  Laterality: N/A;  . POLYPECTOMY N/A 11/05/2016   Procedure: POLYPECTOMY;  Surgeon: Donald Lame, MD;  Location: [employer];  Service: Gastroenterology;  Laterality: N/A;  . PORTACATH PLACEMENT Right 01/18/2017   Procedure: INSERTION PORT-A-CATH;  Surgeon: Donald Husbands, MD;  Location: [facility];  Service: General;  Laterality: Right;    Home Medications:  Allergies as of 10/11/2018   No Known Allergies     Medication List       Accurate as of October 11, 2018 10:22 AM. If you have any questions, ask your nurse or doctor.        Accu-Chek Guide Me w/Device Kit 1 Device by Does not apply route 3 (three) times daily. Dx:E11.21, Lon:99   accu-chek multiclix lancets Check fingerstick blood sugars 3x a day; E11.29, LON:99 month   Accu-Chek FastClix Lancets Misc Check blood sugars three times daily Dx:E11.21, LON:99 months   allopurinol 300 MG tablet Commonly known as: ZYLOPRIM Take 1 tablet (300 mg total) by mouth  daily.   Ferrous Fumarate 324 (106 Fe) MG Tabs tablet Commonly known as: HEMOCYTE - 106 mg FE Take 1 tablet by mouth.   glipiZIDE 10 MG 24 hr tablet Commonly known as: glipiZIDE XL Take 1 tablet (10 mg total) by mouth daily with breakfast.   glucose blood test strip Commonly known as: Accu-Chek Guide Check blood sugars three times daily DX:E11.21, LON:99   insulin NPH Human 100 UNIT/ML injection  Commonly known as: NovoLIN N ReliOn Inject 0.35 mLs (35 Units total) into the skin daily before breakfast.   Insulin Pen Needle 31G X 6 MM Misc Use to inject insulin subcutaneously once a day in the morning   lidocaine-prilocaine cream Commonly known as: EMLA Apply to affected area once   metFORMIN 1000 MG tablet Commonly known as: GLUCOPHAGE Take 1 tablet (1,000 mg total) by mouth 2 (two) times daily with a meal.   nateglinide 60 MG tablet Commonly known as: STARLIX Take 1 tablet (60 mg total) by mouth 3 (three) times daily with meals.   pantoprazole 20 MG tablet Commonly known as: PROTONIX Take 1 tablet (20 mg total) by mouth daily.   quinapril 5 MG tablet Commonly known as: ACCUPRIL Take 1 tablet (5 mg total) by mouth daily.   rosuvastatin 5 MG tablet Commonly known as: Crestor Take 1 tablet (5 mg total) by mouth daily.   saw palmetto 160 MG capsule Take 540 mg by mouth 2 (two) times daily.   tamsulosin 0.4 MG Caps capsule Commonly known as: FLOMAX Take 1 capsule (0.4 mg total) by mouth daily.   VITAMIN B-12 PO Take 1 tablet by mouth daily.       Allergies: No Known Allergies  Family History: Family History  Problem Relation Age of Onset  . Alzheimer's disease Mother   . Heart disease Father   . Diabetes Father   . Cancer Father        Prostate  . Cancer Sister        skin cancer    Social History:  reports that he quit smoking about 2 years ago. His smoking use included cigarettes. He has never used smokeless tobacco. He reports that he does not drink alcohol or use drugs.  ROS: UROLOGY Frequent Urination?: No Hard to postpone urination?: No Burning/pain with urination?: No Get up at night to urinate?: No Leakage of urine?: No Urine stream starts and stops?: No Trouble starting stream?: No Do you have to strain to urinate?: No Blood in urine?: No Urinary tract infection?: No Sexually transmitted disease?: No Injury to kidneys or bladder?:  No Painful intercourse?: No Weak stream?: No Erection problems?: No Penile pain?: No  Gastrointestinal Nausea?: No Vomiting?: No Indigestion/heartburn?: No Diarrhea?: No Constipation?: No  Constitutional Fever: No Night sweats?: No Weight loss?: No Fatigue?: No  Skin Skin rash/lesions?: No Itching?: No  Eyes Blurred vision?: No Double vision?: No  Ears/Nose/Throat Sore throat?: No Sinus problems?: No  Hematologic/Lymphatic Swollen glands?: No Easy bruising?: No  Cardiovascular Leg swelling?: No Chest pain?: No  Respiratory Cough?: No Shortness of breath?: No  Endocrine Excessive thirst?: No  Musculoskeletal Back pain?: No Joint pain?: No  Neurological Headaches?: No Dizziness?: No  Psychologic Depression?: No Anxiety?: No  Physical Exam: BP 118/70 (BP Location: Left Arm, Patient Position: Sitting, Cuff Size: Normal)   Pulse 91   Ht 5' 11"  (1.803 m)   Wt 276 lb (125.2 kg)   BMI 38.49 kg/m   Constitutional:  Alert and oriented, No acute distress.  HEENT: [place] AT, moist mucus membranes.  Trachea midline, no masses. Cardiovascular: No clubbing, cyanosis, or edema. Respiratory: Normal respiratory effort, no increased work of breathing. Skin: No rashes, bruises or suspicious lesions. Neurologic: Grossly intact, no focal deficits, moving all 4 extremities. Psychiatric: Normal mood and affect.   Assessment & Plan:    - Stable lower urinary tract symptoms on tamsulosin  - Bilateral, nonobstructing renal calculi  - Stable bilateral renal cysts  Continue annual follow-up and he is instructed to call earlier for any change in his symptoms.   Abbie Sons, [place] [address] [place], [place] [address] [phone]

## 2018-10-15 DIAGNOSIS — Z01812 Encounter for preprocedural laboratory examination: Secondary | ICD-10-CM | POA: Diagnosis not present

## 2018-10-15 DIAGNOSIS — Z1159 Encounter for screening for other viral diseases: Secondary | ICD-10-CM | POA: Diagnosis not present

## 2018-10-18 DIAGNOSIS — E1140 Type 2 diabetes mellitus with diabetic neuropathy, unspecified: Secondary | ICD-10-CM | POA: Diagnosis not present

## 2018-10-18 DIAGNOSIS — C184 Malignant neoplasm of transverse colon: Secondary | ICD-10-CM | POA: Diagnosis not present

## 2018-10-18 DIAGNOSIS — Z9049 Acquired absence of other specified parts of digestive tract: Secondary | ICD-10-CM | POA: Diagnosis not present

## 2018-10-18 DIAGNOSIS — K449 Diaphragmatic hernia without obstruction or gangrene: Secondary | ICD-10-CM | POA: Diagnosis not present

## 2018-10-18 DIAGNOSIS — C3412 Malignant neoplasm of upper lobe, left bronchus or lung: Secondary | ICD-10-CM | POA: Diagnosis not present

## 2018-10-18 DIAGNOSIS — M109 Gout, unspecified: Secondary | ICD-10-CM | POA: Diagnosis not present

## 2018-10-18 DIAGNOSIS — Z79899 Other long term (current) drug therapy: Secondary | ICD-10-CM | POA: Diagnosis not present

## 2018-10-18 DIAGNOSIS — I129 Hypertensive chronic kidney disease with stage 1 through stage 4 chronic kidney disease, or unspecified chronic kidney disease: Secondary | ICD-10-CM | POA: Diagnosis not present

## 2018-10-18 DIAGNOSIS — C772 Secondary and unspecified malignant neoplasm of intra-abdominal lymph nodes: Secondary | ICD-10-CM | POA: Diagnosis not present

## 2018-10-18 DIAGNOSIS — K8689 Other specified diseases of pancreas: Secondary | ICD-10-CM | POA: Diagnosis not present

## 2018-10-18 DIAGNOSIS — R935 Abnormal findings on diagnostic imaging of other abdominal regions, including retroperitoneum: Secondary | ICD-10-CM | POA: Diagnosis not present

## 2018-10-18 DIAGNOSIS — K862 Cyst of pancreas: Secondary | ICD-10-CM | POA: Diagnosis not present

## 2018-10-18 DIAGNOSIS — E1122 Type 2 diabetes mellitus with diabetic chronic kidney disease: Secondary | ICD-10-CM | POA: Diagnosis not present

## 2018-10-18 DIAGNOSIS — K869 Disease of pancreas, unspecified: Secondary | ICD-10-CM | POA: Diagnosis not present

## 2018-10-18 DIAGNOSIS — Z794 Long term (current) use of insulin: Secondary | ICD-10-CM | POA: Diagnosis not present

## 2018-10-18 DIAGNOSIS — C182 Malignant neoplasm of ascending colon: Secondary | ICD-10-CM | POA: Diagnosis not present

## 2018-10-18 DIAGNOSIS — C787 Secondary malignant neoplasm of liver and intrahepatic bile duct: Secondary | ICD-10-CM | POA: Diagnosis not present

## 2018-10-18 DIAGNOSIS — Z8500 Personal history of malignant neoplasm of unspecified digestive organ: Secondary | ICD-10-CM | POA: Diagnosis not present

## 2018-10-18 DIAGNOSIS — C189 Malignant neoplasm of colon, unspecified: Secondary | ICD-10-CM | POA: Diagnosis not present

## 2018-10-18 DIAGNOSIS — N182 Chronic kidney disease, stage 2 (mild): Secondary | ICD-10-CM | POA: Diagnosis not present

## 2018-10-19 ENCOUNTER — Encounter: Payer: Self-pay | Admitting: Internal Medicine

## 2018-10-24 ENCOUNTER — Telehealth: Payer: Self-pay

## 2018-10-24 NOTE — Telephone Encounter (Signed)
Pathology available. Dr. Mike Gip notified.   Cytology, Non GYN Fine Needle Aspiration Biopsy: [id number] Order: [id number] Collected: 10/18/2018 13:46 Status: Final result   Visible to patient: No (Not Released) Dx: Malignant neoplasm of transverse colo... Component  Specimen Source A  Lymph node, porta hepatis Diagnostic Interpretation A DIAGNOSTIC OF MALIGNANCY. Metastatic adenocarcinoma, consistent with clinical history of colorectal adenocarcinoma. See Diagnostic Comment.   Electronically signed by Lelon Frohlich, MD on 10/20/2018 at 1315 Comment  The following immunohistochemistry stains have been reviewed:   A1-2      CK7      negative A1-3      CK20    positive A1-4      CDX-2   positive A1-5      SATB2  positive A1-6      TTF-1       Negative   The cell block contains scant tumor for ancillary studies, if clinically indicated.   Procedure Notes  Endoscopic ultrasound guided fine needle aspiration biopsy of a porta-hepatis lymph node performed by Dr. Mont Dutton, GI. Clinical Information  A 73 y.o. male with hx of colon cancer s/p chemo and partial colectomy 12/2016 and NSCLC s/p LUL SBRT presents with porta hepatis lymphadenopathy. Material Submitted  15 mls slight blood-tinged fluid received in formalin. 1 cell block prepared.   Immediate Assessment A  Not provided. Disclaimer  All immunohistochemical and in situ hybridization tests performed at [facility] and reported herein were developed, validated and their performance characteristics determined by the, [facility]. During the performance of these tests, appropriate positive and negative control slides are also performed and reviewed. All control slides and internal controls (when applicable) demonstrate the expected immunoreactive patterns and/or nucleic acid hybridization. Some of the tests may not be cleared or approved by the U.S. Food and Drug Administration (FDA).  The FDA has  determined that such clearance or approval is not necessary.  These tests are used for clinical purposes and should not be regarded as investigational or as research.  This laboratory is certified under the [place] (CLIA) as qualified to perform high complexity clinical testing. Attestation  All of the diagnostic evaluations on the enumerated specimens have been personally conducted by the pathologists involved in the care of this patient as indicated by the electronic signatures above. Resulting Agency  Specimen Collected: 10/18/18 13:46 Last Resulted: 10/20/18 13:15      Scans on Order [id number]    Pathology resident/fellow: None assigned. Pathologist: None assigned. 10/20/2018  1:15 PM - Maxie Better Brantley Persons, MD

## 2018-10-26 ENCOUNTER — Telehealth: Payer: Self-pay | Admitting: *Deleted

## 2018-10-26 NOTE — Telephone Encounter (Signed)
  Please schedule a [facility] appointment this week to discuss the results.  M

## 2018-10-26 NOTE — Telephone Encounter (Signed)
Call from [name] asking for results form [facility] of the biopsy Return number [phone]  Cytology, Non GYN Fine Needle Aspiration BiopsyResulted: 10/20/2018 1:15 PM [place] Component Name Value Ref Range  Case Report Fine Needle Aspirate               Case: [id number]                 Authorizing Provider: Vernie Ammons, MD Collected:      10/18/2018 1346       Ordering Location:   [facility]      Received:      10/18/2018 1609       Specimen:  Lymph Node, Porta -hepatis Lymph Node FNA ac/er                      Specimen Source A Lymph node, porta hepatis   Diagnostic Interpretation A DIAGNOSTIC OF MALIGNANCY.    Metastatic adenocarcinoma, consistent with clinical history of colorectal adenocarcinoma. See Diagnostic Comment.     Comment The following immunohistochemistry stains have been reviewed:  A1-2 CK7 negative A1-3 CK20 positive A1-4 CDX-2 positive A1-5 SATB2 positive A1-6 TTF-1    Negative  The cell block contains scant tumor for ancillary studies, if clinically indicated.     Procedure Notes Endoscopic ultrasound guided fine needle aspiration biopsy of a porta-hepatis lymph node performed by Dr. Mont Dutton, GI.   Clinical Information A 73 y.o. male with hx of colon cancer s/p chemo and partial colectomy 12/2016 and NSCLC s/p LUL SBRT presents with porta hepatis lymphadenopathy.   Material Submitted 15 mls slight blood-tinged fluid received in formalin. 1 cell block prepared.   Immediate Assessment A Not provided.   Disclaimer All immunohistochemical and in situ hybridization tests performed at [facility] and reported herein were developed, validated and their performance characteristics determined by the, [facility]. During the performance of these tests, appropriate positive and negative control slides are also performed and  reviewed. All control slides and internal controls (when applicable) demonstrate the expected immunoreactive patterns and/or nucleic acid hybridization. Some of the tests may not be cleared or approved by the U.S. Food and Drug Administration (FDA). The FDA has determined that such clearance or approval is not necessary. These tests are used for clinical purposes and should not be regarded as investigational or as research. This laboratory is certified under the [place] (CLIA) as qualified to perform high complexity clinical testing.   Attestation All of the diagnostic evaluations on the enumerated specimens have been personally conducted by the pathologists involved in the care of this patient as indicated by the electronic signatures above.   Specimen Collected on  FNA - Structure of lymph node (body structure) 10/18/2018 1:46 PM

## 2018-10-27 NOTE — Progress Notes (Signed)
[facility]  [address], [address] [place], [place] [address] Phone: [phone]  Fax: [phone]   Telemedicine Office Visit:  10/28/2018  Referring physician: Hubbard Hartshorn, FNP  I connected with Donald Pou. on 10/28/2018 at 2:54 PM by videoconferencing and verified that I was speaking with the correct person using 2 identifiers.  The patient was at home.  I discussed the limitations, risk, security and privacy concerns of performing an evaluation and management service by videoconferencing and the availability of in person appointments.  I also discussed with the patient that there may be a patient responsible charge related to this service.  The patient expressed understanding and agreed to proceed.   Chief Complaint: Donald Kron. is a 73 y.o. male with squamous cell lung cancer s/p SBRT and stage IIIB colon cancer who is seen for review of interval EUS and discussion regarding direction of therapy.  HPI: The patient was last seen in the oncology clinic on 09/29/2018. At that time, he was doing well. He denied any respiratory or abdominal symptoms. PET scan revealed periportal lymph nodes increasing in size and metabolic activity, worrisome for malignancy.  We discussed biopsy.  His case was presented at Tumor Board on 10/06/2018. Recommendation was EUS with biopsy.   He underwent an EUS on 10/18/2018 at [facility].  There was a 8.82 x 2.46 cm porta hepatis node.  Pathology confirmed metastatic adenocarcinoma, consistent with clinical history of colorectal adenocarcinoma. IHC was negative for CK7 and TTF-1 and positive for CK20, CDX-2, and SATB2.   He was seen in urology by Dr. Bernardo Heater on 10/11/2018. He was stable and annual follow-up was scheduled.   During the interim, the patient is doing well. He will consider treatment options as we await Foundation One testing results.    Past Medical History:  Diagnosis Date   Abdominal aortic  atherosclerosis ([place]) 09/15/2016   CT scan June 2018   Allergy    Cancer [facility])    Coronary artery calcification seen on CAT scan 09/15/2016   Previous smoker; noted on CT scan June 2018   Diabetes mellitus without complication ([place])    GERD (gastroesophageal reflux disease)    Gout    Hyperlipidemia    Hypertension    Kidney function abnormal    stage 2   Kidney stones    Obesity 08/20/2015    Past Surgical History:  Procedure Laterality Date   COLONOSCOPY WITH PROPOFOL N/A 11/05/2016   Procedure: COLONOSCOPY WITH PROPOFOL;  Surgeon: Lucilla Lame, MD;  Location: [place];  Service: Gastroenterology;  Laterality: N/A;   COLONOSCOPY WITH PROPOFOL N/A 02/15/2018   Procedure: COLONOSCOPY WITH PROPOFOL;  Surgeon: Lucilla Lame, MD;  Location: [facility] ENDOSCOPY;  Service: Endoscopy;  Laterality: N/A;   ELECTROMAGNETIC NAVIGATION BROCHOSCOPY N/A 12/16/2016   Procedure: ELECTROMAGNETIC NAVIGATION BRONCHOSCOPY;  Surgeon: Flora Lipps, MD;  Location: [facility] ORS;  Service: Cardiopulmonary;  Laterality: N/A;   ESOPHAGOGASTRODUODENOSCOPY (EGD) WITH PROPOFOL N/A 11/05/2016   Procedure: ESOPHAGOGASTRODUODENOSCOPY (EGD) WITH PROPOFOL;  Surgeon: Lucilla Lame, MD;  Location: [place];  Service: Gastroenterology;  Laterality: N/A;  Diabetic - oral meds   kidney stones     PARTIAL COLECTOMY N/A 12/28/2016   Procedure: PARTIAL COLECTOMY RIGHT;  Surgeon: Jules Husbands, MD;  Location: [facility] ORS;  Service: General;  Laterality: N/A;   POLYPECTOMY N/A 11/05/2016   Procedure: POLYPECTOMY;  Surgeon: Lucilla Lame, MD;  Location: [place];  Service: Gastroenterology;  Laterality: N/A;  PORTACATH PLACEMENT Right 01/18/2017   Procedure: INSERTION PORT-A-CATH;  Surgeon: Jules Husbands, MD;  Location: [facility] ORS;  Service: General;  Laterality: Right;    Family History  Problem Relation Age of Onset   Alzheimer's disease Mother    Heart disease Father    Diabetes Father      Cancer Father        Prostate   Cancer Sister        skin cancer    Social History:  reports that he quit smoking about 2 years ago. His smoking use included cigarettes. He has never used smokeless tobacco. He reports that he does not drink alcohol or use drugs. Patient worked for a [employer]. He retired in 1996. He was in his 35s. Patient was a 1-2 pack per day smoker since he was a child. He stopped smoking in 07/2016.He drank alcohol in the past. He has 3 living sisters (1 sister died).He has no children. Patient married to [employer]. Communicate with neighbor [name] [phone]. The patient is accompanied by [name] today.  Participants in the patient's visit and their role in the encounter included the patient and Vito Berger, CMA, today.  The intake visit was provided by Vito Berger, CMA.  Allergies: No Known Allergies  Current Medications: Current Outpatient Medications  Medication Sig Dispense Refill   ACCU-CHEK FASTCLIX LANCETS MISC Check blood sugars three times daily Dx:E11.21, LON:99 months 100 each 3   allopurinol (ZYLOPRIM) 300 MG tablet Take 1 tablet (300 mg total) by mouth daily. 90 tablet 1   Blood Glucose Monitoring Suppl (ACCU-CHEK GUIDE ME) w/Device KIT 1 Device by Does not apply route 3 (three) times daily. Dx:E11.21, Lon:99 1 kit 0   Cyanocobalamin (VITAMIN B-12 PO) Take 1 tablet by mouth daily.     Ferrous Fumarate (HEMOCYTE - 106 MG FE) 324 (106 Fe) MG TABS tablet Take 1 tablet by mouth.     glipiZIDE (GLIPIZIDE XL) 10 MG 24 hr tablet Take 1 tablet (10 mg total) by mouth daily with breakfast. 90 tablet 0   glucose blood (ACCU-CHEK GUIDE) test strip Check blood sugars three times daily DX:E11.21, LON:99 100 each 12   insulin NPH Human (NOVOLIN N RELION) 100 UNIT/ML injection Inject 0.35 mLs (35 Units total) into the skin daily before breakfast. 10 mL 0   Insulin Pen Needle 31G X 6 MM MISC Use to inject insulin  subcutaneously once a day in the morning 100 each 0   Lancets (ACCU-CHEK MULTICLIX) lancets Check fingerstick blood sugars 3x a day; E11.29, LON:99 month 100 each 12   metFORMIN (GLUCOPHAGE) 1000 MG tablet Take 1 tablet (1,000 mg total) by mouth 2 (two) times daily with a meal. 180 tablet 1   nateglinide (STARLIX) 60 MG tablet Take 1 tablet (60 mg total) by mouth 3 (three) times daily with meals. 270 tablet 0   pantoprazole (PROTONIX) 20 MG tablet Take 1 tablet (20 mg total) by mouth daily. 90 tablet 0   quinapril (ACCUPRIL) 5 MG tablet Take 1 tablet (5 mg total) by mouth daily. 90 tablet 0   rosuvastatin (CRESTOR) 5 MG tablet Take 1 tablet (5 mg total) by mouth daily. 90 tablet 1   saw palmetto 160 MG capsule Take 540 mg by mouth 2 (two) times daily.      tamsulosin (FLOMAX) 0.4 MG CAPS capsule Take 1 capsule (0.4 mg total) by mouth daily. 90 capsule 6   lidocaine-prilocaine (EMLA) cream Apply to affected area once (Patient  not taking: Reported on 10/28/2018) 30 g 3   No current facility-administered medications for this visit.    Facility-Administered Medications Ordered in Other Visits  Medication Dose Route Frequency Provider Last Rate Last Dose   heparin lock flush 100 unit/mL  500 Units Intravenous Once Lequita Asal, MD        Review of Systems  Constitutional: Negative.  Negative for chills, diaphoresis, fever, malaise/fatigue and weight loss.       Doing well.  HENT: Negative.  Negative for congestion, ear discharge, hearing loss, nosebleeds, sinus pain and sore throat.   Eyes: Negative.  Negative for blurred vision, double vision, photophobia and pain.  Respiratory: Positive for shortness of breath (exertional, improved). Negative for cough and wheezing.   Cardiovascular: Negative.  Negative for chest pain, palpitations, orthopnea, leg swelling and PND.  Gastrointestinal: Negative.  Negative for abdominal pain, blood in stool, constipation, diarrhea, heartburn,  melena, nausea and vomiting.  Genitourinary: Negative.  Negative for dysuria, frequency, hematuria and urgency.  Musculoskeletal: Negative for back pain, falls, joint pain, myalgias and neck pain.  Skin: Negative.  Negative for itching and rash.  Neurological: Negative.  Negative for dizziness, tremors, sensory change, speech change, focal weakness, weakness and headaches.  Endo/Heme/Allergies: Does not bruise/bleed easily.       Diabetes.  Psychiatric/Behavioral: Positive for memory loss (mild). Negative for depression and substance abuse. The patient is not nervous/anxious and does not have insomnia.        Disappointed.  All other systems reviewed and are negative.   Performance status (ECOG): 1  Physical Exam  Constitutional: He is oriented to person, place, and time. He appears well-developed and well-nourished. No distress.  HENT:  Head: Normocephalic and atraumatic.  Gray hair.  Male pattern baldness.  Eyes: Conjunctivae and EOM are normal. No scleral icterus.  Glasses.  Blue eyes.  Left amblyopia.  Neurological: He is alert and oriented to person, place, and time.  Skin: He is not diaphoretic. No erythema. No pallor.  Psychiatric: He has a normal mood and affect. His behavior is normal. Judgment and thought content normal.  Nursing note and vitals reviewed.   No visits with results within 3 Day(s) from this visit.  Latest known visit with results is:  Hospital Outpatient Visit on 09/27/2018  Component Date Value Ref Range Status   Glucose-Capillary 09/27/2018 167* 70 - 99 mg/dL Final   Glucose-Capillary 09/27/2018 122* 70 - 99 mg/dL Final    Assessment:  Donald Winthrop. is a 73 y.o. male with stage IIIB transverse colon cancers/p right partial colectomy on 12/28/2016. Pathology revealed a 5.5 cm grade II invasive adenocarcinoma of the transverse colon, extending through the muscularis propria and into the adjacent adipose tissue, including the greater omentum. There  was metastatic adenocarcinoma in multiple lymph nodes and tumor deposits, at least 5 involved with a total of at least 18 lymph nodes (5 of 18). Pathologic stagewas T3N2a.CEAwas 1.4 on 11/09/2016.  Abdomen and pelvic CTon 11/09/2016 revealed mid transverse colon segment of luminal narrowing with wall thickening compatible with colon cancer. There was a single enlarged adjacent mesenteric lymph node and prominent subcentimeter lymph nodes anterior to the pancreas which may be metastatic.   Chest CTon 11/18/2016 revealed no metastatic disease. There was abrupt termination of the left upper lobe beyond which the distal bronchus appeared dilated with mild branching into the lung parenchyma. Imaging suggested a bronchocele in the medial aspect of the left upper lobe. Differential included an endobronchial polyp,  endobronchial tumor, bronchial stricture or aspirated foreign body.   Electromagnetic navigational bronchoscopy (ENB) on 12/16/2016 revealed non-small cell carcinoma, favor squamous cell lung carcinoma. Tumor was positive for p40 and negative for cdx-2 and TTF-1. He received SBRTto the left upper lobe of 5000 cGy in 5 fractions from 02/16/2017 - 03/02/2017.  EGDon 11/05/2016 revealed gastritis. Pathology confirmed chronic mild gastritis and features of a healing erosion in the stomach. There was no H pylori, dysplasia or malignancy. Colonoscopy on 12/03/2019revealedgrade II internal hemorrhoids.There was a single 3 mm polyp in the transverse colon(tubular adenoma).  He received 12 cycles of FOLFOXchemotherapy (03/15/2017 - 09/13/2017).   Chest, abdomen, and pelvisCTon 09/19/2018 revealed a markedly enlarged portacaval lymph node(4.2 x 2.5 x 4.8 cm; previously 2.3 x 1.0 x 1.9 cm)concerning for metastatic adenopathy.There were changes of external beam radiation within the left upper lobe. Increased nodular soft tissue attenuation within the radiation portwas  nonspecific in may represent evolutionary changes of external beam radiation. Recurrent tumor would be difficult to exclude as thiswas a new finding from 10/11/2017.He has kidney stones.  PET scan on 09/27/2018 revealed enlarged hypermetabolic periportal lymph nodes (2.6 cm node with SUV 12.9 and an adjacent 1.6 cm node with an SUV of 11.2) consistent with metastatic adenopathy.  There was resolution of hypermetabolic nodularity in the left upper lobe with benign-appearing perihilar radiation change.  There was no evidence of mediastinal metastatic adenopathy.  He is s/p right hemicolectomy anatomy with no evidence of local recurrence.   EUS on 10/18/2018 at [facility] confirmed metastatic adenocarcinoma c/w clinical history of colorectal adenocarcinoma. IHC was negative for CK7 and TTF-1 and positive for CK20, CDX-2, and SATB2.   CEAhas been followed: 1.4 on 11/09/2016, 3.9 on 10/14/2017, 2.0 on 01/18/2018, 1.7 on 04/21/2018, 2.4 on 07/19/2018, and 2.8 on 09/19/2018.  He has iron deficiency anemia. He is on oral iron. Ferritin was 6 on 09/04/2016. Hematocrit was 29.7 and hemoglobin 8.8 on 09/04/2016. Hematocrit was 28.1, hemoglobin 9.3, and MCV 87.9 on 11/09/2016.   Ferritinhas been followed: 28 on 10/13/2016, 15 on 01/14/2017, 21 on 03/29/2016, 20 on 04/12/2017, 24 on 04/26/2017, 49 on 07/12/2017, 50 on 08/02/2017, 48 on 08/16/2017, 37 on 01/18/2018, 42 on 04/21/2018, and 44 on 07/19/2018.  [place] 12/15/2016 revealed mild to moderate aortic valve stenosis. There was abnormal LV relaxation consistent with grade I diastolic dysfunction. Estimated EF was 55-60%.  Symptomatically, he denies any complaint.  Plan: 1.  Stage IIIB transverse colon cancer             Clinically, he continues to do well.    He is asymptomatic regarding the hypermetabolic periportal lymph nodes documented on PET scan.  Review interval endoscopic ultrasound at [facility].  Pathology confirmed metastatic  adenocarcinoma of the colon.  Tissue sent for Foundation One to assess potential treatment options beyond chemotherapy.   Lymph node conglomerate is not resectable.  Unclear if patient would be a candidate for palliative SBRT.  Discuss an irinotecan based regimen.  Consider nivolumab +/- ipilumumab if MMR/MSI-H.  Consider encorafenib +panitumumab if BRAF+. Present at tumor board on 11/03/2018. 2.Stage I squamous cell carcinoma of the LEFT lung Clinically, he is doing well.   Pathology from above reveals no evidence of recurrent disease. 3.Iron deficiency Hemoglobin 13.1. Hematocrit39.3.MCV97.5 on 09/19/2018. Ferritinwas 44 with an iron saturation of 20% on 07/19/2018. Check ferritin at next lab draw.  Ferritin goal is 100. Continue oral iron. 4.   RTC after Foundation One testing back for MD assessment (in person) and  review of options.  I discussed the assessment and treatment plan with the patient.  The patient was provided an opportunity to ask questions and all were answered.  The patient agreed with the plan and demonstrated an understanding of the instructions.  The patient was advised to call back or seek an in person evaluation if the symptoms worsen or if the condition fails to improve as anticipated.  I provided 22 minutes (2:54 PM - 3:15 PM) of face-to-face video visit time during this this encounter and > 50% was spent counseling as documented under my assessment and plan.  I provided these services from the [facility] office.   Nolon Stalls, MD, PhD  10/28/2018, 2:54 PM  I, Molly Dorshimer, am acting as Education administrator for [employer]. Mike Gip, MD, PhD.  I, Alastair Hennes C. Mike Gip, MD, have reviewed the above documentation for accuracy and completeness, and I agree with the above.

## 2018-10-28 ENCOUNTER — Inpatient Hospital Stay: Payer: Medicare Other | Attending: Hematology and Oncology | Admitting: Hematology and Oncology

## 2018-10-28 ENCOUNTER — Encounter: Payer: Self-pay | Admitting: Hematology and Oncology

## 2018-10-28 DIAGNOSIS — C778 Secondary and unspecified malignant neoplasm of lymph nodes of multiple regions: Secondary | ICD-10-CM

## 2018-10-28 DIAGNOSIS — Z7189 Other specified counseling: Secondary | ICD-10-CM

## 2018-10-28 DIAGNOSIS — C184 Malignant neoplasm of transverse colon: Secondary | ICD-10-CM

## 2018-10-28 DIAGNOSIS — D509 Iron deficiency anemia, unspecified: Secondary | ICD-10-CM | POA: Diagnosis not present

## 2018-10-28 DIAGNOSIS — C3412 Malignant neoplasm of upper lobe, left bronchus or lung: Secondary | ICD-10-CM | POA: Diagnosis not present

## 2018-10-28 NOTE — Progress Notes (Signed)
No new changes noted today, The patient name and DOB has been verified by phone today.

## 2018-11-03 ENCOUNTER — Other Ambulatory Visit: Payer: Medicare Other

## 2018-11-03 ENCOUNTER — Telehealth: Payer: Self-pay | Admitting: Hematology and Oncology

## 2018-11-03 NOTE — Progress Notes (Signed)
Tumor Board Documentation  Donald Mcdowell. was presented by Dr Mike Gip at our Tumor Board on 11/03/2018, which included representatives from medical oncology, pathology, radiology, surgical, navigation, internal medicine, pulmonology, palliative care, research.  Donald Mcdowell currently presents as a current patient, for [place], for new positive pathology with history of the following treatments: active survellience, adjuvant radiation.  Additionally, we reviewed previous medical and familial history, history of present illness, and recent lab results along with all available histopathologic and imaging studies. The tumor board considered available treatment options and made the following recommendations: Immunotherapy reflex to Irinotecan therapy for progression  The following procedures/referrals were also placed: No orders of the defined types were placed in this encounter.   Clinical Trial Status: not discussed   Staging used: AJCC Stage Group  AJCC Staging:       Group: Stage 4 Colon cancer mets to liver   National site-specific guidelines NCCN were discussed with respect to the case.  Tumor board is a meeting of clinicians from various specialty areas who evaluate and discuss patients for whom a multidisciplinary approach is being considered. Final determinations in the plan of care are those of the provider(s). The responsibility for follow up of recommendations given during tumor board is that of the provider.   Today's extended care, comprehensive team conference, Melanie was not present for the discussion and was not examined.   Multidisciplinary Tumor Board is a multidisciplinary case peer review process.  Decisions discussed in the Multidisciplinary Tumor Board reflect the opinions of the specialists present at the conference without having examined the patient.  Ultimately, treatment and diagnostic decisions rest with the primary provider(s) and the patient.

## 2018-11-03 NOTE — Telephone Encounter (Signed)
Re:  Tumor board discussions  Spoke with [name] about tumor board discussions.  Await Foundation One results regarding options besides standard irinotecan based therapy.  Will discuss with Dr. Baruch Gouty feasibility of local radiation to the nodal mass.  Reviewed that radiation would be palliative.   Lequita Asal, MD

## 2018-11-15 ENCOUNTER — Encounter: Payer: Self-pay | Admitting: Internal Medicine

## 2018-11-15 ENCOUNTER — Encounter: Payer: Self-pay | Admitting: Hematology and Oncology

## 2018-11-18 DIAGNOSIS — C772 Secondary and unspecified malignant neoplasm of intra-abdominal lymph nodes: Secondary | ICD-10-CM | POA: Diagnosis not present

## 2018-11-18 DIAGNOSIS — C184 Malignant neoplasm of transverse colon: Secondary | ICD-10-CM | POA: Diagnosis not present

## 2018-11-25 NOTE — Progress Notes (Signed)
Patient daughter helped get him checked in.  He is doing well no complaints

## 2018-11-28 ENCOUNTER — Inpatient Hospital Stay: Payer: Medicare Other | Attending: Hematology and Oncology | Admitting: Hematology and Oncology

## 2018-11-28 ENCOUNTER — Other Ambulatory Visit: Payer: Self-pay

## 2018-11-28 ENCOUNTER — Encounter: Payer: Self-pay | Admitting: Hematology and Oncology

## 2018-11-28 VITALS — BP 142/58 | HR 89 | Temp 98.5°F | Resp 18 | Ht 71.0 in | Wt 279.4 lb

## 2018-11-28 DIAGNOSIS — Z85118 Personal history of other malignant neoplasm of bronchus and lung: Secondary | ICD-10-CM | POA: Insufficient documentation

## 2018-11-28 DIAGNOSIS — C3412 Malignant neoplasm of upper lobe, left bronchus or lung: Secondary | ICD-10-CM | POA: Diagnosis not present

## 2018-11-28 DIAGNOSIS — C184 Malignant neoplasm of transverse colon: Secondary | ICD-10-CM | POA: Insufficient documentation

## 2018-11-28 DIAGNOSIS — Z7189 Other specified counseling: Secondary | ICD-10-CM | POA: Diagnosis not present

## 2018-11-28 DIAGNOSIS — D509 Iron deficiency anemia, unspecified: Secondary | ICD-10-CM | POA: Diagnosis not present

## 2018-11-28 NOTE — Patient Instructions (Signed)
Irinotecan injection What is this medicine? IRINOTECAN (ir in oh TEE kan ) is a chemotherapy drug. It is used to treat colon and rectal cancer. This medicine may be used for other purposes; ask your health care provider or pharmacist if you have questions. COMMON BRAND NAME(S): Camptosar What should I tell my health care provider before I take this medicine? They need to know if you have any of these conditions:  dehydration  diarrhea  infection (especially a virus infection such as chickenpox, cold sores, or herpes)  liver disease  low blood counts, like low white cell, platelet, or red cell counts  low levels of calcium, magnesium, or potassium in the blood  recent or ongoing radiation therapy  an unusual or allergic reaction to irinotecan, other medicines, foods, dyes, or preservatives  pregnant or trying to get pregnant  breast-feeding How should I use this medicine? This drug is given as an infusion into a vein. It is administered in a hospital or clinic by a specially trained health care professional. Talk to your pediatrician regarding the use of this medicine in children. Special care may be needed. Overdosage: If you think you have taken too much of this medicine contact a poison control center or emergency room at once. NOTE: This medicine is only for you. Do not share this medicine with others. What if I miss a dose? It is important not to miss your dose. Call your doctor or health care professional if you are unable to keep an appointment. What may interact with this medicine? This medicine may interact with the following medications:  antiviral medicines for HIV or AIDS  certain antibiotics like rifampin or rifabutin  certain medicines for fungal infections like itraconazole, ketoconazole, posaconazole, and voriconazole  certain medicines for seizures like carbamazepine, phenobarbital, phenotoin  clarithromycin  gemfibrozil  nefazodone  St. John's  Wort This list may not describe all possible interactions. Give your health care provider a list of all the medicines, herbs, non-prescription drugs, or dietary supplements you use. Also tell them if you smoke, drink alcohol, or use illegal drugs. Some items may interact with your medicine. What should I watch for while using this medicine? Your condition will be monitored carefully while you are receiving this medicine. You will need important blood work done while you are taking this medicine. This drug may make you feel generally unwell. This is not uncommon, as chemotherapy can affect healthy cells as well as cancer cells. Report any side effects. Continue your course of treatment even though you feel ill unless your doctor tells you to stop. In some cases, you may be given additional medicines to help with side effects. Follow all directions for their use. You may get drowsy or dizzy. Do not drive, use machinery, or do anything that needs mental alertness until you know how this medicine affects you. Do not stand or sit up quickly, especially if you are an older patient. This reduces the risk of dizzy or fainting spells. Call your health care professional for advice if you get a fever, chills, or sore throat, or other symptoms of a cold or flu. Do not treat yourself. This medicine decreases your body's ability to fight infections. Try to avoid being around people who are sick. Avoid taking products that contain aspirin, acetaminophen, ibuprofen, naproxen, or ketoprofen unless instructed by your doctor. These medicines may hide a fever. This medicine may increase your risk to bruise or bleed. Call your doctor or health care professional if you notice  any unusual bleeding. Be careful brushing and flossing your teeth or using a toothpick because you may get an infection or bleed more easily. If you have any dental work done, tell your dentist you are receiving this medicine. Do not become pregnant while  taking this medicine or for 6 months after stopping it. Women should inform their health care professional if they wish to become pregnant or think they might be pregnant. Men should not father a child while taking this medicine and for 3 months after stopping it. There is potential for serious side effects to an unborn child. Talk to your health care professional for more information. Do not breast-feed an infant while taking this medicine or for 7 days after stopping it. This medicine has caused ovarian failure in some women. This medicine may make it more difficult to get pregnant. Talk to your health care professional if you are concerned about your fertility. This medicine has caused decreased sperm counts in some men. This may make it more difficult to father a child. Talk to your health care professional if you are concerned about your fertility. What side effects may I notice from receiving this medicine? Side effects that you should report to your doctor or health care professional as soon as possible:  allergic reactions like skin rash, itching or hives, swelling of the face, lips, or tongue  chest pain  diarrhea  flushing, runny nose, sweating during infusion  low blood counts - this medicine may decrease the number of white blood cells, red blood cells and platelets. You may be at increased risk for infections and bleeding.  nausea, vomiting  pain, swelling, warmth in the leg  signs of decreased platelets or bleeding - bruising, pinpoint red spots on the skin, black, tarry stools, blood in the urine  signs of infection - fever or chills, cough, sore throat, pain or difficulty passing urine  signs of decreased red blood cells - unusually weak or tired, fainting spells, lightheadedness Side effects that usually do not require medical attention (report to your doctor or health care professional if they continue or are bothersome):  constipation  hair loss  headache  loss of  appetite  mouth sores  stomach pain This list may not describe all possible side effects. Call your doctor for medical advice about side effects. You may report side effects to FDA at 1-800-FDA-1088. Where should I keep my medicine? This drug is given in a hospital or clinic and will not be stored at home. NOTE: This sheet is a summary. It may not cover all possible information. If you have questions about this medicine, talk to your doctor, pharmacist, or health care provider.  2020 Elsevier/Gold Standard (2018-04-22 10:09:17)

## 2018-11-28 NOTE — Progress Notes (Signed)
[facility]  [address], [address] [place], [place] [address] Phone: [phone]  Fax: [phone]   Clinic Day:  11/28/2018  Referring physician: Hubbard Hartshorn, FNP  Chief Complaint: [name] is a 73 y.o. male with squamous cell lung cancer s/p SBRT and stage IIIB colon cancer who is seen for review of interval testing and discussion regarding direction of therapy.  HPI: The patient was last seen in the medical oncology clinic on 10/28/2018.  At that time, he denies any complaint.  He was asymptomatic regarding the hypermetabolic periportal lymph nodes documented on PET scan.  We reviewed the interval endoscopic ultrasound at [facility].  Pathology confirmed metastatic adenocarcinoma of the colon.  Tissue was sent for [facility] to assess potential treatment options beyond chemotherapy.  PD-L1 testing was 0%.  [facility] testing revealed KRAS G12A, and NRAS wildtype (codons 12, 13, 59, 61, 117 & 146 in exons 2, 3, & 4).  BRAF was negative.  MS-stable.  TMB 1 Mutsz/Mb, APC I1417 fs*3, DIS3 amplification, MRE11A Q459f*5, SMAD4 R361H, SOX9 T2428f10, and TP53 R273H.  During the interim, he denies fevers, chills, or night sweats.   I discussed treatment options with him, namely an irinotecan based regimen. He understands the potential risks and side effects of this treatment. I have requested potential clinical trial options [facility] were briefly discussed. He understands that he has metastatic colon cancer;  treatment is palliative. He will likely require some form of lifelong treatment.  He expressed some concern over whether he will be able to tolerate irinotecan. He also some expresses financial concern over affording the treatments.    Past Medical History:  Diagnosis Date   Abdominal aortic atherosclerosis ([place]05[place]2016   CT scan June 2018   Allergy    [facility]   Coronary artery calcification seen on CAT  scan 09/15/2016   Previous smoker; noted on CT scan June 2018   Diabetes mellitus without complication ([facility]   GERD (gastroesophageal reflux disease)    Gout    Hyperlipidemia    Hypertension    Kidney function abnormal    stage 2   Kidney stones    Obesity 08/20/2015    Past Surgical History:  Procedure Laterality Date   COLONOSCOPY WITH PROPOFOL N/A 11/05/2016   Procedure: COLONOSCOPY WITH PROPOFOL;  Surgeon: WoLucilla LameMD;  [place] [facility] Service: Gastroenterology;  Laterality: N/A;   COLONOSCOPY WITH PROPOFOL N/A 02/15/2018   Procedure: COLONOSCOPY WITH PROPOFOL;  Surgeon: WoLucilla LameMD;  [facility];  Service: Endoscopy;  Laterality: N/A;   ELECTROMAGNETIC NAVIGATION BROCHOSCOPY N/A 12/16/2016   Procedure: ELECTROMAGNETIC NAVIGATION BRONCHOSCOPY;  Surgeon: KaFlora LippsMD;  Location: [facility];  Service: Cardiopulmonary;  Laterality: N/A;   ESOPHAGOGASTRODUODENOSCOPY (EGD) WITH PROPOFOL N/A 11/05/2016   Procedure: ESOPHAGOGASTRODUODENOSCOPY (EGD) WITH PROPOFOL;  Surgeon: WoLucilla LameMD;  [place] [facility] [place] [facility];  Laterality: N/A;  Diabetic - oral meds   kidney stones     PARTIAL COLECTOMY N/A 12/28/2016   Procedure: PARTIAL COLECTOMY RIGHT;  Surgeon: PaJules HusbandsMD;  Location: [facility];  Service: General;  Laterality: N/A;   POLYPECTOMY N/A 11/05/2016   Procedure: POLYPECTOMY;  Surgeon: WoLucilla LameMD;  [place] [facility] Service: Gastroenterology;  Laterality: N/A;   PORTACATH PLACEMENT Right 01/18/2017   Procedure: INSERTION PORT-A-CATH;  Surgeon: PaJules HusbandsMD;  Location: [facility] ORS;  Service: General;  Laterality: Right;  Family History  Problem Relation Age of Onset   Alzheimer's disease Mother    Heart disease Father    Diabetes Father    Cancer Father        Prostate   Cancer Sister        skin cancer    Social History:  reports that he quit smoking about  2 years ago. His smoking use included cigarettes. He has never used smokeless tobacco. He reports that he does not drink alcohol or use drugs. Patient worked for a [employer]. He retired in 1996. He was in his 32s. Patient was a 1-2 pack per day smoker since he was a child. He stopped smoking in 07/2016.He drank alcohol in the past. He has 3 living sisters (1 sister died).He has no children. Patient married to Glasco. Communicate with neighbor [name] [phone].The patient is accompanied by his neighbor, [name], via video call today.  Allergies: No Known Allergies  Current Medications: Current Outpatient Medications  Medication Sig Dispense Refill   ACCU-CHEK FASTCLIX LANCETS MISC Check blood sugars three times daily Dx:E11.21, LON:99 months 100 each 3   allopurinol (ZYLOPRIM) 300 MG tablet Take 1 tablet (300 mg total) by mouth daily. 90 tablet 1   Blood Glucose Monitoring Suppl (ACCU-CHEK GUIDE ME) w/Device KIT 1 Device by Does not apply route 3 (three) times daily. Dx:E11.21, Lon:99 1 kit 0   Cyanocobalamin (VITAMIN B-12 PO) Take 1 tablet by mouth daily.     Ferrous Fumarate (HEMOCYTE - 106 MG FE) 324 (106 Fe) MG TABS tablet Take 1 tablet by mouth.     glipiZIDE (GLIPIZIDE XL) 10 MG 24 hr tablet Take 1 tablet (10 mg total) by mouth daily with breakfast. 90 tablet 0   glucose blood (ACCU-CHEK GUIDE) test strip Check blood sugars three times daily DX:E11.21, LON:99 100 each 12   insulin NPH Human (NOVOLIN N RELION) 100 UNIT/ML injection Inject 0.35 mLs (35 Units total) into the skin daily before breakfast. 10 mL 0   Insulin Pen Needle 31G X 6 MM MISC Use to inject insulin subcutaneously once a day in the morning 100 each 0   Lancets (ACCU-CHEK MULTICLIX) lancets Check fingerstick blood sugars 3x a day; E11.29, LON:99 month 100 each 12   lidocaine-prilocaine (EMLA) cream Apply to affected area once 30 g 3   metFORMIN (GLUCOPHAGE) 1000 MG tablet Take 1 tablet (1,000  mg total) by mouth 2 (two) times daily with a meal. 180 tablet 1   nateglinide (STARLIX) 60 MG tablet Take 1 tablet (60 mg total) by mouth 3 (three) times daily with meals. 270 tablet 0   pantoprazole (PROTONIX) 20 MG tablet Take 1 tablet (20 mg total) by mouth daily. 90 tablet 0   quinapril (ACCUPRIL) 5 MG tablet Take 1 tablet (5 mg total) by mouth daily. 90 tablet 0   rosuvastatin (CRESTOR) 5 MG tablet Take 1 tablet (5 mg total) by mouth daily. 90 tablet 1   saw palmetto 160 MG capsule Take 540 mg by mouth 2 (two) times daily.      tamsulosin (FLOMAX) 0.4 MG CAPS capsule Take 1 capsule (0.4 mg total) by mouth daily. 90 capsule 6   No current facility-administered medications for this visit.    Facility-Administered Medications Ordered in Other Visits  Medication Dose Route Frequency Provider Last Rate Last Dose   heparin lock flush 100 unit/mL  500 Units Intravenous Once Lequita Asal, MD        Review of Systems  Constitutional: Negative.  Negative for chills, diaphoresis, fever, malaise/fatigue and weight loss (up 3 pounds).       Feels "pretty good".  HENT: Negative.  Negative for congestion, ear discharge, nosebleeds, sinus pain and sore throat.   Eyes: Negative.  Negative for blurred vision and double vision.  Respiratory: Positive for shortness of breath (exertional, improved). Negative for cough, hemoptysis, sputum production and wheezing.   Cardiovascular: Negative.  Negative for chest pain, palpitations, orthopnea, leg swelling and PND.  Gastrointestinal: Negative.  Negative for abdominal pain, blood in stool, constipation, diarrhea, heartburn, melena, nausea and vomiting.  Genitourinary: Negative.  Negative for dysuria, frequency, hematuria and urgency.  Musculoskeletal: Negative.  Negative for back pain, falls, joint pain, myalgias and neck pain.  Skin: Negative.  Negative for itching and rash.  Neurological: Negative.  Negative for dizziness, tremors, sensory  change, speech change, focal weakness, weakness and headaches.  Endo/Heme/Allergies: Does not bruise/bleed easily.       Diabetes.  Psychiatric/Behavioral: Positive for memory loss (mild). Negative for depression and substance abuse. The patient is nervous/anxious. The patient does not have insomnia.        He is extremely worried about his diagnosis. He doesn't want to become a burden to his wife.  All other systems reviewed and are negative.  Performance status (ECOG): 1  Vitals Blood pressure (!) 142/58, pulse 89, temperature 98.5 F (36.9 C), temperature source Tympanic, resp. rate 18, height _0  (1.803 m), weight 279 lb 6.9 oz (126.8 kg), SpO2 99 %.   Physical Exam  Constitutional: He is oriented to person, place, and time. He appears well-developed and well-nourished. No distress. Face mask in place.  HENT:  Head: Normocephalic and atraumatic.  Right Ear: Hearing normal.  Left Ear: Hearing normal.  Nose: Nose normal.  Mouth/Throat: Oropharynx is clear and moist and mucous membranes are normal. No oral lesions. No oropharyngeal exudate.  Gray hair.  Male pattern baldness.  Eyes: Pupils are equal, round, and reactive to light. Conjunctivae and EOM are normal. No scleral icterus.  Glasses.  Blue eyes.  Left amblyopia.  Neck: Neck supple. No JVD present.  Cardiovascular: Normal rate, regular rhythm and normal heart sounds. Exam reveals no gallop and no friction rub.  No murmur heard. Pulmonary/Chest: Effort normal and breath sounds normal. He has no wheezes. He has no rhonchi. He has no rales. He exhibits no mass, no tenderness and no edema. Right breast exhibits no mass and no tenderness. Left breast exhibits no mass and no tenderness.  Abdominal: Soft. Normal appearance and bowel sounds are normal. He exhibits no mass. There is no hepatosplenomegaly. There is no abdominal tenderness. There is no rebound and no guarding.  Fully round.  Lymphadenopathy:    He has no cervical  adenopathy.       Right cervical: No superficial cervical adenopathy present.   He has no axillary adenopathy.       Right: No inguinal adenopathy present.       Left: No inguinal adenopathy present.  Neurological: He is alert and oriented to person, place, and time.  Skin: Skin is intact. No bruising, no lesion and no rash noted. He is not diaphoretic. No erythema. No pallor.  Psychiatric: He has a normal mood and affect. His behavior is normal. Judgment and thought content normal.  Nursing note and vitals reviewed.   No visits with results within 3 Day(s) from this visit.  Latest known visit with results is:  Hospital Outpatient Visit on 09/27/2018  Component Date Value Ref Range Status   Glucose-Capillary 09/27/2018 167* 70 - 99 mg/dL Final   Glucose-Capillary 09/27/2018 122* 70 - 99 mg/dL Final    Assessment:  Donald Ing. is a 73 y.o. male with stage IIIB transverse colon cancers/p right partial colectomy on 12/28/2016. Pathology revealed a 5.5 cm grade II invasive adenocarcinoma of the transverse colon, extending through the muscularis propria and into the adjacent adipose tissue, including the greater omentum. There was metastatic adenocarcinoma in multiple lymph nodes and tumor deposits, at least 5 involved with a total of at least 18 lymph nodes (5 of 18). Pathologic stagewas T3N2a.CEAwas 1.4 on 11/09/2016.  Abdomen and pelvic CTon 11/09/2016 revealed mid transverse colon segment of luminal narrowing with wall thickening compatible with colon cancer. There was a single enlarged adjacent mesenteric lymph node and prominent subcentimeter lymph nodes anterior to the pancreas which may be metastatic.   Chest CTon 11/18/2016 revealed no metastatic disease. There was abrupt termination of the left upper lobe beyond which the distal bronchus appeared dilated with mild branching into the lung parenchyma. Imaging suggested a bronchocele in the medial aspect of the left  upper lobe. Differential included an endobronchial polyp, endobronchial tumor, bronchial stricture or aspirated foreign body.   Electromagnetic navigational bronchoscopy (ENB) on 12/16/2016 revealed non-small cell carcinoma, favor squamous cell lung carcinoma. Tumor was positive for p40 and negative for cdx-2 and TTF-1. He received SBRTto the left upper lobe of 5000 cGy in 5 fractions from 02/16/2017 - 03/02/2017.  EGDon 11/05/2016 revealed gastritis. Pathology confirmed chronic mild gastritis and features of a healing erosion in the stomach. There was no H pylori, dysplasia or malignancy. Colonoscopy on 12/03/2019revealedgrade II internal hemorrhoids.There was a single 3 mm polyp in the transverse colon(tubular adenoma).  He received 12 cycles of FOLFOXchemotherapy (03/15/2017 - 09/13/2017).   Chest, abdomen, and pelvisCTon 09/19/2018 revealed a markedly enlarged portacaval lymph node(4.2 x 2.5 x 4.8 cm; previously 2.3 x 1.0 x 1.9 cm)concerning for metastatic adenopathy.There were changes of external beam radiation within the left upper lobe. Increased nodular soft tissue attenuation within the radiation portwas nonspecific in may represent evolutionary changes of external beam radiation. Recurrent tumor would be difficult to exclude as thiswas a new finding from 10/11/2017.He has kidney stones.  PET scanon 09/27/2018 revealed enlarged hypermetabolic periportal lymph nodes (2.6 cm node with SUV 12.9 and an adjacent 1.6 cm node with an SUV of 11.2)consistent with metastatic adenopathy.There was resolution of hypermetabolic nodularity in the left upper lobe with benign-appearing perihilar radiation change.There was no evidence of mediastinal metastatic adenopathy.He is s/pright hemicolectomy anatomywith no evidence of local recurrence.  [facility] on 10/18/2018 at [facility] confirmed metastatic adenocarcinoma c/w clinical history of colorectal adenocarcinoma. IHC was negative for  CK7 and TTF-1 and positive for CK20, CDX-2, and SATB2.   CEAhas been followed: 1.4 on 11/09/2016, 3.9 on 10/14/2017, 2.0 on 01/18/2018, 1.7 on 04/21/2018, 2.4 on 07/19/2018, and 2.8 on 09/19/2018.  He has iron deficiency anemia. He is on oral iron. Ferritin was 6 on 09/04/2016. Hematocrit was 29.7 and hemoglobin 8.8 on 09/04/2016. Hematocrit was 28.1, hemoglobin 9.3, and MCV 87.9 on 11/09/2016.   Ferritinhas been followed: 28 on 10/13/2016, 15 on 01/14/2017, 21 on 03/29/2016, 20 on 04/12/2017, 24 on 04/26/2017, 49 on 07/12/2017, 50 on 08/02/2017, 48 on 08/16/2017, 37 on 01/18/2018, 42 on 04/21/2018, and 44 on 07/19/2018.  [place] 12/15/2016 revealed mild to moderate aortic valve stenosis. There was abnormal LV relaxation consistent with grade I diastolic dysfunction. Estimated EF  was 55-60%.  PD-L1 testing was 0%.  [facility] One testing revealed KRAS G12A, and NRAS wildtype (codons 12, 13, 59, 61, 117 & 146 in exons 2, 3, & 4).  BRAF was negative.  MS-stable.  TMB 1 Mutsz/Mb, APC I1417 fs*3, DIS3 amplification, MRE11A Q464f*5, SMAD4 R361H, SOX9 T2439f10, and TP53 R273H.  Symptomatically, he feels good.  He denies any symptoms.    Plan: 1.  Stage IIIB transverse colon cancer Clinically, he is doing well.             He is asymptomatic regarding the hypermetabolic periportal lymph nodes documented on PET scan.             [facility].              Lymph node conglomerate is not resectable.             Unclear if patient would be a candidate for palliative SBRT as likely microscopic disease outside of lymph node conglomerate.   Consider referral to radiation oncology if patient declines systemic therapy.             Re-review an irinotecan based regimen.   Potential side effects reviewed in detail.   Information provided.  Review results from [facility]   No targetable  mutation.   MMR/MSI and BRAF negative and thus not a candidate for nivolumab +/- ipilumumab AND encorafenib +panitumumab. Discuss contacting [facility] regarding potential treatment options/clinical trial enrollment.  Clinical trials discussed in detail.  Multiple questions asked and answered. Discuss phone follow-up with patient after [facility] consultation. 2.Stage I squamous cell carcinoma of the LEFT lung Clinically, he is doing well.   Pathology from above reveals no evidence of recurrent disease. Continue surveillance. 3.Iron deficiency Hemoglobin 13.1. Hematocrit39.3.MCV97.5 on 09/19/2018. Ferritinwas 44 with an iron saturation of 20% on 07/19/2018. Plan to check ferritin at next lab draw.  Ferritin goal is 100. Patient remains on oral iron. 4. Await follow-up with [facility] reTM:AUQJFHLKrials. 5.   Call patient and/or SuManuela Schwartzegarding treatment options.  Patient requests that I call SuManuela Schwartzirectly. 6.   RTC based on above.  I discussed the assessment and treatment plan with the patient.  The patient was provided an opportunity to ask questions and all were answered.  The patient agreed with the plan and demonstrated an understanding of the instructions.  The patient was advised to call back if the symptoms worsen or if the condition fails to improve as anticipated.  I provided 45 minutes of face-to-face time during this this encounter and > 50% was spent counseling as documented under my assessment and plan.    MeLequita AsalMD, PhD    11/28/2018, 4:08 PM  I, Amber Handy am acting as scEducation administratoror MeCalpine CorporationCoMike GipMD, PhD.  I, Mariyana Fulop C. CoMike GipMD, have reviewed the above documentation for accuracy and completeness, and I agree with the above.

## 2018-12-13 ENCOUNTER — Other Ambulatory Visit: Payer: Self-pay | Admitting: Family Medicine

## 2018-12-13 DIAGNOSIS — E1121 Type 2 diabetes mellitus with diabetic nephropathy: Secondary | ICD-10-CM

## 2018-12-13 NOTE — Telephone Encounter (Signed)
rx refill glipiZIDE (GLIPIZIDE XL) 10 MG 24 hr tablet  [facility] [address], [place] [phone] (Phone) [phone] (Fax)

## 2018-12-14 ENCOUNTER — Ambulatory Visit: Payer: Medicare Other | Admitting: Family Medicine

## 2018-12-15 MED ORDER — GLIPIZIDE ER 10 MG PO TB24: 10.0000 mg | ORAL_TABLET | Freq: Every day | ORAL | 1 refills | Status: DC

## 2018-12-20 ENCOUNTER — Telehealth: Payer: Self-pay

## 2018-12-20 ENCOUNTER — Other Ambulatory Visit: Payer: Self-pay | Admitting: Hematology and Oncology

## 2018-12-20 DIAGNOSIS — C184 Malignant neoplasm of transverse colon: Secondary | ICD-10-CM

## 2018-12-20 NOTE — Telephone Encounter (Signed)
Left a message To see if Donald Mcdowell was ready to make a decision to weather he would like to move forward with treatment.

## 2018-12-20 NOTE — Progress Notes (Signed)
DISCONTINUE ON PATHWAY REGIMEN - Colorectal     A cycle is every 14 days:     Oxaliplatin      Leucovorin      5-Fluorouracil      5-Fluorouracil   **Always confirm dose/schedule in your pharmacy ordering system**  REASON: Disease Progression PRIOR TREATMENT: COS67: mFOLFOX6 q14 Days x 6 Months TREATMENT RESPONSE: Complete Response (CR)    Patient Characteristics: Distant Metastases Tumor Location: Colon Therapeutic Status: Distant Metastases

## 2018-12-20 NOTE — Progress Notes (Signed)
START ON PATHWAY REGIMEN - Colorectal     A cycle is every 14 days:     Bevacizumab-xxxx      Irinotecan      Leucovorin      Fluorouracil      Fluorouracil   **Always confirm dose/schedule in your pharmacy ordering system**  Patient Characteristics: Distant Metastases, Nonsurgical Candidate, KRAS/NRAS Mutation Positive/Unknown (BRAF V600 Wild-Type/Unknown), Standard Cytotoxic Therapy, Second Line Standard Cytotoxic Therapy, Bevacizumab Eligible Tumor Location: Colon Therapeutic Status: Distant Metastases Microsatellite/Mismatch Repair Status: MSS/pMMR BRAF Mutation Status: Wild-Type (no mutation) KRAS/NRAS Mutation Status: Mutation Positive Standard Cytotoxic Line of Therapy: Second Line Standard Cytotoxic Therapy Bevacizumab Eligibility: Eligible Intent of Therapy: Non-Curative / Palliative Intent, Discussed with Patient

## 2018-12-20 NOTE — Telephone Encounter (Signed)
-----   Message from Secundino Ginger sent at 12/16/2018  9:15 AM EDT ----- Contact: [phone] Loma Sousa I just has a long conversation with [name] his neighbor. He still does not know what he wants to do concerning treatment. [name] wants to know if he chooses not to do treatment will Dr Mike Gip still want to see him w/ labs to see his progression and will he still need his port? I will call her back if you just let me know what I need to tell her.

## 2018-12-23 ENCOUNTER — Encounter: Payer: Self-pay | Admitting: Hematology and Oncology

## 2018-12-23 ENCOUNTER — Ambulatory Visit: Payer: Medicare Other | Admitting: Family Medicine

## 2018-12-23 NOTE — Progress Notes (Signed)
No new changes noted today. The patient name and DOB has been verified by phone today. 

## 2018-12-25 NOTE — Progress Notes (Signed)
[facility]  [address] [place], [place] [address] Phone: [phone]  Fax: [phone]   Clinic Day:  12/26/2018   Referring physician: Hubbard Hartshorn, FNP  Chief Complaint: [name] is a 73 y.o. male with squamous cell lung cancer s/p SBRT and stage IIIB colon cancerwho is seen for reassessment and discussion regarding direction of therapy.  HPI: The patient was last seen in the medical oncology clinic on 11/28/2018.  At that time, he felt good. He denied any symptoms.  We discussed treatment options regarding his progressive disease.  We discussed an irinotecan based regimen.  [facility] was contacted regarding potential clinical trial enrollment or other suggestions.  [name], the patient's friend, was called at his request on 12/20/2018 to discuss his decision regarding treatment. At that time, he had not come to a decision.   During the interim, he felt well.   In consultation with the [facility] tumor board and [facility] oncology, I am recommending FOLFIRI +/- Avastin.  I discussed the side effects and risks with him, for chermotherapy including myelosuppression, hand foot syndrome, diarrhea, and mouth sores.  Potential side effects of Avastin were reviewed including hypertension, clotting, bleeding, impaired wound healing and GI perforation. He is extremely hesitant to undergo treatment as he feels well. I discussed weighing quality of life verses extension of life.   I recommended a repeat CT scan to measure tumor progression in the near future. He is agreeable to this, but wants to wait some time.    Past Medical History:  Diagnosis Date  . Abdominal aortic atherosclerosis ([place]) 09/15/2016   CT scan June 2018  . Allergy   . Cancer ([place])   . Coronary artery calcification seen on CAT scan 09/15/2016   Previous smoker; noted on CT scan June 2018  . Diabetes mellitus without complication ([place])   . GERD (gastroesophageal reflux disease)    . Gout   . Hyperlipidemia   . Hypertension   . Kidney function abnormal    stage 2  . Kidney stones   . Obesity 08/20/2015    Past Surgical History:  Procedure Laterality Date  . COLONOSCOPY WITH PROPOFOL N/A 11/05/2016   Procedure: COLONOSCOPY WITH PROPOFOL;  Surgeon: Lucilla Lame, MD;  Location: [place];  Service: Gastroenterology;  Laterality: N/A;  . COLONOSCOPY WITH PROPOFOL N/A 02/15/2018   Procedure: COLONOSCOPY WITH PROPOFOL;  Surgeon: Lucilla Lame, MD;  Location: [facility] ENDOSCOPY;  Service: Endoscopy;  Laterality: N/A;  . ELECTROMAGNETIC NAVIGATION BROCHOSCOPY N/A 12/16/2016   Procedure: ELECTROMAGNETIC NAVIGATION BRONCHOSCOPY;  Surgeon: Flora Lipps, MD;  Location: [facility];  Service: Cardiopulmonary;  Laterality: N/A;  . ESOPHAGOGASTRODUODENOSCOPY (EGD) WITH PROPOFOL N/A 11/05/2016   Procedure: ESOPHAGOGASTRODUODENOSCOPY (EGD) WITH PROPOFOL;  Surgeon: Lucilla Lame, MD;  Location: [place];  Service: Gastroenterology;  Laterality: N/A;  Diabetic - oral meds  . kidney stones    . PARTIAL COLECTOMY N/A 12/28/2016   Procedure: PARTIAL COLECTOMY RIGHT;  Surgeon: Jules Husbands, MD;  Location: [facility];  Service: General;  Laterality: N/A;  . POLYPECTOMY N/A 11/05/2016   Procedure: POLYPECTOMY;  Surgeon: Lucilla Lame, MD;  Location: [place];  Service: Gastroenterology;  Laterality: N/A;  . PORTACATH PLACEMENT Right 01/18/2017   Procedure: INSERTION PORT-A-CATH;  Surgeon: Jules Husbands, MD;  Location: [facility];  Service: General;  Laterality: Right;    Family History  Problem Relation Age of Onset  . Alzheimer's disease Mother   . Heart disease Father   .  Diabetes Father   . Cancer Father        Prostate  . Cancer Sister        skin cancer    Social History:  reports that he quit smoking about 2 years ago. His smoking use included cigarettes. He has never used smokeless tobacco. He reports that he does not drink alcohol or use drugs. Patient  worked for a [employer]. He retired in 1996. He was in his 2s. Patient was a 1-2 pack per day smoker since he was a child. He stopped smoking in 07/2016.He drank alcohol in the past. He has 4 sisters, 3 of which are living.He has no children. Patient's wife is [name]. He communicates regularly with his neighbor [name], who can be contacted at [phone]. The patient is accompanied by his friend, [name], on the [place].   Allergies: No Known Allergies  Current Medications: Current Outpatient Medications  Medication Sig Dispense Refill  . ACCU-CHEK FASTCLIX LANCETS MISC Check blood sugars three times daily Dx:E11.21, LON:99 months 100 each 3  . Blood Glucose Monitoring Suppl (ACCU-CHEK GUIDE ME) w/Device KIT 1 Device by Does not apply route 3 (three) times daily. Dx:E11.21, Lon:99 1 kit 0  . Cyanocobalamin (VITAMIN B-12 PO) Take 1 tablet by mouth daily.    . Ferrous Fumarate (HEMOCYTE - 106 MG FE) 324 (106 Fe) MG TABS tablet Take 1 tablet by mouth.    Marland Kitchen glipiZIDE (GLIPIZIDE XL) 10 MG 24 hr tablet Take 1 tablet (10 mg total) by mouth daily with breakfast. 90 tablet 1  . glucose blood (ACCU-CHEK GUIDE) test strip Check blood sugars three times daily DX:E11.21, LON:99 100 each 12  . Insulin Pen Needle 31G X 6 MM MISC Use to inject insulin subcutaneously once a day in the morning (Patient not taking: Reported on 12/26/2018) 100 each 0  . Lancets (ACCU-CHEK MULTICLIX) lancets Check fingerstick blood sugars 3x a day; E11.29, LON:99 month 100 each 12  . pantoprazole (PROTONIX) 20 MG tablet Take 1 tablet (20 mg total) by mouth daily. 90 tablet 0  . saw palmetto 160 MG capsule Take 540 mg by mouth 2 (two) times daily.     . tamsulosin (FLOMAX) 0.4 MG CAPS capsule Take 1 capsule (0.4 mg total) by mouth daily. 90 capsule 6  . allopurinol (ZYLOPRIM) 300 MG tablet Take 1 tablet (300 mg total) by mouth daily. 90 tablet 1  . insulin NPH Human (NOVOLIN N RELION) 100 UNIT/ML injection Inject 0.38  mLs (38 Units total) into the skin daily before breakfast. 10 mL 0  . metFORMIN (GLUCOPHAGE) 1000 MG tablet Take 1 tablet (1,000 mg total) by mouth 2 (two) times daily with a meal. 180 tablet 1  . nateglinide (STARLIX) 60 MG tablet Take 1 tablet (60 mg total) by mouth 3 (three) times daily with meals. 270 tablet 0  . quinapril (ACCUPRIL) 5 MG tablet Take 1 tablet (5 mg total) by mouth daily. 90 tablet 0  . rosuvastatin (CRESTOR) 5 MG tablet Take 1 tablet (5 mg total) by mouth daily. 90 tablet 1   No current facility-administered medications for this visit.    Facility-Administered Medications Ordered in Other Visits  Medication Dose Route Frequency Provider Last Rate Last Dose  . heparin lock flush 100 unit/mL  500 Units Intravenous Once Lequita Asal, MD        Review of Systems  Constitutional: Negative.  Negative for chills, diaphoresis, fever, malaise/fatigue and weight loss (up 2 pounds).  Feels "fine".  HENT: Negative.  Negative for congestion, ear discharge, ear pain, nosebleeds, sinus pain and sore throat.   Eyes: Negative.  Negative for blurred vision, double vision and photophobia.  Respiratory: Positive for shortness of breath (exertional). Negative for cough, hemoptysis, sputum production and wheezing.   Cardiovascular: Negative.  Negative for chest pain, palpitations, orthopnea, leg swelling and PND.  Gastrointestinal: Negative.  Negative for abdominal pain, blood in stool, constipation, diarrhea, heartburn, melena, nausea and vomiting.  Genitourinary: Negative.  Negative for dysuria, frequency, hematuria and urgency.  Musculoskeletal: Negative.  Negative for back pain, falls, joint pain, myalgias and neck pain.  Skin: Negative.  Negative for itching and rash.  Neurological: Negative.  Negative for dizziness, tremors, sensory change, speech change, focal weakness, weakness and headaches.  Endo/Heme/Allergies: Does not bruise/bleed easily.       Diabetes.   Psychiatric/Behavioral: Positive for memory loss (mild). Negative for depression and substance abuse. The patient is nervous/anxious (reguarding treatment). The patient does not have insomnia.        He remains extremely worried about his diagnosis. He doesn't want to become a burden to his wife.  All other systems reviewed and are negative.  PERFORMANCE STATUS ECOG: 0-1  Vitals Blood pressure (!) 155/64, pulse 87, temperature 98.8 F (37.1 C), temperature source Tympanic, resp. rate 20, weight 278 lb 14.1 oz (126.5 kg), SpO2 98 %.   Physical Exam  Constitutional: He is oriented to person, place, and time. He appears well-developed and well-nourished. No distress.  HENT:  Head: Normocephalic and atraumatic.  Gray hair.  Male pattern baldness.  Mask.  Eyes: Conjunctivae and EOM are normal. No scleral icterus.  Glasses.  Blue eyes.  Left amblyopia.  Neurological: He is alert and oriented to person, place, and time.  Skin: He is not diaphoretic. No pallor.  Psychiatric: He has a normal mood and affect. His behavior is normal. Judgment and thought content normal.  Nursing note and vitals reviewed.   Infusion on 12/26/2018  Component Date Value Ref Range Status  . Hgb A1c MFr Bld 12/26/2018 6.2* 4.8 - 5.6 % Final   Comment: (NOTE) Pre diabetes:          5.7%-6.4% Diabetes:              >6.4% Glycemic control for   <7.0% adults with diabetes   . Mean Plasma Glucose 12/26/2018 131.24  mg/dL Final   Performed at [facility], [place] [address] [place], [place] [address]  . CEA 12/26/2018 4.0  0.0 - 4.7 ng/mL Final   Comment: (NOTE)                             Nonsmokers          <3.9                             Smokers             <5.6 Roche [employer] Electrochemiluminescence Immunoassay (ECLIA) Values obtained with different assay methods or kits cannot be used interchangeably.  Results cannot be interpreted as absolute evidence of the presence or absence of malignant  disease. Performed At: [name] [address] [place], [place] [id number] Rush Farmer MD AG:[id number]     Assessment:  Kaire Stary. is a 73 y.o. male with stage IIIB transverse colon cancers/p right partial colectomy on 12/28/2016. Pathology  revealed a 5.5 cm grade II invasive adenocarcinoma of the transverse colon, extending through the muscularis propria and into the adjacent adipose tissue, including the greater omentum. There was metastatic adenocarcinoma in multiple lymph nodes and tumor deposits, at least 5 involved with a total of at least 18 lymph nodes (5 of 18). Pathologic stagewas T3N2a.CEAwas 1.4 on 11/09/2016.  Abdomen and pelvic CTon 11/09/2016 revealed mid transverse colon segment of luminal narrowing with wall thickening compatible with colon cancer. There was a single enlarged adjacent mesenteric lymph node and prominent subcentimeter lymph nodes anterior to the pancreas which may be metastatic.   Chest CTon 11/18/2016 revealed no metastatic disease. There was abrupt termination of the left upper lobe beyond which the distal bronchus appeared dilated with mild branching into the lung parenchyma. Imaging suggested a bronchocele in the medial aspect of the left upper lobe. Differential included an endobronchial polyp, endobronchial tumor, bronchial stricture or aspirated foreign body.   Electromagnetic navigational bronchoscopy (ENB) on 12/16/2016 revealed non-small cell carcinoma, favor squamous cell lung carcinoma. Tumor was positive for p40 and negative for cdx-2 and TTF-1. He received SBRTto the left upper lobe of 5000 cGy in 5 fractions from 02/16/2017 - 03/02/2017.  EGDon 11/05/2016 revealed gastritis. Pathology confirmed chronic mild gastritis and features of a healing erosion in the stomach. There was no H pylori, dysplasia or malignancy. Colonoscopy on 12/03/2019revealedgrade II internal hemorrhoids.There was a single 3 mm  polyp in the transverse colon(tubular adenoma).  He received 12 cycles of FOLFOXchemotherapy (03/15/2017 - 09/13/2017).   Chest, abdomen, and pelvisCTon 09/19/2018 revealed a markedly enlarged portacaval lymph node(4.2 x 2.5 x 4.8 cm; previously 2.3 x 1.0 x 1.9 cm)concerning for metastatic adenopathy.There were changes of external beam radiation within the left upper lobe. Increased nodular soft tissue attenuation within the radiation portwas nonspecific in may represent evolutionary changes of external beam radiation. Recurrent tumor would be difficult to exclude as thiswas a new finding from 10/11/2017.He has kidney stones.  PET scanon 09/27/2018 revealed enlarged hypermetabolic periportal lymph nodes (2.6 cm node with SUV 12.9 and an adjacent 1.6 cm node with an SUV of 11.2)consistent with metastatic adenopathy.There was resolution of hypermetabolic nodularity in the left upper lobe with benign-appearing perihilar radiation change.There was no evidence of mediastinal metastatic adenopathy.He is s/pright hemicolectomy anatomywith no evidence of local recurrence.  [facility] on 10/18/2018 at [facility] confirmed metastatic adenocarcinoma c/w clinical history of colorectal adenocarcinoma. IHC was negative for CK7 and TTF-1 and positive for CK20, CDX-2, and SATB2.   CEAhas been followed: 1.4 on 11/09/2016, 3.9 on 10/14/2017, 2.0 on 01/18/2018, 1.7 on 04/21/2018, 2.4 on 07/19/2018, and 2.8 on 09/19/2018.  He has iron deficiency anemia. He is on oral iron. Ferritin was 6 on 09/04/2016. Hematocrit was 29.7 and hemoglobin 8.8 on 09/04/2016. Hematocrit was 28.1, hemoglobin 9.3, and MCV 87.9 on 11/09/2016.   Ferritinhas been followed: 28 on 10/13/2016, 15 on 01/14/2017, 21 on 03/29/2016, 20 on 04/12/2017, 24 on 04/26/2017, 49 on 07/12/2017, 50 on 08/02/2017, 48 on 08/16/2017, 37 on 01/18/2018, 42 on 04/21/2018, and 44 on 07/19/2018.  [place] 12/15/2016 revealed mild to moderate  aortic valve stenosis. There was abnormal LV relaxation consistent with grade I diastolic dysfunction. Estimated EF was 55-60%.  PD-L1 testing was 0%.  Foundation One testing revealed KRAS G12A, and NRAS wildtype (codons 12, 13, 59, 61, 117 & 146 in exons 2, 3, & 4).  BRAF was negative.  MS-stable.  TMB 1 Mutsz/Mb, APC [id number] fs*3, DIS3 amplification, MRE11A Q462f*5, SMAD4 R361H, SOX9 [id number],  and TP53 R273H.  Symptomatically, he denies any complaint.  He is hesitant about initiating chemotherapy as he feels good.  Plan: 1.  Stage IIIB transverse colon cancer Clinically, he is doing well.             He remains asymptomatic regarding the hypermetabolic periportal lymph nodes documented on PET scan.              Pathology from endoscopic ultrasound at [facility] confirmed metastatic adenocarcinoma the colon.  Review recommendations from tumor board as well as [facility] onc[facility] for irinotecan based regimen. Discuss FOLFIRI plus or minus Avastin.  Potential side effects of FOLFIRI discussed in detail.  Potential side effects of Avastin discussed in detail. Foundation One revealed no targetable mutation. After an extended discussion, patient wishes to postpone treatment and follow scans. 2.Stage I squamous cell carcinoma of the LEFT lung Clinically, he is doing well. No evidence for recurrent disease. Continue surveillance. 3.Iron deficiency Hematocrit 39.3.  Hemoglobin 13.1. MCV97.5 on 09/19/2018. Ferritinwas 44 with an iron saturation of 20% on 07/19/2018. Check ferritin at next lab draw.  Ferritin goal is 100. Continue oral iron. 4. RTC on 02/14/2019 for MD assessment, labs (CBC with diff, CMP, CEA, ferritin) and port flush.  I discussed the assessment and treatment plan with the patient.  The patient was provided an opportunity to ask questions and all were answered.  The patient agreed with the plan and demonstrated an understanding of the instructions.  The patient was  advised to call back if the symptoms worsen or if the condition fails to improve as anticipated.  I provided 45 minutes of face-to-face time during this this encounter and > 50% was spent counseling as documented under my assessment and plan.    Lequita Asal, MD, PhD    12/26/2018 9:36 PM   I, Jacqualyn Posey, am acting as a Education administrator for [employer]. Mike Gip, MD.   I, Melissa C. Mike Gip, MD, have reviewed the above documentation for accuracy and completeness, and I agree with the above.

## 2018-12-26 ENCOUNTER — Inpatient Hospital Stay: Payer: Medicare Other

## 2018-12-26 ENCOUNTER — Other Ambulatory Visit: Payer: Self-pay

## 2018-12-26 ENCOUNTER — Encounter: Payer: Self-pay | Admitting: Family Medicine

## 2018-12-26 ENCOUNTER — Inpatient Hospital Stay: Payer: Medicare Other | Attending: Hematology and Oncology | Admitting: Hematology and Oncology

## 2018-12-26 ENCOUNTER — Ambulatory Visit (INDEPENDENT_AMBULATORY_CARE_PROVIDER_SITE_OTHER): Payer: Medicare Other | Admitting: Family Medicine

## 2018-12-26 VITALS — BP 122/62 | HR 112 | Temp 97.3°F | Resp 18 | Ht 71.0 in | Wt 277.4 lb

## 2018-12-26 VITALS — BP 155/64 | HR 87 | Temp 98.8°F | Resp 20 | Wt 278.9 lb

## 2018-12-26 DIAGNOSIS — C184 Malignant neoplasm of transverse colon: Secondary | ICD-10-CM | POA: Insufficient documentation

## 2018-12-26 DIAGNOSIS — E119 Type 2 diabetes mellitus without complications: Secondary | ICD-10-CM | POA: Diagnosis not present

## 2018-12-26 DIAGNOSIS — Z87442 Personal history of urinary calculi: Secondary | ICD-10-CM | POA: Diagnosis not present

## 2018-12-26 DIAGNOSIS — Z7189 Other specified counseling: Secondary | ICD-10-CM | POA: Insufficient documentation

## 2018-12-26 DIAGNOSIS — I10 Essential (primary) hypertension: Secondary | ICD-10-CM | POA: Diagnosis not present

## 2018-12-26 DIAGNOSIS — I350 Nonrheumatic aortic (valve) stenosis: Secondary | ICD-10-CM | POA: Diagnosis not present

## 2018-12-26 DIAGNOSIS — Z66 Do not resuscitate: Secondary | ICD-10-CM | POA: Insufficient documentation

## 2018-12-26 DIAGNOSIS — C778 Secondary and unspecified malignant neoplasm of lymph nodes of multiple regions: Secondary | ICD-10-CM | POA: Diagnosis not present

## 2018-12-26 DIAGNOSIS — Z452 Encounter for adjustment and management of vascular access device: Secondary | ICD-10-CM | POA: Diagnosis not present

## 2018-12-26 DIAGNOSIS — E785 Hyperlipidemia, unspecified: Secondary | ICD-10-CM | POA: Insufficient documentation

## 2018-12-26 DIAGNOSIS — Z23 Encounter for immunization: Secondary | ICD-10-CM

## 2018-12-26 DIAGNOSIS — E669 Obesity, unspecified: Secondary | ICD-10-CM | POA: Insufficient documentation

## 2018-12-26 DIAGNOSIS — D509 Iron deficiency anemia, unspecified: Secondary | ICD-10-CM | POA: Insufficient documentation

## 2018-12-26 DIAGNOSIS — E1110 Type 2 diabetes mellitus with ketoacidosis without coma: Secondary | ICD-10-CM

## 2018-12-26 DIAGNOSIS — C3412 Malignant neoplasm of upper lobe, left bronchus or lung: Secondary | ICD-10-CM | POA: Diagnosis not present

## 2018-12-26 DIAGNOSIS — M1000 Idiopathic gout, unspecified site: Secondary | ICD-10-CM

## 2018-12-26 DIAGNOSIS — E1121 Type 2 diabetes mellitus with diabetic nephropathy: Secondary | ICD-10-CM | POA: Diagnosis not present

## 2018-12-26 DIAGNOSIS — I700 Atherosclerosis of aorta: Secondary | ICD-10-CM

## 2018-12-26 DIAGNOSIS — Z85118 Personal history of other malignant neoplasm of bronchus and lung: Secondary | ICD-10-CM | POA: Diagnosis not present

## 2018-12-26 LAB — HEMOGLOBIN A1C
Hgb A1c MFr Bld: 6.2 % — ABNORMAL HIGH (ref 4.8–5.6)
Mean Plasma Glucose: 131.24 mg/dL

## 2018-12-26 MED ORDER — QUINAPRIL HCL 5 MG PO TABS: 5.0000 mg | ORAL_TABLET | Freq: Every day | ORAL | 0 refills | Status: DC

## 2018-12-26 MED ORDER — ROSUVASTATIN CALCIUM 5 MG PO TABS: 5.0000 mg | ORAL_TABLET | Freq: Every day | ORAL | 1 refills | Status: DC

## 2018-12-26 MED ORDER — HEPARIN SOD (PORK) LOCK FLUSH 100 UNIT/ML IV SOLN
500.0000 [IU] | Freq: Once | INTRAVENOUS | Status: AC
  Administered 2018-12-26: 16:00:00 500 [IU] via INTRAVENOUS

## 2018-12-26 MED ORDER — SODIUM CHLORIDE 0.9% FLUSH
10.0000 mL | INTRAVENOUS | Status: DC | PRN
  Administered 2018-12-26: 16:00:00 10 mL via INTRAVENOUS
  Filled 2018-12-26: qty 10

## 2018-12-26 MED ORDER — INSULIN NPH (HUMAN) (ISOPHANE) 100 UNIT/ML ~~LOC~~ SUSP: 38.0000 [IU] | Freq: Every day | SUBCUTANEOUS | 0 refills | Status: DC

## 2018-12-26 MED ORDER — NATEGLINIDE 60 MG PO TABS: 60.0000 mg | ORAL_TABLET | Freq: Three times a day (TID) | ORAL | 0 refills | Status: DC

## 2018-12-26 MED ORDER — METFORMIN HCL 1000 MG PO TABS: 1000.0000 mg | ORAL_TABLET | Freq: Two times a day (BID) | ORAL | 1 refills | Status: DC

## 2018-12-26 MED ORDER — ALLOPURINOL 300 MG PO TABS: 300.0000 mg | ORAL_TABLET | Freq: Every day | ORAL | 1 refills | Status: DC

## 2018-12-26 NOTE — Progress Notes (Signed)
Patient here for follow up. Denies any concerns.  

## 2018-12-26 NOTE — Progress Notes (Signed)
Name: Donald Mcdowell.   MRN: [id number]    DOB: [birth year]   Date:12/26/2018       Progress Note  Subjective  Chief Complaint  Chief Complaint  Patient presents with  . Medication Refill    4 month F/U  . Colon Cancer  . Hypertension    SOB thinks it is coming from chemo and radiation  . Gastroesophageal Reflux  . Gout  . Diabetes    HPI  Colon Cancer/Advanced Care Planning: Seeing Dr. Mike Gip at [facility] location; lengthy discussion regarding end of life care in the future, he is not in pain and does not need referral at this time per his report.  He states that his cancer is untreatable at this time; we discussed which medications he would like to continue and what care he'd like to continue for his other chronic problems.   He does voice his desire to be DNR - this is added to his chart.  He is also advised to bring the paperwork in that he already has completed for DNR, Living Will, HCPOA, POA so that we can put this on file.  Diabetes mellitus type 2 Checking sugars?  yes How often? daily Range (low to high) over last two weeks: 100-150 Checking feet every day/night?  yes Denies: Polyuria, polydipsia, polyphagia, vision changes, or neuropathy. Most recent A1C:  Lab Results  Component Value Date   HGBA1C 5.7 (H) 05/03/2018    We will recheck today. Last CMP Results : is due for repeat today - going to have done with [facility] today Urine Micro UTD? Yes Current Medication Management: Diabetic Medications: 38 units NPH daily.  Reduced to 15m glipizide daily instead of BID.  He is concerned that he needs to go back to BID dosing - we will re-evaluate after A1C. Taking starlix TID. ACEI/ARB: Yes Statin: Yes Aspirin therapy: No  HTN: Under control today; taking 574mQuinapril.  No shortness of breath  HLD: Taking statin therapy, wants to continue this medication.   CKD Stage 2: Avoiding NSAIDs.  Having labs done with Cancer center today.   History of prostate  cancer: Monitored by Dr. StBernardo Heater Still having nocturia.   Patient Active Problem List   Diagnosis Date Noted  . Long-term insulin use (HCCedar Creek02/18/2020  . Benign neoplasm of transverse colon   . Complex renal cyst 09/22/2017  . Thrombocytopenia (HCBelgium06/30/2019  . 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 Uncontrolled type 2 diabetes mellitus with diabetic nephropathy (HCEmerson06/01/2018  . Chemotherapy-induced thrombocytopenia 08/20/2017  . Hyperglycemia 07/18/2017  . Hypomagnesemia 06/24/2017  . Essential hypertension, benign 03/28/2017  . Encounter for antineoplastic chemotherapy 03/15/2017  . Anxiety about health 03/01/2017  . Iron deficiency anemia 01/25/2017  . Malignant neoplasm of upper lobe of left lung (HCMaybeury11/03/2016  . Goals of care, counseling/discussion 01/14/2017  . Malignant neoplasm of ascending colon (HCHernando10/15/2018  . Malignant neoplasm of transverse colon (HCPilot [place]09/12/2016  . Lung nodule 11/23/2016  . Benign neoplasm of descending colon   . Gastritis without bleeding   . Coronary artery calcification seen on CAT scan 09/15/2016  . Cyst, kidney, acquired 09/15/2016  . Abdominal aortic atherosclerosis (HCOrchard Mesa07/05/2016  . Anemia 09/04/2016  . Encounter for medication monitoring 12/23/2015  . Hyperlipidemia LDL goal <70 12/23/2015  . Vitamin D deficiency 09/04/2015  . Morbid obesity (HCWamsutter06/08/2015  . Gout 08/20/2015  . Chronic kidney disease (CKD), stage II (mild) 06/16/2013  .  Bladder calculi 11/21/2011  . Frank hematuria 11/21/2011  . Incomplete bladder emptying 11/21/2011  . BPH with obstruction/lower urinary tract symptoms 11/19/2011  . Nephrolithiasis 11/19/2011  . Acalcerosis 11/19/2011  . Family history of malignant neoplasm of prostate 11/19/2011  . Neoplasm of uncertain behavior of urinary organ 11/19/2011    Past Surgical History:  Procedure Laterality Date  . COLONOSCOPY WITH PROPOFOL N/A 11/05/2016    Procedure: COLONOSCOPY WITH PROPOFOL;  Surgeon: Lucilla Lame, MD;  Location: [place];  Service: Gastroenterology;  Laterality: N/A;  . COLONOSCOPY WITH PROPOFOL N/A 02/15/2018   Procedure: COLONOSCOPY WITH PROPOFOL;  Surgeon: Lucilla Lame, MD;  [facility] END[facility]COPY;  Service: Endoscopy;  Laterality: N/A;  . ELECTROMAGNETIC NAVIGATION BROCHOSCOPY N/A 12/16/2016   Procedure: ELECTROMAGNETIC NAVIGATION BRONCHOSCOPY;  Surgeon: Flora Lipps, MD;  Location: [facility];  Service: Cardiopulmonary;  Laterality: N/A;  . ESOPHAGOGASTRODUODENOSCOPY (EGD) WITH PROPOFOL N/A 11/05/2016   Procedure: ESOPHAGOGASTRODUODENOSCOPY (EGD) WITH PROPOFOL;  Surgeon: Lucilla Lame, MD;  Location: [place];  Service: Gastroenterology;  Laterality: N/A;  Diabetic - oral meds  . kidney stones    . PARTIAL COLECTOMY N/A 12/28/2016   Procedure: PARTIAL COLECTOMY RIGHT;  Surgeon: Jules Husbands, MD;  Location: [facility];  Service: General;  Laterality: N/A;  . POLYPECTOMY N/A 11/05/2016   Procedure: POLYPECTOMY;  Surgeon: Lucilla Lame, MD;  Location: [place];  Service: Gastroenterology;  Laterality: N/A;  . PORTACATH PLACEMENT Right 01/18/2017   Procedure: INSERTION PORT-A-CATH;  Surgeon: Jules Husbands, MD;  Location: [facility];  Service: General;  Laterality: Right;    Family History  Problem Relation Age of Onset  . Alzheimer's disease Mother   . Heart disease Father   . Diabetes Father   . Cancer Father        Prostate  . Cancer Sister        skin cancer    Social History   Socioeconomic History  . Marital status: Married    Spouse name: Not on file  . Number of children: Not on file  . Years of education: Not on file  . Highest education level: Not on file  Occupational History  . Not on file  Social Needs  . Financial resource strain: Not on file  . Food insecurity    Worry: Not on file    Inability: Not on file  . Transportation needs    Medical: Not on file     Non-medical: Not on file  Tobacco Use  . Smoking status: Former Smoker    Types: Cigarettes    Quit date: 06/2016    Years since quitting: 2.5  . Smokeless tobacco: Never Used  Substance and Sexual Activity  . Alcohol use: No    Alcohol/week: 0.0 standard drinks  . Drug use: No  . Sexual activity: Not Currently  Lifestyle  . Physical activity    Days per week: Not on file    Minutes per session: Not on file  . Stress: Not on file  Relationships  . Social Herbalist on phone: Not on file    Gets together: Not on file    Attends religious service: Not on file    Active member of club or organization: Not on file    Attends meetings of clubs or organizations: Not on file    Relationship status: Not on file  . Intimate partner violence    Fear of current or ex partner: Not on file    Emotionally  abused: Not on file    Physically abused: Not on file    Forced sexual activity: Not on file  Other Topics Concern  . Not on file  Social History Narrative  . Not on file     Current Outpatient Medications:  .  ACCU-CHEK FASTCLIX LANCETS MISC, Check blood sugars three times daily Dx:E11.21, LON:99 months, Disp: 100 each, Rfl: 3 .  allopurinol (ZYLOPRIM) 300 MG tablet, Take 1 tablet (300 mg total) by mouth daily., Disp: 90 tablet, Rfl: 1 .  Blood Glucose Monitoring Suppl (ACCU-CHEK GUIDE ME) w/Device KIT, 1 Device by Does not apply route 3 (three) times daily. Dx:E11.21, Lon:99, Disp: 1 kit, Rfl: 0 .  Cyanocobalamin (VITAMIN B-12 PO), Take 1 tablet by mouth daily., Disp: , Rfl:  .  Ferrous Fumarate (HEMOCYTE - 106 MG FE) 324 (106 Fe) MG TABS tablet, Take 1 tablet by mouth., Disp: , Rfl:  .  glipiZIDE (GLIPIZIDE XL) 10 MG 24 hr tablet, Take 1 tablet (10 mg total) by mouth daily with breakfast., Disp: 90 tablet, Rfl: 1 .  glucose blood (ACCU-CHEK GUIDE) test strip, Check blood sugars three times daily DX:E11.21, LON:99, Disp: 100 each, Rfl: 12 .  insulin NPH Human (NOVOLIN N  RELION) 100 UNIT/ML injection, Inject 0.35 mLs (35 Units total) into the skin daily before breakfast., Disp: 10 mL, Rfl: 0 .  Lancets (ACCU-CHEK MULTICLIX) lancets, Check fingerstick blood sugars 3x a day; E11.29, LON:99 month, Disp: 100 each, Rfl: 12 .  metFORMIN (GLUCOPHAGE) 1000 MG tablet, Take 1 tablet (1,000 mg total) by mouth 2 (two) times daily with a meal., Disp: 180 tablet, Rfl: 1 .  nateglinide (STARLIX) 60 MG tablet, Take 1 tablet (60 mg total) by mouth 3 (three) times daily with meals., Disp: 270 tablet, Rfl: 0 .  pantoprazole (PROTONIX) 20 MG tablet, Take 1 tablet (20 mg total) by mouth daily., Disp: 90 tablet, Rfl: 0 .  quinapril (ACCUPRIL) 5 MG tablet, Take 1 tablet (5 mg total) by mouth daily., Disp: 90 tablet, Rfl: 0 .  rosuvastatin (CRESTOR) 5 MG tablet, Take 1 tablet (5 mg total) by mouth daily., Disp: 90 tablet, Rfl: 1 .  saw palmetto 160 MG capsule, Take 540 mg by mouth 2 (two) times daily. , Disp: , Rfl:  .  tamsulosin (FLOMAX) 0.4 MG CAPS capsule, Take 1 capsule (0.4 mg total) by mouth daily., Disp: 90 capsule, Rfl: 6 .  Insulin Pen Needle 31G X 6 MM MISC, Use to inject insulin subcutaneously once a day in the morning (Patient not taking: Reported on 12/26/2018), Disp: 100 each, Rfl: 0 No current facility-administered medications for this visit.   Facility-Administered Medications Ordered in Other Visits:  .  heparin lock flush 100 unit/mL, 500 Units, Intravenous, Once, [place], Drue Second, MD  No Known Allergies  I personally reviewed active problem list, medication list, allergies, notes from last encounter, lab results with the patient/caregiver today.   ROS  Ten systems reviewed and is negative except as mentioned in HPI  Objective  Vitals:   12/26/18 1102  BP: 122/62  Pulse: (!) 112  Resp: 18  Temp: (!) 97.3 F (36.3 C)  TempSrc: Temporal  SpO2: 99%  Weight: 277 lb 6.4 oz (125.8 kg)  Height: 5' 11"  (1.803 m)    Body mass index is 38.69 kg/m.   Physical Exam  Constitutional: Patient appears well-developed and well-nourished. No distress.  HENT: Head: Normocephalic and atraumatic.  Eyes: Conjunctivae and EOM are normal. No scleral icterus.  Neck:  Normal range of motion. Neck supple. No JVD present.  Cardiovascular: Normal rate, regular rhythm and normal heart sounds.  No murmur heard. No BLE edema. Pulmonary/Chest: Effort normal and breath sounds normal. No respiratory distress. Musculoskeletal: Normal range of motion, no joint effusions. No gross deformities Neurological: Pt is alert and oriented to person, place, and time. No cranial nerve deficit. Coordination, balance, strength, speech and gait are normal.  Skin: Skin is warm and dry. No rash noted. No erythema.  Psychiatric: Patient has a normal mood and affect. behavior is normal. Judgment and thought content normal.  No results found for this or any previous visit (from the past 72 hour(s)).   PHQ2/9: Depression screen [facility] 2/9 09/01/2018 05/03/2018 01/28/2018 10/28/2017 08/24/2017  Decreased Interest 0 0 0 0 0  Down, Depressed, Hopeless 0 0 0 0 0  PHQ - 2 Score 0 0 0 0 0  Altered sleeping 0 0 0 - -  Tired, decreased energy 0 0 0 - -  Change in appetite 0 0 0 - -  Feeling bad or failure about yourself  0 0 0 - -  Trouble concentrating 0 0 0 - -  Moving slowly or fidgety/restless 0 0 0 - -  Suicidal thoughts 0 0 0 - -  PHQ-9 Score 0 0 0 - -  Difficult doing work/chores Not difficult at all Not difficult at all Not difficult at all - -  Some recent data might be hidden   PHQ-2/9 Result is negative.    Fall Risk: Fall Risk  12/26/2018 09/01/2018 05/03/2018 01/28/2018 10/28/2017  Falls in the past year? 0 0 0 0 No  Number falls in past yr: 0 0 0 - -  Injury with Fall? 0 0 0 - -   Functional Status Survey: Is the patient deaf or have difficulty hearing?: Yes Does the patient have difficulty seeing, even when wearing glasses/contacts?: Yes Does the patient have  difficulty concentrating, remembering, or making decisions?: No Does the patient have difficulty walking or climbing stairs?: No Does the patient have difficulty dressing or bathing?: No Does the patient have difficulty doing errands alone such as visiting a doctor's office or shopping?: No   Assessment & Plan 1. Advanced care planning/counseling discussion - Lengthy discussion today regarding his fears and concerns around end of life care as he has opted out of ongoing treatment for his colon cancer.  He does not want a palliative care or hospice consult at this time, but is aware that he may message or call me at any time or Dr. Mike Gip at any time for this referral and we will place.  He does desire to be DNR, and this is discussed in detail as below.  2. Do not resuscitate status - Patient voices this preference, he is advised to bring paperwork with him to our next visit or drop off at his convenience so that we may have his legal documentation; also advised to talk with his wife about his wishes.  3. Controlled diabetes mellitus with nephropathy (HCC) - insulin NPH Human (NOVOLIN N RELION) 100 UNIT/ML injection; Inject 0.38 mLs (38 Units total) into the skin daily before breakfast.  Dispense: 10 mL; Refill: 0 - nateglinide (STARLIX) 60 MG tablet; Take 1 tablet (60 mg total) by mouth 3 (three) times daily with meals.  Dispense: 270 tablet; Refill: 0 - metFORMIN (GLUCOPHAGE) 1000 MG tablet; Take 1 tablet (1,000 mg total) by mouth 2 (two) times daily with a meal.  Dispense: 180 tablet; Refill:  1 - POCT glycosylated hemoglobin (Hb A1C)  4. Idiopathic gout, unspecified chronicity, unspecified site - allopurinol (ZYLOPRIM) 300 MG tablet; Take 1 tablet (300 mg total) by mouth daily.  Dispense: 90 tablet; Refill: 1  5. Abdominal aortic atherosclerosis (HCC) - rosuvastatin (CRESTOR) 5 MG tablet; Take 1 tablet (5 mg total) by mouth daily.  Dispense: 90 tablet; Refill: 1  6. Essential  hypertension, benign - quinapril (ACCUPRIL) 5 MG tablet; Take 1 tablet (5 mg total) by mouth daily.  Dispense: 90 tablet; Refill: 0  7. Need for immunization against influenza - Flu Vaccine QUAD High Dose(Fluad)

## 2018-12-27 ENCOUNTER — Telehealth: Payer: Self-pay | Admitting: Family Medicine

## 2018-12-27 LAB — CEA: CEA: 4 ng/mL (ref 0.0–4.7)

## 2018-12-27 NOTE — Telephone Encounter (Addendum)
Please let patient know his A1C is at goal - no changes at this time.

## 2018-12-27 NOTE — Telephone Encounter (Signed)
Patient notified

## 2019-01-30 ENCOUNTER — Other Ambulatory Visit: Payer: Self-pay

## 2019-01-30 DIAGNOSIS — K219 Gastro-esophageal reflux disease without esophagitis: Secondary | ICD-10-CM

## 2019-01-30 MED ORDER — PANTOPRAZOLE SODIUM 20 MG PO TBEC: 20.0000 mg | DELAYED_RELEASE_TABLET | Freq: Every day | ORAL | 0 refills | Status: DC

## 2019-01-30 NOTE — Telephone Encounter (Signed)
Wife states she called this med in last week, but I advised her we did not get until today.  pantoprazole (PROTONIX) 20 MG tablet  [place], [place] - [place] [phone] (Phone) [phone] (Fax)

## 2019-02-12 NOTE — Progress Notes (Signed)
[facility]  [address] [place], [place] [address] Phone: [phone]  Fax: [phone]   Clinic Day:  02/14/2019  Referring physician: Hubbard Hartshorn, FNP  Chief Complaint: Donald Pou. is a 73 y.o. male with squamous cell lung cancer s/p SBRT and stage IIIB colon cancer who is seen for 2 month assessment.   HPI: The patient was last seen in the medical oncology clinic on 12/26/2018. At that time, he denied any complaint.  He was hesitant about initiating chemotherapy as he felt good. CEA was 4.0. Patient continued oral iron.   During the interim, he has felt "pretty good". He notes being short of breath with minimal exertion. He denies any abdominal symptoms. The patient is not interested in chemotherapy at this time.    Past Medical History:  Diagnosis Date  . Abdominal aortic atherosclerosis ([place]) 09/15/2016   CT scan June 2018  . Allergy   . Cancer ([place])   . Coronary artery calcification seen on CAT scan 09/15/2016   Previous smoker; noted on CT scan June 2018  . Diabetes mellitus without complication ([place])   . GERD (gastroesophageal reflux disease)   . Gout   . Hyperlipidemia   . Hypertension   . Kidney function abnormal    stage 2  . Kidney stones   . Obesity 08/20/2015    Past Surgical History:  Procedure Laterality Date  . COLONOSCOPY WITH PROPOFOL N/A 11/05/2016   Procedure: COLONOSCOPY WITH PROPOFOL;  Surgeon: Lucilla Lame, MD;  Location: [place];  Service: Gastroenterology;  Laterality: N/A;  . COLONOSCOPY WITH PROPOFOL N/A 02/15/2018   Procedure: COLONOSCOPY WITH PROPOFOL;  Surgeon: Lucilla Lame, MD;  Location: [facility] ENDOSCOPY;  Service: Endoscopy;  Laterality: N/A;  . ELECTROMAGNETIC NAVIGATION BROCHOSCOPY N/A 12/16/2016   Procedure: ELECTROMAGNETIC NAVIGATION BRONCHOSCOPY;  Surgeon: Flora Lipps, MD;  Location: [facility];  Service: Cardiopulmonary;  Laterality: N/A;  . ESOPHAGOGASTRODUODENOSCOPY (EGD) WITH  PROPOFOL N/A 11/05/2016   Procedure: ESOPHAGOGASTRODUODENOSCOPY (EGD) WITH PROPOFOL;  Surgeon: Lucilla Lame, MD;  Location: [place];  Service: Gastroenterology;  Laterality: N/A;  Diabetic - oral meds  . kidney stones    . PARTIAL COLECTOMY N/A 12/28/2016   Procedure: PARTIAL COLECTOMY RIGHT;  Surgeon: Jules Husbands, MD;  Location: [facility];  Service: General;  Laterality: N/A;  . POLYPECTOMY N/A 11/05/2016   Procedure: POLYPECTOMY;  Surgeon: Lucilla Lame, MD;  Location: [place];  Service: Gastroenterology;  Laterality: N/A;  . PORTACATH PLACEMENT Right 01/18/2017   Procedure: INSERTION PORT-A-CATH;  Surgeon: Jules Husbands, MD;  Location: [facility];  Service: General;  Laterality: Right;    Family History  Problem Relation Age of Onset  . Alzheimer's disease Mother   . Heart disease Father   . Diabetes Father   . Cancer Father        Prostate  . Cancer Sister        skin cancer    Social History:  reports that he quit smoking about 2 years ago. His smoking use included cigarettes. He has never used smokeless tobacco. He reports that he does not drink alcohol or use drugs. Patient worked for a [employer]. He retired in 1996. He was in his 47s. Patient was a 1-2 pack per day smoker since he was a child. He stopped smoking in 07/2016.He drank alcohol in the past. He has 4 sisters, 3 of which are living.He has no children. Patient's wife is [name]. He communicates regularly with  his neighbor [name], who can be contacted at [phone].  The patient is accompanied by his friend, [name], today.  Allergies: No Known Allergies  Current Medications: Current Outpatient Medications  Medication Sig Dispense Refill  . ACCU-CHEK FASTCLIX LANCETS MISC Check blood sugars three times daily Dx:E11.21, LON:99 months 100 each 3  . allopurinol (ZYLOPRIM) 300 MG tablet Take 1 tablet (300 mg total) by mouth daily. 90 tablet 1  . Blood Glucose Monitoring Suppl  (ACCU-CHEK GUIDE ME) w/Device KIT 1 Device by Does not apply route 3 (three) times daily. Dx:E11.21, Lon:99 1 kit 0  . Cyanocobalamin (VITAMIN B-12 PO) Take 1 tablet by mouth daily.    . Ferrous Fumarate (HEMOCYTE - 106 MG FE) 324 (106 Fe) MG TABS tablet Take 1 tablet by mouth.    Marland Kitchen glipiZIDE (GLIPIZIDE XL) 10 MG 24 hr tablet Take 1 tablet (10 mg total) by mouth daily with breakfast. 90 tablet 1  . glucose blood (ACCU-CHEK GUIDE) test strip Check blood sugars three times daily DX:E11.21, LON:99 100 each 12  . insulin NPH Human (NOVOLIN N RELION) 100 UNIT/ML injection Inject 0.38 mLs (38 Units total) into the skin daily before breakfast. 10 mL 0  . Insulin Pen Needle 31G X 6 MM MISC Use to inject insulin subcutaneously once a day in the morning (Patient not taking: Reported on 12/26/2018) 100 each 0  . Lancets (ACCU-CHEK MULTICLIX) lancets Check fingerstick blood sugars 3x a day; E11.29, LON:99 month 100 each 12  . metFORMIN (GLUCOPHAGE) 1000 MG tablet Take 1 tablet (1,000 mg total) by mouth 2 (two) times daily with a meal. 180 tablet 1  . nateglinide (STARLIX) 60 MG tablet Take 1 tablet (60 mg total) by mouth 3 (three) times daily with meals. 270 tablet 0  . pantoprazole (PROTONIX) 20 MG tablet Take 1 tablet (20 mg total) by mouth daily. 90 tablet 0  . quinapril (ACCUPRIL) 5 MG tablet Take 1 tablet (5 mg total) by mouth daily. 90 tablet 0  . rosuvastatin (CRESTOR) 5 MG tablet Take 1 tablet (5 mg total) by mouth daily. 90 tablet 1  . saw palmetto 160 MG capsule Take 540 mg by mouth 2 (two) times daily.     . tamsulosin (FLOMAX) 0.4 MG CAPS capsule Take 1 capsule (0.4 mg total) by mouth daily. 90 capsule 6   No current facility-administered medications for this visit.    Facility-Administered Medications Ordered in Other Visits  Medication Dose Route Frequency Provider Last Rate Last Dose  . heparin lock flush 100 unit/mL  500 Units Intravenous Once Corcoran, Melissa C, MD      . sodium chloride  flush (NS) 0.9 % injection 10 mL  10 mL Intravenous PRN Lequita Asal, MD   10 mL at 02/14/19 1010    Review of Systems  Constitutional: Negative.  Negative for chills, diaphoresis, fever, malaise/fatigue and weight loss (up 8 pounds).       Feels "pretty good".  HENT: Negative.  Negative for congestion, ear discharge, ear pain, nosebleeds, sinus pain and sore throat.   Eyes: Negative.  Negative for blurred vision, double vision and photophobia.  Respiratory: Positive for shortness of breath (minimal exertion). Negative for cough, hemoptysis, sputum production and wheezing.   Cardiovascular: Negative.  Negative for chest pain, palpitations, orthopnea, leg swelling and PND.  Gastrointestinal: Negative.  Negative for abdominal pain, blood in stool, constipation, diarrhea, heartburn, melena, nausea and vomiting.  Genitourinary: Negative.  Negative for dysuria, frequency, hematuria and urgency.  Musculoskeletal: Negative.  Negative for back pain, falls, joint pain, myalgias and neck pain.  Skin: Negative.  Negative for itching and rash.  Neurological: Negative.  Negative for dizziness, tremors, sensory change, speech change, focal weakness, weakness and headaches.  Endo/Heme/Allergies: Does not bruise/bleed easily.       Diabetes.  Psychiatric/Behavioral: Positive for memory loss (mild). Negative for depression and substance abuse. The patient is not nervous/anxious (reguarding treatment) and does not have insomnia.        He remains worried about his diagnosis. He doesn't want to become a burden to his wife.  All other systems reviewed and are negative.  Performance status (ECOG): 0  Vitals Blood pressure (!) 136/54, pulse 81, temperature 97.6 F (36.4 C), temperature source Tympanic, resp. rate 18, height 5' 11"  (1.803 m), weight 285 lb 7.9 oz (129.5 kg), SpO2 100 %.   Physical Exam  Constitutional: He is oriented to person, place, and time. He appears well-developed and  well-nourished. No distress.  HENT:  Head: Normocephalic and atraumatic.  Mouth/Throat: Oropharynx is clear and moist. No oropharyngeal exudate.  Gray hair.  Male pattern baldness.  Mask.  Eyes: Pupils are equal, round, and reactive to light. Conjunctivae and EOM are normal. No scleral icterus.  Glasses.  Blue eyes.  Left amblyopia.  Neck: Normal range of motion. Neck supple. No JVD present.  Cardiovascular: Normal rate, regular rhythm and normal heart sounds.  No murmur heard. Pulmonary/Chest: Effort normal and breath sounds normal. No respiratory distress. He has no wheezes. He has no rales. He exhibits no tenderness.  Abdominal: Soft. Bowel sounds are normal. He exhibits no distension and no mass. There is no abdominal tenderness. There is no rebound and no guarding.  Musculoskeletal: Normal range of motion.        General: No tenderness or edema.  Lymphadenopathy:       Head (right side): No preauricular, no posterior auricular and no occipital adenopathy present.       Head (left side): No preauricular, no posterior auricular and no occipital adenopathy present.    He has no cervical adenopathy.    He has no axillary adenopathy.       Right: No inguinal and no supraclavicular adenopathy present.       Left: No inguinal and no supraclavicular adenopathy present.  Neurological: He is alert and oriented to person, place, and time.  Skin: Skin is warm and dry. No rash noted. He is not diaphoretic. No erythema. No pallor.  Psychiatric: He has a normal mood and affect. His behavior is normal. Judgment and thought content normal.  Nursing note and vitals reviewed.   Infusion on 02/14/2019  Component Date Value Ref Range Status  . Sodium 02/14/2019 134* 135 - 145 mmol/L Final  . Potassium 02/14/2019 4.4  3.5 - 5.1 mmol/L Final  . Chloride 02/14/2019 102  98 - 111 mmol/L Final  . CO2 02/14/2019 23  22 - 32 mmol/L Final  . Glucose, Bld 02/14/2019 184* 70 - 99 mg/dL Final  . BUN  02/14/2019 27* 8 - 23 mg/dL Final  . Creatinine, Ser 02/14/2019 1.14  0.61 - 1.24 mg/dL Final  . Calcium 02/14/2019 9.3  8.9 - 10.3 mg/dL Final  . Total Protein 02/14/2019 7.2  6.5 - 8.1 g/dL Final  . Albumin 02/14/2019 4.0  3.5 - 5.0 g/dL Final  . AST 02/14/2019 35  15 - 41 U/L Final  . ALT 02/14/2019 47* 0 - 44 U/L Final  . Alkaline Phosphatase 02/14/2019  49  38 - 126 U/L Final  . Total Bilirubin 02/14/2019 0.5  0.3 - 1.2 mg/dL Final  . GFR calc non Af Amer 02/14/2019 >60  >60 mL/min Final  . GFR calc Af Amer 02/14/2019 >60  >60 mL/min Final  . Anion gap 02/14/2019 9  5 - 15 Final   Performed at [facility], [address] [place], [place] [address]  . WBC 02/14/2019 5.9  4.0 - 10.5 K/uL Final  . RBC 02/14/2019 4.01* 4.22 - 5.81 MIL/uL Final  . Hemoglobin 02/14/2019 12.6* 13.0 - 17.0 g/dL Final  . HCT 02/14/2019 38.3* 39.0 - 52.0 % Final  . MCV 02/14/2019 95.5  80.0 - 100.0 fL Final  . MCH 02/14/2019 31.4  26.0 - 34.0 pg Final  . MCHC 02/14/2019 32.9  30.0 - 36.0 g/dL Final  . RDW 02/14/2019 13.5  11.5 - 15.5 % Final  . Platelets 02/14/2019 137* 150 - 400 K/uL Final  . nRBC 02/14/2019 0.0  0.0 - 0.2 % Final  . Neutrophils Relative % 02/14/2019 56  % Final  . Neutro Abs 02/14/2019 3.3  1.7 - 7.7 K/uL Final  . Lymphocytes Relative 02/14/2019 35  % Final  . Lymphs Abs 02/14/2019 2.1  0.7 - 4.0 K/uL Final  . Monocytes Relative 02/14/2019 5  % Final  . Monocytes Absolute 02/14/2019 0.3  0.1 - 1.0 K/uL Final  . Eosinophils Relative 02/14/2019 2  % Final  . Eosinophils Absolute 02/14/2019 0.1  0.0 - 0.5 K/uL Final  . Basophils Relative 02/14/2019 1  % Final  . Basophils Absolute 02/14/2019 0.0  0.0 - 0.1 K/uL Final  . Immature Granulocytes 02/14/2019 1  % Final  . Abs Immature Granulocytes 02/14/2019 0.04  0.00 - 0.07 K/uL Final   Performed at [facility], [address], [place], [place] [address]    Assessment:  Donald [place]. is a 73 y.o. male  with stage IIIB transverse colon cancers/p right partial colectomy on 12/28/2016. Pathology revealed a 5.5 cm grade II invasive adenocarcinoma of the transverse colon, extending through the muscularis propria and into the adjacent adipose tissue, including the greater omentum. There was metastatic adenocarcinoma in multiple lymph nodes and tumor deposits, at least 5 involved with a total of at least 18 lymph nodes (5 of 18). Pathologic stagewas T3N2a.CEAwas 1.4 on 11/09/2016.  Abdomen and pelvic CTon 11/09/2016 revealed mid transverse colon segment of luminal narrowing with wall thickening compatible with colon cancer. There was a single enlarged adjacent mesenteric lymph node and prominent subcentimeter lymph nodes anterior to the pancreas which may be metastatic.   Chest CTon 11/18/2016 revealed no metastatic disease. There was abrupt termination of the left upper lobe beyond which the distal bronchus appeared dilated with mild branching into the lung parenchyma. Imaging suggested a bronchocele in the medial aspect of the left upper lobe. Differential included an endobronchial polyp, endobronchial tumor, bronchial stricture or aspirated foreign body.   Electromagnetic navigational bronchoscopy (ENB) on 12/16/2016 revealed non-small cell carcinoma, favor squamous cell lung carcinoma. Tumor was positive for p40 and negative for cdx-2 and TTF-1. He received SBRTto the left upper lobe of 5000 cGy in 5 fractions from 02/16/2017 - 03/02/2017.  EGDon 11/05/2016 revealed gastritis. Pathology confirmed chronic mild gastritis and features of a healing erosion in the stomach. There was no H pylori, dysplasia or malignancy. Colonoscopy on 12/03/2019revealedgrade II internal hemorrhoids.There was a single 3 mm polyp in the transverse colon(tubular adenoma).  He received 12  cycles of FOLFOXchemotherapy (03/15/2017 - 09/13/2017).   Chest, abdomen, and pelvisCTon 09/19/2018 revealed a  markedly enlarged portacaval lymph node(4.2 x 2.5 x 4.8 cm; previously 2.3 x 1.0 x 1.9 cm)concerning for metastatic adenopathy.There were changes of external beam radiation within the left upper lobe. Increased nodular soft tissue attenuation within the radiation portwas nonspecific in may represent evolutionary changes of external beam radiation. Recurrent tumor would be difficult to exclude as thiswas a new finding from 10/11/2017.He has kidney stones.  PET scanon 09/27/2018 revealed enlarged hypermetabolic periportal lymph nodes(2.6 cm node with SUV 12.9 and an adjacent 1.6 cm node with an SUV of 11.2)consistent with metastatic adenopathy.There was resolution of hypermetabolic nodularity in the left upper lobe with benign-appearing perihilar radiation change.There was no evidence of mediastinal metastatic adenopathy.He is s/pright hemicolectomy anatomywith no evidence of local recurrence.  [facility] confirmedmetastatic adenocarcinomac/wclinical history of colorectal adenocarcinoma. IHCwas negative for CK7 and TTF-1 and positive for CK20, CDX-2, and SATB2.   CEAhas been followed: 1.4 on 11/09/2016, 3.9 on 10/14/2017, 2.0 on 01/18/2018, 1.7 on 04/21/2018, 2.4 on 07/19/2018, 2.8 on 09/19/2018, 4.0 on 12/26/2018, and 6.7 on 02/14/2019.  He has iron deficiency anemia. He is on oral iron. Ferritin was 6 on 09/04/2016. Hematocrit was 29.7 and hemoglobin 8.8 on 09/04/2016. Hematocrit was 28.1, hemoglobin 9.3, and MCV 87.9 on 11/09/2016.   Ferritinhas been followed: 28 on 10/13/2016, 15 on 01/14/2017, 21 on 03/29/2016, 20 on 04/12/2017, 24 on 04/26/2017, 49 on 07/12/2017, 50 on 08/02/2017, 48 on 08/16/2017, 37 on 01/18/2018, 42 on 04/21/2018, 44 on 07/19/2018, and 33 on 02/14/2019.  [place] 12/15/2016 revealed mild to moderate aortic valve stenosis. There was abnormal LV relaxation consistent with grade I diastolic dysfunction. Estimated EF was 55-60%.   PD-L1 testing was 0%.  Foundation One testing revealed KRAS G12A, and NRAS wildtype (codons 12, 13, 59, 61, 117 & 146 in exons 2, 3, & 4).  BRAF was negative.  MS-stable.  TMB 1 Mutsz/Mb, APC I1417 fs*3, DIS3 amplification, MRE11A Q479f*5, SMAD4 R361H, SOX9 T2427f10, and TP53 R273H.  Symptomatically, he denies any complaint.  Exam is unremarkable.  Plan: 1.   Labs today: CBC with diff, CMP, CEA, ferritin. 2.   Stage IIIB transverse colon cancer Clinically, he continues to do well. He remains asymptomatic regarding the hypermetabolic periportal lymph nodes documented on PET scan.  [facility]             Review prior recommendations for an irinotecan based regimen.  FOLFIRI +/- Avastin.  Additional options after an irinotecan based regimen briefly discussed   Lonsurf (trifluridine + tipiracil) or Stivarga (regorafenib)  Foundation One revealed no targetable mutation. After additional discussion, patient unsure if he wants any future treatment.  He does not want to schedule follow-up imaging at this time.  He is agreeable to follow-up. 3.Stage I squamous cell carcinoma of the LEFT lung Clinically, he is doing well. He has no evidence of recurrent disease. Continue surveillance. 4.Iron deficiency Hematocrit 38.3.  Hemoglobin 12.6. MCV95.5on 02/14/2019.  Ferritin33. Ferritin goal is 100. Continue oral iron. 5.RTC in 3 months for MD assessment, labs (CBC with diff, CMP, CEA, ferritin), and port flush.  Addendum:  Patient contacted regarding increasing CEA (available after clinic).  I discussed the assessment and treatment plan with the patient.  The patient was provided an opportunity to ask questions and all were answered.  The patient agreed with the plan and demonstrated an understanding of the  instructions.  The patient was advised to call back if the  symptoms worsen or if the condition fails to improve as anticipated.  I provided 18 minutes of face-to-face time during this this encounter and > 50% was spent counseling as documented under my assessment and plan.    Lequita Asal, MD, PhD    02/14/2019, 10:47 AM  I, Selena Batten, am acting as scribe for [employer]. Mike Gip, MD, PhD.  I, Melissa C. Mike Gip, MD, have reviewed the above documentation for accuracy and completeness, and I agree with the above.

## 2019-02-13 ENCOUNTER — Other Ambulatory Visit: Payer: Self-pay

## 2019-02-14 ENCOUNTER — Inpatient Hospital Stay: Payer: Medicare Other

## 2019-02-14 ENCOUNTER — Encounter: Payer: Self-pay | Admitting: Hematology and Oncology

## 2019-02-14 ENCOUNTER — Inpatient Hospital Stay: Payer: Medicare Other | Attending: Hematology and Oncology | Admitting: Hematology and Oncology

## 2019-02-14 VITALS — BP 136/54 | HR 81 | Temp 97.6°F | Resp 18 | Ht 71.0 in | Wt 285.5 lb

## 2019-02-14 DIAGNOSIS — D509 Iron deficiency anemia, unspecified: Secondary | ICD-10-CM

## 2019-02-14 DIAGNOSIS — C3412 Malignant neoplasm of upper lobe, left bronchus or lung: Secondary | ICD-10-CM | POA: Diagnosis not present

## 2019-02-14 DIAGNOSIS — E611 Iron deficiency: Secondary | ICD-10-CM | POA: Diagnosis not present

## 2019-02-14 DIAGNOSIS — Z85118 Personal history of other malignant neoplasm of bronchus and lung: Secondary | ICD-10-CM | POA: Insufficient documentation

## 2019-02-14 DIAGNOSIS — C778 Secondary and unspecified malignant neoplasm of lymph nodes of multiple regions: Secondary | ICD-10-CM

## 2019-02-14 DIAGNOSIS — Z95828 Presence of other vascular implants and grafts: Secondary | ICD-10-CM

## 2019-02-14 DIAGNOSIS — C184 Malignant neoplasm of transverse colon: Secondary | ICD-10-CM

## 2019-02-14 LAB — CBC WITH DIFFERENTIAL/PLATELET
Abs Immature Granulocytes: 0.04 10*3/uL (ref 0.00–0.07)
Basophils Absolute: 0 10*3/uL (ref 0.0–0.1)
Basophils Relative: 1 %
Eosinophils Absolute: 0.1 10*3/uL (ref 0.0–0.5)
Eosinophils Relative: 2 %
HCT: 38.3 % — ABNORMAL LOW (ref 39.0–52.0)
Hemoglobin: 12.6 g/dL — ABNORMAL LOW (ref 13.0–17.0)
Immature Granulocytes: 1 %
Lymphocytes Relative: 35 %
Lymphs Abs: 2.1 10*3/uL (ref 0.7–4.0)
MCH: 31.4 pg (ref 26.0–34.0)
MCHC: 32.9 g/dL (ref 30.0–36.0)
MCV: 95.5 fL (ref 80.0–100.0)
Monocytes Absolute: 0.3 10*3/uL (ref 0.1–1.0)
Monocytes Relative: 5 %
Neutro Abs: 3.3 10*3/uL (ref 1.7–7.7)
Neutrophils Relative %: 56 %
Platelets: 137 10*3/uL — ABNORMAL LOW (ref 150–400)
RBC: 4.01 MIL/uL — ABNORMAL LOW (ref 4.22–5.81)
RDW: 13.5 % (ref 11.5–15.5)
WBC: 5.9 10*3/uL (ref 4.0–10.5)
nRBC: 0 % (ref 0.0–0.2)

## 2019-02-14 LAB — COMPREHENSIVE METABOLIC PANEL
ALT: 47 U/L — ABNORMAL HIGH (ref 0–44)
AST: 35 U/L (ref 15–41)
Albumin: 4 g/dL (ref 3.5–5.0)
Alkaline Phosphatase: 49 U/L (ref 38–126)
Anion gap: 9 (ref 5–15)
BUN: 27 mg/dL — ABNORMAL HIGH (ref 8–23)
CO2: 23 mmol/L (ref 22–32)
Calcium: 9.3 mg/dL (ref 8.9–10.3)
Chloride: 102 mmol/L (ref 98–111)
Creatinine, Ser: 1.14 mg/dL (ref 0.61–1.24)
GFR calc Af Amer: >60 mL/min (ref >60)
GFR calc non Af Amer: >60 mL/min (ref >60)
Glucose, Bld: 184 mg/dL — ABNORMAL HIGH (ref 70–99)
Potassium: 4.4 mmol/L (ref 3.5–5.1)
Sodium: 134 mmol/L — ABNORMAL LOW (ref 135–145)
Total Bilirubin: 0.5 mg/dL (ref 0.3–1.2)
Total Protein: 7.2 g/dL (ref 6.5–8.1)

## 2019-02-14 LAB — FERRITIN: Ferritin: 33 ng/mL (ref 24–336)

## 2019-02-14 MED ORDER — SODIUM CHLORIDE 0.9% FLUSH
10.0000 mL | INTRAVENOUS | Status: DC | PRN
  Administered 2019-02-14: 10 mL via INTRAVENOUS
  Filled 2019-02-14: qty 10

## 2019-02-14 MED ORDER — HEPARIN SOD (PORK) LOCK FLUSH 100 UNIT/ML IV SOLN
500.0000 [IU] | Freq: Once | INTRAVENOUS | Status: AC
  Administered 2019-02-14: 500 [IU] via INTRAVENOUS

## 2019-02-14 NOTE — Progress Notes (Signed)
No new changes noted today 

## 2019-02-15 ENCOUNTER — Telehealth: Payer: Self-pay

## 2019-02-15 LAB — CEA: CEA: 6.7 ng/mL — ABNORMAL HIGH (ref 0.0–4.7)

## 2019-02-15 NOTE — Telephone Encounter (Signed)
-----   Message from Lequita Asal, MD sent at 02/15/2019  9:33 AM EST ----- Regarding: Please call patient with CEA  ----- Message ----- From: Interface, Lab In [place] Sent: 02/14/2019  10:30 AM EST To: Lequita Asal, MD

## 2019-02-15 NOTE — Telephone Encounter (Signed)
Left a message To inform her of the patient CEA results 6.7 which has increased from 1 month ago 4.0. I have informed her to feel free to give the office a call if she has any question or concerns.

## 2019-02-20 ENCOUNTER — Telehealth: Payer: Self-pay

## 2019-02-20 NOTE — Telephone Encounter (Signed)
Inbound call received from [name] requesting CEA results due to patient forgetting results and not being updated in My Chart. Informed her of results. Verbalizes understanding and denies any further questions or concerns.

## 2019-03-03 IMAGING — CT CT CHEST W/ CM
2 of 3 series · 15 of 36 positions shown, 18 images · IV contrast (iopamidol)
Comparison: PET-CT 01/26/2017.  Chest CT 11/18/2016.

CLINICAL DATA: Transverse colon cancer with nodal metastases
diagnosed in 1947 post partial colectomy. Hypermetabolic left upper
lobe pulmonary nodule, non-small cell carcinoma on lung biopsy
12/16/2016, post SBRT.

EXAM:
CT CHEST WITH CONTRAST
TECHNIQUE: Multidetector CT imaging of the chest was performed during
intravenous contrast administration.
CONTRAST:  75mL [id number] IOPAMIDOL (2Z2APF-X26) INJECTION 76%

[Series 2: axial st · axial · 0.81mm/px · z∈[-506,-208]mm · 12 of 177 slices shown, 15 images]
[im 14/177  mediastinal]
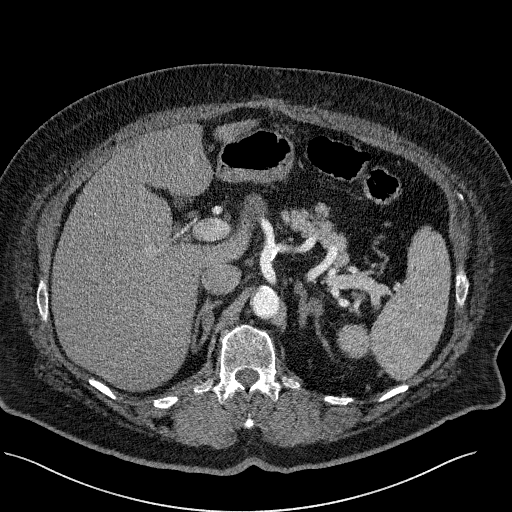
[im 14/177  lung]
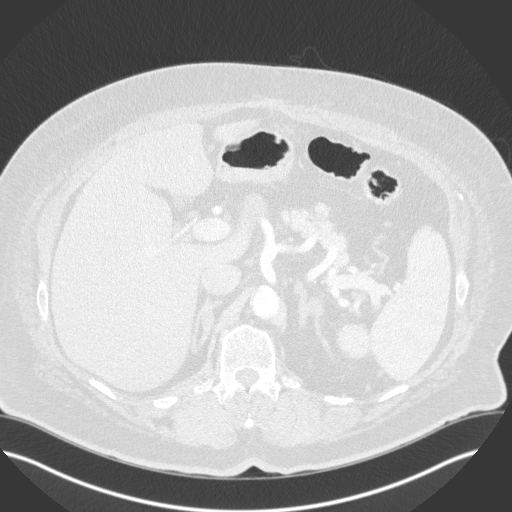
[im 27/177  lung]
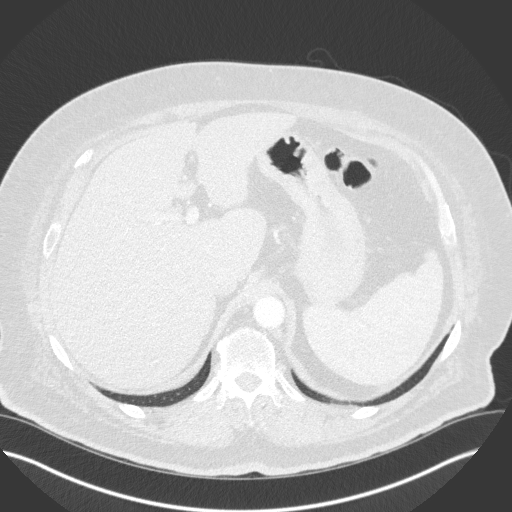
[im 40/177  lung]
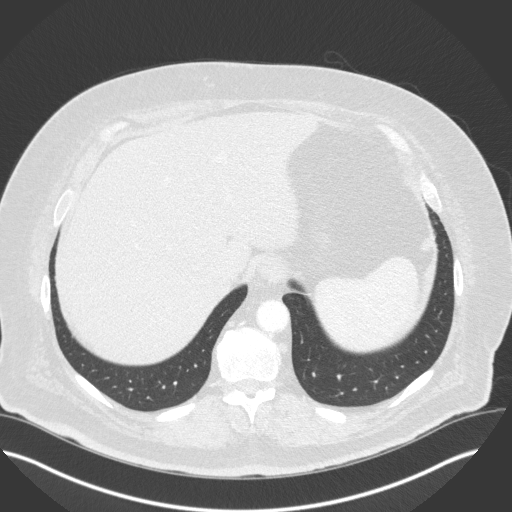
[im 53/177  lung]
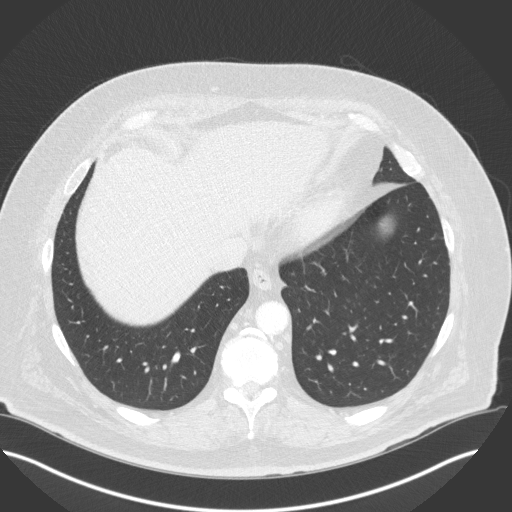
[im 66/177  mediastinal]
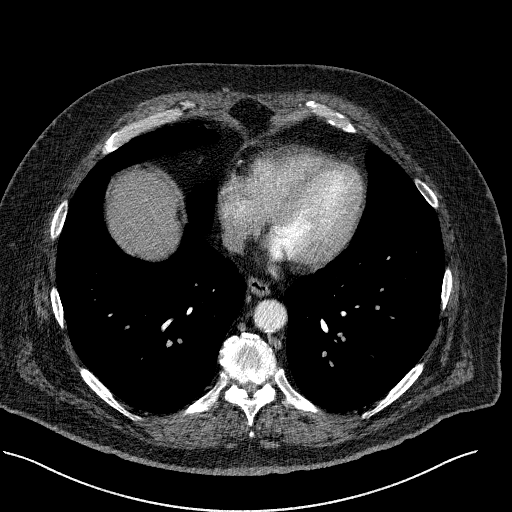
[im 66/177  lung]
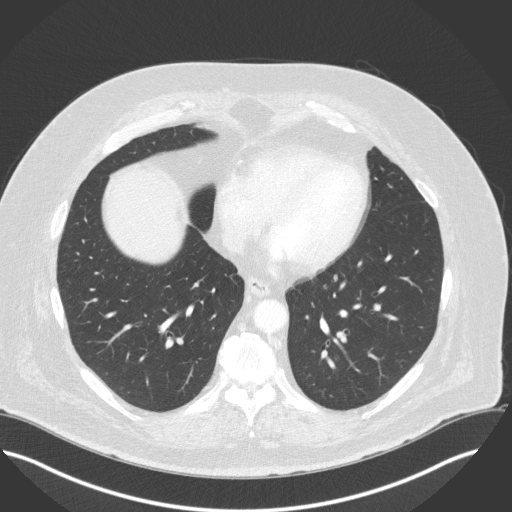
[im 79/177  lung]
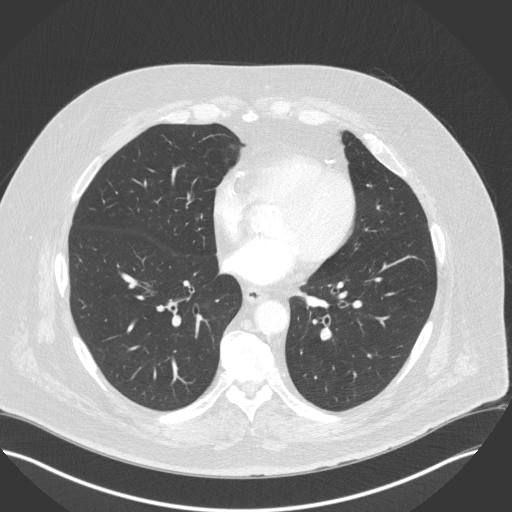
[im 98/177  lung]
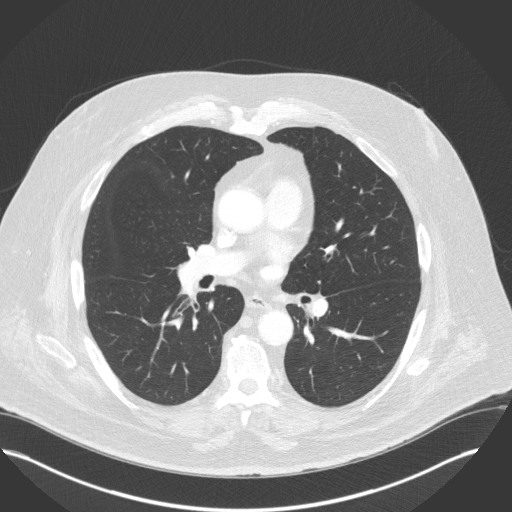
[im 111/177  lung]
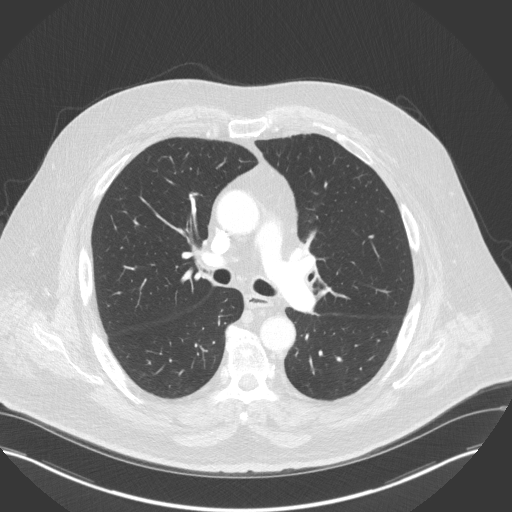
[im 124/177  mediastinal]
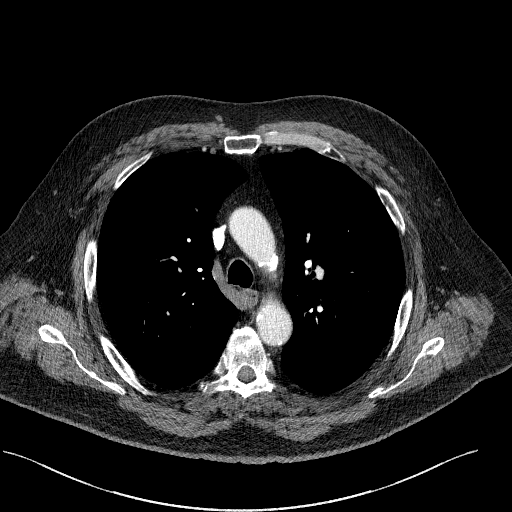
[im 124/177  lung]
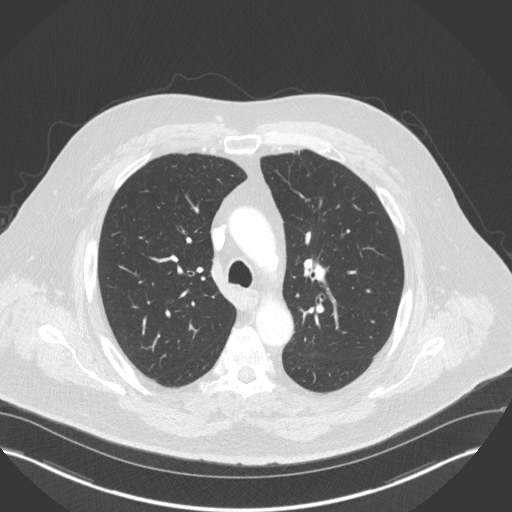
[im 137/177  lung]
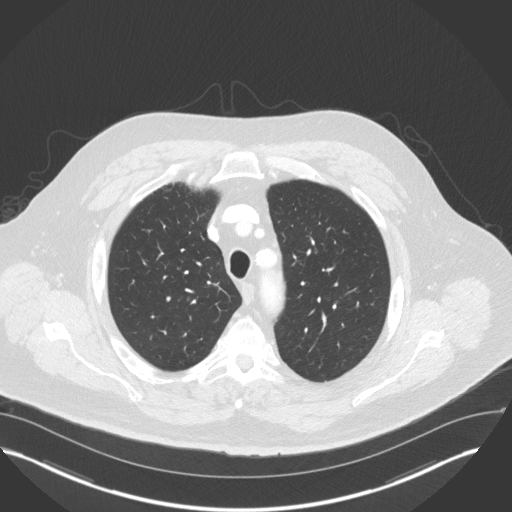
[im 150/177  lung]
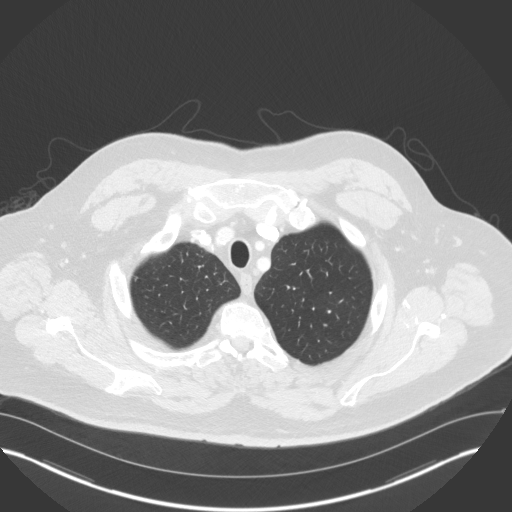
[im 163/177  lung]
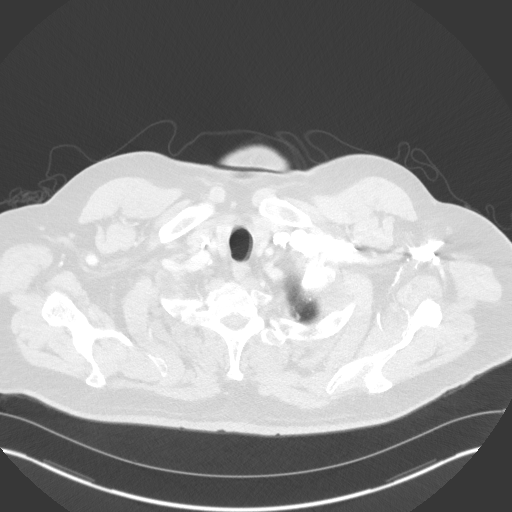

[Series 5: coronal · coronal · 0.73mm/px · 3 of 170 slices shown]
[im 34/170  lung]
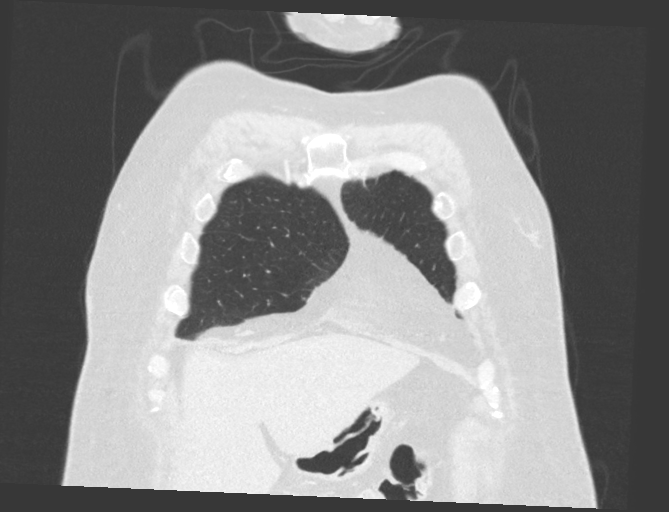
[im 68/170  lung]
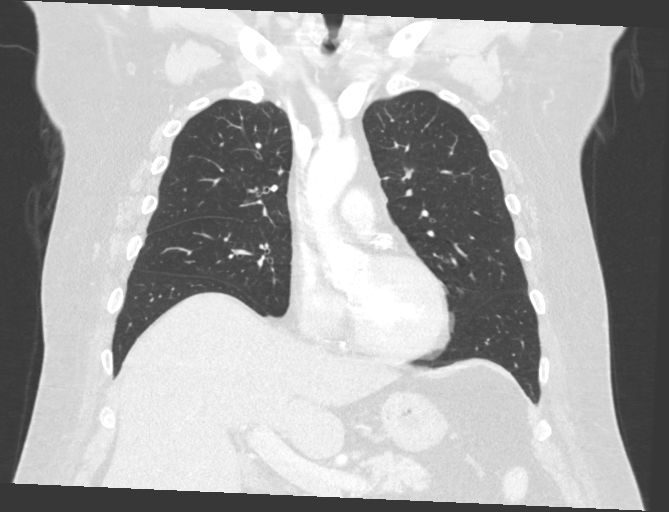
[im 102/170  lung]
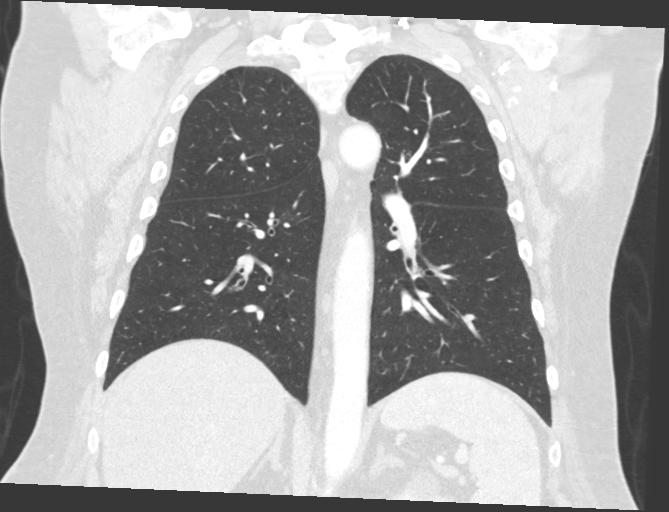

[15 of 36 positions shown; findings below may reference images not displayed]

FINDINGS: Cardiovascular: Extensive atherosclerosis of the aorta, great
vessels and coronary arteries. Probable aortic valvular
calcifications. The heart size is normal. There is no pericardial
effusion. Right IJ Port-A-Cath extends to the superior cavoatrial
junction.

Mediastinum/Nodes: There are no enlarged mediastinal, hilar or
axillary lymph nodes.Small distal paraesophageal nodes are stable.
The thyroid gland, trachea and esophagus demonstrate no significant
findings.

Lungs/Pleura: There is no pleural effusion. No residual anterior
left perihilar upper lobe pulmonary nodule. There is mild central
airway thickening, greatest in the left upper lobe. No new or
enlarging pulmonary nodules.

Upper abdomen: Probable hepatic steatosis. No focal hepatic
abnormality. The adrenal glands appear stable. There is a small cyst
in the upper pole of the left kidney.

Musculoskeletal/Chest wall: There is no chest wall mass or
suspicious osseous finding.
IMPRESSION: 1. No residual left upper lobe pulmonary nodule status post SBRT. No
new pulmonary nodules.
2. No evidence of thoracic metastatic disease.
3.  Aortic Atherosclerosis ([id number]).

## 2019-03-28 ENCOUNTER — Other Ambulatory Visit: Payer: Self-pay | Admitting: Family Medicine

## 2019-03-28 DIAGNOSIS — E1121 Type 2 diabetes mellitus with diabetic nephropathy: Secondary | ICD-10-CM

## 2019-03-28 DIAGNOSIS — K219 Gastro-esophageal reflux disease without esophagitis: Secondary | ICD-10-CM

## 2019-03-28 DIAGNOSIS — I10 Essential (primary) hypertension: Secondary | ICD-10-CM

## 2019-03-28 NOTE — Telephone Encounter (Signed)
Requested Prescriptions  Pending Prescriptions Disp Refills  . quinapril (ACCUPRIL) 5 MG tablet [Pharmacy Med Name: Quinapril HCl 5 MG Oral Tablet] 90 tablet 0    Sig: Take 1 tablet by mouth once daily     Cardiovascular:  ACE Inhibitors Passed - 03/28/2019 10:07 AM      Passed - Cr in normal range and within 180 days    Creat  Date Value Ref Range Status  03/01/2017 1.11 0.70 - 1.18 mg/dL Final    Comment:    For patients >73 years of age, the reference limit for Creatinine is approximately 13% higher for people identified as African-American. .    Creatinine, Ser  Date Value Ref Range Status  02/14/2019 1.14 0.61 - 1.24 mg/dL Final   Creatinine, Urine  Date Value Ref Range Status  05/03/2018 81 20 - 320 mg/dL Final         Passed - K in normal range and within 180 days    Potassium  Date Value Ref Range Status  02/14/2019 4.4 3.5 - 5.1 mmol/L Final  11/23/2011 4.2 3.5 - 5.1 mmol/L Final         Passed - Patient is not pregnant      Passed - Last BP in normal range    BP Readings from Last 1 Encounters:  02/14/19 (!) 136/54         Passed - Valid encounter within last 6 months    Recent Outpatient Visits          3 months ago Advanced care planning/counseling discussion   [place], Astrid Divine, FNP   6 months ago Essential hypertension, benign   [facility], Benjamine Mola E, NP   10 months ago Uncontrolled type 2 diabetes mellitus with diabetic nephropathy [facility])   [facility], Satira Anis, MD   1 year ago Uncontrolled type 2 diabetes mellitus with diabetic nephropathy [facility])   [place], Satira Anis, MD   1 year ago Malignant neoplasm of upper lobe of left lung [facility])   [facility] L[place], Satira Anis, MD      Future Appointments            In 6 months Sto[place], Ronda Fairly, MD [place]           . nateglinide (STARLIX) 60 MG  tablet [Pharmacy Med Name: Nateglinide 60 MG Oral Tablet] 270 tablet 0    Sig: TAKE 1 TABLET BY MOUTH THREE TIMES DAILY WITH MEALS     Endocrinology:  Diabetes - Meglitinides Passed - 03/28/2019 10:07 AM      Passed - HBA1C is between 0 and 7.9 and within 180 days    HbA1c, POC (prediabetic range)  Date Value Ref Range Status  01/28/2018 5.9 5.7 - 6.4 % Final   HbA1c, POC (controlled diabetic range)  Date Value Ref Range Status  01/28/2018 5.9 0.0 - 7.0 % Final   HbA1c POC (<> result, manual entry)  Date Value Ref Range Status  01/28/2018 5.9 4.0 - 5.6 % Final   Hgb A1c MFr Bld  Date Value Ref Range Status  12/26/2018 6.2 (H) 4.8 - 5.6 % Final    Comment:    (NOTE) Pre diabetes:          5.7%-6.4% Diabetes:              >6.4% Glycemic control for   <7.0% adults with diabetes  Passed - Valid encounter within last 6 months    Recent Outpatient Visits          3 months ago Advanced care planning/counseling discussion   [place], [place], FNP   6 months ago Essential hypertension, benign   [place], Benjamine Mola E, NP   10 months ago Uncontrolled type 2 diabetes mellitus with diabetic nephropathy [facility])   [place], Satira Anis, MD   1 year ago Uncontrolled type 2 diabetes mellitus with diabetic nephropathy [facility])   [place], Satira Anis, MD   1 year ago Malignant neoplasm of upper lobe of left lung [facility])   [facility] Lada, Satira Anis, MD      Future Appointments            In 6 months Stoioff, Ronda Fairly, MD [place]           . pantoprazole (PROTONIX) 20 MG tablet [Pharmacy Med Name: Pantoprazole Sodium 20 MG Oral Tablet Delayed Release] 90 tablet 0    Sig: Take 1 tablet by mouth once daily     Gastroenterology: Proton Pump Inhibitors Passed - 03/28/2019 10:07 AM      Passed - Valid encounter within last 12  months    Recent Outpatient Visits          3 months ago Advanced care planning/counseling discussion   [facility] Hubbard Hartshorn, FNP   6 months ago Essential hypertension, benign   [place], Benjamine Mola E, NP   10 months ago Uncontrolled type 2 diabetes mellitus with diabetic nephropathy [facility])   [place], Satira Anis, MD   1 year ago Uncontrolled type 2 diabetes mellitus with diabetic nephropathy [facility])   [place], Satira Anis, MD   1 year ago Malignant neoplasm of upper lobe of left lung [facility])   [facility] Lada, Satira Anis, MD      Future Appointments            In 6 months [place], Ronda Fairly, [place]

## 2019-04-04 ENCOUNTER — Other Ambulatory Visit: Payer: Self-pay | Admitting: Family Medicine

## 2019-04-05 ENCOUNTER — Other Ambulatory Visit: Payer: Self-pay | Admitting: Emergency Medicine

## 2019-04-05 DIAGNOSIS — E1121 Type 2 diabetes mellitus with diabetic nephropathy: Secondary | ICD-10-CM

## 2019-04-05 DIAGNOSIS — IMO0002 Reserved for concepts with insufficient information to code with codable children: Secondary | ICD-10-CM

## 2019-04-06 ENCOUNTER — Other Ambulatory Visit: Payer: Self-pay | Admitting: Family Medicine

## 2019-04-11 ENCOUNTER — Other Ambulatory Visit: Payer: Self-pay

## 2019-04-11 MED ORDER — ACCU-CHEK GUIDE VI STRP: ORAL_STRIP | 3 refills | Status: DC

## 2019-05-02 ENCOUNTER — Other Ambulatory Visit: Payer: Self-pay | Admitting: Family Medicine

## 2019-05-02 DIAGNOSIS — K219 Gastro-esophageal reflux disease without esophagitis: Secondary | ICD-10-CM

## 2019-05-11 NOTE — Progress Notes (Signed)
[facility]  [address] [place], [place] [address] Phone: [phone]  Fax: [phone]   Clinic Day:  05/15/2019  Referring physician: Hubbard Hartshorn, FNP  Chief Complaint: Donald Mcdowell. is a 74 y.o. male with squamous cell lung cancer s/p SBRT and stage IV colon cancer who is seen for a 3 month assessment.  HPI: The patient was last seen in the medical oncology clinic on 02/14/2019. At that time, he denied any complaint. Exam was unremarkable. Hematocrit was 38.3, hemoglobin 12.6, platelets 137,000, WBC 5,900. Ferritin was 33. Sodium was 134. ALT was 47. CEA was 6.7.  We discussed treatment for his recurrent colon cancer.  He declined.  He continued oral iron.   During the interim, he has felt "pretty good". He has shortness of breath. He reports not being able to walk long distances. He has a cough; he denies hemoptysis.  He denies fever.  His PCP advised him to follow up as needed. He denies any B symptoms. He has no bone or joint pain. He has occasional pain in his right leg. His blood sugar was 191 this morning. He is watching his diet.   She reports that he has occasional pain across his back at night. He believes it could be related to his kidney stones.  He denies any flank pain or pain radiating to his groin; he denies any hematuria.  [name] is worried that he may have pneumonia because of his shortness of breath and cough.  He declined CXR and pulmonology referral.   Patient declined treatment today for his metastatic colon cancer.  He reports that he does not like [facility] One.  He may consider a scan at his next 3 month follow up. I encouraged him to call the clinic if he has any issues.   Patient does not plan to receive the COVID-19 vaccine. I encouraged wearing a mask and staying 6 feet apart.    Past Medical History:  Diagnosis Date  . Abdominal aortic atherosclerosis ([place]) 09/15/2016   CT scan June 2018  . Allergy   . Cancer  ([place])   . Coronary artery calcification seen on CAT scan 09/15/2016   Previous smoker; noted on CT scan June 2018  . Diabetes mellitus without complication ([place])   . GERD (gastroesophageal reflux disease)   . Gout   . Hyperlipidemia   . Hypertension   . Kidney function abnormal    stage 2  . Kidney stones   . Obesity 08/20/2015    Past Surgical History:  Procedure Laterality Date  . COLONOSCOPY WITH PROPOFOL N/A 11/05/2016   Procedure: COLONOSCOPY WITH PROPOFOL;  Surgeon: Lucilla Lame, MD;  Location: [place];  Service: Gastroenterology;  Laterality: N/A;  . COLONOSCOPY WITH PROPOFOL N/A 02/15/2018   Procedure: COLONOSCOPY WITH PROPOFOL;  Surgeon: Lucilla Lame, MD;  Location: [facility] ENDOSCOPY;  Service: Endoscopy;  Laterality: N/A;  . ELECTROMAGNETIC NAVIGATION BROCHOSCOPY N/A 12/16/2016   Procedure: ELECTROMAGNETIC NAVIGATION BRONCHOSCOPY;  Surgeon: Flora Lipps, MD;  Location: [facility] ORS;  Service: Cardiopulmonary;  Laterality: N/A;  . ESOPHAGOGASTRODUODENOSCOPY (EGD) WITH PROPOFOL N/A 11/05/2016   Procedure: ESOPHAGOGASTRODUODENOSCOPY (EGD) WITH PROPOFOL;  Surgeon: Lucilla Lame, MD;  Location: [place];  Service: Gastroenterology;  Laterality: N/A;  Diabetic - oral meds  . kidney stones    . PARTIAL COLECTOMY N/A 12/28/2016   Procedure: PARTIAL COLECTOMY RIGHT;  Surgeon: Jules Husbands, MD;  Location: [facility];  Service: General;  Laterality: N/A;  . POLYPECTOMY  N/A 11/05/2016   Procedure: POLYPECTOMY;  Surgeon: Lucilla Lame, MD;  Location: [place];  Service: Gastroenterology;  Laterality: N/A;  . PORTACATH PLACEMENT Right 01/18/2017   Procedure: INSERTION PORT-A-CATH;  Surgeon: Jules Husbands, MD;  Location: [facility];  Service: General;  Laterality: Right;    Family History  Problem Relation Age of Onset  . Alzheimer's disease Mother   . Heart disease Father   . Diabetes Father   . Cancer Father        Prostate  . Cancer Sister        skin cancer     Social History:  reports that he quit smoking about 2 years ago. His smoking use included cigarettes. He has never used smokeless tobacco. He reports that he does not drink alcohol or use drugs. Patient worked for a [employer]. He retired in 1996. He was in his 19s. Patient was a 1-2 pack per day smoker since he was a child. He stopped smoking in 07/2016.He drank alcohol in the past. He has4 sisters,3of which areliving.He has no children. Patient's wife is [name].He communicates regularlywith hisneighbor [name], who can be contacted [phone]. The patient is accompanied by [name] today.  Allergies: No Known Allergies  Current Medications: Current Outpatient Medications  Medication Sig Dispense Refill  . ACCU-CHEK FASTCLIX LANCETS MISC Check blood sugars three times daily Dx:E11.21, LON:99 months 100 each 3  . allopurinol (ZYLOPRIM) 300 MG tablet Take 1 tablet (300 mg total) by mouth daily. 90 tablet 1  . Blood Glucose Monitoring Suppl (ACCU-CHEK GUIDE ME) w/Device KIT 1 Device by Does not apply route 3 (three) times daily. Dx:E11.21, Lon:99 1 kit 0  . Cyanocobalamin (VITAMIN B-12 PO) Take 1 tablet by mouth daily.    . Ferrous Fumarate (HEMOCYTE - 106 MG FE) 324 (106 Fe) MG TABS tablet Take 1 tablet by mouth.    Marland Kitchen glipiZIDE (GLIPIZIDE XL) 10 MG 24 hr tablet Take 1 tablet (10 mg total) by mouth daily with breakfast. 90 tablet 1  . glucose blood (ACCU-CHEK GUIDE) test strip USE 1 STRIP  TO CHECK BLOOD SUGARS THREE TIMES DAILY LON:99 Dx:E11.9 100 each 3  . insulin NPH Human (NOVOLIN N RELION) 100 UNIT/ML injection Inject 0.38 mLs (38 Units total) into the skin daily before breakfast. 10 mL 0  . Lancets (ACCU-CHEK MULTICLIX) lancets Check fingerstick blood sugars 3x a day; E11.29, LON:99 month 100 each 12  . metFORMIN (GLUCOPHAGE) 1000 MG tablet Take 1 tablet (1,000 mg total) by mouth 2 (two) times daily with a meal. 180 tablet 1  . nateglinide (STARLIX) 60 MG  tablet TAKE 1 TABLET BY MOUTH THREE TIMES DAILY WITH MEALS 270 tablet 0  . pantoprazole (PROTONIX) 20 MG tablet Take 1 tablet by mouth once daily 90 tablet 0  . quinapril (ACCUPRIL) 5 MG tablet Take 1 tablet by mouth once daily 90 tablet 0  . rosuvastatin (CRESTOR) 5 MG tablet Take 1 tablet (5 mg total) by mouth daily. 90 tablet 1  . saw palmetto 160 MG capsule Take 540 mg by mouth 2 (two) times daily.     . tamsulosin (FLOMAX) 0.4 MG CAPS capsule Take 1 capsule (0.4 mg total) by mouth daily. 90 capsule 6  . Insulin Pen Needle 31G X 6 MM MISC Use to inject insulin subcutaneously once a day in the morning (Patient not taking: Reported on 12/26/2018) 100 each 0   No current facility-administered medications for this visit.   Facility-Administered Medications Ordered in  Other Visits  Medication Dose Route Frequency Provider Last Rate Last Admin  . heparin lock flush 100 unit/mL  500 Units Intravenous Once Genasis Zingale C, MD      . sodium chloride flush (NS) 0.9 % injection 10 mL  10 mL Intravenous PRN Lequita Asal, MD   10 mL at 05/15/19 0901    Review of Systems  Constitutional: Negative.  Negative for chills, diaphoresis, fever, malaise/fatigue and weight loss (up 3 lbs).       Feels "pretty good".  HENT: Positive for hearing loss. Negative for congestion, ear discharge, ear pain, nosebleeds, sinus pain and sore throat.   Eyes: Negative.  Negative for blurred vision, double vision and photophobia.  Respiratory: Positive for cough and shortness of breath (minimal exertion). Negative for hemoptysis, sputum production and wheezing.   Cardiovascular: Negative.  Negative for chest pain, palpitations, orthopnea, leg swelling and PND.  Gastrointestinal: Negative.  Negative for abdominal pain, blood in stool, constipation, diarrhea, heartburn, melena, nausea and vomiting.       Watching his diet.  Genitourinary: Negative.  Negative for dysuria, frequency, hematuria and urgency.   Musculoskeletal: Positive for back pain (lower back; at night). Negative for falls, joint pain, myalgias (occasional sharp pain in right leg) and neck pain.  Skin: Negative.  Negative for itching and rash.  Neurological: Negative.  Negative for dizziness, tremors, sensory change, speech change, focal weakness, weakness and headaches.  Endo/Heme/Allergies: Does not bruise/bleed easily.       Diabetes.  Psychiatric/Behavioral: Positive for memory loss (mild). Negative for depression and substance abuse. The patient is not nervous/anxious and does not have insomnia.   All other systems reviewed and are negative.  Performance status (ECOG):  1  Vitals Blood pressure (!) 147/71, pulse 77, temperature 97.6 F (36.4 C), temperature source Tympanic, resp. rate 18, height '5\' 11"'$  (1.803 m), weight 288 lb 4.8 oz (130.8 kg), SpO2 99 %.   Physical Exam  Constitutional: He is oriented to person, place, and time. He appears well-developed and well-nourished. No distress.  HENT:  Head: Normocephalic and atraumatic.  Mouth/Throat: Oropharynx is clear and moist. No oropharyngeal exudate.  Gray hair.  Male pattern baldness.  Mask.  Eyes: Pupils are equal, round, and reactive to light. Conjunctivae and EOM are normal. No scleral icterus.  Glasses.  Blue eyes.  Left amblyopia.  Neck: No JVD present.  Cardiovascular: Normal rate, regular rhythm and normal heart sounds.  No murmur heard. Pulmonary/Chest: Effort normal and breath sounds normal. No respiratory distress. He has no wheezes. He has no rales. He exhibits no tenderness.  Abdominal: Soft. Bowel sounds are normal. He exhibits no distension and no mass. There is no abdominal tenderness. There is no rebound and no guarding.  Musculoskeletal:        General: Tenderness (right lower anterior extremity;  no palpable cords or Homan's sign) present. No edema. Normal range of motion.     Cervical back: Normal range of motion and neck supple.   Lymphadenopathy:       Head (right side): No preauricular, no posterior auricular and no occipital adenopathy present.       Head (left side): No preauricular, no posterior auricular and no occipital adenopathy present.    He has no cervical adenopathy.    He has no axillary adenopathy.       Right: No inguinal and no supraclavicular adenopathy present.       Left: No inguinal and no supraclavicular adenopathy present.  Neurological: He  is alert and oriented to person, place, and time.  Skin: Skin is warm and dry. No rash noted. He is not diaphoretic. No erythema. No pallor.  Psychiatric: He has a normal mood and affect. His behavior is normal. Judgment and thought content normal.  Nursing note and vitals reviewed.   Infusion on 05/15/2019  Component Date Value Ref Range Status  . Sodium 05/15/2019 134* 135 - 145 mmol/L Final  . Potassium 05/15/2019 4.1  3.5 - 5.1 mmol/L Final  . Chloride 05/15/2019 101  98 - 111 mmol/L Final  . CO2 05/15/2019 22  22 - 32 mmol/L Final  . Glucose, Bld 05/15/2019 181* 70 - 99 mg/dL Final   Glucose reference range applies only to samples taken after fasting for at least 8 hours.  . BUN 05/15/2019 23  8 - 23 mg/dL Final  . Creatinine, Ser 05/15/2019 1.16  0.61 - 1.24 mg/dL Final  . Calcium 05/15/2019 9.1  8.9 - 10.3 mg/dL Final  . Total Protein 05/15/2019 7.4  6.5 - 8.1 g/dL Final  . Albumin 05/15/2019 3.8  3.5 - 5.0 g/dL Final  . AST 05/15/2019 26  15 - 41 U/L Final  . ALT 05/15/2019 36  0 - 44 U/L Final  . Alkaline Phosphatase 05/15/2019 56  38 - 126 U/L Final  . Total Bilirubin 05/15/2019 0.5  0.3 - 1.2 mg/dL Final  . GFR calc non Af Amer 05/15/2019 >60  >60 mL/min Final  . GFR calc Af Amer 05/15/2019 >60  >60 mL/min Final  . Anion gap 05/15/2019 11  5 - 15 Final   Performed at [facility], [address], [place], [place] [address]  . WBC 05/15/2019 4.7  4.0 - 10.5 K/uL Final  . RBC 05/15/2019 4.05* 4.22 - 5.81 MIL/uL Final  .  Hemoglobin 05/15/2019 12.1* 13.0 - 17.0 g/dL Final  . HCT 05/15/2019 37.5* 39.0 - 52.0 % Final  . MCV 05/15/2019 92.6  80.0 - 100.0 fL Final  . MCH 05/15/2019 29.9  26.0 - 34.0 pg Final  . MCHC 05/15/2019 32.3  30.0 - 36.0 g/dL Final  . RDW 05/15/2019 13.6  11.5 - 15.5 % Final  . Platelets 05/15/2019 139* 150 - 400 K/uL Final  . nRBC 05/15/2019 0.0  0.0 - 0.2 % Final  . Neutrophils Relative % 05/15/2019 53  % Final  . Neutro Abs 05/15/2019 2.5  1.7 - 7.7 K/uL Final  . Lymphocytes Relative 05/15/2019 35  % Final  . Lymphs Abs 05/15/2019 1.6  0.7 - 4.0 K/uL Final  . Monocytes Relative 05/15/2019 7  % Final  . Monocytes Absolute 05/15/2019 0.3  0.1 - 1.0 K/uL Final  . Eosinophils Relative 05/15/2019 4  % Final  . Eosinophils Absolute 05/15/2019 0.2  0.0 - 0.5 K/uL Final  . Basophils Relative 05/15/2019 1  % Final  . Basophils Absolute 05/15/2019 0.0  0.0 - 0.1 K/uL Final  . Immature Granulocytes 05/15/2019 0  % Final  . Abs Immature Granulocytes 05/15/2019 0.02  0.00 - 0.07 K/uL Final   Performed at [facility], [address], [place], [place] [address]    Assessment:  Donald Mcdowell. is a 74 y.o. male with stage IIIB transverse colon cancers/p right partial colectomy on 12/28/2016. Pathology revealed a 5.5 cm grade II invasive adenocarcinoma of the transverse colon, extending through the muscularis propria and into the adjacent adipose tissue, including the greater omentum. There was metastatic adenocarcinoma in multiple lymph nodes and  tumor deposits, at least 5 involved with a total of at least 18 lymph nodes (5 of 18). Pathologic stagewas T3N2a.CEAwas 1.4 on 11/09/2016.  Abdomen and pelvic CTon 11/09/2016 revealed mid transverse colon segment of luminal narrowing with wall thickening compatible with colon cancer. There was a single enlarged adjacent mesenteric lymph node and prominent subcentimeter lymph nodes anterior to the pancreas which may be metastatic.    Chest CTon 11/18/2016 revealed no metastatic disease. There was abrupt termination of the left upper lobe beyond which the distal bronchus appeared dilated with mild branching into the lung parenchyma. Imaging suggested a bronchocele in the medial aspect of the left upper lobe. Differential included an endobronchial polyp, endobronchial tumor, bronchial stricture or aspirated foreign body.   Electromagnetic navigational bronchoscopy (ENB) on 12/16/2016 revealed non-small cell carcinoma, favor squamous cell lung carcinoma. Tumor was positive for p40 and negative for cdx-2 and TTF-1. He received SBRTto the left upper lobe of 5000 cGy in 5 fractions from 02/16/2017 - 03/02/2017.  EGDon 11/05/2016 revealed gastritis. Pathology confirmed chronic mild gastritis and features of a healing erosion in the stomach. There was no H pylori, dysplasia or malignancy. Colonoscopy on 12/03/2019revealedgrade II internal hemorrhoids.There was a single 3 mm polyp in the transverse colon(tubular adenoma).  He received 12 cycles of FOLFOXchemotherapy (03/15/2017 - 09/13/2017).   Chest, abdomen, and pelvisCTon 09/19/2018 revealed a markedly enlarged portacaval lymph node(4.2 x 2.5 x 4.8 cm; previously 2.3 x 1.0 x 1.9 cm)concerning for metastatic adenopathy.There were changes of external beam radiation within the left upper lobe. Increased nodular soft tissue attenuation within the radiation portwas nonspecific in may represent evolutionary changes of external beam radiation. Recurrent tumor would be difficult to exclude as thiswas a new finding from 10/11/2017.He has kidney stones.  PET scanon 09/27/2018 revealed enlarged hypermetabolic periportal lymph nodes(2.6 cm node with SUV 12.9 and an adjacent 1.6 cm node with an SUV of 11.2)consistent with metastatic adenopathy.There was resolution of hypermetabolic nodularity in the left upper lobe with benign-appearing perihilar radiation  change.There was no evidence of mediastinal metastatic adenopathy.He is s/pright hemicolectomy anatomywith no evidence of local recurrence.  [facility] confirmedmetastatic adenocarcinomac/wclinical history of colorectal adenocarcinoma. IHCwas negative for CK7 and TTF-1 and positive for CK20, CDX-2, and SATB2.   CEAhas been followed: 1.4 on 11/09/2016, 3.9 on 10/14/2017, 2.0 on 01/18/2018, 1.7 on 04/21/2018, 2.4 on 07/19/2018, 2.8 on 09/19/2018, 4.0 on 12/26/2018, and 6.7 on 02/14/2019.  He has iron deficiency anemia. He is on oral iron. Ferritin was 6 on 09/04/2016. Hematocrit was 29.7 and hemoglobin 8.8 on 09/04/2016. Hematocrit was 28.1, hemoglobin 9.3, and MCV 87.9 on 11/09/2016.   Ferritinhas been followed: 28 on 10/13/2016, 15 on 01/14/2017, 21 on 03/29/2016, 20 on 04/12/2017, 24 on 04/26/2017, 49 on 07/12/2017, 50 on 08/02/2017, 48 on 08/16/2017, 37 on 01/18/2018, 42 on 04/21/2018, 44 on 07/19/2018, 33 on 02/14/2019, and 32 on 05/15/2019.  [place] 12/15/2016 revealed mild to moderate aortic valve stenosis. There was abnormal LV relaxation consistent with grade I diastolic dysfunction. Estimated EF was 55-60%.  PD-L1 testingwas 0%. [facility] One testing revealed KRAS G12A, and NRAS wildtype (codons 12, 13, 59, 61, 117 &146 in exons 2, 3, &4). BRAF was negative. MS-stable. TMB 1 Mutsz/Mb, APC I1417 fs*3, DIS3 amplification, MRE11A Q432f*5, SMAD4 R361H, SOX9 T2452f10, and TP53 R273H.  Symptomatically, he has chronic shortness of breath and cough.  He denies any pain.  Exam is stable.  Plan: 1.   Labs today: CBC with diff, CMP, CEA, ferritin. 2.  Stage IIIB transverse colon cancer Clinically, he appears to be doing well. He is asymptomatic regarding the hypermetabolic periportal lymph nodes documented on PET scan.  Pathology from [facility] confirmed metastatic adenocarcinoma the colon.     Foundation Onerevealedno targetable mutation. Discuss consideration of restaging studies (last 09/27/2018)- patient declines. Discuss consideration of treatment- patient declines.  Previously discussed FOLFIRI.  Patient not interested as his disease is not curable. Patient agreeable to follow-up visit in 3 months. 3.Stage I squamous cell carcinoma of the LEFT lung Clinically, he appears to be doing well. No imaging since 09/2018. 4.Shortness of breath  Patient has chronic shortness of breath and cough.  Patient not interested in CXR today.  Patient not interested in pulmonary referral.  Encourage follow-up with PCP. 5.   Iron deficiency Hematocrit 37.2.Hemoglobin 12.1. MCV92.6.             Ferritin32. Ferritin goal is 100. Continue oral iron 6.RTC in 3 months for MD assessment, labs (CBC with diff, CMP, CEA, ferritin) and port flush.  I discussed the assessment and treatment plan with the patient.  The patient was provided an opportunity to ask questions and all were answered.  The patient agreed with the plan and demonstrated an understanding of the instructions.  The patient was advised to call back if the symptoms worsen or if the condition fails to improve as anticipated.  I provided 19 minutes of face-to-face time during this this encounter and > 50% was spent counseling as documented under my assessment and plan.    Lequita Asal, MD, PhD    05/15/2019, 9:39 AM  I, Selena Batten, am acting as scribe for [employer]. Mike Gip, MD, PhD.  I, Naydene Kamrowski C. Mike Gip, MD, have reviewed the above documentation for accuracy and completeness, and I agree with the above.

## 2019-05-15 ENCOUNTER — Other Ambulatory Visit: Payer: Medicare Other

## 2019-05-15 ENCOUNTER — Other Ambulatory Visit: Payer: Self-pay

## 2019-05-15 ENCOUNTER — Encounter: Payer: Self-pay | Admitting: Hematology and Oncology

## 2019-05-15 ENCOUNTER — Inpatient Hospital Stay: Payer: Medicare Other

## 2019-05-15 ENCOUNTER — Inpatient Hospital Stay: Payer: Medicare Other | Attending: Hematology and Oncology | Admitting: Hematology and Oncology

## 2019-05-15 VITALS — BP 147/71 | HR 77 | Temp 97.6°F | Resp 18 | Ht 71.0 in | Wt 288.3 lb

## 2019-05-15 DIAGNOSIS — C184 Malignant neoplasm of transverse colon: Secondary | ICD-10-CM | POA: Diagnosis not present

## 2019-05-15 DIAGNOSIS — R05 Cough: Secondary | ICD-10-CM | POA: Diagnosis not present

## 2019-05-15 DIAGNOSIS — R0602 Shortness of breath: Secondary | ICD-10-CM | POA: Diagnosis not present

## 2019-05-15 DIAGNOSIS — C3412 Malignant neoplasm of upper lobe, left bronchus or lung: Secondary | ICD-10-CM | POA: Diagnosis not present

## 2019-05-15 DIAGNOSIS — Z85118 Personal history of other malignant neoplasm of bronchus and lung: Secondary | ICD-10-CM | POA: Insufficient documentation

## 2019-05-15 DIAGNOSIS — C778 Secondary and unspecified malignant neoplasm of lymph nodes of multiple regions: Secondary | ICD-10-CM

## 2019-05-15 DIAGNOSIS — Z7189 Other specified counseling: Secondary | ICD-10-CM

## 2019-05-15 DIAGNOSIS — D509 Iron deficiency anemia, unspecified: Secondary | ICD-10-CM | POA: Diagnosis not present

## 2019-05-15 LAB — CBC WITH DIFFERENTIAL/PLATELET
Abs Immature Granulocytes: 0.02 10*3/uL (ref 0.00–0.07)
Basophils Absolute: 0 10*3/uL (ref 0.0–0.1)
Basophils Relative: 1 %
Eosinophils Absolute: 0.2 10*3/uL (ref 0.0–0.5)
Eosinophils Relative: 4 %
HCT: 37.5 % — ABNORMAL LOW (ref 39.0–52.0)
Hemoglobin: 12.1 g/dL — ABNORMAL LOW (ref 13.0–17.0)
Immature Granulocytes: 0 %
Lymphocytes Relative: 35 %
Lymphs Abs: 1.6 10*3/uL (ref 0.7–4.0)
MCH: 29.9 pg (ref 26.0–34.0)
MCHC: 32.3 g/dL (ref 30.0–36.0)
MCV: 92.6 fL (ref 80.0–100.0)
Monocytes Absolute: 0.3 10*3/uL (ref 0.1–1.0)
Monocytes Relative: 7 %
Neutro Abs: 2.5 10*3/uL (ref 1.7–7.7)
Neutrophils Relative %: 53 %
Platelets: 139 10*3/uL — ABNORMAL LOW (ref 150–400)
RBC: 4.05 MIL/uL — ABNORMAL LOW (ref 4.22–5.81)
RDW: 13.6 % (ref 11.5–15.5)
WBC: 4.7 10*3/uL (ref 4.0–10.5)
nRBC: 0 % (ref 0.0–0.2)

## 2019-05-15 LAB — FERRITIN: Ferritin: 32 ng/mL (ref 24–336)

## 2019-05-15 LAB — COMPREHENSIVE METABOLIC PANEL
ALT: 36 U/L (ref 0–44)
AST: 26 U/L (ref 15–41)
Albumin: 3.8 g/dL (ref 3.5–5.0)
Alkaline Phosphatase: 56 U/L (ref 38–126)
Anion gap: 11 (ref 5–15)
BUN: 23 mg/dL (ref 8–23)
CO2: 22 mmol/L (ref 22–32)
Calcium: 9.1 mg/dL (ref 8.9–10.3)
Chloride: 101 mmol/L (ref 98–111)
Creatinine, Ser: 1.16 mg/dL (ref 0.61–1.24)
GFR calc Af Amer: >60 mL/min (ref >60)
GFR calc non Af Amer: >60 mL/min (ref >60)
Glucose, Bld: 181 mg/dL — ABNORMAL HIGH (ref 70–99)
Potassium: 4.1 mmol/L (ref 3.5–5.1)
Sodium: 134 mmol/L — ABNORMAL LOW (ref 135–145)
Total Bilirubin: 0.5 mg/dL (ref 0.3–1.2)
Total Protein: 7.4 g/dL (ref 6.5–8.1)

## 2019-05-15 MED ORDER — SODIUM CHLORIDE 0.9% FLUSH
10.0000 mL | INTRAVENOUS | Status: DC | PRN
  Administered 2019-05-15: 10 mL via INTRAVENOUS
  Filled 2019-05-15: qty 10

## 2019-05-15 MED ORDER — HEPARIN SOD (PORK) LOCK FLUSH 100 UNIT/ML IV SOLN
500.0000 [IU] | Freq: Once | INTRAVENOUS | Status: AC
  Administered 2019-05-15: 500 [IU] via INTRAVENOUS
  Filled 2019-05-15: qty 5

## 2019-05-16 LAB — CEA: CEA: 9.1 ng/mL — ABNORMAL HIGH (ref 0.0–4.7)

## 2019-05-17 ENCOUNTER — Telehealth: Payer: Self-pay

## 2019-05-17 NOTE — Telephone Encounter (Signed)
-----   Message from Lequita Asal, MD sent at 05/16/2019  5:04 PM EST ----- Regarding: Please call patient  Please call him about his CEA.  ----- Message ----- From: Interface, Lab In [place] Sent: 05/15/2019   9:09 AM EST To: Lequita Asal, MD

## 2019-05-17 NOTE — Telephone Encounter (Signed)
Contacted [name] and informed her of Ferritin results and CEA Results. Informed her Dr. Mike Gip would like patient to start Ferrous Sulfate 325 MG Daily with either vitamin C or orange juice. Also informed her Dr. Mike Gip would like for patient to consider doing a chest CT. [name] states she will talk in depth with patient tonight and contact the office tomorrow.

## 2019-05-19 NOTE — Telephone Encounter (Signed)
Contacted [name] to inquire on if patient has decided if he would like to proceed with the CT. [name] states the patient wanted to think about it and will let her know when he knows. States she will contact the office when he informs her of an answer.

## 2019-05-26 ENCOUNTER — Telehealth: Payer: Self-pay | Admitting: Family Medicine

## 2019-05-26 NOTE — Telephone Encounter (Signed)
Patient is requesting changes in prescription- please see request.

## 2019-05-26 NOTE — Telephone Encounter (Signed)
Can you send a new script for this patient

## 2019-05-26 NOTE — Telephone Encounter (Signed)
Patient states insurance will only cover one touch ultra 2 patient requesting the entire kit, please send new Rx. Informed patient please allow 48 to 72 hour turn around time   [id number] - [place], [place] - [address] [address] Phone:  [phone]  Fax:  [phone]      

## 2019-05-29 NOTE — Telephone Encounter (Signed)
Bonnita Nasuti, can you please call or send this in under me and I will cosign, or send in with VO for the pt.  Thank you Kristeen Miss

## 2019-05-30 ENCOUNTER — Telehealth: Payer: Self-pay

## 2019-05-30 MED ORDER — ACCU-CHEK GUIDE ME W/DEVICE KIT: 1.0000 | PACK | Freq: Three times a day (TID) | 0 refills | Status: DC | PRN

## 2019-05-30 NOTE — Telephone Encounter (Signed)
Copied from [place] [id number]. Topic: General - Other >> May 30, 2019 10:57 AM Leward Quan A wrote: Reason for CRM: Patient called to say that he need Blood Glucose Monitoring Suppl (ACCU-CHEK GUIDE ME) w/Device KIT with test strips and lancets sent to the pharmacy please.

## 2019-05-31 MED ORDER — ONETOUCH ULTRASOFT LANCETS MISC: 12 refills | Status: DC

## 2019-05-31 MED ORDER — ONETOUCH ULTRA 2 W/DEVICE KIT: 1.0000 | PACK | Freq: Three times a day (TID) | 0 refills | Status: AC

## 2019-05-31 MED ORDER — GLUCOSE BLOOD VI STRP: ORAL_STRIP | 12 refills | Status: AC

## 2019-05-31 NOTE — Telephone Encounter (Signed)
Patient's wife called to inform the doctor that her husband received the wrong meter.  He was supposed to get the One touch meter because the insurance does not cover the Accu-Check.  Patient's wife said she can bring the other meter back, but he needs the One touch as soon as possible.  Please advise and call patient to discuss if there are any questions.  CB# [phone]

## 2019-05-31 NOTE — Addendum Note (Signed)
Addended by: Docia Furl on: 05/31/2019 01:25 PM   Modules accepted: Orders

## 2019-06-05 IMAGING — CT CT CHEST W/ CM
3 of 5 series · 14 of 36 positions shown, 17 images · IV contrast (iopamidol)
Comparison: Chest CT on 07/09/2017 and PET-CT on 01/26/2017

CLINICAL DATA: Follow-up colon carcinoma. Undergoing chemotherapy.
Restaging. Status post SBRT for non-small cell lung carcinoma.

EXAM:
CT CHEST, ABDOMEN, AND PELVIS WITH CONTRAST
TECHNIQUE: Multidetector CT imaging of the chest, abdomen and pelvis was
performed following the standard protocol during bolus
administration of intravenous contrast.
CONTRAST:  100mL [id number] IOPAMIDOL (6BKJ0H-3II) INJECTION 61%

[Series 2: cap with · axial · 0.93mm/px · z∈[-878,-333]mm · 9 of 137 slices shown, 12 images]
[im 14/137  mediastinal]
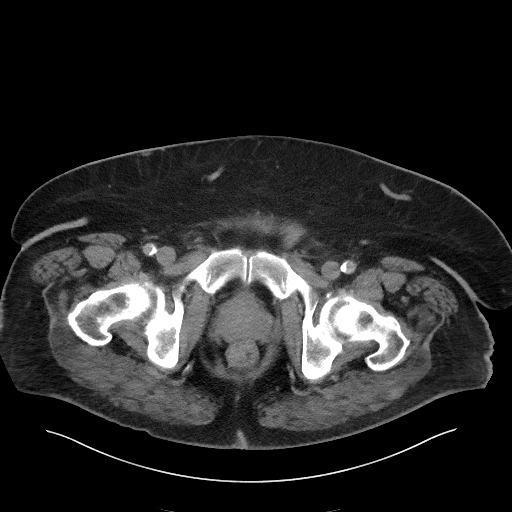
[im 14/137  lung]
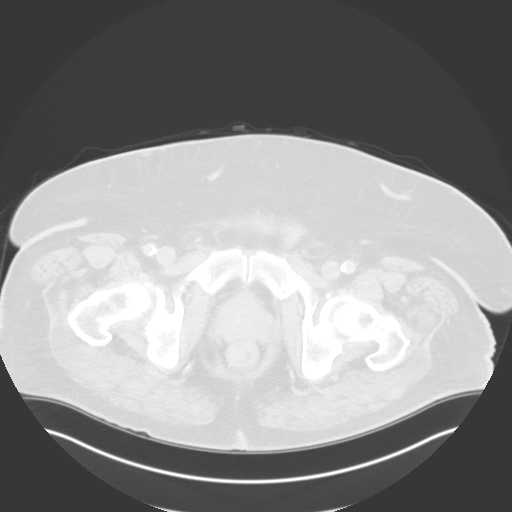
[im 28/137  lung]
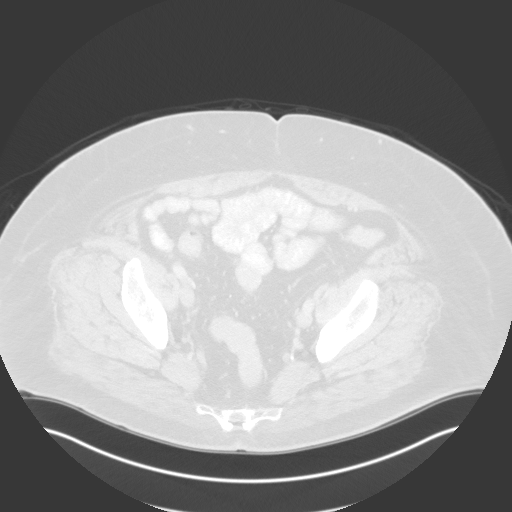
[im 41/137  lung]
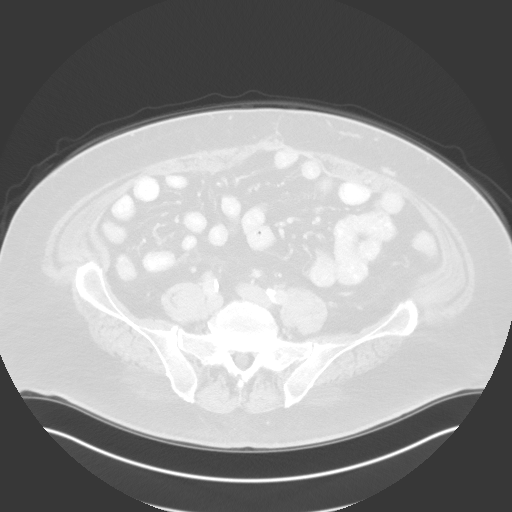
[im 55/137  lung]
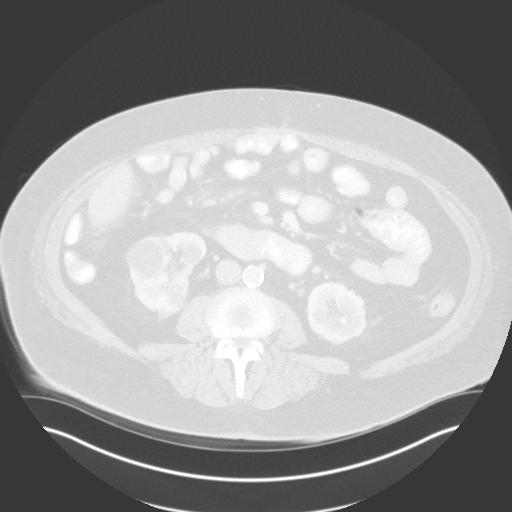
[im 69/137  mediastinal]
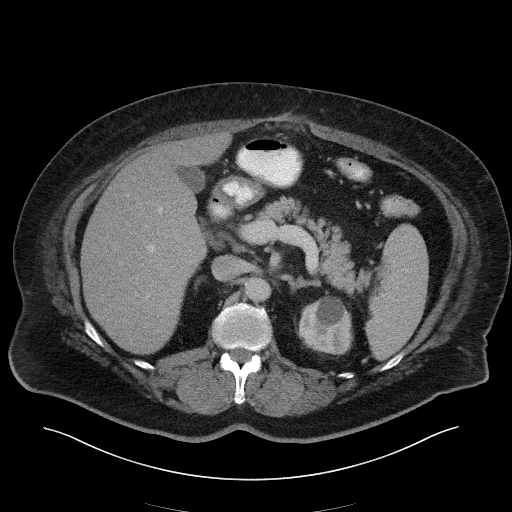
[im 69/137  lung]
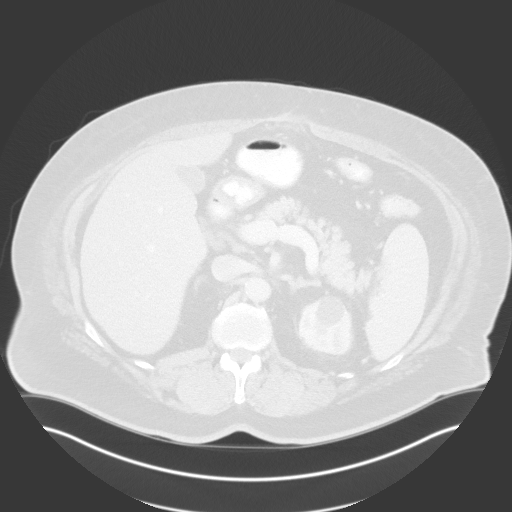
[im 82/137  lung]
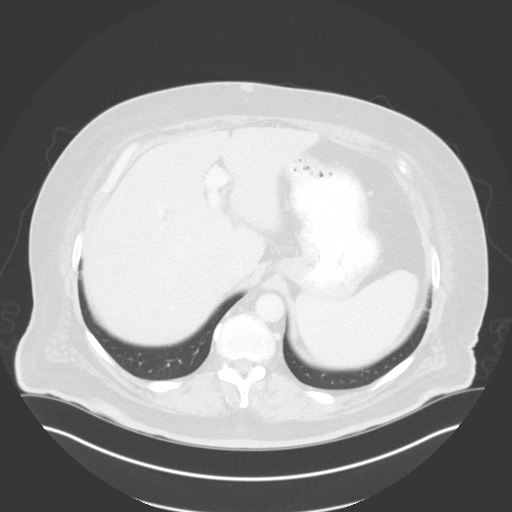
[im 96/137  lung]
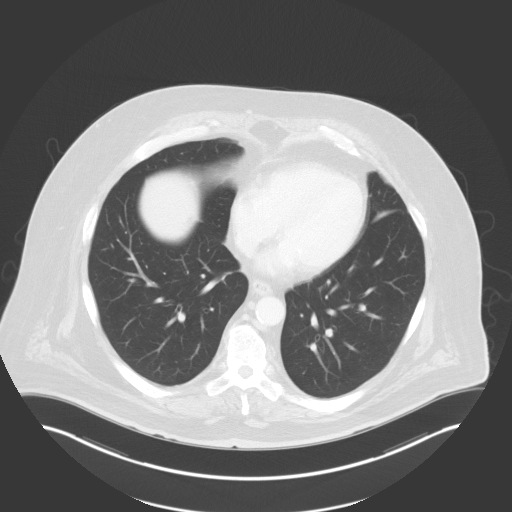
[im 109/137  lung]
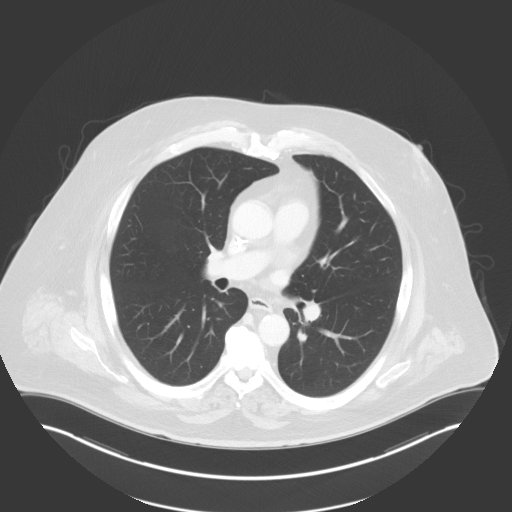
[im 123/137  mediastinal]
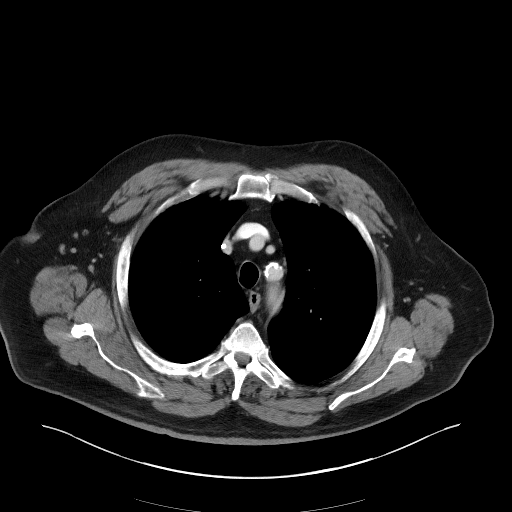
[im 123/137  lung]
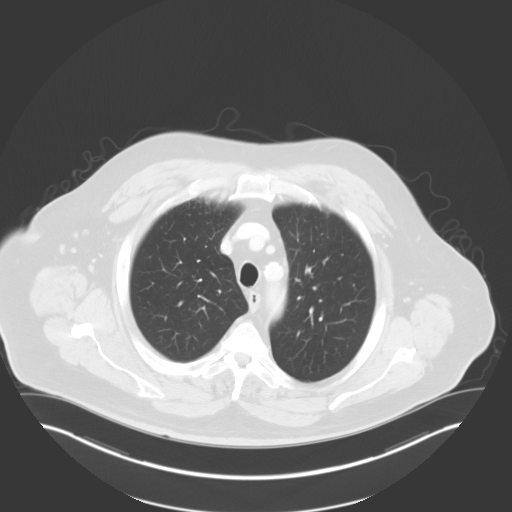

[Series 4: lung · axial · 0.93mm/px · z∈[-587,-533]mm · 2 of 176 slices shown]
[im 14/176  lung]
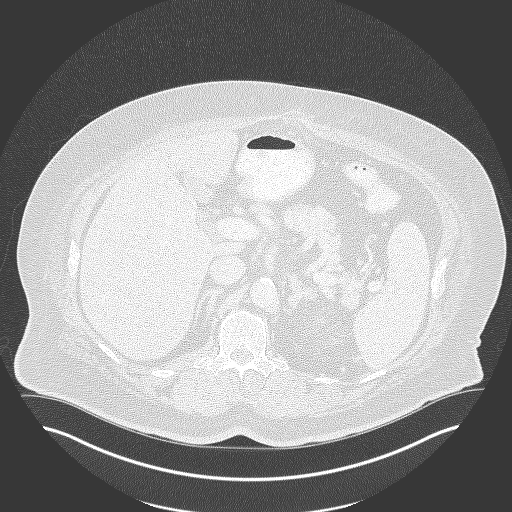
[im 41/176  lung]
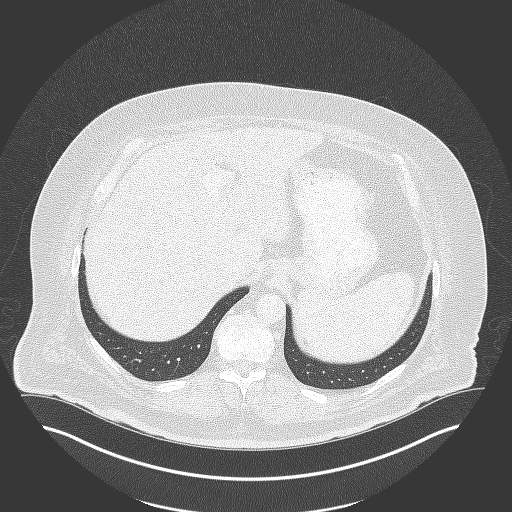

[Series 5: coronals · coronal · 0.97mm/px · 3 of 172 slices shown]
[im 35/172  lung]
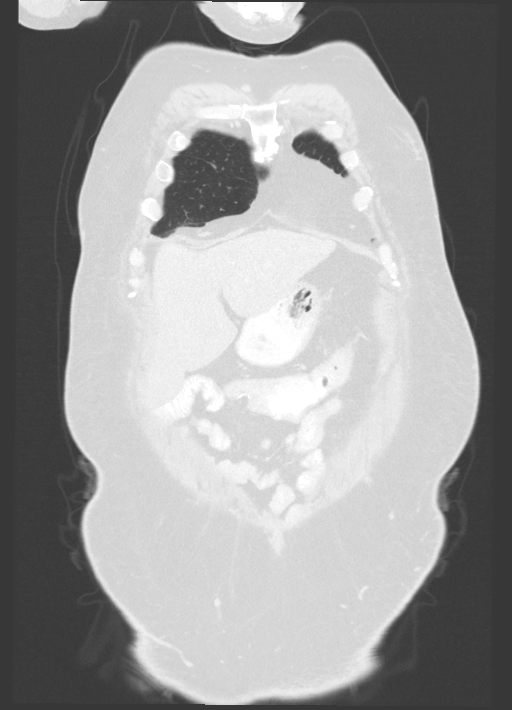
[im 69/172  lung]
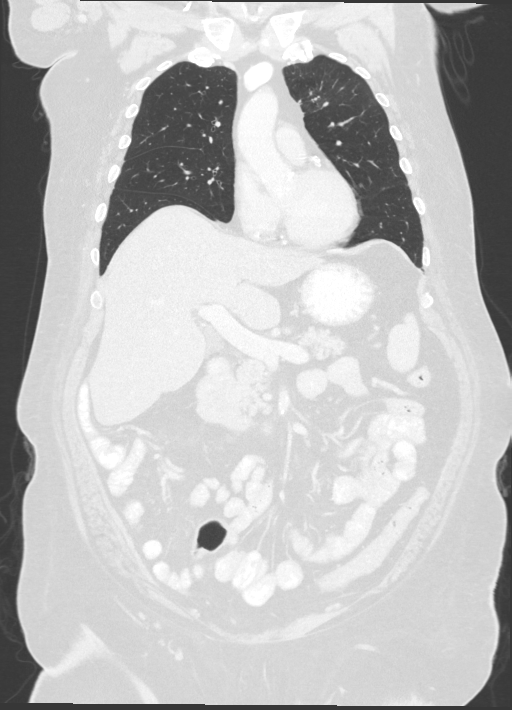
[im 103/172  lung]
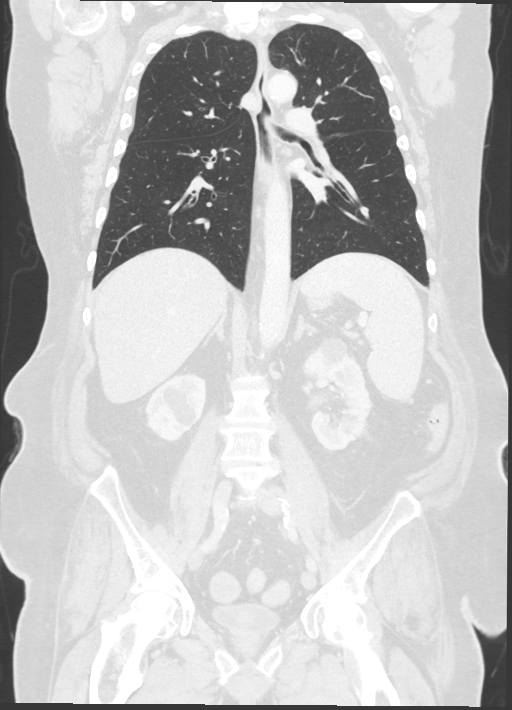

[14 of 36 positions shown; findings below may reference images not displayed]

FINDINGS: CT CHEST FINDINGS

Cardiovascular: No acute findings. Aortic and coronary artery
atherosclerosis.

Mediastinum/Lymph Nodes: No masses or pathologically enlarged lymph
nodes identified.

Lungs/Pleura: Mild post radiation changes in the anterior left upper
lobe. No suspicious pulmonary nodules or masses identified. No
evidence of pulmonary infiltrate or pleural effusion.

Musculoskeletal:  No suspicious bone lesions identified.

CT ABDOMEN AND PELVIS FINDINGS

Hepatobiliary: No masses identified. Gallbladder is unremarkable.

Pancreas:  No mass or inflammatory changes.

Spleen:  Within normal limits in size and appearance.

Adrenals/Urinary tract: No masses or hydronephrosis. Several tiny
nonobstructing renal calculi are again seen bilaterally as well as
small renal cysts.

Stomach/Bowel: Stable postop changes from previous right colectomy.
No evidence of mass, bowel wall thickening, inflammatory changes, or
abnormal fluid collections.

Vascular/Lymphatic: No pathologically enlarged lymph nodes
identified. Small porta hepatis lymph nodes are stable and showed no
activity on previous PET scan. Aortic atherosclerosis. No abdominal
aortic aneurysm.

Reproductive: Prostate median lobe hypertrophy indenting bladder
base.

Other:  None.

Musculoskeletal:  No suspicious bone lesions identified.
IMPRESSION: No acute findings. No evidence of recurrent or metastatic carcinoma
within the chest, abdomen, or pelvis.

## 2019-06-12 ENCOUNTER — Other Ambulatory Visit: Payer: Self-pay | Admitting: Family Medicine

## 2019-06-12 DIAGNOSIS — E1121 Type 2 diabetes mellitus with diabetic nephropathy: Secondary | ICD-10-CM

## 2019-06-12 NOTE — Telephone Encounter (Signed)
Requested Prescriptions  Pending Prescriptions Disp Refills  . glipiZIDE (GLUCOTROL XL) 10 MG 24 hr tablet [Pharmacy Med Name: glipiZIDE ER 10 MG Oral Tablet Extended Release 24 Hour] 90 tablet 0    Sig: Take 1 tablet by mouth once daily with breakfast     Endocrinology:  Diabetes - Sulfonylureas Passed - 06/12/2019 12:24 PM      Passed - HBA1C is between 0 and 7.9 and within 180 days    HbA1c, POC (prediabetic range)  Date Value Ref Range Status  01/28/2018 5.9 5.7 - 6.4 % Final   HbA1c, POC (controlled diabetic range)  Date Value Ref Range Status  01/28/2018 5.9 0.0 - 7.0 % Final   HbA1c POC (<> result, manual entry)  Date Value Ref Range Status  01/28/2018 5.9 4.0 - 5.6 % Final   Hgb A1c MFr Bld  Date Value Ref Range Status  12/26/2018 6.2 (H) 4.8 - 5.6 % Final    Comment:    (NOTE) Pre diabetes:          5.7%-6.4% Diabetes:              >6.4% Glycemic control for   <7.0% adults with diabetes          Passed - Valid encounter within last 6 months    Recent Outpatient Visits          5 months ago Advanced care planning/counseling discussion   [place], [place], FNP   9 months ago Essential hypertension, benign   Luis Lopez, Benjamine Mola E, NP   1 year ago Uncontrolled type 2 diabetes mellitus with diabetic nephropathy [facility])   [place], Satira Anis, MD   1 year ago Uncontrolled type 2 diabetes mellitus with diabetic nephropathy [facility])   [place], Satira Anis, MD   1 year ago Malignant neoplasm of upper lobe of left lung [facility])   [place], Satira Anis, MD      Future Appointments            In 4 months Carlton, Ronda Fairly, MD [place]

## 2019-06-26 ENCOUNTER — Other Ambulatory Visit: Payer: Self-pay | Admitting: Family Medicine

## 2019-06-26 DIAGNOSIS — E1121 Type 2 diabetes mellitus with diabetic nephropathy: Secondary | ICD-10-CM

## 2019-08-01 ENCOUNTER — Other Ambulatory Visit: Payer: Self-pay | Admitting: Family Medicine

## 2019-08-01 DIAGNOSIS — I10 Essential (primary) hypertension: Secondary | ICD-10-CM

## 2019-08-01 MED ORDER — QUINAPRIL HCL 5 MG PO TABS: 5.0000 mg | ORAL_TABLET | Freq: Every day | ORAL | 0 refills | Status: DC

## 2019-08-01 NOTE — Telephone Encounter (Signed)
Order sent to provider for refill

## 2019-08-01 NOTE — Telephone Encounter (Signed)
Pt request refill  quinapril (ACCUPRIL) 5 MG tablet  Wife states pt was told he did not need to come back.  Advised wife that pt will most likely need appt, but someone will be in touch to schedule.  She states pt is out of this medication, and hopes a courtesy supply can be sent to pharmacy until pt can get in to see the doctor.  [place], [place] [facility] Phone:  [phone]  Fax:  [phone]

## 2019-08-01 NOTE — Telephone Encounter (Signed)
Pt wife called about status of refill request/ Pt was told by Raquel Sarna that he didn't need an appt due to being a cancer Pt and the Pt asked if Kristeen Miss can be his new PCP Derrek Monaco advise

## 2019-08-21 ENCOUNTER — Other Ambulatory Visit: Payer: Self-pay

## 2019-08-21 DIAGNOSIS — C184 Malignant neoplasm of transverse colon: Secondary | ICD-10-CM

## 2019-08-23 NOTE — Progress Notes (Signed)
[facility] [facility]  [address], [address] [place], [place] [address] Phone: [phone]  Fax: [phone]   Clinic Day:  08/24/2019  Referring physician: Boyce, Emily E, FNP  Chief Complaint: Donald F Arns Jr. is a 74 y.o. male with squamous cell lung cancer s/p SBRT and stage IV colon cancer who is seen for a 3 month assessment.  HPI: The patient was last seen in the medical oncology clinic on 05/15/2019. At that time, he had chronic shortness of breath and cough. He denied any pain. Exam was stable. Hematocrit 37.5, hemoglobin 12.1, platelets 139,000, WBC 4,700. Ferritin was 32. Sodium was 134. CEA was 9.1. He continued on oral iron.   Patient was contacted on 05/17/2019 by Cesalea, RN and directed to begin ferrous sulfate 325 mg daily with either vitamin C or OJ.   During the interim, he has felt short of breath. He spends most of his time sitting and resting due to shortness of breath. He continues to have a cough. He passed a large kidney stone back in 06/2019. He is not on any oral iron. Denies neuropathy in his fingers or toes.   I advised the patient to contact the clinic when he would like to start oral treatment or have any imaging done.  Currently, he does not wish to pursue treatment or imaging.  Patient is not interested in the covid vaccine.    Past Medical History:  Diagnosis Date  . Abdominal aortic atherosclerosis (HCC) 09/15/2016   CT scan June 2018  . Allergy   . Cancer (HCC)   . Coronary artery calcification seen on CAT scan 09/15/2016   Previous smoker; noted on CT scan June 2018  . Diabetes mellitus without complication (HCC)   . GERD (gastroesophageal reflux disease)   . Gout   . Hyperlipidemia   . Hypertension   . Kidney function abnormal    stage 2  . Kidney stones   . Obesity 08/20/2015    Past Surgical History:  Procedure Laterality Date  . COLONOSCOPY WITH PROPOFOL N/A 11/05/2016   Procedure: COLONOSCOPY WITH PROPOFOL;   Surgeon: Wohl, Darren, MD;  Location: [facility];  Service: Gastroenterology;  Laterality: N/A;  . COLONOSCOPY WITH PROPOFOL N/A 02/15/2018   Procedure: COLONOSCOPY WITH PROPOFOL;  Surgeon: Wohl, Darren, MD;  Location: [facility];  Service: Endoscopy;  Laterality: N/A;  . ELECTROMAGNETIC NAVIGATION BROCHOSCOPY N/A 12/16/2016   Procedure: ELECTROMAGNETIC NAVIGATION BRONCHOSCOPY;  Surgeon: Kasa, Kurian, MD;  Location: [facility];  Service: Cardiopulmonary;  Laterality: N/A;  . ESOPHAGOGASTRODUODENOSCOPY (EGD) WITH PROPOFOL N/A 11/05/2016   Procedure: ESOPHAGOGASTRODUODENOSCOPY (EGD) WITH PROPOFOL;  Surgeon: Wohl, Darren, MD;  Location: [facility];  Service: Gastroenterology;  Laterality: N/A;  Diabetic - oral meds  . kidney stones    . PARTIAL COLECTOMY N/A 12/28/2016   Procedure: PARTIAL COLECTOMY RIGHT;  Surgeon: Pabon, Diego F, MD;  Location: [facility];  Service: General;  Laterality: N/A;  . POLYPECTOMY N/A 11/05/2016   Procedure: POLYPECTOMY;  Surgeon: Wohl, Darren, MD;  Location: [facility];  Service: Gastroenterology;  Laterality: N/A;  . PORTACATH PLACEMENT Right 01/18/2017   Procedure: INSERTION PORT-A-CATH;  Surgeon: Pabon, Diego F, MD;  Location: [facility];  Service: General;  Laterality: Right;    Family History  Problem Relation Age of Onset  . Alzheimer's disease Mother   . Heart disease Father   . Diabetes Father   . Cancer Father        Prostate  . Cancer Sister          skin cancer    Social History:  reports that he quit smoking about 3 years ago. His smoking use included cigarettes. He has never used smokeless tobacco. He reports that he does not drink alcohol and does not use drugs. 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 He was in his 40s. Patient was a 1-2 pack per day smoker since he was a child. He stopped smoking in 07/2016.He drank alcohol in the past. He has4 sisters,3of which areliving.He has no children.  Patient's wife is Mary.He communicates regularlywith hisneighbor Susan Shelton, who can be contacted [phone]. The patient is accompanied by Susan today.  Allergies: No Known Allergies  Current Medications: Current Outpatient Medications  Medication Sig Dispense Refill  . ACCU-CHEK FASTCLIX LANCETS MISC Check blood sugars three times daily Dx:E11.21, LON:99 months 100 each 3  . allopurinol (ZYLOPRIM) 300 MG tablet Take 1 tablet (300 mg total) by mouth daily. 90 tablet 1  . Blood Glucose Monitoring Suppl (ACCU-CHEK GUIDE ME) w/Device KIT 1 Device by Does not apply route 3 (three) times daily. Dx:E11.21, Lon:99 1 kit 0  . Blood Glucose Monitoring Suppl (ACCU-CHEK GUIDE ME) w/Device KIT 1 applicator by Does not apply route 3 (three) times daily as needed. Check tid Dx: E11.21  LON:99 1 kit 0  . Blood Glucose Monitoring Suppl (ONE TOUCH ULTRA 2) w/Device KIT 1 applicator by Does not apply route in the morning, at noon, and at bedtime. Check bs TID dX:E11.21 LON:99 1 kit 0  . Cyanocobalamin (VITAMIN B-12 PO) Take 1 tablet by mouth daily.    . Ferrous Fumarate (HEMOCYTE - 106 MG FE) 324 (106 Fe) MG TABS tablet Take 1 tablet by mouth.    . glipiZIDE (GLUCOTROL XL) 10 MG 24 hr tablet Take 1 tablet by mouth once daily with breakfast 90 tablet 0  . glucose blood (ACCU-CHEK GUIDE) test strip USE 1 STRIP  TO CHECK BLOOD SUGARS THREE TIMES DAILY LON:99 Dx:E11.9 100 each 3  . glucose blood test strip CHECK BS TID DX:E11.9 LON:99 100 each 12  . insulin NPH Human (NOVOLIN N RELION) 100 UNIT/ML injection Inject 0.38 mLs (38 Units total) into the skin daily before breakfast. 10 mL 0  . Insulin Pen Needle 31G X 6 MM MISC Use to inject insulin subcutaneously once a day in the morning 100 each 0  . Lancets (ACCU-CHEK MULTICLIX) lancets Check fingerstick blood sugars 3x a day; E11.29, LON:99 month 100 each 12  . Lancets (ONETOUCH ULTRASOFT) lancets CHECK BS TID DX:E11.9, LON:99 100 each 12  . metFORMIN  (GLUCOPHAGE) 1000 MG tablet Take 1 tablet (1,000 mg total) by mouth 2 (two) times daily with a meal. 180 tablet 1  . nateglinide (STARLIX) 60 MG tablet TAKE 1 TABLET BY MOUTH THREE TIMES DAILY WITH MEALS 90 tablet 0  . pantoprazole (PROTONIX) 20 MG tablet Take 1 tablet by mouth once daily 90 tablet 0  . quinapril (ACCUPRIL) 5 MG tablet Take 1 tablet (5 mg total) by mouth daily. 90 tablet 0  . rosuvastatin (CRESTOR) 5 MG tablet Take 1 tablet (5 mg total) by mouth daily. 90 tablet 1  . saw palmetto 160 MG capsule Take 540 mg by mouth 2 (two) times daily.     . tamsulosin (FLOMAX) 0.4 MG CAPS capsule Take 1 capsule (0.4 mg total) by mouth daily. 90 capsule 6   No current facility-administered medications for this visit.   Facility-Administered Medications Ordered in Other Visits  Medication Dose Route Frequency Provider   Last Rate Last Admin  . heparin lock flush 100 unit/mL  500 Units Intravenous Once Corcoran, Melissa C, MD        Review of Systems  Constitutional: Negative.  Negative for chills, diaphoresis, fever, malaise/fatigue and weight loss (up 3 lbs).       Feels short of breath. Not active.  HENT: Positive for hearing loss. Negative for congestion, ear discharge, ear pain, nosebleeds, sinus pain and sore throat.   Eyes: Negative.  Negative for blurred vision, double vision and photophobia.  Respiratory: Positive for cough and shortness of breath (minimal exertion). Negative for hemoptysis, sputum production and wheezing.   Cardiovascular: Negative.  Negative for chest pain, palpitations, orthopnea, leg swelling and PND.  Gastrointestinal: Negative.  Negative for abdominal pain, blood in stool, constipation, diarrhea, heartburn, melena, nausea and vomiting.  Genitourinary: Negative.  Negative for dysuria, frequency, hematuria and urgency.       Kidney stone in 06/2019.  Musculoskeletal: Negative for back pain (lower back; at night), falls, joint pain, myalgias (occasional sharp pain in  right leg) and neck pain.  Skin: Negative.  Negative for itching and rash.  Neurological: Negative.  Negative for dizziness, tremors, sensory change, speech change, focal weakness, weakness and headaches.  Endo/Heme/Allergies: Does not bruise/bleed easily.       Diabetes.  Psychiatric/Behavioral: Positive for memory loss (mild). Negative for depression and substance abuse. The patient is not nervous/anxious and does not have insomnia.   All other systems reviewed and are negative.  Performance status (ECOG):  1  Vitals Blood pressure 118/60, pulse 95, temperature (!) 97.2 F (36.2 C), temperature source Tympanic, weight 284 lb 1 oz (128.8 kg), SpO2 98 %.   Physical Exam Vitals and nursing note reviewed.  Constitutional:      General: He is not in acute distress.    Appearance: He is well-developed and well-nourished. He is not diaphoretic.  HENT:     Head: Normocephalic and atraumatic.     Mouth/Throat:     Mouth: Oropharynx is clear and moist.     Pharynx: No oropharyngeal exudate.      Comments: Gray hair.  Male pattern baldness.  Mask. Eyes:     General: No scleral icterus.    Extraocular Movements: EOM normal.     Conjunctiva/sclera: Conjunctivae normal.     Pupils: Pupils are equal, round, and reactive to light.     Comments: Glasses.  Blue eyes.  Left amblyopia.  Neck:     Vascular: No JVD.  Cardiovascular:     Rate and Rhythm: Normal rate and regular rhythm.     Heart sounds: Normal heart sounds. No murmur heard.   Pulmonary:     Effort: Pulmonary effort is normal. No respiratory distress.     Breath sounds: Normal breath sounds. No wheezing or rales.     Comments: Port accessed. Chest:     Chest wall: No tenderness.  Abdominal:     General: Bowel sounds are normal. There is no distension.     Palpations: Abdomen is soft. There is no mass.     Tenderness: There is no abdominal tenderness. There is no guarding or rebound.     Comments: No appreciable  hepatosplenomegaly.  Musculoskeletal:        General: No tenderness or edema. Normal range of motion.     Cervical back: Normal range of motion and neck supple.  Lymphadenopathy:     Head:     Right side of head: No preauricular, posterior   auricular or occipital adenopathy.     Left side of head: No preauricular, posterior auricular or occipital adenopathy.     Cervical: No cervical adenopathy.     Upper Body:  No axillary adenopathy present.    Right upper body: No supraclavicular adenopathy.     Left upper body: No supraclavicular adenopathy.     Lower Body: No right inguinal adenopathy. No left inguinal adenopathy.  Skin:    General: Skin is warm and dry.     Coloration: Skin is not pale.     Findings: No erythema or rash.  Neurological:     Mental Status: He is alert and oriented to person, place, and time.  Psychiatric:        Mood and Affect: Mood and affect normal.        Behavior: Behavior normal.        Thought Content: Thought content normal.        Judgment: Judgment normal.    Infusion on 08/24/2019  Component Date Value Ref Range Status  . Sodium 08/24/2019 137  135 - 145 mmol/L Final  . Potassium 08/24/2019 4.2  3.5 - 5.1 mmol/L Final  . Chloride 08/24/2019 103  98 - 111 mmol/L Final  . CO2 08/24/2019 24  22 - 32 mmol/L Final  . Glucose, Bld 08/24/2019 132* 70 - 99 mg/dL Final   Glucose reference range applies only to samples taken after fasting for at least 8 hours.  . BUN 08/24/2019 24* 8 - 23 mg/dL Final  . Creatinine, Ser 08/24/2019 1.39* 0.61 - 1.24 mg/dL Final  . Calcium 08/24/2019 9.3  8.9 - 10.3 mg/dL Final  . Total Protein 08/24/2019 7.8  6.5 - 8.1 g/dL Final  . Albumin 08/24/2019 3.9  3.5 - 5.0 g/dL Final  . AST 08/24/2019 32  15 - 41 U/L Final  . ALT 08/24/2019 37  0 - 44 U/L Final  . Alkaline Phosphatase 08/24/2019 66  38 - 126 U/L Final  . Total Bilirubin 08/24/2019 0.3  0.3 - 1.2 mg/dL Final  . GFR calc non Af Amer 08/24/2019 50* >60 mL/min  Final  . GFR calc Af Amer 08/24/2019 58* >60 mL/min Final  . Anion gap 08/24/2019 10  5 - 15 Final   Performed at [facility], [address], [place], [place] [address]  . WBC 08/24/2019 7.2  4.0 - 10.5 K/uL Final  . RBC 08/24/2019 4.05* 4.22 - 5.81 MIL/uL Final  . Hemoglobin 08/24/2019 12.4* 13.0 - 17.0 g/dL Final  . HCT 08/24/2019 37.9* 39 - 52 % Final  . MCV 08/24/2019 93.6  80.0 - 100.0 fL Final  . MCH 08/24/2019 30.6  26.0 - 34.0 pg Final  . MCHC 08/24/2019 32.7  30.0 - 36.0 g/dL Final  . RDW 08/24/2019 14.2  11.5 - 15.5 % Final  . Platelets 08/24/2019 154  150 - 400 K/uL Final  . nRBC 08/24/2019 0.0  0.0 - 0.2 % Final  . Neutrophils Relative % 08/24/2019 57  % Final  . Neutro Abs 08/24/2019 4.2  1.7 - 7.7 K/uL Final  . Lymphocytes Relative 08/24/2019 30  % Final  . Lymphs Abs 08/24/2019 2.2  0.7 - 4.0 K/uL Final  . Monocytes Relative 08/24/2019 5  % Final  . Monocytes Absolute 08/24/2019 0.4  0 - 1 K/uL Final  . Eosinophils Relative 08/24/2019 7  % Final  . Eosinophils Absolute 08/24/2019 0.5  0 - 0 K/uL Final  . Basophils Relative 08/24/2019 1  %  Final  . Basophils Absolute 08/24/2019 0.0  0 - 0 K/uL Final  . Immature Granulocytes 08/24/2019 0  % Final  . Abs Immature Granulocytes 08/24/2019 0.02  0.00 - 0.07 K/uL Final   Performed at [facility], [address] [place], [place] [address]    Assessment:  Donald Cott. is a 74 y.o. male with stage IIIB transverse colon cancers/p right partial colectomy on 12/28/2016. Pathology revealed a 5.5 cm grade II invasive adenocarcinoma of the transverse colon, extending through the muscularis propria and into the adjacent adipose tissue, including the greater omentum. There was metastatic adenocarcinoma in multiple lymph nodes and tumor deposits, at least 5 involved with a total of at least 18 lymph nodes (5 of 18). Pathologic stagewas T3N2a.CEAwas 1.4 on 11/09/2016.  Abdomen and pelvic CTon  11/09/2016 revealed mid transverse colon segment of luminal narrowing with wall thickening compatible with colon cancer. There was a single enlarged adjacent mesenteric lymph node and prominent subcentimeter lymph nodes anterior to the pancreas which may be metastatic.   Chest CTon 11/18/2016 revealed no metastatic disease. There was abrupt termination of the left upper lobe beyond which the distal bronchus appeared dilated with mild branching into the lung parenchyma. Imaging suggested a bronchocele in the medial aspect of the left upper lobe. Differential included an endobronchial polyp, endobronchial tumor, bronchial stricture or aspirated foreign body.   Electromagnetic navigational bronchoscopy (ENB) on 12/16/2016 revealed non-small cell carcinoma, favor squamous cell lung carcinoma. Tumor was positive for p40 and negative for cdx-2 and TTF-1. He received SBRTto the left upper lobe of 5000 cGy in 5 fractions from 02/16/2017 - 03/02/2017.  EGDon 11/05/2016 revealed gastritis. Pathology confirmed chronic mild gastritis and features of a healing erosion in the stomach. There was no H pylori, dysplasia or malignancy. Colonoscopy on 12/03/2019revealedgrade II internal hemorrhoids.There was a single 3 mm polyp in the transverse colon(tubular adenoma).  He received 12 cycles of FOLFOXchemotherapy (03/15/2017 - 09/13/2017).   Chest, abdomen, and pelvisCTon 09/19/2018 revealed a markedly enlarged portacaval lymph node(4.2 x 2.5 x 4.8 cm; previously 2.3 x 1.0 x 1.9 cm)concerning for metastatic adenopathy.There were changes of external beam radiation within the left upper lobe. Increased nodular soft tissue attenuation within the radiation portwas nonspecific in may represent evolutionary changes of external beam radiation. Recurrent tumor would be difficult to exclude as thiswas a new finding from 10/11/2017.He has kidney stones.  PET scanon 09/27/2018 revealed enlarged  hypermetabolic periportal lymph nodes(2.6 cm node with SUV 12.9 and an adjacent 1.6 cm node with an SUV of 11.2)consistent with metastatic adenopathy.There was resolution of hypermetabolic nodularity in the left upper lobe with benign-appearing perihilar radiation change.There was no evidence of mediastinal metastatic adenopathy.He is s/pright hemicolectomy anatomywith no evidence of local recurrence.  [facility] confirmedmetastatic adenocarcinomac/wclinical history of colorectal adenocarcinoma. IHCwas negative for CK7 and TTF-1 and positive for CK20, CDX-2, and SATB2.   CEAhas been followed: 1.4 on 11/09/2016, 3.9 on 10/14/2017, 2.0 on 01/18/2018, 1.7 on 04/21/2018, 2.4 on 07/19/2018, 2.8 on 09/19/2018, 4.0 on 12/26/2018, 6.7 on 02/14/2019, 9.1 on 05/15/2019, and 9.2 on 08/24/2019.  He has iron deficiency anemia. He is on oral iron. Ferritin was 6 on 09/04/2016. Hematocrit was 29.7 and hemoglobin 8.8 on 09/04/2016. Hematocrit was 28.1, hemoglobin 9.3, and MCV 87.9 on 11/09/2016.   Ferritinhas been followed: 28 on 10/13/2016, 15 on 01/14/2017, 21 on 03/29/2016, 20 on 04/12/2017, 24 on 04/26/2017, 49 on 07/12/2017, 50 on 08/02/2017, 48 on 08/16/2017, 37 on 01/18/2018,  42 on 04/21/2018, 44 on 07/19/2018, 33 on 02/14/2019, 32 on 05/15/2019, and 29 on 08/24/2019.  Echoon 12/15/2016 revealed mild to moderate aortic valve stenosis. There was abnormal LV relaxation consistent with grade I diastolic dysfunction. Estimated EF was 55-60%.  PD-L1 testingwas 0%. Foundation One testing revealed KRAS G12A, and NRAS wildtype (codons 12, 13, 59, 61, 117 &146 in exons 2, 3, &4). BRAF was negative. MS-stable. TMB 1 Mutsz/Mb, APC I1417 fs*3, DIS3 amplification, MRE11A Q482fs*5, SMAD4 R361H, SOX9 T243fs*10, and TP53 R273H.  Patient is not interested in the COVID-19 vaccine.   Symptomatically, he has chronic shortness of breath.  Exam is stable.  Plan: 1.   Labs  today: CBC with diff, CMP, CEA, ferritin. 2.   Stage IIIB transverse colon cancer Clinically, he appears to be doing well. He remains asymptomatic regarding the hypermetabolic periportal lymph nodes documented on PET scan.  Pathology from endoscopic ultrasound at [facility] confirmed metastatic adenocarcinoma the colon.   Foundation Onerevealedno targetable mutation. Discuss consideration of restaging studies (last 09/27/2018)- patient declines today Discuss consideration of treatment- patient declines today.  Previously discussed FOLFIRI.  Patient not interested as his disease is not curable. He would like to continue follow-up visits. 3.Stage I squamous cell carcinoma of the LEFT lung Clinically, he appears to be doing well. No imaging since 09/2018. 4.Shortness of breath  Patient has chronic shortness of breath.  Patient  Not interested in evaluation.  Encourage follow-up with his PCP. 5.   Iron deficiency Hematocrit 37.9.Hemoglobin 12.4. MCV93.6.             Ferritin29. Ferritin goal is 100. Encourage patient to continue oral iron. 6.   RN to call patient with ferritin and CEA level. 7.   Port flush every 6-12 weeks. 8.   RTC in 3 months for MD assessment, labs (CBC with diff, CMP, CEA, ferritin) and port flush.  I discussed the assessment and treatment plan with the patient.  The patient was provided an opportunity to ask questions and all were answered.  The patient agreed with the plan and demonstrated an understanding of the instructions.  The patient was advised to call back if the symptoms worsen or if the condition fails to improve as anticipated.  I provided 25 minutes of face-to-face time during this encounter and > 50% was spent counseling as documented under my assessment and plan.   Melissa C Corcoran, MD, PhD    08/24/2019, 4:21 PM  I, Alexis Patterson, am acting as scribe for Melissa C. Corcoran, MD, PhD.  I, Melissa C.  Corcoran, MD, have reviewed the above documentation for accuracy and completeness, and I agree with the above. 

## 2019-08-24 ENCOUNTER — Inpatient Hospital Stay (HOSPITAL_BASED_OUTPATIENT_CLINIC_OR_DEPARTMENT_OTHER): Payer: Medicare Other | Admitting: Hematology and Oncology

## 2019-08-24 ENCOUNTER — Inpatient Hospital Stay: Payer: Medicare Other | Attending: Hematology and Oncology

## 2019-08-24 ENCOUNTER — Other Ambulatory Visit: Payer: Self-pay

## 2019-08-24 ENCOUNTER — Encounter: Payer: Self-pay | Admitting: Hematology and Oncology

## 2019-08-24 VITALS — BP 118/60 | HR 95 | Temp 97.2°F | Wt 284.1 lb

## 2019-08-24 DIAGNOSIS — Z452 Encounter for adjustment and management of vascular access device: Secondary | ICD-10-CM | POA: Diagnosis not present

## 2019-08-24 DIAGNOSIS — R0602 Shortness of breath: Secondary | ICD-10-CM | POA: Insufficient documentation

## 2019-08-24 DIAGNOSIS — D509 Iron deficiency anemia, unspecified: Secondary | ICD-10-CM | POA: Insufficient documentation

## 2019-08-24 DIAGNOSIS — C3412 Malignant neoplasm of upper lobe, left bronchus or lung: Secondary | ICD-10-CM | POA: Diagnosis not present

## 2019-08-24 DIAGNOSIS — Z7189 Other specified counseling: Secondary | ICD-10-CM

## 2019-08-24 DIAGNOSIS — C184 Malignant neoplasm of transverse colon: Secondary | ICD-10-CM | POA: Insufficient documentation

## 2019-08-24 DIAGNOSIS — Z85118 Personal history of other malignant neoplasm of bronchus and lung: Secondary | ICD-10-CM | POA: Insufficient documentation

## 2019-08-24 LAB — COMPREHENSIVE METABOLIC PANEL
ALT: 37 U/L (ref 0–44)
AST: 32 U/L (ref 15–41)
Albumin: 3.9 g/dL (ref 3.5–5.0)
Alkaline Phosphatase: 66 U/L (ref 38–126)
Anion gap: 10 (ref 5–15)
BUN: 24 mg/dL — ABNORMAL HIGH (ref 8–23)
CO2: 24 mmol/L (ref 22–32)
Calcium: 9.3 mg/dL (ref 8.9–10.3)
Chloride: 103 mmol/L (ref 98–111)
Creatinine, Ser: 1.39 mg/dL — ABNORMAL HIGH (ref 0.61–1.24)
GFR calc Af Amer: 58 mL/min — ABNORMAL LOW (ref >60)
GFR calc non Af Amer: 50 mL/min — ABNORMAL LOW (ref >60)
Glucose, Bld: 132 mg/dL — ABNORMAL HIGH (ref 70–99)
Potassium: 4.2 mmol/L (ref 3.5–5.1)
Sodium: 137 mmol/L (ref 135–145)
Total Bilirubin: 0.3 mg/dL (ref 0.3–1.2)
Total Protein: 7.8 g/dL (ref 6.5–8.1)

## 2019-08-24 LAB — CBC WITH DIFFERENTIAL/PLATELET
Abs Immature Granulocytes: 0.02 10*3/uL (ref 0.00–0.07)
Basophils Absolute: 0 10*3/uL (ref 0.0–0.1)
Basophils Relative: 1 %
Eosinophils Absolute: 0.5 10*3/uL (ref 0.0–0.5)
Eosinophils Relative: 7 %
HCT: 37.9 % — ABNORMAL LOW (ref 39.0–52.0)
Hemoglobin: 12.4 g/dL — ABNORMAL LOW (ref 13.0–17.0)
Immature Granulocytes: 0 %
Lymphocytes Relative: 30 %
Lymphs Abs: 2.2 10*3/uL (ref 0.7–4.0)
MCH: 30.6 pg (ref 26.0–34.0)
MCHC: 32.7 g/dL (ref 30.0–36.0)
MCV: 93.6 fL (ref 80.0–100.0)
Monocytes Absolute: 0.4 10*3/uL (ref 0.1–1.0)
Monocytes Relative: 5 %
Neutro Abs: 4.2 10*3/uL (ref 1.7–7.7)
Neutrophils Relative %: 57 %
Platelets: 154 10*3/uL (ref 150–400)
RBC: 4.05 MIL/uL — ABNORMAL LOW (ref 4.22–5.81)
RDW: 14.2 % (ref 11.5–15.5)
WBC: 7.2 10*3/uL (ref 4.0–10.5)
nRBC: 0 % (ref 0.0–0.2)

## 2019-08-24 MED ORDER — SODIUM CHLORIDE 0.9% FLUSH
10.0000 mL | Freq: Once | INTRAVENOUS | Status: AC
  Administered 2019-08-24: 10 mL via INTRAVENOUS
  Filled 2019-08-24: qty 10

## 2019-08-24 MED ORDER — HEPARIN SOD (PORK) LOCK FLUSH 100 UNIT/ML IV SOLN
500.0000 [IU] | Freq: Once | INTRAVENOUS | Status: AC
  Administered 2019-08-24: 500 [IU] via INTRAVENOUS
  Filled 2019-08-24: qty 5

## 2019-08-25 LAB — CEA: CEA: 9.2 ng/mL — ABNORMAL HIGH (ref 0.0–4.7)

## 2019-08-25 LAB — FERRITIN: Ferritin: 29 ng/mL (ref 24–336)

## 2019-08-29 ENCOUNTER — Telehealth: Payer: Self-pay

## 2019-08-29 NOTE — Telephone Encounter (Signed)
-----   Message from Lequita Asal, MD sent at 08/29/2019  8:28 AM EDT ----- Regarding: Please call patient  Ensure he is taking oral iron.  M ----- Message ----- From: Buel Ream, [facility] [place] Sent: 08/24/2019   3:46 PM EDT To: Lequita Asal, MD

## 2019-08-29 NOTE — Telephone Encounter (Signed)
Spoke with patients caregiver and advised her to have him start back on his iron. He has not been taking it

## 2019-09-02 ENCOUNTER — Other Ambulatory Visit: Payer: Self-pay | Admitting: Urology

## 2019-09-04 ENCOUNTER — Telehealth: Payer: Self-pay

## 2019-09-04 DIAGNOSIS — E1121 Type 2 diabetes mellitus with diabetic nephropathy: Secondary | ICD-10-CM

## 2019-09-05 NOTE — Telephone Encounter (Signed)
Pt is scheduled with Dr Roxan Hockey for next week

## 2019-09-07 ENCOUNTER — Other Ambulatory Visit: Payer: Self-pay

## 2019-09-07 ENCOUNTER — Encounter: Payer: Self-pay | Admitting: Family Medicine

## 2019-09-07 ENCOUNTER — Ambulatory Visit: Payer: Medicare Other | Admitting: Family Medicine

## 2019-09-07 VITALS — BP 140/58 | HR 87 | Temp 97.6°F | Resp 16 | Ht 71.0 in | Wt 286.0 lb

## 2019-09-07 DIAGNOSIS — E785 Hyperlipidemia, unspecified: Secondary | ICD-10-CM

## 2019-09-07 DIAGNOSIS — I700 Atherosclerosis of aorta: Secondary | ICD-10-CM | POA: Diagnosis not present

## 2019-09-07 DIAGNOSIS — T451X5A Adverse effect of antineoplastic and immunosuppressive drugs, initial encounter: Secondary | ICD-10-CM

## 2019-09-07 DIAGNOSIS — Z66 Do not resuscitate: Secondary | ICD-10-CM

## 2019-09-07 DIAGNOSIS — E1121 Type 2 diabetes mellitus with diabetic nephropathy: Secondary | ICD-10-CM | POA: Diagnosis not present

## 2019-09-07 DIAGNOSIS — M1000 Idiopathic gout, unspecified site: Secondary | ICD-10-CM

## 2019-09-07 DIAGNOSIS — G620 Drug-induced polyneuropathy: Secondary | ICD-10-CM

## 2019-09-07 DIAGNOSIS — I10 Essential (primary) hypertension: Secondary | ICD-10-CM | POA: Diagnosis not present

## 2019-09-07 DIAGNOSIS — C184 Malignant neoplasm of transverse colon: Secondary | ICD-10-CM

## 2019-09-07 DIAGNOSIS — K219 Gastro-esophageal reflux disease without esophagitis: Secondary | ICD-10-CM

## 2019-09-07 DIAGNOSIS — Z794 Long term (current) use of insulin: Secondary | ICD-10-CM

## 2019-09-07 DIAGNOSIS — D696 Thrombocytopenia, unspecified: Secondary | ICD-10-CM

## 2019-09-07 MED ORDER — ROSUVASTATIN CALCIUM 5 MG PO TABS: 5.0000 mg | ORAL_TABLET | Freq: Every day | ORAL | 3 refills | Status: AC

## 2019-09-07 MED ORDER — METFORMIN HCL 1000 MG PO TABS: 1000.0000 mg | ORAL_TABLET | Freq: Two times a day (BID) | ORAL | 3 refills | Status: AC

## 2019-09-07 MED ORDER — QUINAPRIL HCL 5 MG PO TABS: 5.0000 mg | ORAL_TABLET | Freq: Every day | ORAL | 3 refills | Status: AC

## 2019-09-07 MED ORDER — ALLOPURINOL 300 MG PO TABS: 300.0000 mg | ORAL_TABLET | Freq: Every day | ORAL | 3 refills | Status: AC

## 2019-09-07 MED ORDER — PANTOPRAZOLE SODIUM 20 MG PO TBEC: 20.0000 mg | DELAYED_RELEASE_TABLET | Freq: Every day | ORAL | 3 refills | Status: AC

## 2019-09-07 MED ORDER — GLIPIZIDE ER 10 MG PO TB24: ORAL_TABLET | ORAL | 3 refills | Status: AC

## 2019-09-07 NOTE — Progress Notes (Signed)
Name: Donald Mcdowell.   MRN: [id number]    DOB: [birth year]   Date:09/07/2019       Progress Note  Chief Complaint  Patient presents with  . Follow-up  . Diabetes  . Hypertension  . Hyperlipidemia  . Medication Refill     Subjective:   Donald Mcdowell. is a 74 y.o. male, presents to clinic for   DM:    Currently managing with glipizide 10 mg, novolog 40 units in the morning, meformin 1000 BID Pt has no SE from meds - recently had glipizide reduced which he is not happy about - he is also on starlix Blood sugars 140's, higher than what he has been comfortable with Denies: Polyuria, polydipsia, vision changes, neuropathy, hypoglycemia Recent pertinent labs: Lab Results  Component Value Date   HGBA1C 6.2 (H) 12/26/2018   HGBA1C 5.7 (H) 05/03/2018   HGBA1C 5.9 (A) 01/28/2018   HGBA1C 5.9 01/28/2018   HGBA1C 5.9 01/28/2018   HGBA1C 5.9 01/28/2018   Hypertension:  Currently managed on quinapril  Pt reports good med compliance and denies any SE.  No lightheadedness, hypotension, syncope. Blood pressure today is mildly elevated systolic and low diastolic  - overall MAP okay and well controlled. BP Readings from Last 3 Encounters:  09/07/19 (!) 140/58  08/23/19 118/60  05/15/19 (!) 147/71   Pt denies CP, SOB, exertional sx, LE edema, palpitation, Ha's, visual disturbances  HLD- on crestor 5 mg daily, tolerating, no myalgias  Chemo induced thrombocytopenia - sees heme/onc On multiple supplements - iron and B12  Gout - managed with allopurinol 300 mg daily, no recent flares  GERD/gastritis - on protonix, per GI - with associated anemia managed by heme - no change or worsening in sx  Pr new to me, PCP recently left clinic, he will be establishing with new PCP at our same clinic - labs and refills due today  Pt reports colon cancer that is spreading and "killing him"  Reviewed last oncology visit 08/24/2019 with [facility]- Dx 10/2016, s/p right partial  colectomy, stage T2N2a in 2018, confirmed metastatin by [facility] 10/18/2018    Anemia/iron deficiency - on oral iron supplement, chronic SOB  Plan per oncology as of 6/10: Plan: 1.   Labs today: CBC with diff, CMP, CEA, ferritin. 2.Stage IIIB transverse colon cancer Clinically, heappears to be doing well. He remains asymptomatic regarding the hypermetabolic periportal lymph nodes documented on PET scan.  Pathology from endoscopic ultrasound at [facility] confirmed metastatic adenocarcinoma the colon. Foundation Onerevealedno targetable mutation. Discuss consideration of restaging studies (last 09/27/2018)- patient declines today Discuss consideration of treatment- patient declines today.             Previously discussed FOLFIRI.             Patient not interested as his disease is not curable. He would like to continue follow-up visits. 3.Stage I squamous cell carcinoma of the LEFT lung Clinically, he appears to be doing well. No imaging since 09/2018. 4.Shortness of breath             Patient has chronic shortness of breath.             Patient  Not interested in evaluation.             Encourage follow-up with his PCP. 5.   Iron deficiency Hematocrit 37.9.Hemoglobin 12.4. MCV93.6. Ferritin29. Ferritin goal is 100. Encourage patient to continue oral iron. 6.   RN to call patient with  ferritin and CEA level. 7.   Port flush every 6-12 weeks. 8.   RTC in 3 months for MD assessment, labs (CBC with diff, CMP, CEA, ferritin) and port flush.   Current Outpatient Medications:  .  ACCU-CHEK FASTCLIX LANCETS MISC, Check blood sugars three times daily Dx:E11.21, LON:99 months, Disp: 100 each, Rfl: 3 .  allopurinol (ZYLOPRIM) 300 MG tablet, Take 1 tablet (300 mg total) by mouth daily., Disp: 90 tablet, Rfl: 1 .  Blood Glucose Monitoring Suppl (ACCU-CHEK GUIDE ME) w/Device KIT, 1 Device by Does not apply route 3 (three) times daily.  Dx:E11.21, Lon:99, Disp: 1 kit, Rfl: 0 .  Blood Glucose Monitoring Suppl (ACCU-CHEK GUIDE ME) w/Device KIT, 1 applicator by Does not apply route 3 (three) times daily as needed. Check tid Dx: E11.21  LON:99, Disp: 1 kit, Rfl: 0 .  Blood Glucose Monitoring Suppl (ONE TOUCH ULTRA 2) w/Device KIT, 1 applicator by Does not apply route in the morning, at noon, and at bedtime. Check bs TID dX:E11.21 LON:99, Disp: 1 kit, Rfl: 0 .  Cyanocobalamin (VITAMIN B-12 PO), Take 1 tablet by mouth daily., Disp: , Rfl:  .  Ferrous Fumarate (HEMOCYTE - 106 MG FE) 324 (106 Fe) MG TABS tablet, Take 1 tablet by mouth., Disp: , Rfl:  .  glipiZIDE (GLUCOTROL XL) 10 MG 24 hr tablet, Take 1 tablet by mouth once daily with breakfast, Disp: 90 tablet, Rfl: 0 .  glucose blood (ACCU-CHEK GUIDE) test strip, USE 1 STRIP  TO CHECK BLOOD SUGARS THREE TIMES DAILY LON:99 Dx:E11.9, Disp: 100 each, Rfl: 3 .  glucose blood test strip, CHECK BS TID DX:E11.9 LON:99, Disp: 100 each, Rfl: 12 .  insulin NPH Human (NOVOLIN N RELION) 100 UNIT/ML injection, Inject 0.38 mLs (38 Units total) into the skin daily before breakfast., Disp: 10 mL, Rfl: 0 .  Insulin Pen Needle 31G X 6 MM MISC, Use to inject insulin subcutaneously once a day in the morning, Disp: 100 each, Rfl: 0 .  Lancets (ACCU-CHEK MULTICLIX) lancets, Check fingerstick blood sugars 3x a day; E11.29, LON:99 month, Disp: 100 each, Rfl: 12 .  Lancets (ONETOUCH ULTRASOFT) lancets, CHECK BS TID DX:E11.9, LON:99, Disp: 100 each, Rfl: 12 .  metFORMIN (GLUCOPHAGE) 1000 MG tablet, Take 1 tablet (1,000 mg total) by mouth 2 (two) times daily with a meal., Disp: 180 tablet, Rfl: 1 .  nateglinide (STARLIX) 60 MG tablet, TAKE 1 TABLET BY MOUTH THREE TIMES DAILY WITH MEALS, Disp: 90 tablet, Rfl: 0 .  pantoprazole (PROTONIX) 20 MG tablet, Take 1 tablet by mouth once daily, Disp: 90 tablet, Rfl: 0 .  quinapril (ACCUPRIL) 5 MG tablet, Take 1 tablet (5 mg total) by mouth daily., Disp: 90 tablet, Rfl: 0 .   rosuvastatin (CRESTOR) 5 MG tablet, Take 1 tablet (5 mg total) by mouth daily., Disp: 90 tablet, Rfl: 1 .  saw palmetto 160 MG capsule, Take 540 mg by mouth 2 (two) times daily. , Disp: , Rfl:  .  tamsulosin (FLOMAX) 0.4 MG CAPS capsule, Take 1 capsule by mouth once daily, Disp: 90 capsule, Rfl: 0 No current facility-administered medications for this visit.  Facility-Administered Medications Ordered in Other Visits:  .  heparin lock flush 100 unit/mL, 500 Units, Intravenous, Once, Lequita Asal, MD  Patient Active Problem List   Diagnosis Date Noted  . Do not resuscitate status 12/26/2018  . Advanced care planning/counseling discussion 12/26/2018  . Long-term insulin use ([place]) 05/03/2018  . Benign neoplasm of transverse colon   .  Complex renal cyst 09/22/2017  . Thrombocytopenia ([place]) 09/12/2017  . 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 Uncontrolled type 2 diabetes mellitus with diabetic nephropathy ([place]) 08/24/2017  . Chemotherapy-induced thrombocytopenia 08/20/2017  . Hyperglycemia 07/18/2017  . Hypomagnesemia 06/24/2017  . Essential hypertension, benign 03/28/2017  . Encounter for antineoplastic chemotherapy 03/15/2017  . Anxiety about health 03/01/2017  . Iron deficiency anemia 01/25/2017  . Malignant neoplasm of upper lobe of left lung ([place]) 01/14/2017  . Goals of care, counseling/discussion 01/14/2017  . Malignant neoplasm of ascending colon ([place]) 12/28/2016  . Malignant neoplasm of transverse colon ([place]) 11/23/2016  . Lung nodule 11/23/2016  . Benign neoplasm of descending colon   . Gastritis without bleeding   . Coronary artery calcification seen on CAT scan 09/15/2016  . Cyst, kidney, acquired 09/15/2016  . Abdominal aortic atherosclerosis ([place]) 09/15/2016  . Anemia 09/04/2016  . Encounter for medication monitoring 12/23/2015  . Hyperlipidemia LDL goal <70 12/23/2015  . Vitamin D deficiency 09/04/2015  . Morbid obesity  ([place]) 08/20/2015  . Gout 08/20/2015  . Chronic kidney disease (CKD), stage II (mild) 06/16/2013  . Bladder calculi 11/21/2011  . Frank hematuria 11/21/2011  . Incomplete bladder emptying 11/21/2011  . BPH with obstruction/lower urinary tract symptoms 11/19/2011  . Nephrolithiasis 11/19/2011  . Acalcerosis 11/19/2011  . Family history of malignant neoplasm of prostate 11/19/2011  . Neoplasm of uncertain behavior of urinary organ 11/19/2011    Past Surgical History:  Procedure Laterality Date  . COLONOSCOPY WITH PROPOFOL N/A 11/05/2016   Procedure: COLONOSCOPY WITH PROPOFOL;  Surgeon: Lucilla Lame, MD;  Location: [place];  Service: Gastroenterology;  Laterality: N/A;  . COLONOSCOPY WITH PROPOFOL N/A 02/15/2018   Procedure: COLONOSCOPY WITH PROPOFOL;  Surgeon: Lucilla Lame, MD;  Location[facility] ENDOSCOPY;  Service: Endoscopy;  Laterality: N/A;  . ELECTROMAGNETIC NAVIGATION BROCHOSCOPY N/A 12/16/2016   Procedure: ELECTROMAGNETIC NAVIGATION BRONCHOSCOPY;  Surgeon: Flora Lipps, MD;  Location: [facility];  Service: Cardiopulmonary;  Laterality: N/A;  . ESOPHAGOGASTRODUODENOSCOPY (EGD) WITH PROPOFOL N/A 11/05/2016   Procedure: ESOPHAGOGASTRODUODENOSCOPY (EGD) WITH PROPOFOL;  Surgeon: Lucilla Lame, MD;  Location: [place];  Service: Gastroenterology;  Laterality: N/A;  Diabetic - oral meds  . kidney stones    . PARTIAL COLECTOMY N/A 12/28/2016   Procedure: PARTIAL COLECTOMY RIGHT;  Surgeon: Jules Husbands, MD;  Location: [facility];  Service: General;  Laterality: N/A;  . POLYPECTOMY N/A 11/05/2016   Procedure: POLYPECTOMY;  Surgeon: Lucilla Lame, MD;  Location: [place];  Service: Gastroenterology;  Laterality: N/A;  . PORTACATH PLACEMENT Right 01/18/2017   Procedure: INSERTION PORT-A-CATH;  Surgeon: Jules Husbands, MD;  Location: [facility];  Service: General;  Laterality: Right;    Family History  Problem Relation Age of Onset  . Alzheimer's disease Mother   . Heart  disease Father   . Diabetes Father   . Cancer Father        Prostate  . Cancer Sister        skin cancer    Social History   Tobacco Use  . Smoking status: Former Smoker    Types: Cigarettes    Quit date: 06/2016    Years since quitting: 3.2  . Smokeless tobacco: Never Used  Vaping Use  . Vaping Use: Never used  Substance Use Topics  . Alcohol use: No    Alcohol/week: 0.0 standard drinks  . Drug use: No     No Known Allergies  Health Maintenance  Topic Date Due  . COVID-19 Vaccine (1) Never done  . FOOT EXAM  05/04/2019  . HEMOGLOBIN A1C  06/26/2019  . OPHTHALMOLOGY EXAM  08/16/2019  . INFLUENZA VACCINE  10/15/2019  . TETANUS/TDAP  01/16/2020  . COLONOSCOPY  02/16/2023  . Hepatitis C Screening  Completed  . PNA vac Low Risk Adult  Completed    Chart Review Today: I personally reviewed active problem list, medication list, allergies, family history, social history, health maintenance, notes from last encounter, lab results, imaging with the patient/caregiver today.   Review of Systems  10 Systems reviewed and are negative for acute change except as noted in the HPI.  Objective:   Vitals:   09/07/19 1345  BP: (!) 140/58  Pulse: 87  Resp: 16  Temp: 97.6 F (36.4 C)  TempSrc: Temporal  SpO2: 99%  Weight: 286 lb (129.7 kg)  Height: 5' 11"  (1.803 m)    Body mass index is 39.89 kg/m.  Physical Exam Vitals and nursing note reviewed.  Constitutional:      General: He is not in acute distress.    Appearance: Normal appearance. He is well-developed. He is obese. He is not ill-appearing, toxic-appearing or diaphoretic.     Interventions: Face mask in place.  HENT:     Head: Normocephalic and atraumatic.     Jaw: No trismus.     Right Ear: External ear normal.     Left Ear: External ear normal.  Eyes:     General: Lids are normal. No scleral icterus.    Conjunctiva/sclera: Conjunctivae normal.     Pupils: Pupils are equal, round, and reactive to light.   Neck:     Trachea: Trachea and phonation normal. No tracheal deviation.  Cardiovascular:     Rate and Rhythm: Normal rate and regular rhythm.     Pulses: Normal pulses.          Radial pulses are 2+ on the right side and 2+ on the left side.       Posterior tibial pulses are 2+ on the right side and 2+ on the left side.     Heart sounds: Normal heart sounds. No murmur heard.  No friction rub. No gallop.   Pulmonary:     Effort: Pulmonary effort is normal. No respiratory distress.     Breath sounds: Normal breath sounds. No stridor. No wheezing, rhonchi or rales.  Chest:     Comments: Port to chest - no ttp Abdominal:     General: Bowel sounds are normal. There is no distension.     Palpations: Abdomen is soft.     Comments: Obese protuberant abd  Musculoskeletal:     Cervical back: Normal range of motion and neck supple.  Skin:    General: Skin is warm and dry.     Coloration: Skin is not jaundiced.     Findings: No bruising or rash.     Nails: There is no clubbing.  Neurological:     Mental Status: He is alert.     Cranial Nerves: No dysarthria or facial asymmetry.     Motor: No tremor or abnormal muscle tone.     Gait: Gait normal.  Psychiatric:        Mood and Affect: Mood normal.        Speech: Speech normal.        Behavior: Behavior normal. Behavior is cooperative.         Assessment & Plan:     ICD-10-CM  1. Controlled diabetes mellitus with nephropathy (HCC)  E11.21 Hemoglobin A1C    glipiZIDE (GLUCOTROL XL) 10 MG 24 hr tablet    metFORMIN (GLUCOPHAGE) 1000 MG tablet    quinapril (ACCUPRIL) 5 MG tablet    rosuvastatin (CRESTOR) 5 MG tablet   well controlled, discussed goals for DM management esp with metastatin CA prognosis, avoid med interactions with high risk for hypoglycemia  2. Hyperlipidemia LDL goal <70  E78.5 Lipid panel   compliant and tolerant of statin, though with prognosis, pt may consider d/c statin med  3. Essential hypertension, benign   I10 quinapril (ACCUPRIL) 5 MG tablet   stable, well controlled with current meds  4. Abdominal aortic atherosclerosis (HCC)  I70.0 Lipid panel    rosuvastatin (CRESTOR) 5 MG tablet   on statin, monitoring - again likely of little clinical concern for pt  5. Idiopathic gout, unspecified chronicity, unspecified site  M10.00 allopurinol (ZYLOPRIM) 300 MG tablet    Uric acid   stable and currently well controlled on 300 mg allopurinol dose  6. Gastroesophageal reflux disease without esophagitis  K21.9 pantoprazole (PROTONIX) 20 MG tablet   per GI - on protonix - long term use for sx control is currently more important than long term PPI use SE  7. Malignant neoplasm of transverse colon ([place])  C18.4    per oncology - currently just monitoring, no agressive tx, recently pt decided on this and DNR status with Oncology  8. Thrombocytopenia ([place])  D69.6    per hematology  9. Long-term insulin use (HCC)  Z79.4    low dose in well controlled DM - I discussed with pt d/c glipizide and/or starlix, as they all do similar things and all can cause hypoglycemia, pt resistant   10. Do not resuscitate status  Z66    per documented OV with oncology - discussed goals of care with metastatic CRC  11. Neuropathy due to chemotherapeutic drug ([place])  G62.0    T45.1X5A    sx tolerable, not on any meds currenty for neuropathy  12. Morbid obesity ([place]) Chronic E66.01    multiple associated chronic conditions HTN, HLD, DM    I explained regarding his DM meds - he wanted refills of everything - I told him I was unfamiliar with one of his medications and would need to review it prior to refilling/prescribing. I explained and education pt about DM management and goals - for his age and other med conditions A1C from 7-8 would likely be adequate to maintain his health relative to DM and there is no need to aggressively pursuing tighter glycemic control with the risk of hypoglycemia being much more dangerous to his health.  I  explained how his meds interact - I would like to simplify meds to only metformin and glipizide or metformin and insulin, but pt was very resistant to med changes.  I encouraged checking CBGs fasting in the morning daily - OK with 80-150 blood glucose   Return in about 6 months (around 03/08/2020) for Routine follow-up (DM/A1C/HTN) with PCP - Dr. Lemmie Evens?.   Delsa Grana, PA-C 09/07/19 1:59 PM

## 2019-09-08 LAB — LIPID PANEL
Cholesterol: 101 mg/dL (ref <200)
HDL: 29 mg/dL — ABNORMAL LOW (ref >=40)
LDL Cholesterol (Calc): 44 mg/dL (calc)
Non-HDL Cholesterol (Calc): 72 mg/dL (calc) (ref <130)
Total CHOL/HDL Ratio: 3.5 (calc) (ref <5.0)
Triglycerides: 215 mg/dL — ABNORMAL HIGH (ref <150)

## 2019-09-08 LAB — HEMOGLOBIN A1C
Hgb A1c MFr Bld: 6.7 % of total Hgb — ABNORMAL HIGH (ref <5.7)
Mean Plasma Glucose: 146 (calc)
eAG (mmol/L): 8.1 (calc)

## 2019-09-08 LAB — URIC ACID: Uric Acid, Serum: 5 mg/dL (ref 4.0–8.0)

## 2019-09-13 ENCOUNTER — Ambulatory Visit: Payer: Medicare Other | Admitting: Internal Medicine

## 2019-09-14 ENCOUNTER — Telehealth: Payer: Self-pay | Admitting: Internal Medicine

## 2019-09-14 NOTE — Telephone Encounter (Signed)
Patient declined the Medicare Wellness Visit with [facility] - no reason given

## 2019-09-15 ENCOUNTER — Telehealth: Payer: Self-pay | Admitting: Internal Medicine

## 2019-09-15 ENCOUNTER — Other Ambulatory Visit: Payer: Self-pay | Admitting: Internal Medicine

## 2019-09-15 DIAGNOSIS — E1121 Type 2 diabetes mellitus with diabetic nephropathy: Secondary | ICD-10-CM

## 2019-09-15 MED ORDER — NATEGLINIDE 60 MG PO TABS: 60.0000 mg | ORAL_TABLET | Freq: Three times a day (TID) | ORAL | 0 refills | Status: DC

## 2019-09-15 NOTE — Telephone Encounter (Signed)
Refill Nateglinide 60mg  tablets  [facility]

## 2019-09-16 ENCOUNTER — Encounter: Payer: Self-pay | Admitting: Family Medicine

## 2019-10-06 ENCOUNTER — Other Ambulatory Visit: Payer: Self-pay | Admitting: Internal Medicine

## 2019-10-06 DIAGNOSIS — E1121 Type 2 diabetes mellitus with diabetic nephropathy: Secondary | ICD-10-CM

## 2019-10-06 NOTE — Telephone Encounter (Signed)
Could you please contact the patient to see if he is on this medication, Starlix. In Leisa's last note in June, she did not note that he was on this for his diabetes. It was on his med list at that time. It is not clear if he is taking this medicine presently. I have not seen this patient to date. Thanks [facility]

## 2019-10-14 ENCOUNTER — Encounter: Payer: Self-pay | Admitting: Family Medicine

## 2019-10-14 DIAGNOSIS — G620 Drug-induced polyneuropathy: Secondary | ICD-10-CM | POA: Insufficient documentation

## 2019-10-14 DIAGNOSIS — T451X5A Adverse effect of antineoplastic and immunosuppressive drugs, initial encounter: Secondary | ICD-10-CM | POA: Insufficient documentation

## 2019-10-17 ENCOUNTER — Ambulatory Visit: Payer: Medicare Other | Admitting: Urology

## 2019-10-18 ENCOUNTER — Encounter: Payer: Self-pay | Admitting: Urology

## 2019-10-18 ENCOUNTER — Ambulatory Visit
Admission: RE | Admit: 2019-10-18 | Discharge: 2019-10-18 | Disposition: A | Discharge status: Home / Self Care | Payer: Medicare Other | Source: Ambulatory Visit | Attending: Urology | Admitting: Urology

## 2019-10-18 ENCOUNTER — Ambulatory Visit
Admission: RE | Admit: 2019-10-18 | Discharge: 2019-10-18 | Disposition: A | Discharge status: Home / Self Care | Payer: Medicare Other | Attending: Urology | Admitting: Urology

## 2019-10-18 ENCOUNTER — Ambulatory Visit (INDEPENDENT_AMBULATORY_CARE_PROVIDER_SITE_OTHER): Payer: Medicare Other | Admitting: Urology

## 2019-10-18 ENCOUNTER — Other Ambulatory Visit: Payer: Self-pay

## 2019-10-18 VITALS — BP 122/73 | HR 112 | Ht 71.0 in | Wt 271.0 lb

## 2019-10-18 DIAGNOSIS — N401 Enlarged prostate with lower urinary tract symptoms: Secondary | ICD-10-CM | POA: Diagnosis not present

## 2019-10-18 DIAGNOSIS — Z87442 Personal history of urinary calculi: Secondary | ICD-10-CM | POA: Diagnosis not present

## 2019-10-18 DIAGNOSIS — M47816 Spondylosis without myelopathy or radiculopathy, lumbar region: Secondary | ICD-10-CM | POA: Diagnosis not present

## 2019-10-18 LAB — BLADDER SCAN AMB NON-IMAGING: Scan Result: 0

## 2019-10-18 MED ORDER — TAMSULOSIN HCL 0.4 MG PO CAPS: 0.4000 mg | ORAL_CAPSULE | Freq: Every day | ORAL | 3 refills | Status: AC

## 2019-10-18 NOTE — Addendum Note (Signed)
Addended by: Kyra Manges on: 10/18/2019 01:43 PM   Modules accepted: Orders

## 2019-10-18 NOTE — Progress Notes (Signed)
10/18/2019 10:41 AM   Donald Mcdowell [birth year] [id number]  Referring provider: Hubbard Hartshorn, [place],   [address]  Chief Complaint  Patient presents with   Benign Prostatic Hypertrophy    Urologic history:  1.  Bilateral nephrolithiasis              -prior shockwave lithotripsy, ureteroscopy and cystolitholapaxy  2.BPH with lower urinary tract symptoms             -on tamsulosin  3.  Renal cysts             -Simple and Bosniak 2  HPI: 74 y.o. male presents for annual follow-up.   Since visit last year with progression to stage IV metastatic colon cancer  Saw medical oncology 08/2019 and declined further imaging  Passed 3 stones April 2021 the largest was 6 mm  Having mild left flank pain and intermittent gross hematuria for the past 1 month  Stable lower urinary tract symptoms on tamsulosin   PMH: Past Medical History:  Diagnosis Date   Abdominal aortic atherosclerosis ([place]) 09/15/2016   CT scan June 2018   Allergy    Cancer [facility])    Coronary artery calcification seen on CAT scan 09/15/2016   Previous smoker; noted on CT scan June 2018   Diabetes mellitus without complication ([place])    GERD (gastroesophageal reflux disease)    Gout    Hyperlipidemia    Hypertension    Kidney function abnormal    stage 2   Kidney stones    Obesity 08/20/2015    Surgical History: Past Surgical History:  Procedure Laterality Date   COLONOSCOPY WITH PROPOFOL N/A 11/05/2016   Procedure: COLONOSCOPY WITH PROPOFOL;  Surgeon: Lucilla Lame, MD;  Location: [place];  Service: Gastroenterology;  Laterality: N/A;   COLONOSCOPY WITH PROPOFOL N/A 02/15/2018   Procedure: COLONOSCOPY WITH PROPOFOL;  Surgeon: Lucilla Lame, MD;  [facility] END[facility]COPY;  Service: Endoscopy;  Laterality: N/A;   ELECTROMAGNETIC NAVIGATION BROCHOSCOPY N/A 12/16/2016   Procedure: ELECTROMAGNETIC NAVIGATION BRONCHOSCOPY;  Surgeon: Flora Lipps, MD;  Location: [facility];  Service: Cardiopulmonary;  Laterality: N/A;   ESOPHAGOGASTRODUODENOSCOPY (EGD) WITH PROPOFOL N/A 11/05/2016   Procedure: ESOPHAGOGASTRODUODENOSCOPY (EGD) WITH PROPOFOL;  Surgeon: Lucilla Lame, MD;  Location: [place];  Service: Gastroenterology;  Laterality: N/A;  Diabetic - oral meds   kidney stones     PARTIAL COLECTOMY N/A 12/28/2016   Procedure: PARTIAL COLECTOMY RIGHT;  Surgeon: Jules Husbands, MD;  Location: [facility];  Service: General;  Laterality: N/A;   POLYPECTOMY N/A 11/05/2016   Procedure: POLYPECTOMY;  Surgeon: Lucilla Lame, MD;  Location: [place];  Service: Gastroenterology;  Laterality: N/A;   PORTACATH PLACEMENT Right 01/18/2017   Procedure: INSERTION PORT-A-CATH;  Surgeon: Jules Husbands, MD;  Location: [facility];  Service: General;  Laterality: Right;    Home Medications:  Allergies as of 10/18/2019   No Known Allergies     Medication List       Accurate as of October 18, 2019 10:41 AM. If you have any questions, ask your nurse or doctor.        STOP taking these medications   Ferrous Fumarate 324 (106 Fe) MG Tabs tablet Commonly known as: HEMOCYTE - 106 mg FE Stopped by: Abbie Sons, MD   insulin NPH Human 100 UNIT/ML injection Commonly known as: NovoLIN N ReliOn Stopped by: Abbie Sons, MD     TAKE these  medications   Accu-Chek FastClix Lancets Misc Check blood sugars three times daily Dx:E11.21, LON:99 months What changed: Another medication with the same name was removed. Continue taking this medication, and follow the directions you see here. Changed by: Abbie Sons, MD   allopurinol 300 MG tablet Commonly known as: ZYLOPRIM Take 1 tablet (300 mg total) by mouth daily.   glipiZIDE 10 MG 24 hr tablet Commonly known as: GLUCOTROL XL Take 1 tablet by mouth once daily with breakfast   glucose blood test strip CHECK BS TID DX:E11.9 LON:99 What changed: Another medication with the same  name was removed. Continue taking this medication, and follow the directions you see here. Changed by: Abbie Sons, MD   Insulin Pen Needle 31G X 6 MM Misc Use to inject insulin subcutaneously once a day in the morning   metFORMIN 1000 MG tablet Commonly known as: GLUCOPHAGE Take 1 tablet (1,000 mg total) by mouth 2 (two) times daily with a meal.   nateglinide 60 MG tablet Commonly known as: STARLIX TAKE 1 TABLET BY MOUTH THREE TIMES DAILY WITH MEALS   ONE TOUCH ULTRA 2 w/Device Kit 1 applicator by Does not apply route in the morning, at noon, and at bedtime. Check bs TID dX:E11.21 LON:99 What changed: Another medication with the same name was removed. Continue taking this medication, and follow the directions you see here. Changed by: Abbie Sons, MD   pantoprazole 20 MG tablet Commonly known as: PROTONIX Take 1 tablet (20 mg total) by mouth daily.   quinapril 5 MG tablet Commonly known as: ACCUPRIL Take 1 tablet (5 mg total) by mouth daily.   rosuvastatin 5 MG tablet Commonly known as: Crestor Take 1 tablet (5 mg total) by mouth daily.   saw palmetto 160 MG capsule Take 540 mg by mouth 2 (two) times daily.   tamsulosin 0.4 MG Caps capsule Commonly known as: FLOMAX Take 1 capsule by mouth once daily   VITAMIN B-12 PO Take 1 tablet by mouth daily.       Allergies: No Known Allergies  Family History: Family History  Problem Relation Age of Onset   Alzheimer's disease Mother    Heart disease Father    Diabetes Father    Cancer Father        Prostate   Cancer Sister        skin cancer    Social History:  reports that he quit smoking about 3 years ago. His smoking use included cigarettes. He has never used smokeless tobacco. He reports that he does not drink alcohol and does not use drugs.   Physical Exam: BP 122/73    Pulse (!) 112    Ht _0  (1.803 m)    Wt 271 lb (122.9 kg)    BMI 37.80 kg/m   Constitutional:  Alert and oriented, No acute  distress. HEENT: [place] AT, moist mucus membranes.  Trachea midline, no masses. Cardiovascular: No clubbing, cyanosis, or edema. Respiratory: Normal respiratory effort, no increased work of breathing. Skin: No rashes, bruises or suspicious lesions. Neurologic: Grossly intact, no focal deficits, moving all 4 extremities. Psychiatric: Normal mood and affect.    Assessment & Plan:    1. Benign prostatic hyperplasia with LUTS  Stable on tamsulosin, refilled  PVR by bladder scan 0 mL  2.  Nephrolithiasis  Passed 3 stones April 2021  Mild left flank pain  He was agreeable to a KUB today and will call with results  3.  Johney [place]  hematuria  New problem  Would be considered high risk hematuria, has declined further CT imaging with medical oncology  If a stone identified on KUB this could be the source of his hematuria  Cystoscopy was also discussed  For persistent hematuria he may agree to proceeding with CT urogram/cystoscopy but would like to know KUB results first   Abbie Sons, MD  [facility] [address] [place],  [address] [phone]

## 2019-10-20 ENCOUNTER — Telehealth: Payer: Self-pay

## 2019-10-20 LAB — URINALYSIS, COMPLETE
Bilirubin, UA: POSITIVE — AB
Glucose, UA: NEGATIVE
Ketones, UA: NEGATIVE
Nitrite, UA: NEGATIVE
Protein,UA: NEGATIVE
Specific Gravity, UA: 1.02 (ref 1.005–1.030)
Urobilinogen, Ur: 0.2 mg/dL (ref 0.2–1.0)
pH, UA: 5 (ref 5.0–7.5)

## 2019-10-20 LAB — MICROSCOPIC EXAMINATION: Bacteria, UA: NONE SEEN

## 2019-10-20 NOTE — Telephone Encounter (Signed)
Patient's care giver was notified and voiced understanding.

## 2019-10-20 NOTE — Telephone Encounter (Signed)
Pt's family friend [name] who handles his appointments and medical things (listed on DPR) calls and requests pt KUB results.

## 2019-10-20 NOTE — Telephone Encounter (Signed)
KUB shows bilateral, nonobstructing lower pole calculi.  No ureteral calculi are seen.  At the office visit earlier this week we discussed getting a CT scan if he desires further evaluation of his left flank pain.

## 2019-10-24 NOTE — Progress Notes (Signed)
[facility]  [address], [address] [place], [place] [address] Phone: [phone]  Fax: [phone]   Clinic Day:  10/25/2019  Referring physician: Hubbard Hartshorn, FNP  Chief Complaint: Donald Mcdowell. is a 74 y.o. male with squamous cell lung cancer s/p SBRT and stage IV colon cancer who is seen for sick call visit.  HPI: The patient was last seen in the medical oncology clinic on 08/24/2019.  At that time, he had chronic shortness of breath.  Exam was stable. We discussed consideration of treatment and restaging studies; he declined both.  The patient saw Dr. Bernardo Heater on 10/18/2019 for an annual follow up. He reported left flank pain and intermittent hematuria x 1 month. Abdominal x ray revealed bilateral renal calculi. No acute abnormality was seen. Urinalysis revealed 1+ RBC, 1+ leukocytes, and positive bilirubin.  Labs on 08/24/2019 revealed a hematocrit 37.9, hemoglobin 12.4, MCV 93.6, platelets 154,000, WBC 7,200.  Creatinine was 1.39 (CrCl 50 ml/min). Ferritin 29. CEA 9.2.  During the interim, he has been "ok".  His shortness of breath is stable. He has had loose stools and hematuria for the past month. He denies flank pain, nausea, and diarrhea. He still has lower back pain at night, which causes him to toss and turn. He uses the bathroom 4-5 x per night.  We discussed that the patient's hepatomegaly on exam and elevated LFTs are likely indicative of metastatic disease to the liver. We discussed imaging studies (CT scan or ultrasound) to confirm metastatic disease to the liver.  He may be a candidate for stent placement if he has obstruction.  We discussed the implications of a rising bilirubin.  His life expectantly is limited with progressive metastatic colon cancer to the liver with rapidly rising bilirubin.  We discussed the benefits of imaging studies including CT and ultrasound. The patient voiced understanding and would like a couple of days to  think about whether or not he would like to pursue further imaging. The patient has a DNR/DNI.  He is open to having Hospice visit him at home. He was given a MOST form today.   Past Medical History:  Diagnosis Date  . Abdominal aortic atherosclerosis ([place]) 09/15/2016   CT scan June 2018  . Allergy   . Cancer ([place])   . Coronary artery calcification seen on CAT scan 09/15/2016   Previous smoker; noted on CT scan June 2018  . Diabetes mellitus without complication ([place])   . GERD (gastroesophageal reflux disease)   . Gout   . Hyperlipidemia   . Hypertension   . Kidney function abnormal    stage 2  . Kidney stones   . Obesity 08/20/2015    Past Surgical History:  Procedure Laterality Date  . COLONOSCOPY WITH PROPOFOL N/A 11/05/2016   Procedure: COLONOSCOPY WITH PROPOFOL;  Surgeon: Lucilla Lame, MD;  Location: [facility];  Service: Gastroenterology;  Laterality: N/A;  . COLONOSCOPY WITH PROPOFOL N/A 02/15/2018   Procedure: COLONOSCOPY WITH PROPOFOL;  Surgeon: Lucilla Lame, MD;  Location: [facility] ENDOSCOPY;  Service: Endoscopy;  Laterality: N/A;  . ELECTROMAGNETIC NAVIGATION BROCHOSCOPY N/A 12/16/2016   Procedure: ELECTROMAGNETIC NAVIGATION BRONCHOSCOPY;  Surgeon: Flora Lipps, MD;  Location: [facility];  Service: Cardiopulmonary;  Laterality: N/A;  . ESOPHAGOGASTRODUODENOSCOPY (EGD) WITH PROPOFOL N/A 11/05/2016   Procedure: ESOPHAGOGASTRODUODENOSCOPY (EGD) WITH PROPOFOL;  Surgeon: Lucilla Lame, MD;  Location: [facility];  Service: Gastroenterology;  Laterality: N/A;  Diabetic - oral meds  . kidney stones    .  PARTIAL COLECTOMY N/A 12/28/2016   Procedure: PARTIAL COLECTOMY RIGHT;  Surgeon: Jules Husbands, MD;  Location: [facility];  Service: General;  Laterality: N/A;  . POLYPECTOMY N/A 11/05/2016   Procedure: POLYPECTOMY;  Surgeon: Lucilla Lame, MD;  Location: [facility];  Service: Gastroenterology;  Laterality: N/A;  . PORTACATH PLACEMENT Right 01/18/2017   Procedure:  INSERTION PORT-A-CATH;  Surgeon: Jules Husbands, MD;  Location: [facility];  Service: General;  Laterality: Right;    Family History  Problem Relation Age of Onset  . Alzheimer's disease Mother   . Heart disease Father   . Diabetes Father   . Cancer Father        Prostate  . Cancer Sister        skin cancer    Social History:  reports that he quit smoking about 3 years ago. His smoking use included cigarettes. He has never used smokeless tobacco. He reports that he does not drink alcohol and does not use drugs. Patient worked for a [employer]. He retired in 1996. He was in his 80s. Patient was a 1-2 pack per day smoker since he was a child. He stopped smoking in 07/2016.He drank alcohol in the past. He has4 sisters,3of which areliving.He has no children. Patient's wife is [name].He communicates regularlywith hisneighbor [name], who can be contacted [phone]. The patient is accompanied by [name] via iPad today.  Allergies: No Known Allergies  Current Medications: Current Outpatient Medications  Medication Sig Dispense Refill  . ACCU-CHEK FASTCLIX LANCETS MISC Check blood sugars three times daily Dx:E11.21, LON:99 months 100 each 3  . allopurinol (ZYLOPRIM) 300 MG tablet Take 1 tablet (300 mg total) by mouth daily. 90 tablet 3  . Blood Glucose Monitoring Suppl (ONE TOUCH ULTRA 2) w/Device KIT 1 applicator by Does not apply route in the morning, at noon, and at bedtime. Check bs TID dX:E11.21 LON:99 1 kit 0  . Cyanocobalamin (VITAMIN B-12 PO) Take 1 tablet by mouth daily.    Marland Kitchen glipiZIDE (GLUCOTROL XL) 10 MG 24 hr tablet Take 1 tablet by mouth once daily with breakfast 90 tablet 3  . glucose blood test strip CHECK BS TID DX:E11.9 LON:99 100 each 12  . Insulin Pen Needle 31G X 6 MM MISC Use to inject insulin subcutaneously once a day in the morning 100 each 0  . metFORMIN (GLUCOPHAGE) 1000 MG tablet Take 1 tablet (1,000 mg total) by mouth 2 (two) times daily  with a meal. 180 tablet 3  . nateglinide (STARLIX) 60 MG tablet TAKE 1 TABLET BY MOUTH THREE TIMES DAILY WITH MEALS 90 tablet 3  . pantoprazole (PROTONIX) 20 MG tablet Take 1 tablet (20 mg total) by mouth daily. 90 tablet 3  . quinapril (ACCUPRIL) 5 MG tablet Take 1 tablet (5 mg total) by mouth daily. 90 tablet 3  . rosuvastatin (CRESTOR) 5 MG tablet Take 1 tablet (5 mg total) by mouth daily. 90 tablet 3  . saw palmetto 160 MG capsule Take 540 mg by mouth 2 (two) times daily.     . tamsulosin (FLOMAX) 0.4 MG CAPS capsule Take 1 capsule (0.4 mg total) by mouth daily. 90 capsule 3   No current facility-administered medications for this visit.   Facility-Administered Medications Ordered in Other Visits  Medication Dose Route Frequency Provider Last Rate Last Admin  . heparin lock flush 100 unit/mL  500 Units Intravenous Once Tyvion Edmondson C, MD      . heparin lock flush 100 unit/mL  500 Units Intravenous Once Kalayna Noy C, MD      . sodium chloride flush (NS) 0.9 % injection 10 mL  10 mL Intravenous PRN Nolon Stalls C, MD   10 mL at 10/25/19 1455   Review of Systems  Constitutional: Positive for weight loss (12 lbs). Negative for chills, diaphoresis, fever and malaise/fatigue.  HENT: Positive for hearing loss. Negative for congestion, ear discharge, ear pain, nosebleeds, sinus pain, sore throat and tinnitus.   Eyes: Negative.  Negative for blurred vision, double vision and photophobia.  Respiratory: Positive for shortness of breath (minimal exertion). Negative for cough, hemoptysis, sputum production and wheezing.   Cardiovascular: Negative.  Negative for chest pain, palpitations, orthopnea, leg swelling and PND.  Gastrointestinal: Negative for abdominal pain, blood in stool, constipation, diarrhea (loose stools), heartburn, melena, nausea and vomiting.       Eating well.  No change in stool color.  Genitourinary: Positive for hematuria (x 1 month). Negative for dysuria,  frequency and urgency.       Kidney stones. Dark urine.  Musculoskeletal: Positive for back pain (lower back; at night). Negative for falls, joint pain, myalgias and neck pain.  Skin: Negative for itching and rash.       Jaundice.  Neurological: Negative.  Negative for dizziness, tremors, sensory change, speech change, focal weakness, weakness and headaches.  Endo/Heme/Allergies: Does not bruise/bleed easily.       Diabetes.  Psychiatric/Behavioral: Positive for memory loss (mild). Negative for depression and substance abuse. The patient is not nervous/anxious and does not have insomnia.   All other systems reviewed and are negative.  Performance status (ECOG):  1  Vitals Blood pressure 138/69, pulse 100, temperature 98.8 F (37.1 C), temperature source Tympanic, resp. rate 18, height 5' 11"  (1.803 m), weight 272 lb 9.6 oz (123.6 kg), SpO2 100 %.   Physical Exam Vitals and nursing note reviewed.  Constitutional:      General: He is not in acute distress.    Appearance: He is well-developed. He is not diaphoretic.  HENT:     Head: Normocephalic and atraumatic.     Mouth/Throat:     Mouth: Mucous membranes are moist.     Pharynx: Oropharynx is clear. No oropharyngeal exudate.  Eyes:     General: Scleral icterus present.     Extraocular Movements: Extraocular movements intact.     Pupils: Pupils are equal, round, and reactive to light.     Comments: Glasses.  Blue eyes.  Left amblyopia.  Neck:     Vascular: No JVD.  Cardiovascular:     Rate and Rhythm: Normal rate and regular rhythm.     Heart sounds: Normal heart sounds. No murmur heard.   Pulmonary:     Effort: Pulmonary effort is normal. No respiratory distress.     Breath sounds: Normal breath sounds. No wheezing or rales.  Chest:     Chest wall: No tenderness.  Abdominal:     General: Bowel sounds are normal. There is no distension.     Palpations: Abdomen is soft. There is hepatomegaly (liver palpable 10 cm below right  costal margin and extending 5 cm past midsternal line). There is no mass.     Tenderness: There is no abdominal tenderness. There is no guarding or rebound.  Musculoskeletal:        General: No tenderness. Normal range of motion.     Cervical back: Normal range of motion and neck supple.  Lymphadenopathy:     Head:  Right side of head: No preauricular, posterior auricular or occipital adenopathy.     Left side of head: No preauricular, posterior auricular or occipital adenopathy.     Cervical: No cervical adenopathy.     Upper Body:     Right upper body: No supraclavicular or axillary adenopathy.     Left upper body: No supraclavicular or axillary adenopathy.     Lower Body: No right inguinal adenopathy. No left inguinal adenopathy.  Skin:    General: Skin is warm and dry.     Coloration: Skin is jaundiced.  Neurological:     Mental Status: He is alert and oriented to person, place, and time.  Psychiatric:        Behavior: Behavior normal.        Thought Content: Thought content normal.        Judgment: Judgment normal.    Infusion on 10/25/2019  Component Date Value Ref Range Status  . Sodium 10/25/2019 133* 135 - 145 mmol/L Final  . Potassium 10/25/2019 3.8  3.5 - 5.1 mmol/L Final  . Chloride 10/25/2019 105  98 - 111 mmol/L Final  . CO2 10/25/2019 19* 22 - 32 mmol/L Final  . Glucose, Bld 10/25/2019 108* 70 - 99 mg/dL Final   Glucose reference range applies only to samples taken after fasting for at least 8 hours.  . BUN 10/25/2019 29* 8 - 23 mg/dL Final  . Creatinine, Ser 10/25/2019 1.17  0.61 - 1.24 mg/dL Final  . Calcium 10/25/2019 8.8* 8.9 - 10.3 mg/dL Final  . Total Protein 10/25/2019 7.4  6.5 - 8.1 g/dL Final  . Albumin 10/25/2019 3.0* 3.5 - 5.0 g/dL Final  . AST 10/25/2019 191* 15 - 41 U/L Final  . ALT 10/25/2019 208* 0 - 44 U/L Final  . Alkaline Phosphatase 10/25/2019 458* 38 - 126 U/L Final  . Total Bilirubin 10/25/2019 11.2* 0.3 - 1.2 mg/dL Final  . GFR calc  non Af Amer 10/25/2019 >60  >60 mL/min Final  . GFR calc Af Amer 10/25/2019 >60  >60 mL/min Final  . Anion gap 10/25/2019 9  5 - 15 Final   Performed at [facility], [address], [place], [place] [address]  . WBC 10/25/2019 6.0  4.0 - 10.5 K/uL Final  . RBC 10/25/2019 3.31* 4.22 - 5.81 MIL/uL Final  . Hemoglobin 10/25/2019 10.1* 13.0 - 17.0 g/dL Final  . HCT 10/25/2019 31.3* 39 - 52 % Final  . MCV 10/25/2019 94.6  80.0 - 100.0 fL Final  . MCH 10/25/2019 30.5  26.0 - 34.0 pg Final  . MCHC 10/25/2019 32.3  30.0 - 36.0 g/dL Final  . RDW 10/25/2019 18.2* 11.5 - 15.5 % Final  . Platelets 10/25/2019 208  150 - 400 K/uL Final  . nRBC 10/25/2019 0.0  0.0 - 0.2 % Final  . Neutrophils Relative % 10/25/2019 62  % Final  . Neutro Abs 10/25/2019 3.8  1.7 - 7.7 K/uL Final  . Lymphocytes Relative 10/25/2019 23  % Final  . Lymphs Abs 10/25/2019 1.4  0.7 - 4.0 K/uL Final  . Monocytes Relative 10/25/2019 6  % Final  . Monocytes Absolute 10/25/2019 0.4  0 - 1 K/uL Final  . Eosinophils Relative 10/25/2019 7  % Final  . Eosinophils Absolute 10/25/2019 0.4  0 - 0 K/uL Final  . Basophils Relative 10/25/2019 1  % Final  . Basophils Absolute 10/25/2019 0.1  0 - 0 K/uL Final  . Immature Granulocytes 10/25/2019 1  %  Final  . Abs Immature Granulocytes 10/25/2019 0.05  0.00 - 0.07 K/uL Final   Performed at [facility], [address] [place], [place] [address]    Assessment:  Dayshawn Irizarry. is a 74 y.o. male with stage IIIB transverse colon cancers/p right partial colectomy on 12/28/2016. Pathology revealed a 5.5 cm grade II invasive adenocarcinoma of the transverse colon, extending through the muscularis propria and into the adjacent adipose tissue, including the greater omentum. There was metastatic adenocarcinoma in multiple lymph nodes and tumor deposits, at least 5 involved with a total of at least 18 lymph nodes (5 of 18). Pathologic stagewas T3N2a.CEAwas 1.4 on  11/09/2016.  Abdomen and pelvic CTon 11/09/2016 revealed mid transverse colon segment of luminal narrowing with wall thickening compatible with colon cancer. There was a single enlarged adjacent mesenteric lymph node and prominent subcentimeter lymph nodes anterior to the pancreas which may be metastatic.   Chest CTon 11/18/2016 revealed no metastatic disease. There was abrupt termination of the left upper lobe beyond which the distal bronchus appeared dilated with mild branching into the lung parenchyma. Imaging suggested a bronchocele in the medial aspect of the left upper lobe. Differential included an endobronchial polyp, endobronchial tumor, bronchial stricture or aspirated foreign body.   Electromagnetic navigational bronchoscopy (ENB) on 12/16/2016 revealed non-small cell carcinoma, favor squamous cell lung carcinoma. Tumor was positive for p40 and negative for cdx-2 and TTF-1. He received SBRTto the left upper lobe of 5000 cGy in 5 fractions from 02/16/2017 - 03/02/2017.  EGDon 11/05/2016 revealed gastritis. Pathology confirmed chronic mild gastritis and features of a healing erosion in the stomach. There was no H pylori, dysplasia or malignancy. Colonoscopy on 12/03/2019revealedgrade II internal hemorrhoids.There was a single 3 mm polyp in the transverse colon(tubular adenoma).  He received 12 cycles of FOLFOXchemotherapy (03/15/2017 - 09/13/2017).   Chest, abdomen, and pelvisCTon 09/19/2018 revealed a markedly enlarged portacaval lymph node(4.2 x 2.5 x 4.8 cm; previously 2.3 x 1.0 x 1.9 cm)concerning for metastatic adenopathy.There were changes of external beam radiation within the left upper lobe. Increased nodular soft tissue attenuation within the radiation portwas nonspecific in may represent evolutionary changes of external beam radiation. Recurrent tumor would be difficult to exclude as thiswas a new finding from 10/11/2017.He has kidney stones.  PET  scanon 09/27/2018 revealed enlarged hypermetabolic periportal lymph nodes(2.6 cm node with SUV 12.9 and an adjacent 1.6 cm node with an SUV of 11.2)consistent with metastatic adenopathy.There was resolution of hypermetabolic nodularity in the left upper lobe with benign-appearing perihilar radiation change.There was no evidence of mediastinal metastatic adenopathy.He is s/pright hemicolectomy anatomywith no evidence of local recurrence.  [facility]2018 [facility] confirmedmetastatic adenocarcinomac/wclinical history of colorectal adenocarcinoma. IHCwas negative for CK7 and TTF-1 and positive for CK20, CDX-2, and SATB2.   CEAhas been followed: 1.4 on 11/09/2016, 3.9 on 10/14/2017, 2.0 on 01/18/2018, 1.7 on 04/21/2018, 2.4 on 07/19/2018, 2.8 on 09/19/2018, 4.0 on 12/26/2018, 6.7 on 02/14/2019, 9.1 on 05/15/2019, 9.2 on 08/24/2019, and 38.9 on 10/25/2019.  He has iron deficiency anemia. He is on oral iron. Ferritin was 6 on 09/04/2016. Hematocrit was 29.7 and hemoglobin 8.8 on 09/04/2016. Hematocrit was 28.1, hemoglobin 9.3, and MCV 87.9 on 11/09/2016.   Ferritinhas been followed: 28 on 10/13/2016, 15 on 01/14/2017, 21 on 03/29/2016, 20 on 04/12/2017, 24 on 04/26/2017, 49 on 07/12/2017, 50 on 08/02/2017, 48 on 08/16/2017, 37 on 01/18/2018, 42 on 04/21/2018, 44 on 07/19/2018, 33 on 02/14/2019, 32 on 05/15/2019, and 29 on 08/24/2019.  [place]  12/15/2016 revealed mild to moderate aortic valve stenosis. There was abnormal LV relaxation consistent with grade I diastolic dysfunction. Estimated EF was 55-60%.  PD-L1 testingwas 0%. Foundation One testing revealed KRAS G12A, and NRAS wildtype (codons 12, 13, 59, 61, 117 &146 in exons 2, 3, &4). BRAF was negative. MS-stable. TMB 1 Mutsz/Mb, APC I1417 fs*3, DIS3 amplification, MRE11A Q463f*5, SMAD4 R361H, SOX9 T248f10, and TP53 R273H.  Patient is not interested in the COVID-19 vaccine.   Symptomatically, he has lost 12  pounds.  Exam reveals scleral icterus, jaundice, and marked hepatomegaly.  Bilirubin is 11.2 (direct 7.1).  Plan: 1.   Labs today: CBC with diff, CMP, CEA, ferritin. 2.   Stage IV transverse colon cancer Clinically, he has rapid progression of disease.  Exam reveals jaundice and marked hepatomegaly suggestive of significant liver involvement.  Patient has previously not been interested in imaging or treatment of metastatic colon cancer (documented 10/2018).  Discuss imaging studies (CT or ultrasound) to confirm progressive disease.   Patient currently declines, but agrees to consider further after clinic.  Discuss patient's thoughts about therapy.   He does not wish treatment.  Discuss Hospice given limited life expectancy.   Multiple questions asked and answered.  Complete MOST form. 3.Stage I squamous cell carcinoma of the LEFT lung Patient has chronic shortness of breath. No imaging since 09/2018. 4.   Iron deficiency Hematocrit 31.3.Hemoglobin 10.1. MCV94.6.             Ferritin326 (suspect acute phase reactant). Patient notes hematuria with passage of kidney stones. 5.   Patient considering imaging (RUQ ultrasound or abdomen CT). 6.   RN to contact patient regarding decision about imaging. 7.   Begin paperwork for Hospice. 8.   RTC in 1 month for MD assessment.  I discussed the assessment and treatment plan with the patient.  The patient was provided an opportunity to ask questions and all were answered.  The patient agreed with the plan and demonstrated an understanding of the instructions.  The patient was advised to call back if the symptoms worsen or if the condition fails to improve as anticipated.  I provided 26 minutes of face-to-face time during this encounter and > 50% was spent counseling as documented under my assessment and plan.  An additional 10 minutes were spent reviewing his chart (Epic and Care Everywhere) including notes, labs, and  imaging studies.    MeLequita AsalMD, PhD    10/25/2019, 3:38 PM  I, EmMirian Moufford, am acting as scEducation administratoror MeCalpine CorporationCoMike GipMD, PhD.  I, Damani Rando C. CoMike GipMD, have reviewed the above documentation for accuracy and completeness, and I agree with the above.

## 2019-10-25 ENCOUNTER — Other Ambulatory Visit: Payer: Self-pay | Admitting: Hematology and Oncology

## 2019-10-25 ENCOUNTER — Other Ambulatory Visit: Payer: Self-pay

## 2019-10-25 ENCOUNTER — Inpatient Hospital Stay: Payer: Medicare Other | Attending: Hematology and Oncology | Admitting: Hematology and Oncology

## 2019-10-25 ENCOUNTER — Inpatient Hospital Stay: Payer: Medicare Other

## 2019-10-25 ENCOUNTER — Encounter: Payer: Self-pay | Admitting: Hematology and Oncology

## 2019-10-25 VITALS — BP 138/69 | HR 100 | Temp 98.8°F | Resp 18 | Ht 71.0 in | Wt 272.6 lb

## 2019-10-25 DIAGNOSIS — D509 Iron deficiency anemia, unspecified: Secondary | ICD-10-CM | POA: Diagnosis not present

## 2019-10-25 DIAGNOSIS — Z452 Encounter for adjustment and management of vascular access device: Secondary | ICD-10-CM | POA: Diagnosis not present

## 2019-10-25 DIAGNOSIS — R0602 Shortness of breath: Secondary | ICD-10-CM | POA: Insufficient documentation

## 2019-10-25 DIAGNOSIS — Z7189 Other specified counseling: Secondary | ICD-10-CM | POA: Diagnosis not present

## 2019-10-25 DIAGNOSIS — C3412 Malignant neoplasm of upper lobe, left bronchus or lung: Secondary | ICD-10-CM

## 2019-10-25 DIAGNOSIS — C182 Malignant neoplasm of ascending colon: Secondary | ICD-10-CM

## 2019-10-25 DIAGNOSIS — Z85118 Personal history of other malignant neoplasm of bronchus and lung: Secondary | ICD-10-CM | POA: Diagnosis not present

## 2019-10-25 DIAGNOSIS — C184 Malignant neoplasm of transverse colon: Secondary | ICD-10-CM

## 2019-10-25 DIAGNOSIS — D649 Anemia, unspecified: Secondary | ICD-10-CM

## 2019-10-25 DIAGNOSIS — R17 Unspecified jaundice: Secondary | ICD-10-CM

## 2019-10-25 DIAGNOSIS — Z95828 Presence of other vascular implants and grafts: Secondary | ICD-10-CM

## 2019-10-25 LAB — CBC WITH DIFFERENTIAL/PLATELET
Abs Immature Granulocytes: 0.05 10*3/uL (ref 0.00–0.07)
Basophils Absolute: 0.1 10*3/uL (ref 0.0–0.1)
Basophils Relative: 1 %
Eosinophils Absolute: 0.4 10*3/uL (ref 0.0–0.5)
Eosinophils Relative: 7 %
HCT: 31.3 % — ABNORMAL LOW (ref 39.0–52.0)
Hemoglobin: 10.1 g/dL — ABNORMAL LOW (ref 13.0–17.0)
Immature Granulocytes: 1 %
Lymphocytes Relative: 23 %
Lymphs Abs: 1.4 10*3/uL (ref 0.7–4.0)
MCH: 30.5 pg (ref 26.0–34.0)
MCHC: 32.3 g/dL (ref 30.0–36.0)
MCV: 94.6 fL (ref 80.0–100.0)
Monocytes Absolute: 0.4 10*3/uL (ref 0.1–1.0)
Monocytes Relative: 6 %
Neutro Abs: 3.8 10*3/uL (ref 1.7–7.7)
Neutrophils Relative %: 62 %
Platelets: 208 10*3/uL (ref 150–400)
RBC: 3.31 MIL/uL — ABNORMAL LOW (ref 4.22–5.81)
RDW: 18.2 % — ABNORMAL HIGH (ref 11.5–15.5)
WBC: 6 10*3/uL (ref 4.0–10.5)
nRBC: 0 % (ref 0.0–0.2)

## 2019-10-25 LAB — COMPREHENSIVE METABOLIC PANEL
ALT: 208 U/L — ABNORMAL HIGH (ref 0–44)
AST: 191 U/L — ABNORMAL HIGH (ref 15–41)
Albumin: 3 g/dL — ABNORMAL LOW (ref 3.5–5.0)
Alkaline Phosphatase: 458 U/L — ABNORMAL HIGH (ref 38–126)
Anion gap: 9 (ref 5–15)
BUN: 29 mg/dL — ABNORMAL HIGH (ref 8–23)
CO2: 19 mmol/L — ABNORMAL LOW (ref 22–32)
Calcium: 8.8 mg/dL — ABNORMAL LOW (ref 8.9–10.3)
Chloride: 105 mmol/L (ref 98–111)
Creatinine, Ser: 1.17 mg/dL (ref 0.61–1.24)
GFR calc Af Amer: >60 mL/min (ref >60)
GFR calc non Af Amer: >60 mL/min (ref >60)
Glucose, Bld: 108 mg/dL — ABNORMAL HIGH (ref 70–99)
Potassium: 3.8 mmol/L (ref 3.5–5.1)
Sodium: 133 mmol/L — ABNORMAL LOW (ref 135–145)
Total Bilirubin: 11.2 mg/dL — ABNORMAL HIGH (ref 0.3–1.2)
Total Protein: 7.4 g/dL (ref 6.5–8.1)

## 2019-10-25 LAB — BILIRUBIN, DIRECT: Bilirubin, Direct: 7.1 mg/dL — ABNORMAL HIGH (ref 0.0–0.2)

## 2019-10-25 LAB — FERRITIN: Ferritin: 326 ng/mL (ref 24–336)

## 2019-10-25 MED ORDER — HEPARIN SOD (PORK) LOCK FLUSH 100 UNIT/ML IV SOLN
500.0000 [IU] | Freq: Once | INTRAVENOUS | Status: AC
  Administered 2019-10-25: 500 [IU] via INTRAVENOUS
  Filled 2019-10-25: qty 5

## 2019-10-25 MED ORDER — SODIUM CHLORIDE 0.9% FLUSH
10.0000 mL | INTRAVENOUS | Status: DC | PRN
  Administered 2019-10-25: 10 mL via INTRAVENOUS
  Filled 2019-10-25: qty 10

## 2019-10-25 NOTE — Progress Notes (Signed)
The patient face and eyes are yellow today in clinic, the patient c./o blood discharge with urination x 1 month.

## 2019-10-27 ENCOUNTER — Telehealth: Payer: Self-pay

## 2019-10-27 LAB — CEA: CEA: 38.9 ng/mL — ABNORMAL HIGH (ref 0.0–4.7)

## 2019-10-27 NOTE — Telephone Encounter (Signed)
Spoke with Ms [name] about Mr Wimer to see if he want to do the PET Scan. With the PET scan the biggest concerns is can the dye be able to be filter out of his body with a bad liverand to see. would they have the option to do the [place] or CT scan. I have informed her per Dr Mike Gip note yes that is a option to either. Ms [name] states she will call back on Monday and inform us with what decision he would like to go with.

## 2019-10-30 ENCOUNTER — Telehealth: Payer: Self-pay

## 2019-10-30 NOTE — Telephone Encounter (Signed)
Can do a phone appt if desires to try to help. [facility]

## 2019-10-30 NOTE — Telephone Encounter (Signed)
[name] Event organiser) says that they are just calling in Hospice at this time for he does not have but about a couple months or so.

## 2019-10-30 NOTE — Telephone Encounter (Signed)
Copied from [place] [phone]. Topic: General - Inquiry >> Oct 27, 2019  5:03 PM Percell Belt A wrote: Reason for CRM: [name] shelton called and stated that pt cancer has spread to his liver and they are wanting to know It pt can be taken off some of his meds or what they need to do about the medS?   Best number [phone]

## 2019-11-01 ENCOUNTER — Inpatient Hospital Stay (HOSPITAL_BASED_OUTPATIENT_CLINIC_OR_DEPARTMENT_OTHER): Payer: Medicare Other | Admitting: Hospice and Palliative Medicine

## 2019-11-01 ENCOUNTER — Telehealth: Payer: Self-pay | Admitting: *Deleted

## 2019-11-01 ENCOUNTER — Other Ambulatory Visit: Payer: Self-pay

## 2019-11-01 DIAGNOSIS — Z515 Encounter for palliative care: Secondary | ICD-10-CM

## 2019-11-01 DIAGNOSIS — G893 Neoplasm related pain (acute) (chronic): Secondary | ICD-10-CM | POA: Diagnosis not present

## 2019-11-01 DIAGNOSIS — C182 Malignant neoplasm of ascending colon: Secondary | ICD-10-CM

## 2019-11-01 DIAGNOSIS — Z7189 Other specified counseling: Secondary | ICD-10-CM

## 2019-11-01 MED ORDER — OXYCODONE HCL 5 MG PO TABS: 2.5000 mg | ORAL_TABLET | Freq: Three times a day (TID) | ORAL | 0 refills | Status: AC | PRN

## 2019-11-01 NOTE — Telephone Encounter (Signed)
[name] called stating that patient needs help. He is in pain and a hospice referral was to be made Monday and she has not heard from them yet. Please advise

## 2019-11-01 NOTE — Telephone Encounter (Signed)
I discussed with Orbie Pyo , CMA and with Sharion Dove, NP. Patient will have a video visit at 31 with Sharion Dove, NP, [name] will be there to coordinate call with patient/ wife.

## 2019-11-01 NOTE — Progress Notes (Signed)
Virtual Visit via Video Note  I connected with Donald Mcdowell. on 11/01/19 at 11:30 AM EDT by a video enabled telemedicine application and verified that I am speaking with the correct person using two identifiers.   I discussed the limitations of evaluation and management by telemedicine and the availability of in person appointments. The patient expressed understanding and agreed to proceed.  History of Present Illness: Donald Mcdowell is a 74 year old man with multiple medical problems including squamous cell lung cancer status post SBRT and stage IV colon cancer who was last seen by Dr. Mike Gip on 10/25/2019 was found to have mild transaminitis thought secondary to metastatic disease to the liver.  Given disease progression patient opted to pursue hospice care.  He was an add-on to my clinic schedule today for management of pain.   Observations/Objective: I spoke with patient via video with his caregiver, Donald Mcdowell.  Patient states that he has intermittent right flank and abdominal pain, which could be capsular pain related to his liver metastasis.  Pain is worse at night causing insomnia.  He has been taking 1500 mg of acetaminophen twice daily.  He says that the acetaminophen helps but is short-lived.  Discussed and recommended limiting acetaminophen use given his existing transaminitis.  Instead, we will start low-dose oxycodone every 8 hours as needed.  It may help patient to take a dose at bedtime to help him rest.  We discussed safe use and administration of opioids and management of opioid-induced constipation.  Patient confirmed that it is his desire to stay at home and focus on comfort care.  He would be interested in having hospice follow at home.  I called and gave an order for hospice to [facility].   Patient states that he is not interested in future hospitalization.  Again he reiterates it is his goal to just focus on comfort care.  He would not want to be resuscitated nor have  his life prolonged artificially machines.  I signed a DNR order and left it at the front desk for family to pick up.   I completed a MOST form today via Vyn[employer]. The patient and family outlined their wishes for the following treatment decisions:  Cardiopulmonary Resuscitation: Do Not Attempt Resuscitation (DNR/No CPR)  Medical Interventions: Comfort Measures: Keep clean, warm, and dry. Use medication by any route, positioning, wound care, and other measures to relieve pain and suffering. Use oxygen, suction and manual treatment of airway obstruction as needed for comfort. Do not transfer to the hospital unless comfort needs cannot be met in current location.  Antibiotics: Determine use of limitation of antibiotics when infection occurs  IV Fluids: No IV fluids (provide other measures to ensure comfort)  Feeding Tube: No feeding tube     Assessment and Plan: Stage IV colorectal cancer -patient declines further treatment or work-up.  Will refer to hospice  Neoplasm related pain -recommend limiting acetaminophen.  Will start oxycodone 2.5 to 5 mg every 8 hours as needed.  Can liberalize dosing/frequency if needed.  Could also consider low-dose steroids for capsular pain.  Start prophylactic bowel regimen.  ACP -MOST form completed.  DNR order signed and left at the front desk for family to pick up  Follow Up Instructions: As needed   I discussed the assessment and treatment plan with the patient. The patient was provided an opportunity to ask questions and all were answered. The patient agreed with the plan and demonstrated an understanding of the instructions.   The patient  was advised to call back or seek an in-person evaluation if the symptoms worsen or if the condition fails to improve as anticipated.  I provided 30 minutes of non-face-to-face time during this encounter.   Irean Hong, NP

## 2019-11-04 DIAGNOSIS — R17 Unspecified jaundice: Secondary | ICD-10-CM | POA: Insufficient documentation

## 2019-11-16 ENCOUNTER — Other Ambulatory Visit: Payer: Medicare Other

## 2019-11-16 ENCOUNTER — Ambulatory Visit: Payer: Medicare Other | Admitting: Hematology and Oncology

## 2019-11-21 ENCOUNTER — Ambulatory Visit: Payer: Medicare Other | Admitting: Hematology and Oncology

## 2019-12-15 DEATH — deceased

## 2020-03-12 ENCOUNTER — Ambulatory Visit: Payer: Medicare Other | Admitting: Internal Medicine
